# Patient Record
Sex: Female | Born: 1955
Health system: Southern US, Community
[De-identification: ages and names within clinical notes are randomized; demographics above are authoritative.]

## PROBLEM LIST (undated history)

## (undated) DIAGNOSIS — F32A Depression, unspecified: Secondary | ICD-10-CM

## (undated) DIAGNOSIS — R011 Cardiac murmur, unspecified: Secondary | ICD-10-CM

## (undated) DIAGNOSIS — E079 Disorder of thyroid, unspecified: Secondary | ICD-10-CM

## (undated) DIAGNOSIS — K219 Gastro-esophageal reflux disease without esophagitis: Secondary | ICD-10-CM

## (undated) DIAGNOSIS — I10 Essential (primary) hypertension: Secondary | ICD-10-CM

## (undated) DIAGNOSIS — E119 Type 2 diabetes mellitus without complications: Secondary | ICD-10-CM

## (undated) DIAGNOSIS — T7840XA Allergy, unspecified, initial encounter: Secondary | ICD-10-CM

## (undated) DIAGNOSIS — E785 Hyperlipidemia, unspecified: Secondary | ICD-10-CM

## (undated) DIAGNOSIS — F329 Major depressive disorder, single episode, unspecified: Secondary | ICD-10-CM

## (undated) HISTORY — DX: Major depressive disorder, single episode, unspecified: F32.9

## (undated) HISTORY — DX: Cardiac murmur, unspecified: R01.1

## (undated) HISTORY — PX: NO PAST SURGERIES: SHX2092

## (undated) HISTORY — DX: Hyperlipidemia, unspecified: E78.5

## (undated) HISTORY — DX: Allergy, unspecified, initial encounter: T78.40XA

## (undated) HISTORY — DX: Gastro-esophageal reflux disease without esophagitis: K21.9

## (undated) HISTORY — DX: Depression, unspecified: F32.A

---

## 2002-02-21 ENCOUNTER — Encounter (INDEPENDENT_AMBULATORY_CARE_PROVIDER_SITE_OTHER): Payer: Self-pay | Admitting: *Deleted

## 2004-10-01 ENCOUNTER — Emergency Department (HOSPITAL_COMMUNITY): Admission: EM | Admit: 2004-10-01 | Discharge: 2004-10-01 | Payer: Self-pay | Admitting: Family Medicine

## 2004-10-04 ENCOUNTER — Ambulatory Visit: Payer: Self-pay | Admitting: Family Medicine

## 2004-11-17 ENCOUNTER — Ambulatory Visit: Payer: Self-pay | Admitting: Family Medicine

## 2006-04-20 DIAGNOSIS — E119 Type 2 diabetes mellitus without complications: Secondary | ICD-10-CM

## 2006-04-20 DIAGNOSIS — I10 Essential (primary) hypertension: Secondary | ICD-10-CM | POA: Insufficient documentation

## 2006-04-21 ENCOUNTER — Encounter (INDEPENDENT_AMBULATORY_CARE_PROVIDER_SITE_OTHER): Payer: Self-pay | Admitting: *Deleted

## 2007-10-26 ENCOUNTER — Ambulatory Visit: Payer: Self-pay | Admitting: Nurse Practitioner

## 2007-10-26 DIAGNOSIS — E7800 Pure hypercholesterolemia, unspecified: Secondary | ICD-10-CM | POA: Insufficient documentation

## 2007-10-26 DIAGNOSIS — E039 Hypothyroidism, unspecified: Secondary | ICD-10-CM

## 2007-10-26 DIAGNOSIS — R011 Cardiac murmur, unspecified: Secondary | ICD-10-CM | POA: Insufficient documentation

## 2007-10-26 LAB — CONVERTED CEMR LAB
Bilirubin Urine: NEGATIVE
Blood Glucose, Fingerstick: 243
Blood in Urine, dipstick: NEGATIVE
HDL goal, serum: 40 mg/dL
Ketones, urine, test strip: NEGATIVE
Microalb, Ur: 0.21 mg/dL (ref 0.00–1.89)
Protein, U semiquant: NEGATIVE
Specific Gravity, Urine: <1.005
Urobilinogen, UA: 0.2
WBC Urine, dipstick: NEGATIVE
pH: 7

## 2007-10-30 LAB — CONVERTED CEMR LAB
AST: 32 units/L (ref 0–37)
Albumin: 4.7 g/dL (ref 3.5–5.2)
Alkaline Phosphatase: 149 units/L — ABNORMAL HIGH (ref 39–117)
BUN: 15 mg/dL (ref 6–23)
Basophils Absolute: 0 10*3/uL (ref 0.0–0.1)
Basophils Relative: 0 % (ref 0–1)
Calcium: 9.9 mg/dL (ref 8.4–10.5)
Creatinine, Ser: 0.82 mg/dL (ref 0.40–1.20)
Hgb A1c MFr Bld: 8.9 % — ABNORMAL HIGH (ref 4.6–6.1)
Lymphocytes Relative: 27 % (ref 12–46)
MCHC: 34.7 g/dL (ref 30.0–36.0)
Monocytes Absolute: 0.5 10*3/uL (ref 0.1–1.0)
Neutro Abs: 7.2 10*3/uL (ref 1.7–7.7)
Neutrophils Relative %: 66 % (ref 43–77)
RBC: 4.78 M/uL (ref 3.87–5.11)
RDW: 13.4 % (ref 11.5–15.5)
TSH: 18.726 microintl units/mL — ABNORMAL HIGH (ref 0.350–4.50)
Total Bilirubin: 0.5 mg/dL (ref 0.3–1.2)
Total Protein: 8.5 g/dL — ABNORMAL HIGH (ref 6.0–8.3)
WBC: 10.9 10*3/uL — ABNORMAL HIGH (ref 4.0–10.5)

## 2007-11-05 ENCOUNTER — Ambulatory Visit: Payer: Self-pay | Admitting: *Deleted

## 2007-11-21 ENCOUNTER — Ambulatory Visit: Payer: Self-pay | Admitting: Nurse Practitioner

## 2007-11-21 DIAGNOSIS — R21 Rash and other nonspecific skin eruption: Secondary | ICD-10-CM

## 2007-11-21 LAB — CONVERTED CEMR LAB: Blood Glucose, Fingerstick: 246

## 2007-12-24 ENCOUNTER — Ambulatory Visit: Payer: Self-pay | Admitting: Nurse Practitioner

## 2007-12-25 ENCOUNTER — Ambulatory Visit: Payer: Self-pay | Admitting: Nurse Practitioner

## 2007-12-25 LAB — CONVERTED CEMR LAB
LDL Cholesterol: 91 mg/dL (ref 0–99)
Triglycerides: 167 mg/dL — ABNORMAL HIGH (ref <150)
VLDL: 33 mg/dL (ref 0–40)

## 2008-01-01 ENCOUNTER — Encounter (INDEPENDENT_AMBULATORY_CARE_PROVIDER_SITE_OTHER): Payer: Self-pay | Admitting: Ophthalmology

## 2008-01-01 ENCOUNTER — Ambulatory Visit: Admission: RE | Admit: 2008-01-01 | Discharge: 2008-01-01 | Payer: Self-pay | Admitting: Family Medicine

## 2008-01-08 ENCOUNTER — Telehealth (INDEPENDENT_AMBULATORY_CARE_PROVIDER_SITE_OTHER): Payer: Self-pay | Admitting: Nurse Practitioner

## 2008-01-27 ENCOUNTER — Encounter (INDEPENDENT_AMBULATORY_CARE_PROVIDER_SITE_OTHER): Payer: Self-pay | Admitting: Nurse Practitioner

## 2008-01-27 LAB — CONVERTED CEMR LAB

## 2008-02-07 ENCOUNTER — Encounter (INDEPENDENT_AMBULATORY_CARE_PROVIDER_SITE_OTHER): Payer: Self-pay | Admitting: *Deleted

## 2008-02-26 ENCOUNTER — Ambulatory Visit: Payer: Self-pay | Admitting: Nurse Practitioner

## 2008-02-26 DIAGNOSIS — N3000 Acute cystitis without hematuria: Secondary | ICD-10-CM | POA: Insufficient documentation

## 2008-02-26 LAB — CONVERTED CEMR LAB
Bilirubin Urine: NEGATIVE
Blood Glucose, Fingerstick: 168
Glucose, Urine, Semiquant: NEGATIVE
Hgb A1c MFr Bld: 6.3 %
Ketones, urine, test strip: NEGATIVE
Protein, U semiquant: NEGATIVE

## 2008-02-27 ENCOUNTER — Encounter (INDEPENDENT_AMBULATORY_CARE_PROVIDER_SITE_OTHER): Payer: Self-pay | Admitting: Nurse Practitioner

## 2008-02-27 LAB — CONVERTED CEMR LAB
ALT: 16 units/L (ref 0–35)
AST: 19 units/L (ref 0–37)
HDL: 51 mg/dL (ref >39)
LDL Cholesterol: 95 mg/dL (ref 0–99)
TSH: 3.947 microintl units/mL (ref 0.350–4.50)
Total Bilirubin: 0.5 mg/dL (ref 0.3–1.2)
Total CHOL/HDL Ratio: 4.2
Triglycerides: 333 mg/dL — ABNORMAL HIGH (ref <150)
VLDL: 67 mg/dL — ABNORMAL HIGH (ref 0–40)

## 2008-02-29 ENCOUNTER — Telehealth (INDEPENDENT_AMBULATORY_CARE_PROVIDER_SITE_OTHER): Payer: Self-pay | Admitting: Nurse Practitioner

## 2008-03-10 ENCOUNTER — Ambulatory Visit: Payer: Self-pay | Admitting: Nurse Practitioner

## 2008-05-12 ENCOUNTER — Telehealth (INDEPENDENT_AMBULATORY_CARE_PROVIDER_SITE_OTHER): Payer: Self-pay | Admitting: Nurse Practitioner

## 2008-05-26 ENCOUNTER — Ambulatory Visit: Payer: Self-pay | Admitting: Nurse Practitioner

## 2008-05-26 LAB — CONVERTED CEMR LAB
Blood Glucose, Fingerstick: 159
Hgb A1c MFr Bld: 6.5 %

## 2008-06-06 ENCOUNTER — Ambulatory Visit (HOSPITAL_COMMUNITY): Admission: RE | Admit: 2008-06-06 | Discharge: 2008-06-06 | Payer: Self-pay | Admitting: Family Medicine

## 2008-07-07 ENCOUNTER — Ambulatory Visit: Payer: Self-pay | Admitting: Nurse Practitioner

## 2008-07-07 LAB — CONVERTED CEMR LAB
ALT: 9 units/L (ref 0–35)
Cholesterol: 144 mg/dL (ref 0–200)
Indirect Bilirubin: 0.3 mg/dL (ref 0.0–0.9)
LDL Cholesterol: 70 mg/dL (ref 0–99)
Total Bilirubin: 0.4 mg/dL (ref 0.3–1.2)
Total CHOL/HDL Ratio: 3
VLDL: 26 mg/dL (ref 0–40)

## 2008-07-08 ENCOUNTER — Encounter (INDEPENDENT_AMBULATORY_CARE_PROVIDER_SITE_OTHER): Payer: Self-pay | Admitting: Nurse Practitioner

## 2008-07-25 ENCOUNTER — Telehealth (INDEPENDENT_AMBULATORY_CARE_PROVIDER_SITE_OTHER): Payer: Self-pay | Admitting: Nurse Practitioner

## 2008-08-21 ENCOUNTER — Ambulatory Visit: Payer: Self-pay | Admitting: Nurse Practitioner

## 2008-11-03 ENCOUNTER — Telehealth (INDEPENDENT_AMBULATORY_CARE_PROVIDER_SITE_OTHER): Payer: Self-pay | Admitting: Nurse Practitioner

## 2008-12-01 ENCOUNTER — Encounter (INDEPENDENT_AMBULATORY_CARE_PROVIDER_SITE_OTHER): Payer: Self-pay | Admitting: *Deleted

## 2008-12-25 ENCOUNTER — Ambulatory Visit: Payer: Self-pay | Admitting: Nurse Practitioner

## 2008-12-25 DIAGNOSIS — E669 Obesity, unspecified: Secondary | ICD-10-CM | POA: Insufficient documentation

## 2008-12-25 LAB — CONVERTED CEMR LAB
ALT: 15 units/L (ref 0–35)
AST: 20 units/L (ref 0–37)
Albumin: 5 g/dL (ref 3.5–5.2)
BUN: 10 mg/dL (ref 6–23)
Blood Glucose, Fingerstick: 152
CO2: 23 meq/L (ref 19–32)
Cholesterol: 181 mg/dL (ref 0–200)
Creatinine, Ser: 0.57 mg/dL (ref 0.40–1.20)
Glucose, Bld: 133 mg/dL — ABNORMAL HIGH (ref 70–99)
Hgb A1c MFr Bld: 6.7 %
LDL Cholesterol: 80 mg/dL (ref 0–99)
Sodium: 142 meq/L (ref 135–145)
Total Bilirubin: 0.6 mg/dL (ref 0.3–1.2)
Total CHOL/HDL Ratio: 3.9
VLDL: 55 mg/dL — ABNORMAL HIGH (ref 0–40)

## 2008-12-26 ENCOUNTER — Encounter (INDEPENDENT_AMBULATORY_CARE_PROVIDER_SITE_OTHER): Payer: Self-pay | Admitting: Nurse Practitioner

## 2009-03-13 ENCOUNTER — Ambulatory Visit: Payer: Self-pay | Admitting: Nurse Practitioner

## 2009-03-27 ENCOUNTER — Ambulatory Visit: Payer: Self-pay | Admitting: Nurse Practitioner

## 2009-03-27 LAB — CONVERTED CEMR LAB
Basophils Absolute: 0 10*3/uL (ref 0.0–0.1)
Basophils Relative: 0 % (ref 0–1)
Blood Glucose, Fingerstick: 154
Chlamydia, DNA Probe: NEGATIVE
Eosinophils Absolute: 0.1 10*3/uL (ref 0.0–0.7)
Eosinophils Relative: 1 % (ref 0–5)
Glucose, Urine, Semiquant: NEGATIVE
HCT: 42.3 % (ref 36.0–46.0)
HDL: 46 mg/dL (ref >39)
Hgb A1c MFr Bld: 7.2 %
Ketones, urine, test strip: NEGATIVE
LDL Cholesterol: 82 mg/dL (ref 0–99)
Lymphocytes Relative: 25 % (ref 12–46)
Lymphs Abs: 1.9 10*3/uL (ref 0.7–4.0)
MCHC: 33.8 g/dL (ref 30.0–36.0)
MCV: 88.5 fL (ref 78.0–100.0)
Monocytes Relative: 4 % (ref 3–12)
Neutrophils Relative %: 69 % (ref 43–77)
Nitrite: NEGATIVE
Triglycerides: 159 mg/dL — ABNORMAL HIGH (ref <150)
VLDL: 32 mg/dL (ref 0–40)

## 2009-04-01 ENCOUNTER — Encounter (INDEPENDENT_AMBULATORY_CARE_PROVIDER_SITE_OTHER): Payer: Self-pay | Admitting: Nurse Practitioner

## 2009-05-29 ENCOUNTER — Ambulatory Visit: Payer: Self-pay | Admitting: Nurse Practitioner

## 2009-05-29 DIAGNOSIS — J309 Allergic rhinitis, unspecified: Secondary | ICD-10-CM | POA: Insufficient documentation

## 2009-06-08 ENCOUNTER — Ambulatory Visit (HOSPITAL_COMMUNITY): Admission: RE | Admit: 2009-06-08 | Discharge: 2009-06-08 | Payer: Self-pay | Admitting: Internal Medicine

## 2009-07-31 ENCOUNTER — Ambulatory Visit: Payer: Self-pay | Admitting: Nurse Practitioner

## 2009-09-07 ENCOUNTER — Telehealth (INDEPENDENT_AMBULATORY_CARE_PROVIDER_SITE_OTHER): Payer: Self-pay | Admitting: Nurse Practitioner

## 2009-10-08 ENCOUNTER — Telehealth (INDEPENDENT_AMBULATORY_CARE_PROVIDER_SITE_OTHER): Payer: Self-pay | Admitting: Nurse Practitioner

## 2009-11-06 ENCOUNTER — Ambulatory Visit: Payer: Self-pay | Admitting: Nurse Practitioner

## 2009-11-06 LAB — CONVERTED CEMR LAB
Blood Glucose, Fingerstick: 120
Microalb, Ur: 0.5 mg/dL (ref 0.00–1.89)
Nitrite: NEGATIVE
Protein, U semiquant: NEGATIVE
Specific Gravity, Urine: 1.01
Urobilinogen, UA: 0.2
WBC Urine, dipstick: NEGATIVE
pH: 6.5

## 2009-12-08 ENCOUNTER — Ambulatory Visit: Payer: Self-pay | Admitting: Nurse Practitioner

## 2010-02-05 ENCOUNTER — Telehealth (INDEPENDENT_AMBULATORY_CARE_PROVIDER_SITE_OTHER): Payer: Self-pay | Admitting: Nurse Practitioner

## 2010-02-10 ENCOUNTER — Ambulatory Visit: Payer: Self-pay | Admitting: Nurse Practitioner

## 2010-02-10 ENCOUNTER — Encounter (INDEPENDENT_AMBULATORY_CARE_PROVIDER_SITE_OTHER): Payer: Self-pay | Admitting: Nurse Practitioner

## 2010-02-11 ENCOUNTER — Encounter (INDEPENDENT_AMBULATORY_CARE_PROVIDER_SITE_OTHER): Payer: Self-pay | Admitting: Nurse Practitioner

## 2010-02-11 LAB — CONVERTED CEMR LAB
Albumin: 4.7 g/dL (ref 3.5–5.2)
BUN: 15 mg/dL (ref 6–23)
Basophils Absolute: 0 10*3/uL (ref 0.0–0.1)
Basophils Relative: 0 % (ref 0–1)
Cholesterol: 179 mg/dL (ref 0–200)
Eosinophils Absolute: 0.2 10*3/uL (ref 0.0–0.7)
HDL: 50 mg/dL (ref >39)
Lymphocytes Relative: 32 % (ref 12–46)
MCV: 89.7 fL (ref 78.0–100.0)
Monocytes Absolute: 0.3 10*3/uL (ref 0.1–1.0)
Monocytes Relative: 5 % (ref 3–12)
Neutro Abs: 4.6 10*3/uL (ref 1.7–7.7)
Platelets: 222 10*3/uL (ref 150–400)
RDW: 13.3 % (ref 11.5–15.5)
Sodium: 139 meq/L (ref 135–145)
TSH: 1.585 microintl units/mL (ref 0.350–4.500)
Total Protein: 8 g/dL (ref 6.0–8.3)
Triglycerides: 203 mg/dL — ABNORMAL HIGH (ref <150)

## 2010-03-23 NOTE — Letter (Signed)
Summary: Handout Printed  Printed Handout:  - Eczema (Atopic Dermatitis) 

## 2010-03-23 NOTE — Assessment & Plan Note (Signed)
Summary: Diabetes/HTN   Vital Signs:  Patient profile:   55 year old female Menstrual status:  postmenopausal Weight:      169.0 pounds BMI:     34.26 BSA:     1.72 Temp:     97.3 degrees F oral Pulse rate:   91 / minute Pulse rhythm:   regular Resp:     20 per minute BP sitting:   126 / 85  (left arm) Cuff size:   regular  Vitals Entered By: Levon Hedger (March 13, 2009 3:45 PM) CC: needs refill on cholesterol medication....rash on left arm, neck x 3-4 days has been using some ointment but not sure if that is the right medication, Lipid Management, Hypertension Management Is Patient Diabetic? Yes CBG Result 143 CBG Device ID A  Does patient need assistance? Functional Status Self care Ambulation Normal   CC:  needs refill on cholesterol medication....rash on left arm, neck x 3-4 days has been using some ointment but not sure if that is the right medication, Lipid Management, and Hypertension Management.  History of Present Illness:  Pt into the office for follow up.  Rash - present on left inner elbow and neck. Started 3 days ago she has been using some mupironcin ointment (a friend's) +puritis Denies any irritants such as change in soap, detergents   No medications present today with pt.  Advised pt to bring all meds to this office on each visit  Spanish interpreter present in office with pt  Hypertension History:      She denies headache, chest pain, and palpitations.  She notes no problems with any antihypertensive medication side effects.        Positive major cardiovascular risk factors include diabetes, hyperlipidemia, and hypertension.  Negative major cardiovascular risk factors include female age less than 22 years old, negative family history for ischemic heart disease, and non-tobacco-user status.        Further assessment for target organ damage reveals no history of ASHD, cardiac end-organ damage (CHF/LVH), stroke/TIA, peripheral vascular disease,  renal insufficiency, or hypertensive retinopathy.    Lipid Management History:      Positive NCEP/ATP III risk factors include diabetes and hypertension.  Negative NCEP/ATP III risk factors include female age less than 25 years old, no family history for ischemic heart disease, non-tobacco-user status, no ASHD (atherosclerotic heart disease), no prior stroke/TIA, no peripheral vascular disease, and no history of aortic aneurysm.        The patient states that she knows about the "Therapeutic Lifestyle Change" diet.  Her compliance with the TLC diet is fair.  The patient does not know about adjunctive measures for cholesterol lowering.  She expresses no side effects from her lipid-lowering medication.  The patient denies any symptoms to suggest myopathy or liver disease.      Allergies: No Known Drug Allergies  Review of Systems ENT:  Denies earache. CV:  Denies chest pain or discomfort. Resp:  Denies cough. GI:  Denies abdominal pain, nausea, and vomiting. Derm:  Complains of rash; left inner arm  and right neck.  Physical Exam  General:  alert.   Head:  normocephalic.   Msk:  up to the exam table Neurologic:  alert & oriented X3.   Skin:  right inner arm, right neck with raised erythematous papules Psych:  Oriented X3.     Impression & Recommendations:  Problem # 1:  DIABETES MELLITUS II, UNCOMPLICATED (ICD-250.00)  Her updated medication list for this problem includes:  Metformin Hcl 500 Mg Tabs (Metformin hcl) ..... One tablet by mouth two times a day    Lisinopril-hydrochlorothiazide 10-12.5 Mg Tabs (Lisinopril-hydrochlorothiazide) ..... One tablet by mouth daily for blood pressure    Aspirin Low Strength 81 Mg Chew (Aspirin) ..... One tablet by mouth daily  Orders: Capillary Blood Glucose/CBG (29562)  Problem # 2:  HYPERTENSION, BENIGN SYSTEMIC (ICD-401.1)  Her updated medication list for this problem includes:    Lisinopril-hydrochlorothiazide 10-12.5 Mg Tabs  (Lisinopril-hydrochlorothiazide) ..... One tablet by mouth daily for blood pressure  Problem # 3:  HYPOTHYROIDISM (ICD-244.9)  Her updated medication list for this problem includes:    Levothroid 125 Mcg Tabs (Levothyroxine sodium) .Marland Kitchen... 1 tablet by mouth daily for thyroid  Problem # 4:  SKIN RASH (ICD-782.1)  Her updated medication list for this problem includes:    Triamcinolone Acetonide 0.1 % Oint (Triamcinolone acetonide) .Marland Kitchen... 1 application topically to affected area  Complete Medication List: 1)  Metformin Hcl 500 Mg Tabs (Metformin hcl) .... One tablet by mouth two times a day 2)  Levothroid 125 Mcg Tabs (Levothyroxine sodium) .Marland Kitchen.. 1 tablet by mouth daily for thyroid 3)  Lisinopril-hydrochlorothiazide 10-12.5 Mg Tabs (Lisinopril-hydrochlorothiazide) .... One tablet by mouth daily for blood pressure 4)  Glucometer Elite Classic Kit (Blood glucose monitoring suppl) .... Dispense meter, test strips and lancets check blood sugar daily dx:250.00 5)  Triamcinolone Acetonide 0.1 % Oint (Triamcinolone acetonide) .Marland Kitchen.. 1 application topically to affected area 6)  Pravastatin Sodium 40 Mg Tabs (Pravastatin sodium) .Marland Kitchen.. 1 tablet by mouth nightly 7)  Sidekick Blood Glucose System Devi (Blood gluc meter disp-strips) .... Dispense test strips to use with current glucometer check blood sugar daily before breakfast  dx 250.00 8)  Calcium 600+d 600-400 Mg-unit Tabs (Calcium carbonate-vitamin d) .Marland Kitchen.. 1 tablet by mouth two times a day 9)  Blood Glucose Test Strp (Glucose blood) .... Use with glucometer elite to check blood sugar once daily dx:250.00 10)  Aspirin Low Strength 81 Mg Chew (Aspirin) .... One tablet by mouth daily  Hypertension Assessment/Plan:      The patient's hypertensive risk group is category C: Target organ damage and/or diabetes.  Her calculated 10 year risk of coronary heart disease is 11 %.  Today's blood pressure is 126/85.  Her blood pressure goal is < 130/80.  Lipid  Assessment/Plan:      Based on NCEP/ATP III, the patient's risk factor category is "history of diabetes".  The patient's lipid goals are as follows: Total cholesterol goal is 200; LDL cholesterol goal is 100; HDL cholesterol goal is 40; Triglyceride goal is 150.    Patient Instructions: 1)  Keep your appointment for a complete physical exam. 2)  Do not eat after midnight before this visit. Prescriptions: PRAVASTATIN SODIUM 40 MG TABS (PRAVASTATIN SODIUM) 1 tablet by mouth nightly  #30 x 6   Entered and Authorized by:   Lehman Prom FNP   Signed by:   Lehman Prom FNP on 03/13/2009   Method used:   Print then Give to Patient   RxID:   [id number] LISINOPRIL-HYDROCHLOROTHIAZIDE 10-12.5 MG TABS (LISINOPRIL-HYDROCHLOROTHIAZIDE) One tablet by mouth daily for blood pressure  #30 x 6   Entered and Authorized by:   Lehman Prom FNP   Signed by:   Lehman Prom FNP on 03/13/2009   Method used:   Print then Give to Patient   RxID:   [id number] LEVOTHROID 125 MCG TABS (LEVOTHYROXINE SODIUM) 1 tablet by mouth daily for thyroid  #30 Each x 6  Entered and Authorized by:   Lehman Prom FNP   Signed by:   Lehman Prom FNP on 03/13/2009   Method used:   Print then Give to Patient   RxID:   [id number] METFORMIN HCL 500 MG TABS (METFORMIN HCL) One tablet by mouth two times a day  #60 Each x 6   Entered and Authorized by:   Lehman Prom FNP   Signed by:   Lehman Prom FNP on 03/13/2009   Method used:   Print then Give to Patient   RxID:   [id number] TRIAMCINOLONE ACETONIDE 0.1 % OINT (TRIAMCINOLONE ACETONIDE) 1 application topically to affected area  #60gm x 0   Entered and Authorized by:   Lehman Prom FNP   Signed by:   Lehman Prom FNP on 03/13/2009   Method used:   Print then Give to Patient   RxID:   [id number]   Diabetic Foot Exam Foot Inspection Is there a history of a foot ulcer?              No Is there a foot ulcer now?               No Can the patient see the bottom of their feet?          Yes Are the shoes appropriate in style and fit?          Yes Is there swelling or an abnormal foot shape?          No Are the toenails long?                No Are the toenails thick?                No Are the toenails ingrown?              No Is there heavy callous build-up?              No Is there pain in the calf muscle (Intermittent claudication) when walking?    NoIs there a claw toe deformity?              No Is there elevated skin temperature?            No Is there limited ankle dorsiflexion?            No Is there foot or ankle muscle weakness?            No  Diabetic Foot Care Education Pulse Check          Right Foot          Left Foot Dorsalis Pedis:        normal            normal

## 2010-03-23 NOTE — Assessment & Plan Note (Signed)
Summary: Acute - Allergic Rhinitis   Vital Signs:  Patient profile:   55 year old female Menstrual status:  postmenopausal Weight:      168.9 pounds BMI:     34.24 BSA:     1.72 Temp:     98.0 degrees F oral Pulse rate:   81 / minute Pulse rhythm:   regular Resp:     20 per minute BP sitting:   129 / 83  (left arm) Cuff size:   regular  Vitals Entered By: Levon Hedger (May 29, 2009 11:46 AM) CC: chest congestion, runny nose, URI symptoms  Does patient need assistance? Functional Status Self care Ambulation Normal   CC:  chest congestion, runny nose, and URI symptoms.  History of Present Illness:  Pt into the office with complaints of upper respiratory symptoms       This is a 55 year old woman who presents with URI symptoms.  The symptoms began 4 days ago.  The patient complains of nasal congestion and dry cough, but denies sore throat and earache.  The patient denies fever.  The patient denies sneezing and headache.    Son present with pt today and reports that he had similar symptoms on last week  Mammogram due - pt is requesting to get this scheduled today  Allergies: No Known Drug Allergies  Review of Systems General:  Denies fever. ENT:  Complains of nasal congestion. CV:  Denies chest pain or discomfort. Resp:  Complains of cough.  Physical Exam  General:  alert.   Head:  normocephalic.   Eyes:  injected conjunctiva Ears:  left ear with moderate cerumen right ear with able to visualize bony landmarkds Nose:  inflammed turbinates Mouth:  fair dentition.   Lungs:  normal breath sounds.   Heart:  normal rate and regular rhythm.   Msk:  up to the exam table Neurologic:  alert & oriented X3.   Skin:  color normal.   Psych:  Oriented X3.     Impression & Recommendations:  Problem # 1:  ALLERGIC RHINITIS (ICD-477.9) advised pt of the dx veramyst sample given to pt will also start loratadine Her updated medication list for this problem  includes:    Loratadine 10 Mg Tabs (Loratadine) ..... One tablet by mouth daily for allergies    Veramyst 27.5 Mcg/spray Susp (Fluticasone furoate) ..... One spray in each nostril two times a day  Problem # 2:  UNSPECIFIED BREAST SCREENING (ICD-V76.10) will order mammogram - no current complaints, just time to schedule routine Orders: Mammogram (Screening) (Mammo)  Complete Medication List: 1)  Metformin Hcl 500 Mg Tabs (Metformin hcl) .... One tablet by mouth two times a day 2)  Levothroid 125 Mcg Tabs (Levothyroxine sodium) .Marland Kitchen.. 1 tablet by mouth daily for thyroid 3)  Lisinopril-hydrochlorothiazide 10-12.5 Mg Tabs (Lisinopril-hydrochlorothiazide) .... One tablet by mouth daily for blood pressure 4)  Glucometer Elite Classic Kit (Blood glucose monitoring suppl) .... Dispense meter, test strips and lancets check blood sugar daily dx:250.00 5)  Triamcinolone Acetonide 0.1 % Oint (Triamcinolone acetonide) .Marland Kitchen.. 1 application topically to affected area 6)  Pravastatin Sodium 40 Mg Tabs (Pravastatin sodium) .Marland Kitchen.. 1 tablet by mouth nightly 7)  Sidekick Blood Glucose System Devi (Blood gluc meter disp-strips) .... Dispense test strips to use with current glucometer check blood sugar daily before breakfast  dx 250.00 8)  Calcium 600+d 600-400 Mg-unit Tabs (Calcium carbonate-vitamin d) .Marland Kitchen.. 1 tablet by mouth two times a day 9)  Blood Glucose Test  Strp (Glucose blood) .... Use with glucometer elite to check blood sugar once daily dx:250.00 10)  Aspirin Low Strength 81 Mg Chew (Aspirin) .... One tablet by mouth daily 11)  Loratadine 10 Mg Tabs (Loratadine) .... One tablet by mouth daily for allergies 12)  Veramyst 27.5 Mcg/spray Susp (Fluticasone furoate) .... One spray in each nostril two times a day  Patient Instructions: 1)  Use the nasal spray in each nostril two times a day (hold your head down) 2)  Loratadine 10mg  - one tablet by mouth daily (you can get from [facility]) 3)  Follow up at next  scheduled visit Prescriptions: VERAMYST 27.5 MCG/SPRAY SUSP (FLUTICASONE FUROATE) One spray in each nostril two times a day  #1 x 0   Entered and Authorized by:   Lehman Prom FNP   Signed by:   Lehman Prom FNP on 05/29/2009   Method used:   Samples Given   RxID:   [id number] LORATADINE 10 MG TABS (LORATADINE) One tablet by mouth daily for allergies  #30 x 5   Entered and Authorized by:   Lehman Prom FNP   Signed by:   Lehman Prom FNP on 05/29/2009   Method used:   Print then Give to Patient   RxID:   [id number]

## 2010-03-23 NOTE — Progress Notes (Signed)
Summary: strips refills   Phone Note Call from Patient Call back at [facility] Phone [phone]   Summary of Call: The pt needs more strips to check the sugar level. [facility]  Roel Cluck FnP Initial call taken by: Manon Hilding,  September 07, 2009 10:28 AM  Follow-up for Phone Call        Spoke with pt. daughter and advised of available refills at [facility] and phone #.   Follow-up by: Dutch Quint RN,  September 07, 2009 12:30 PM

## 2010-03-23 NOTE — Assessment & Plan Note (Signed)
Summary: Diabetes   Vital Signs:  Patient profile:   55 year old female Menstrual status:  postmenopausal Weight:      165.8 pounds BMI:     33.61 Temp:     98.2 degrees F oral Pulse rate:   78 / minute Pulse rhythm:   regular Resp:     20 per minute BP sitting:   118 / 80  (left arm) Cuff size:   regular  Vitals Entered By: Levon Hedger (July 31, 2009 12:38 PM) CC: follow-up visit...rash on legs and arms x 4 days , Hypertension Management, Lipid Management, Rash Is Patient Diabetic? Yes Pain Assessment Patient in pain? no      CBG Result 158 CBG Device ID B  Does patient need assistance? Functional Status Self care Ambulation Normal   CC:  follow-up visit...rash on legs and arms x 4 days , Hypertension Management, Lipid Management, and Rash.  History of Present Illness:  Pt into the office for 3 month follow up on diabetes  Obesity - down 3 pounds since last visit  Rash      This is a 55 year old woman who presents with Rash.  The symptoms began 4 days ago.  The severity is described as mild.  The patient complains of hives, but denies papules.  The rash is located on the right forearm, left arm, and right leg.  The rash is worse with heat.  The patient denies the following symptoms: headache.  Pt has been using triamcinolone as previously ordered  Diabetes Management History:      The patient is a 55 years old female who comes in for evaluation of Type 2 Diabetes Mellitus.  She has not been enrolled in the "Diabetic Education Program".  She states understanding of dietary principles and is following her diet appropriately.  No sensory loss is reported.  Self foot exams are not being performed.  She is checking home blood sugars.  She says that she is exercising.        Hypoglycemic symptoms are not occurring.  No hyperglycemic symptoms are reported.  Other comments include: Pt is checking blood sugar everyother morning before breakfast.        No changes have been  made to her treatment plan since last visit.  Treatment plan changes were none and initiated by patient.    Hypertension History:      She denies headache, chest pain, and palpitations.        Positive major cardiovascular risk factors include diabetes, hyperlipidemia, and hypertension.  Negative major cardiovascular risk factors include female age less than 77 years old, negative family history for ischemic heart disease, and non-tobacco-user status.        Further assessment for target organ damage reveals no history of ASHD, cardiac end-organ damage (CHF/LVH), stroke/TIA, peripheral vascular disease, renal insufficiency, or hypertensive retinopathy.    Lipid Management History:      Positive NCEP/ATP III risk factors include diabetes and hypertension.  Negative NCEP/ATP III risk factors include female age less than 66 years old, no family history for ischemic heart disease, non-tobacco-user status, no ASHD (atherosclerotic heart disease), no prior stroke/TIA, no peripheral vascular disease, and no history of aortic aneurysm.        The patient states that she knows about the "Therapeutic Lifestyle Change" diet.  Her compliance with the TLC diet is fair.  The patient does not know about adjunctive measures for cholesterol lowering.  Adjunctive measures started by the patient include  ASA.  She expresses no side effects from her lipid-lowering medication.  Comments include: Pt is taking meds as ordered.  The patient denies any symptoms to suggest myopathy or liver disease.     Habits & Providers  Alcohol-Tobacco-Diet     Alcohol drinks/day: 0     Tobacco Status: never  Exercise-Depression-Behavior     Does Patient Exercise: yes     Exercise Counseling: not indicated; exercise is adequate     Have you felt down or hopeless? no     Have you felt little pleasure in things? no     Depression Counseling: not indicated; screening negative for depression     Drug Use: no     Seat Belt Use: 100      Sun Exposure: occasionally  Allergies (verified): No Known Drug Allergies  Review of Systems CV:  Denies chest pain or discomfort. Resp:  Denies cough. GI:  Denies abdominal pain, nausea, and vomiting. Derm:  Complains of rash.  Physical Exam  General:  alert.   Head:  normocephalic.   Lungs:  normal breath sounds.   Heart:  normal rate and regular rhythm.   Abdomen:  normal bowel sounds.   Msk:  up to the exam table Neurologic:  alert & oriented X3.   Skin:  right forearm, left forearm, right leg with raised, well demarcated erythema Psych:  Oriented X3.    Diabetes Management Exam:       Nails:          Left foot: normal          Right foot: normal   Impression & Recommendations:  Problem # 1:  DIABETES MELLITUS II, UNCOMPLICATED (ICD-250.00) Hbga1c=7.1 today  advised pt she is almost at goal. will keep meds the same Her updated medication list for this problem includes:    Metformin Hcl 500 Mg Tabs (Metformin hcl) ..... One tablet by mouth two times a day    Lisinopril-hydrochlorothiazide 10-12.5 Mg Tabs (Lisinopril-hydrochlorothiazide) ..... One tablet by mouth daily for blood pressure    Aspirin Low Strength 81 Mg Chew (Aspirin) ..... One tablet by mouth daily  Orders: Capillary Blood Glucose/CBG (82948) Hemoglobin A1C (83036)  Problem # 2:  HYPERTENSION, BENIGN SYSTEMIC (ICD-401.1) BP is doing great Reviewed DASH diet Her updated medication list for this problem includes:    Lisinopril-hydrochlorothiazide 10-12.5 Mg Tabs (Lisinopril-hydrochlorothiazide) ..... One tablet by mouth daily for blood pressure  Problem # 3:  HYPERCHOLESTEROLEMIA (ICD-272.0) will check labs on next visit Her updated medication list for this problem includes:    Pravastatin Sodium 40 Mg Tabs (Pravastatin sodium) .Marland Kitchen... 1 tablet by mouth nightly  Problem # 4:  HYPOTHYROIDISM (ICD-244.9) labs ok as per last check Her updated medication list for this problem includes:    Levothroid  125 Mcg Tabs (Levothyroxine sodium) .Marland Kitchen... 1 tablet by mouth daily for thyroid  Problem # 5:  SKIN RASH (ICD-782.1) well dermarcated ? allergic rash to arms and right leg Pt reports she has recently started working outside in her flowers advised pt to take loratadine 10mg  by mouth daily may continue to use triamcinolone Her updated medication list for this problem includes:    Triamcinolone Acetonide 0.1 % Oint (Triamcinolone acetonide) .Marland Kitchen... 1 application topically to affected area  Complete Medication List: 1)  Metformin Hcl 500 Mg Tabs (Metformin hcl) .... One tablet by mouth two times a day 2)  Levothroid 125 Mcg Tabs (Levothyroxine sodium) .Marland Kitchen.. 1 tablet by mouth daily for thyroid 3)  Lisinopril-hydrochlorothiazide  10-12.5 Mg Tabs (Lisinopril-hydrochlorothiazide) .... One tablet by mouth daily for blood pressure 4)  Glucometer Elite Classic Kit (Blood glucose monitoring suppl) .... Dispense meter, test strips and lancets check blood sugar daily dx:250.00 5)  Triamcinolone Acetonide 0.1 % Oint (Triamcinolone acetonide) .Marland Kitchen.. 1 application topically to affected area 6)  Pravastatin Sodium 40 Mg Tabs (Pravastatin sodium) .Marland Kitchen.. 1 tablet by mouth nightly 7)  Sidekick Blood Glucose System Devi (Blood gluc meter disp-strips) .... Dispense test strips to use with current glucometer check blood sugar daily before breakfast  dx 250.00 8)  Calcium 600+d 600-400 Mg-unit Tabs (Calcium carbonate-vitamin d) .Marland Kitchen.. 1 tablet by mouth two times a day 9)  Blood Glucose Test Strp (Glucose blood) .... Use with glucometer elite to check blood sugar once daily dx:250.00 10)  Aspirin Low Strength 81 Mg Chew (Aspirin) .... One tablet by mouth daily 11)  Loratadine 10 Mg Tabs (Loratadine) .... One tablet by mouth daily for allergies 12)  Veramyst 27.5 Mcg/spray Susp (Fluticasone furoate) .... One spray in each nostril two times a day  Diabetes Management Assessment/Plan:      The following lipid goals have been  established for the patient: Total cholesterol goal of 200; LDL cholesterol goal of 100; HDL cholesterol goal of 40; Triglyceride goal of 150.  Her blood pressure goal is < 130/80.    Hypertension Assessment/Plan:      The patient's hypertensive risk group is category C: Target organ damage and/or diabetes.  Her calculated 10 year risk of coronary heart disease is 11 %.  Today's blood pressure is 118/80.  Her blood pressure goal is < 130/80.  Lipid Assessment/Plan:      Based on NCEP/ATP III, the patient's risk factor category is "history of diabetes".  The patient's lipid goals are as follows: Total cholesterol goal is 200; LDL cholesterol goal is 100; HDL cholesterol goal is 40; Triglyceride goal is 150.     Patient Instructions: 1)  Follow up in 3 months for diabetes  2)  Rash - you may take allergy medication by mouth daily  3)  may continue to use the cream until healed 4)  Follow up in 3 months for diabetes Prescriptions: BLOOD GLUCOSE TEST  STRP (GLUCOSE BLOOD) Use with glucometer elite to check blood sugar once daily Dx:250.00  #100 x 5   Entered and Authorized by:   Lehman Prom FNP   Signed by:   Lehman Prom FNP on 07/31/2009   Method used:   Print then Give to Patient   RxID:   [id number] LORATADINE 10 MG TABS (LORATADINE) One tablet by mouth daily for allergies  #30 x 5   Entered and Authorized by:   Lehman Prom FNP   Signed by:   Lehman Prom FNP on 07/31/2009   Method used:   Print then Give to Patient   RxID:   [id number]   Laboratory Results   Blood Tests   Date/Time Received: July 31, 2009 1:11 PM   HGBA1C: 7.1%   (Normal Range: Non-Diabetic - 3-6%   Control Diabetic - 6-8%) CBG Random:: 158mg /dL

## 2010-03-23 NOTE — Progress Notes (Signed)
Summary: Medformin Refills   Phone Note Call from Patient Call back at [phone]   Summary of Call: pt needs more refills from Metformin 500 mg. ([address] [phone]) Daphine Deutscher FNP Initial call taken by: Manon Hilding,  October 08, 2009 2:32 PM  Follow-up for Phone Call        Rx sent to [facility] per pt. request -- pt. notified.  Dutch Quint RN  October 08, 2009 3:02 PM     Prescriptions: METFORMIN HCL 500 MG TABS (METFORMIN HCL) One tablet by mouth two times a day  #60 Each x 4   Entered by:   Dutch Quint RN   Authorized by:   Lehman Prom FNP   Signed by:   Dutch Quint RN on 10/08/2009   Method used:   Electronically to        [facility] [id number]* (retail)       [address]       [place], [place]  [address]       Ph: [phone]       Fax: [phone]   RxID:   [id number]

## 2010-03-23 NOTE — Assessment & Plan Note (Signed)
Summary: Diabetes/HTN   Vital Signs:  Patient profile:   55 year old female Menstrual status:  postmenopausal Weight:      159.4 pounds BMI:     32.31 Temp:     97.0 degrees F oral Pulse rate:   78 / minute Pulse rhythm:   regular Resp:     20 per minute BP sitting:   130 / 86  (left arm) Cuff size:   regular  Vitals Entered By: Levon Hedger (November 06, 2009 9:27 AM)  Nutrition Counseling: Patient's BMI is greater than 25 and therefore counseled on weight management options. CC: Lipid Management, Hypertension Management Is Patient Diabetic? Yes Pain Assessment Patient in pain? no      CBG Result 120 CBG Device ID B  Does patient need assistance? Functional Status Self care Ambulation Normal   CC:  Lipid Management and Hypertension Management.  History of Present Illness:  Pt the office for her diabetes f/u.   Obesity - down 6 pounds since her last visit.  She has been walking in the park and is exercising with her family.  Diabetes Management History:      The patient is a 55 years old female who comes in for evaluation of Type 2 Diabetes Mellitus.  She has not been enrolled in the "Diabetic Education Program".  She states understanding of dietary principles and is following her diet appropriately.  No sensory loss is reported.  Self foot exams are not being performed.  She is checking home blood sugars.  She says that she is exercising.        Hypoglycemic symptoms are not occurring.  No hyperglycemic symptoms are reported.        There are no symptoms to suggest diabetic complications.  No changes have been made to her treatment plan since last visit.    Hypertension History:      She denies headache, chest pain, and palpitations.  She notes no problems with any antihypertensive medication side effects.  pt is taking blood pressure medications.        Positive major cardiovascular risk factors include diabetes, hyperlipidemia, and hypertension.  Negative  major cardiovascular risk factors include female age less than 30 years old, negative family history for ischemic heart disease, and non-tobacco-user status.        Further assessment for target organ damage reveals no history of ASHD, cardiac end-organ damage (CHF/LVH), stroke/TIA, peripheral vascular disease, renal insufficiency, or hypertensive retinopathy.    Lipid Management History:      Positive NCEP/ATP III risk factors include diabetes and hypertension.  Negative NCEP/ATP III risk factors include female age less than 47 years old, no family history for ischemic heart disease, non-tobacco-user status, no ASHD (atherosclerotic heart disease), no prior stroke/TIA, no peripheral vascular disease, and no history of aortic aneurysm.        The patient states that she knows about the "Therapeutic Lifestyle Change" diet.  Her compliance with the TLC diet is fair.  Adjunctive measures started by the patient include weight reduction.  She expresses no side effects from her lipid-lowering medication.  The patient denies any symptoms to suggest myopathy or liver disease.       Habits & Providers  Alcohol-Tobacco-Diet     Alcohol drinks/day: 0     Tobacco Status: never  Exercise-Depression-Behavior     Does Patient Exercise: yes     Exercise Counseling: not indicated; exercise is adequate     Have you felt down or hopeless? no  Have you felt little pleasure in things? no     Depression Counseling: not indicated; screening negative for depression     Drug Use: no     Seat Belt Use: 100     Sun Exposure: occasionally  Allergies (verified): No Known Drug Allergies  Review of Systems CV:  Denies chest pain or discomfort. Resp:  Denies cough. GI:  Denies abdominal pain, nausea, and vomiting. Derm:  Complains of itching.  Physical Exam  General:  alert.   Head:  normocephalic.   Lungs:  normal breath sounds.   Heart:  normal rate and regular rhythm.   Abdomen:  normal bowel sounds.    Msk:  normal ROM.   Neurologic:  alert & oriented X3.   Psych:  Oriented X3.    Diabetes Management Exam:       Nails:          Left foot: normal          Right foot: normal   Impression & Recommendations:  Problem # 1:  DIABETES MELLITUS II, UNCOMPLICATED (ICD-250.00) Blood sugar is doing well congrats to pt - continue current meds Her updated medication list for this problem includes:    Metformin Hcl 500 Mg Tabs (Metformin hcl) ..... One tablet by mouth two times a day    Lisinopril-hydrochlorothiazide 10-12.5 Mg Tabs (Lisinopril-hydrochlorothiazide) ..... One tablet by mouth daily for blood pressure    Aspirin Low Strength 81 Mg Chew (Aspirin) ..... One tablet by mouth daily  Orders: Capillary Blood Glucose/CBG (16109) UA Dipstick w/o Micro (manual) (60454) T-Urine Microalbumin w/creat. ratio [phone])  Problem # 2:  HYPERTENSION, BENIGN SYSTEMIC (ICD-401.1) DASH diet reviewed continue current meds Her updated medication list for this problem includes:    Lisinopril-hydrochlorothiazide 10-12.5 Mg Tabs (Lisinopril-hydrochlorothiazide) ..... One tablet by mouth daily for blood pressure  Problem # 3:  HYPERCHOLESTEROLEMIA (ICD-272.0) will check at next visit continue current meds Her updated medication list for this problem includes:    Pravastatin Sodium 40 Mg Tabs (Pravastatin sodium) .Marland Kitchen... 1 tablet by mouth nightly  Problem # 4:  HYPOTHYROIDISM (ICD-244.9) will recheck on next visit Her updated medication list for this problem includes:    Levothroid 125 Mcg Tabs (Levothyroxine sodium) .Marland Kitchen... 1 tablet by mouth daily for thyroid  Problem # 5:  ALLERGIC RHINITIS (ICD-477.9) likely cause of rash in lower extremity  advised pt to start loratadine  Her updated medication list for this problem includes:    Loratadine 10 Mg Tabs (Loratadine) ..... One tablet by mouth daily for allergies    Veramyst 27.5 Mcg/spray Susp (Fluticasone furoate) ..... One spray in each  nostril two times a day  Problem # 6:  OBESITY (ICD-278.00) doing well with weight reduction  congrats to pt efforts  Complete Medication List: 1)  Metformin Hcl 500 Mg Tabs (Metformin hcl) .... One tablet by mouth two times a day 2)  Levothroid 125 Mcg Tabs (Levothyroxine sodium) .Marland Kitchen.. 1 tablet by mouth daily for thyroid 3)  Lisinopril-hydrochlorothiazide 10-12.5 Mg Tabs (Lisinopril-hydrochlorothiazide) .... One tablet by mouth daily for blood pressure 4)  Glucometer Elite Classic Kit (Blood glucose monitoring suppl) .... Dispense meter, test strips and lancets check blood sugar daily dx:250.00 5)  Triamcinolone Acetonide 0.1 % Oint (Triamcinolone acetonide) .Marland Kitchen.. 1 application topically to affected area 6)  Pravastatin Sodium 40 Mg Tabs (Pravastatin sodium) .Marland Kitchen.. 1 tablet by mouth nightly 7)  Sidekick Blood Glucose System Devi (Blood gluc meter disp-strips) .... Dispense test strips to use with current glucometer  check blood sugar daily before breakfast  dx 250.00 8)  Calcium 600+d 600-400 Mg-unit Tabs (Calcium carbonate-vitamin d) .Marland Kitchen.. 1 tablet by mouth two times a day 9)  Blood Glucose Test Strp (Glucose blood) .... Use with glucometer elite to check blood sugar once daily dx:250.00 10)  Aspirin Low Strength 81 Mg Chew (Aspirin) .... One tablet by mouth daily 11)  Loratadine 10 Mg Tabs (Loratadine) .... One tablet by mouth daily for allergies 12)  Veramyst 27.5 Mcg/spray Susp (Fluticasone furoate) .... One spray in each nostril two times a day  Diabetes Management Assessment/Plan:      The following lipid goals have been established for the patient: Total cholesterol goal of 200; LDL cholesterol goal of 100; HDL cholesterol goal of 40; Triglyceride goal of 150.  Her blood pressure goal is < 130/80.    Hypertension Assessment/Plan:      The patient's hypertensive risk group is category C: Target organ damage and/or diabetes.  Her calculated 10 year risk of coronary heart disease is 11 %.   Today's blood pressure is 130/86.  Her blood pressure goal is < 130/80.  Lipid Assessment/Plan:      Based on NCEP/ATP III, the patient's risk factor category is "history of diabetes".  The patient's lipid goals are as follows: Total cholesterol goal is 200; LDL cholesterol goal is 100; HDL cholesterol goal is 40; Triglyceride goal is 150.    Patient Instructions: 1)  Schedule a nurse visit in 4 weeks for flu vaccine 2)  Diabetes - doing well.  Continue current medications. 3)  Rash - rash on legs is likely from something you are allergic to in the park.  Start the loratadine 10mg  by mouth daily 4)  Try to get an eye exam to check your eyes 5)  Schedule follow up appointment with [email or web address] in 3 months for diabetes.  Come fasting for labs - lipids, cbc, cmp, tsh  Diabetic Foot Exam Foot Inspection Is there a history of a foot ulcer?              No Is there a foot ulcer now?              No Can the patient see the bottom of their feet?          No Are the shoes appropriate in style and fit?          Yes Is there swelling or an abnormal foot shape?          No Are the toenails long?                No Are the toenails thick?                No Are the toenails ingrown?              No Is there heavy callous build-up?              No Is there pain in the calf muscle (Intermittent claudication) when walking?    NoIs there a claw toe deformity?              No Is there elevated skin temperature?            No Is there limited ankle dorsiflexion?            No Is there foot or ankle muscle weakness?            No  Diabetic Foot Care  Education Patient educated on appropriate care of diabetic feet.  Pulse Check          Right Foot          Left Foot Dorsalis Pedis:        normal            normal  Prescriptions: LORATADINE 10 MG TABS (LORATADINE) One tablet by mouth daily for allergies  #30 x 5   Entered and Authorized by:   Lehman Prom FNP   Signed by:   Lehman Prom FNP on  11/06/2009   Method used:   Print then Give to Patient   RxID:   [id number] TRIAMCINOLONE ACETONIDE 0.1 % OINT (TRIAMCINOLONE ACETONIDE) 1 application topically to affected area  #30gm x 1   Entered and Authorized by:   Lehman Prom FNP   Signed by:   Lehman Prom FNP on 11/06/2009   Method used:   Print then Give to Patient   RxID:   [id number]   Laboratory Results   Urine Tests  Date/Time Received: November 06, 2009 9:52 AM   Routine Urinalysis   Color: lt. yellow Appearance: Clear Glucose: negative   (Normal Range: Negative) Bilirubin: negative   (Normal Range: Negative) Ketone: negative   (Normal Range: Negative) Spec. Gravity: 1.010   (Normal Range: 1.003-1.035) Blood: negative   (Normal Range: Negative) pH: 6.5   (Normal Range: 5.0-8.0) Protein: negative   (Normal Range: Negative) Urobilinogen: 0.2   (Normal Range: 0-1) Nitrite: negative   (Normal Range: Negative) Leukocyte Esterace: negative   (Normal Range: Negative)     Blood Tests   Date/Time Received: November 06, 2009 9:52 AM   HGBA1C: 6.3%   (Normal Range: Non-Diabetic - 3-6%   Control Diabetic - 6-8%) CBG Random:: 120     Prevention & Chronic Care Immunizations   Influenza vaccine: Fluvax 3+  (12/25/2008)    Tetanus booster: 02/21/2001: Done.    Pneumococcal vaccine: Pneumovax  (10/26/2007)  Colorectal Screening   Hemoccult: Not documented   Hemoccult action/deferral: Ordered  (03/27/2009)   Hemoccult due: 03/27/2010    Colonoscopy: Not documented   Colonoscopy action/deferral: Deferred  (03/27/2009)  Other Screening   Pap smear:  Specimen Adequacy: Satisfactory for evaluation.   Interpretation/Result:Negative for intraepithelial Lesion or Malignancy.     (03/27/2009)   Pap smear action/deferral: Ordered  (03/27/2009)   Pap smear due: 03/2010    Mammogram: ASSESSMENT: Negative - BI-RADS 1^MM DIGITAL SCREENING  (06/08/2009)   Mammogram due: 06/06/2009   Smoking  status: never  (11/06/2009)  Diabetes Mellitus   HgbA1C: 6.3  (11/06/2009)   HgbA1C action/deferral: Ordered  (03/27/2009)   Hemoglobin A1C due: 02/05/2010    Eye exam: normal  (08/21/2008)   Eye exam due: 08/21/2009    Foot exam: yes  (12/25/2008)   Foot exam action/deferral: Do today   High risk foot: Not documented   Foot care education: Done  (11/06/2009)    Urine microalbumin/creatinine ratio: Not documented   Urine microalbumin action/deferral: Ordered   Urine microalbumin/cr due: 11/07/2010  Lipids   Total Cholesterol: 160  (03/27/2009)   Lipid panel action/deferral: Lipid Panel ordered   LDL: 82  (03/27/2009)   LDL Direct: Not documented   HDL: 46  (03/27/2009)   Triglycerides: 159  (03/27/2009)    SGOT (AST): 20  (12/25/2008)   SGPT (ALT): 15  (12/25/2008)   Alkaline phosphatase: 107  (12/25/2008)   Total bilirubin: 0.6  (12/25/2008)  Hypertension   Last Blood Pressure: 130 /  86  (11/06/2009)   Serum creatinine: 0.57  (12/25/2008)   Serum potassium 4.3  (12/25/2008)  Self-Management Support :    Diabetes self-management support: Not documented    Hypertension self-management support: Not documented    Lipid self-management support: Not documented

## 2010-03-23 NOTE — Assessment & Plan Note (Signed)
Summary: Complete Physical Exam   Vital Signs:  Patient profile:   55 year old female Menstrual status:  postmenopausal Weight:      167.1 pounds BMI:     33.87 BSA:     1.71 Temp:     98.4 degrees F oral Pulse rate:   71 / minute Pulse rhythm:   regular Resp:     16 per minute BP sitting:   130 / 84  (left arm) Cuff size:   regular  Vitals Entered By: Levon Hedger (March 27, 2009 9:34 AM) CC: CPP, Lipid Management, Hypertension Management Is Patient Diabetic? Yes Pain Assessment Patient in pain? no      CBG Result 154  Does patient need assistance? Functional Status Self care Ambulation Normal   CC:  CPP, Lipid Management, and Hypertension Management.  History of Present Illness:  Pt into the office for a complete physical exam  PAP - done 1 year ago Menses - post menopausal at age 9  Mammogram - last done 06/06/2008. Self breast exams at home  Optho -no recent eye exam.  Retasure done last year at [facility] - no recent dental exam  Spanish - interpreter present  Diabetes Management History:      The patient is a 55 years old female who comes in for evaluation of Type 2 Diabetes Mellitus.  She has not been enrolled in the "Diabetic Education Program".  She states understanding of dietary principles and is following her diet appropriately.  No sensory loss is reported.  Self foot exams are not being performed.  She is checking home blood sugars.  She says that she is exercising.        Hypoglycemic symptoms are not occurring.  No hyperglycemic symptoms are reported.        No changes have been made to her treatment plan since last visit.    Hypertension History:      She denies headache, chest pain, and palpitations.  She notes no problems with any antihypertensive medication side effects.        Positive major cardiovascular risk factors include diabetes, hyperlipidemia, and hypertension.  Negative major cardiovascular risk factors include  female age less than 5 years old, negative family history for ischemic heart disease, and non-tobacco-user status.        Further assessment for target organ damage reveals no history of ASHD, cardiac end-organ damage (CHF/LVH), stroke/TIA, peripheral vascular disease, renal insufficiency, or hypertensive retinopathy.    Lipid Management History:      Positive NCEP/ATP III risk factors include diabetes and hypertension.  Negative NCEP/ATP III risk factors include female age less than 56 years old, no family history for ischemic heart disease, non-tobacco-user status, no ASHD (atherosclerotic heart disease), no prior stroke/TIA, no peripheral vascular disease, and no history of aortic aneurysm.        The patient states that she knows about the "Therapeutic Lifestyle Change" diet.  Her compliance with the TLC diet is fair.  The patient does not know about adjunctive measures for cholesterol lowering.  She expresses no side effects from her lipid-lowering medication.  Comments include: pt is taking meds as ordered.  The patient denies any symptoms to suggest myopathy or liver disease.       Habits & Providers  Alcohol-Tobacco-Diet     Alcohol drinks/day: 0     Tobacco Status: never  Exercise-Depression-Behavior     Does Patient Exercise: yes     Exercise Counseling: not indicated;  exercise is adequate     Drug Use: no     Seat Belt Use: 100     Sun Exposure: occasionally  Comments: Unable to complete PHQ-9 even with interpreter  Allergies: No Known Drug Allergies  Review of Systems General:  Denies fever. Eyes:  Denies blurring. ENT:  Denies earache. CV:  Denies chest pain or discomfort. Resp:  Denies cough. GI:  Denies bloody stools. GU:  Denies discharge. MS:  Denies joint pain. Derm:  Denies dryness. Neuro:  Denies headaches. Psych:  Denies anxiety and depression. Endo:  Denies excessive urination.  Physical Exam  General:  alert.   Head:  normocephalic.   Eyes:   pupils equal and pupils round.   Ears:  minimal cerumen  Top of TM visualized - no erythema Nose:  no nasal discharge.   Mouth:  pharynx pink and moist and fair dentition.   Neck:  supple.   Chest Wall:  no deformities.   Breasts:  skin/areolae normal, no masses, and no abnormal thickening.   Lungs:  Normal respiratory effort, chest expands symmetrically. Lungs are clear to auscultation, no crackles or wheezes. Heart:  normal rate and regular rhythm.   murmur - holosystolic Abdomen:  soft, non-tender, and normal bowel sounds.   Rectal:  no external abnormalities.   Msk:  normal ROM.   Pulses:  R radial normal, R dorsalis pedis normal, and L dorsalis pedis normal.   Extremities:  no edema Neurologic:  alert & oriented X3.   Skin:  color normal.   Psych:  Oriented X3.    Pelvic Exam  Vulva:      normal appearance.   Urethra and Bladder:      Urethra--no discharge.  Bladder--normal.   Vagina:      physiologic discharge.   Cervix:      midposition, parous.   Uterus:      smooth.   Adnexa:      normal.   Rectum:      normal, heme negative stool.    Diabetes Management Exam:       Nails:          Left foot: normal          Right foot: normal    Impression & Recommendations:  Problem # 1:  ROUTINE GYNECOLOGICAL EXAMINATION (ICD-V72.31) PAP done EKG done  self breast exam sheet given. mammogram due after 06/06/2009. rec. yearly eye exams  routine dental exams guaiac negative colonscopy indications given to pt EKG done PHQ-9  - unable to complete due to language barrier and comprehension skill Orders: UA Dipstick w/o Micro (manual) ([address]) KOH/ WET Mount [phone]) Pap Smear, Thin Prep ( Collection of) (Q0091) T- GC Chlamydia (09811) Hemoccult Guaiac-1 spec.(in office) (82270)  Problem # 2:  DIABETES MELLITUS II, UNCOMPLICATED (ICD-250.00) stable.  would like Hgba1c to be less than 7 but is getting better continue current meds Her updated medication list for this  problem includes:    Metformin Hcl 500 Mg Tabs (Metformin hcl) ..... One tablet by mouth two times a day    Lisinopril-hydrochlorothiazide 10-12.5 Mg Tabs (Lisinopril-hydrochlorothiazide) ..... One tablet by mouth daily for blood pressure    Aspirin Low Strength 81 Mg Chew (Aspirin) ..... One tablet by mouth daily  Orders: Capillary Blood Glucose/CBG (82948) Hemoglobin A1C (83036)  Problem # 3:  HYPERTENSION, BENIGN SYSTEMIC (ICD-401.1)  DASH diet continue current meds Her updated medication list for this problem includes:    Lisinopril-hydrochlorothiazide 10-12.5 Mg Tabs (Lisinopril-hydrochlorothiazide) ..... One  tablet by mouth daily for blood pressure  Orders: EKG w/ Interpretation (93000)  Problem # 4:  HYPERCHOLESTEROLEMIA (ICD-272.0) will check lipids today continue current meds until labs available Her updated medication list for this problem includes:    Pravastatin Sodium 40 Mg Tabs (Pravastatin sodium) .Marland Kitchen... 1 tablet by mouth nightly  Orders: T-Lipid Profile ([address]-60454) T-CBC w/Diff (09811-91478)  Complete Medication List: 1)  Metformin Hcl 500 Mg Tabs (Metformin hcl) .... One tablet by mouth two times a day 2)  Levothroid 125 Mcg Tabs (Levothyroxine sodium) .Marland Kitchen.. 1 tablet by mouth daily for thyroid 3)  Lisinopril-hydrochlorothiazide 10-12.5 Mg Tabs (Lisinopril-hydrochlorothiazide) .... One tablet by mouth daily for blood pressure 4)  Glucometer Elite Classic Kit (Blood glucose monitoring suppl) .... Dispense meter, test strips and lancets check blood sugar daily dx:250.00 5)  Triamcinolone Acetonide 0.1 % Oint (Triamcinolone acetonide) .Marland Kitchen.. 1 application topically to affected area 6)  Pravastatin Sodium 40 Mg Tabs (Pravastatin sodium) .Marland Kitchen.. 1 tablet by mouth nightly 7)  Sidekick Blood Glucose System Devi (Blood gluc meter disp-strips) .... Dispense test strips to use with current glucometer check blood sugar daily before breakfast  dx 250.00 8)  Calcium 600+d 600-400  Mg-unit Tabs (Calcium carbonate-vitamin d) .Marland Kitchen.. 1 tablet by mouth two times a day 9)  Blood Glucose Test Strp (Glucose blood) .... Use with glucometer elite to check blood sugar once daily dx:250.00 10)  Aspirin Low Strength 81 Mg Chew (Aspirin) .... One tablet by mouth daily  Diabetes Management Assessment/Plan:      The following lipid goals have been established for the patient: Total cholesterol goal of 200; LDL cholesterol goal of 100; HDL cholesterol goal of 40; Triglyceride goal of 150.  Her blood pressure goal is < 130/80.    Hypertension Assessment/Plan:      The patient's hypertensive risk group is category C: Target organ damage and/or diabetes.  Her calculated 10 year risk of coronary heart disease is 11 %.  Today's blood pressure is 130/84.  Her blood pressure goal is < 130/80.  Lipid Assessment/Plan:      Based on NCEP/ATP III, the patient's risk factor category is "history of diabetes".  The patient's lipid goals are as follows: Total cholesterol goal is 200; LDL cholesterol goal is 100; HDL cholesterol goal is 40; Triglyceride goal is 150.    Patient Instructions: 1)  You will need to call this office to get your mammogram scheduled 2)  Follow up in 6 months for diabetes or sooner if necessary. 3)  Call the pharmacy when you need refills on your medications and this office will send refills back to the pharmacy until your next visit  Diabetic Foot Exam Foot Inspection Is there a history of a foot ulcer?              No Is there a foot ulcer now?              No Can the patient see the bottom of their feet?          Yes Are the shoes appropriate in style and fit?          Yes Is there swelling or an abnormal foot shape?          No Are the toenails long?                No Are the toenails thick?                No Are the  toenails ingrown?              No Is there heavy callous build-up?              No Is there pain in the calf muscle (Intermittent claudication) when  walking?    NoIs there a claw toe deformity?              No Is there elevated skin temperature?            No Is there limited ankle dorsiflexion?            No Is there foot or ankle muscle weakness?            No  Diabetic Foot Care Education Pulse Check          Right Foot          Left Foot Dorsalis Pedis:        normal            normal   Diabetic Foot Exam Foot Inspection Is there a history of a foot ulcer?              No Is there a foot ulcer now?              No Can the patient see the bottom of their feet?          Yes Are the shoes appropriate in style and fit?          Yes Is there swelling or an abnormal foot shape?          No Are the toenails long?                No Are the toenails thick?                No Are the toenails ingrown?              No Is there heavy callous build-up?              No Is there pain in the calf muscle (Intermittent claudication) when walking?    NoIs there a claw toe deformity?              No Is there elevated skin temperature?            No Is there limited ankle dorsiflexion?            No Is there foot or ankle muscle weakness?            No  Diabetic Foot Care Education Pulse Check          Right Foot          Left Foot Dorsalis Pedis:        normal            normal   Laboratory Results   Urine Tests  Date/Time Received: March 27, 2009 9:57 AM   Routine Urinalysis   Color: lt. yellow Appearance: Clear Glucose: negative   (Normal Range: Negative) Bilirubin: negative   (Normal Range: Negative) Ketone: negative   (Normal Range: Negative) Spec. Gravity: 1.010   (Normal Range: 1.003-1.035) Blood: trace-lysed   (Normal Range: Negative) pH: 6.0   (Normal Range: 5.0-8.0) Protein: negative   (Normal Range: Negative) Urobilinogen: 0.2   (Normal Range: 0-1) Nitrite: negative   (Normal Range: Negative) Leukocyte Esterace: negative   (Normal Range: Negative)     Blood Tests  Date/Time Received: March 27, 2009 9:57 AM     HGBA1C: 7.2%   (Normal Range: Non-Diabetic - 3-6%   Control Diabetic - 6-8%) CBG Random:: 154  Date/Time Received: March 27, 2009 11:13 AM   Wet Mount/KOH Source: vaginal WBC/hpf: 1-5 Bacteria/hpf: 1+ Clue cells/hpf: none Yeast/hpf: none Trichomonas/hpf: none  Stool - Occult Blood Hemmoccult #1: negative Date: 03/27/2009    Prevention & Chronic Care Immunizations   Influenza vaccine: Fluvax 3+  (12/25/2008)    Tetanus booster: 02/21/2001: Done.    Pneumococcal vaccine: Pneumovax  (10/26/2007)  Colorectal Screening   Hemoccult: Not documented   Hemoccult action/deferral: Ordered  (03/27/2009)   Hemoccult due: 03/27/2010    Colonoscopy: Not documented   Colonoscopy action/deferral: Deferred  (03/27/2009)  Other Screening   Pap smear:  Specimen Adequacy: Satisfactory for evaluation.   Interpretation/Result:Negative for intraepithelial Lesion or Malignancy.     (01/27/2008)   Pap smear action/deferral: Ordered  (03/27/2009)   Pap smear due: 03/28/2011    Mammogram: ASSESSMENT: Negative - BI-RADS 1^MM DIGITAL SCREENING  (06/06/2008)   Mammogram due: 06/06/2009   Smoking status: never  (03/27/2009)  Diabetes Mellitus   HgbA1C: 7.2  (03/27/2009)   HgbA1C action/deferral: Ordered  (03/27/2009)   Hemoglobin A1C due: 06/24/2009    Eye exam: normal  (08/21/2008)   Eye exam due: 08/21/2009    Foot exam: yes  (12/25/2008)   Foot exam action/deferral: Do today   High risk foot: Not documented   Foot care education: Done  (12/25/2008)    Urine microalbumin/creatinine ratio: Not documented  Lipids   Total Cholesterol: 181  (12/25/2008)   Lipid panel action/deferral: Lipid Panel ordered   LDL: 80  (12/25/2008)   LDL Direct: Not documented   HDL: 46  (12/25/2008)   Triglycerides: 273  (12/25/2008)    SGOT (AST): 20  (12/25/2008)   SGPT (ALT): 15  (12/25/2008)   Alkaline phosphatase: 107  (12/25/2008)   Total bilirubin: 0.6   (12/25/2008)  Hypertension   Last Blood Pressure: 130 / 84  (03/27/2009)   Serum creatinine: 0.57  (12/25/2008)   Serum potassium 4.3  (12/25/2008)  Self-Management Support :    Diabetes self-management support: Not documented    Hypertension self-management support: Not documented    Lipid self-management support: Not documented   Laboratory Results   Urine Tests    Routine Urinalysis   Color: lt. yellow Appearance: Clear Glucose: negative   (Normal Range: Negative) Bilirubin: negative   (Normal Range: Negative) Ketone: negative   (Normal Range: Negative) Spec. Gravity: 1.010   (Normal Range: 1.003-1.035) Blood: trace-lysed   (Normal Range: Negative) pH: 6.0   (Normal Range: 5.0-8.0) Protein: negative   (Normal Range: Negative) Urobilinogen: 0.2   (Normal Range: 0-1) Nitrite: negative   (Normal Range: Negative) Leukocyte Esterace: negative   (Normal Range: Negative)     Blood Tests     HGBA1C: 7.2%   (Normal Range: Non-Diabetic - 3-6%   Control Diabetic - 6-8%) CBG Random:: 154mg /dL    DIRECTV KOH: Negative  Stool - Occult Blood Hemmoccult #1: negative     EKG  Procedure date:  03/27/2009  Findings:      normal:

## 2010-03-23 NOTE — Letter (Signed)
Summary: *HSN Results Follow up  [facility]  [address] [place], [place] [address]   Phone: [phone]  Fax: [phone]      04/01/2009   Erin Huff [address] [place], [place]  [address]   Dear  Ms. Warren Danes,                            ____S.Drinkard,FNP   ____D. Gore,FNP       ____B. McPherson,MD   ____V. Rankins,MD    ____E. Mulberry,MD    __X__N. Daphine Deutscher, FNP  ____D. Reche Dixon, MD    ____K. Philipp Deputy, MD    ____Other     This letter is to inform you that your recent test(s):  ___X____Pap Smear    ___X____Lab Test     _______X-ray    ____X___ is within acceptable limits  _______ requires a medication change  _______ requires a follow-up lab visit  _______ requires a follow-up visit with your provider   Comments:  Labs done during recent office visit all ok except your triglycerides are a little high at 159.  It should be less than 150.  Try to bake your food instead of frying.  No medication needed at this time.  Pap Smear results are normal.       _________________________________________________________ If you have any questions, please contact our office [phone].                    Sincerely,    Lehman Prom FNP [facility]

## 2010-03-25 NOTE — Letter (Signed)
Summary: Lipid Letter  [facility]  [address] [place], [place] [address]   Phone: [phone]  Fax: [phone]    02/11/2010  Erin Huff [address] [place], [place]  [address]  Dear Erin Huff Resides:  We have carefully reviewed your last lipid profile from 02/10/2010 and the results are noted below with a summary of recommendations for lipid management.    Cholesterol:       179     Goal: less than 200   HDL "good" Cholesterol:   50     Goal: greater than 40   LDL "bad" Cholesterol:   88     Goal: less than 70   Triglycerides:       203     Goal: less than 150  Labs done during recent office visit shows that your triglycerides are high.  You should monitor the fried and fatty foods in your diet. Blood sugar is doing good.    Current Medications: 1)    Metformin Hcl 500 Mg Tabs (Metformin hcl) .... One tablet by mouth two times a day 2)    Levothroid 125 Mcg Tabs (Levothyroxine sodium) .Marland Kitchen.. 1 tablet by mouth daily for thyroid 3)    Lisinopril-hydrochlorothiazide 10-12.5 Mg Tabs (Lisinopril-hydrochlorothiazide) .... One tablet by mouth daily for blood pressure 4)    Glucometer Elite Classic  Kit (Blood glucose monitoring suppl) .... Dispense meter, test strips and lancets check blood sugar daily dx:250.00 5)    Triamcinolone Acetonide 0.1 % Oint (Triamcinolone acetonide) .Marland Kitchen.. 1 application topically to affected area 6)    Pravastatin Sodium 40 Mg Tabs (Pravastatin sodium) .Marland Kitchen.. 1 tablet by mouth nightly 7)    Sidekick Blood Glucose System  Devi (Blood gluc meter disp-strips) .... Dispense test strips to use with current glucometer check blood sugar daily before breakfast  dx 250.00 8)    Calcium 600+d 600-400 Mg-unit Tabs (Calcium carbonate-vitamin d) .Marland Kitchen.. 1 tablet by mouth two times a day 9)    Blood Glucose Test  Strp (Glucose blood) .... Use with glucometer elite to check blood sugar once daily dx:250.00 10)    Aspirin Low Strength  81 Mg Chew (Aspirin) .... One tablet by mouth daily 11)    Loratadine 10 Mg Tabs (Loratadine) .... One tablet by mouth daily for allergies  If you have any questions, please call. We appreciate being able to work with you.   Sincerely,    [facility] Lehman Prom FNP

## 2010-03-25 NOTE — Assessment & Plan Note (Signed)
Summary: Diabetes/HTN   Vital Signs:  Patient profile:   55 year old female Menstrual status:  postmenopausal Weight:      162.31 pounds Temp:     97.6 degrees F oral Pulse rate:   80 / minute Pulse rhythm:   regular Resp:     16 per minute BP sitting:   120 / 88  (left arm) Cuff size:   regular  Vitals Entered By: Hale Drone CMA (February 10, 2010 8:38 AM) CC: 3 month f/u on DM. Getting over a cold x4 days., Hypertension Management, Lipid Management Is Patient Diabetic? Yes Pain Assessment Patient in pain? no      CBG Result 150 CBG Device ID A Fasting  Does patient need assistance? Functional Status Self care Ambulation Normal   CC:  3 month f/u on DM. Getting over a cold x4 days., Hypertension Management, and Lipid Management.  History of Present Illness:  Pt into the office for 3 month f/u on diabetes.  pt is fasting today for labs. Pt presents today with her medication.  Family present in the office today to interpret  Diabetes Management History:      The patient is a 55 years old female who comes in for evaluation of Type 2 Diabetes Mellitus.  She has not been enrolled in the "Diabetic Education Program".  She states lack of understanding of dietary principles and is not following her diet appropriately.  No sensory loss is reported.  Self foot exams are not being performed.  She is checking home blood sugars.  She says that she is exercising.        Hypoglycemic symptoms are not occurring.  No hyperglycemic symptoms are reported.        No changes have been made to her treatment plan since last visit.    Hypertension History:      She denies headache, chest pain, and palpitations.  She notes no problems with any antihypertensive medication side effects.        Positive major cardiovascular risk factors include diabetes, hyperlipidemia, and hypertension.  Negative major cardiovascular risk factors include female age less than 3 years old, negative family  history for ischemic heart disease, and non-tobacco-user status.        Further assessment for target organ damage reveals no history of ASHD, cardiac end-organ damage (CHF/LVH), stroke/TIA, peripheral vascular disease, renal insufficiency, or hypertensive retinopathy.    Lipid Management History:      Positive NCEP/ATP III risk factors include diabetes and hypertension.  Negative NCEP/ATP III risk factors include female age less than 59 years old, no family history for ischemic heart disease, non-tobacco-user status, no ASHD (atherosclerotic heart disease), no prior stroke/TIA, no peripheral vascular disease, and no history of aortic aneurysm.        The patient states that she knows about the "Therapeutic Lifestyle Change" diet.  Her compliance with the TLC diet is fair.  She expresses no side effects from her lipid-lowering medication.  Comments include: pt is taking meds as ordered.  The patient denies any symptoms to suggest myopathy or liver disease.      Current Medications (verified): 1)  Metformin Hcl 500 Mg Tabs (Metformin Hcl) .... One Tablet By Mouth Two Times A Day 2)  Levothroid 125 Mcg Tabs (Levothyroxine Sodium) .Marland Kitchen.. 1 Tablet By Mouth Daily For Thyroid 3)  Lisinopril-Hydrochlorothiazide 10-12.5 Mg Tabs (Lisinopril-Hydrochlorothiazide) .... One Tablet By Mouth Daily For Blood Pressure 4)  Glucometer Elite Classic  Kit (Blood Glucose Monitoring Suppl) .Marland KitchenMarland KitchenMarland Kitchen  Dispense Meter, Test Strips and Lancets Check Blood Sugar Daily Dx:250.00 5)  Triamcinolone Acetonide 0.1 % Oint (Triamcinolone Acetonide) .Marland Kitchen.. 1 Application Topically To Affected Area 6)  Pravastatin Sodium 40 Mg Tabs (Pravastatin Sodium) .Marland Kitchen.. 1 Tablet By Mouth Nightly 7)  Sidekick Blood Glucose System  Devi (Blood Gluc Meter Disp-Strips) .... Dispense Test Strips To Use With Current Glucometer Check Blood Sugar Daily Before Breakfast  Dx 250.00 8)  Calcium 600+d 600-400 Mg-Unit Tabs (Calcium Carbonate-Vitamin D) .Marland Kitchen.. 1 Tablet By  Mouth Two Times A Day 9)  Blood Glucose Test  Strp (Glucose Blood) .... Use With Glucometer Elite To Check Blood Sugar Once Daily Dx:250.00 10)  Aspirin Low Strength 81 Mg Chew (Aspirin) .... One Tablet By Mouth Daily 11)  Loratadine 10 Mg Tabs (Loratadine) .... One Tablet By Mouth Daily For Allergies 12)  Veramyst 27.5 Mcg/spray Susp (Fluticasone Furoate) .... One Spray in Each Nostril Two Times A Day  Allergies (verified): No Known Drug Allergies  Review of Systems General:  Denies fever. ENT:  Denies earache; itchy throat. CV:  Denies chest pain or discomfort. Resp:  Denies cough. GI:  Denies abdominal pain, nausea, and vomiting.  Physical Exam  General:  alert.   Head:  normocephalic.   Lungs:  normal breath sounds.   Heart:  normal rate and regular rhythm.   holosystolic murmur Abdomen:  normal bowel sounds.   Msk:  normal ROM.   Neurologic:  alert & oriented X3.   Psych:  Oriented X3.     Impression & Recommendations:  Problem # 1:  DIABETES MELLITUS II, UNCOMPLICATED (ICD-250.00) Will check Hgba1c pt to continue current meds Her updated medication list for this problem includes:    Metformin Hcl 500 Mg Tabs (Metformin hcl) ..... One tablet by mouth two times a day    Lisinopril-hydrochlorothiazide 10-12.5 Mg Tabs (Lisinopril-hydrochlorothiazide) ..... One tablet by mouth daily for blood pressure    Aspirin Low Strength 81 Mg Chew (Aspirin) ..... One tablet by mouth daily  Orders: Hemoglobin A1C (83036) Capillary Blood Glucose/CBG (82948) T- Hemoglobin A1C (04540-98119)  Problem # 2:  HYPERTENSION, BENIGN SYSTEMIC (ICD-401.1) BP is stable DASH diet Her updated medication list for this problem includes:    Lisinopril-hydrochlorothiazide 10-12.5 Mg Tabs (Lisinopril-hydrochlorothiazide) ..... One tablet by mouth daily for blood pressure  Problem # 3:  HYPERCHOLESTEROLEMIA (ICD-272.0) will check labs today Her updated medication list for this problem includes:     Pravastatin Sodium 40 Mg Tabs (Pravastatin sodium) .Marland Kitchen... 1 tablet by mouth nightly  Orders: T-Lipid Profile (14782-95621) T-Comprehensive Metabolic Panel [phone]) T-CBC w/Diff (62952-84132) T-TSH (44010-27253)  Problem # 4:  HYPOTHYROIDISM (ICD-244.9) will check labs today Her updated medication list for this problem includes:    Levothroid 125 Mcg Tabs (Levothyroxine sodium) .Marland Kitchen... 1 tablet by mouth daily for thyroid  Orders: T-T3, Free [phone]) T-T4, Free [phone])  Problem # 5:  ALLERGIC RHINITIS (ICD-477.9)  The following medications were removed from the medication list:    Veramyst 27.5 Mcg/spray Susp (Fluticasone furoate) ..... One spray in each nostril two times a day Her updated medication list for this problem includes:    Loratadine 10 Mg Tabs (Loratadine) ..... One tablet by mouth daily for allergies  Complete Medication List: 1)  Metformin Hcl 500 Mg Tabs (Metformin hcl) .... One tablet by mouth two times a day 2)  Levothroid 125 Mcg Tabs (Levothyroxine sodium) .Marland Kitchen.. 1 tablet by mouth daily for thyroid 3)  Lisinopril-hydrochlorothiazide 10-12.5 Mg Tabs (Lisinopril-hydrochlorothiazide) .... One tablet by mouth  daily for blood pressure 4)  Glucometer Elite Classic Kit (Blood glucose monitoring suppl) .... Dispense meter, test strips and lancets check blood sugar daily dx:250.00 5)  Triamcinolone Acetonide 0.1 % Oint (Triamcinolone acetonide) .Marland Kitchen.. 1 application topically to affected area 6)  Pravastatin Sodium 40 Mg Tabs (Pravastatin sodium) .Marland Kitchen.. 1 tablet by mouth nightly 7)  Sidekick Blood Glucose System Devi (Blood gluc meter disp-strips) .... Dispense test strips to use with current glucometer check blood sugar daily before breakfast  dx 250.00 8)  Calcium 600+d 600-400 Mg-unit Tabs (Calcium carbonate-vitamin d) .Marland Kitchen.. 1 tablet by mouth two times a day 9)  Blood Glucose Test Strp (Glucose blood) .... Use with glucometer elite to check blood sugar once daily  dx:250.00 10)  Aspirin Low Strength 81 Mg Chew (Aspirin) .... One tablet by mouth daily 11)  Loratadine 10 Mg Tabs (Loratadine) .... One tablet by mouth daily for allergies  Diabetes Management Assessment/Plan:      The following lipid goals have been established for the patient: Total cholesterol goal of 200; LDL cholesterol goal of 100; HDL cholesterol goal of 40; Triglyceride goal of 150.  Her blood pressure goal is < 130/80.    Hypertension Assessment/Plan:      The patient's hypertensive risk group is category C: Target organ damage and/or diabetes.  Her calculated 10 year risk of coronary heart disease is 11 %.  Today's blood pressure is 120/88.  Her blood pressure goal is < 130/80.  Lipid Assessment/Plan:      Based on NCEP/ATP III, the patient's risk factor category is "history of diabetes".  The patient's lipid goals are as follows: Total cholesterol goal is 200; LDL cholesterol goal is 100; HDL cholesterol goal is 40; Triglyceride goal is 150.  Her LDL cholesterol goal has not been met.    Patient Instructions: 1)  Your medications will be sent to [facility] after your labs are reviewed on tomorrow.  You should be able to go to [facility] for your refills when you complete your supply of medications 2)  Your labs will be checked today and you will be notified of the results 3)  Diabetes -  Refills on glucometer [facility].  Your Hgba1 will be checked with labs today and you will be notified of the results. 4)  Blood pressure - doing will.  Keep taking current  5)  Itchy throat - you have a prescription for loratadine that you had before. You can probably get refills for this from [facility] as it was prescribed a few months ago 6)  Follow up in 4 months with n.martin, fnp for a complete physica exam. will need u/a, cbg, hgba1c, foot check, mammogram, PAP,   Orders Added: 1)  Est. Patient Level III [04540] 2)  T-Lipid Profile [80061-22930] 3)  T-Comprehensive  Metabolic Panel [80053-22900] 4)  T-CBC w/Diff [98119-14782] 5)  T-TSH [95621-30865] 6)  Hemoglobin A1C [83036] 7)  Capillary Blood Glucose/CBG [82948] 8)  T-T3, Free [78469-62952] 9)  T-T4, Free [84132-44010] 10)  T- Hemoglobin A1C [83036-23375]     Diabetic Foot Exam Foot Inspection Is there a history of a foot ulcer?              No Is there a foot ulcer now?              No Can the patient see the bottom of their feet?          Yes Are the shoes appropriate in style and  fit?          Yes Is there swelling or an abnormal foot shape?          No Are the toenails long?                No Are the toenails thick?                No Are the toenails ingrown?              No Is there heavy callous build-up?              No Is there pain in the calf muscle (Intermittent claudication) when walking?    NoIs there a claw toe deformity?              No Is there elevated skin temperature?            No Is there limited ankle dorsiflexion?            No Is there foot or ankle muscle weakness?            No  Diabetic Foot Care Education  High Risk Feet? No

## 2010-03-25 NOTE — Progress Notes (Signed)
Summary: Query:  Refill levothroxine?  Phone Note Refill Request   Refills Requested: Medication #1:  LEVOTHROID 125 MCG TABS 1 tablet by mouth daily for thyroid [facility]  [address] PHONE [phone] . [facility] RESCHEDULE HER APP TILL 02-10-10  Initial call taken by: Domenic Polite,  February 05, 2010 9:03 AM  Follow-up for Phone Call        Refill levothyroxine?  Doesn't seem to be taking consistently.  New appt. 02-10-10 -- last seen 10/2009.  Last thyroid labs 12/2008. Follow-up by: Dutch Quint RN,  February 05, 2010 10:40 AM  Additional Follow-up for Phone Call Additional follow up Details #1::        refill for 30 days notify pt that labs will be checked on her next visit this week so she needs to be sure to keep appt Additional Follow-up by: Lehman Prom FNP,  February 08, 2010 8:12 AM    Additional Follow-up for Phone Call Additional follow up Details #2::    Line is busy -- refill completed.  Dutch Quint RN  February 08, 2010 9:13 AM  Confirmed appt. with pt. and daughter and notified of refill.  Dutch Quint RN  February 08, 2010 11:56 AM   Prescriptions: LEVOTHROID 125 MCG TABS (LEVOTHYROXINE SODIUM) 1 tablet by mouth daily for thyroid  #30 Each x 0   Entered by:   Dutch Quint RN   Authorized by:   Lehman Prom FNP   Signed by:   Dutch Quint RN on 02/08/2010   Method used:   Electronically to        [facility] [phone] (retail)       [address]       [place], [place]  [address]       Ph: [phone]       Fax: [phone]   RxID:   [id number]

## 2010-09-10 ENCOUNTER — Other Ambulatory Visit: Payer: Self-pay | Admitting: Internal Medicine

## 2010-09-10 DIAGNOSIS — Z1231 Encounter for screening mammogram for malignant neoplasm of breast: Secondary | ICD-10-CM

## 2010-09-24 ENCOUNTER — Ambulatory Visit (HOSPITAL_COMMUNITY)
Admission: RE | Admit: 2010-09-24 | Discharge: 2010-09-24 | Disposition: A | Discharge status: Home / Self Care | Payer: Self-pay | Source: Ambulatory Visit | Attending: Internal Medicine | Admitting: Internal Medicine

## 2010-09-24 DIAGNOSIS — Z1231 Encounter for screening mammogram for malignant neoplasm of breast: Secondary | ICD-10-CM

## 2010-11-29 ENCOUNTER — Other Ambulatory Visit: Payer: Self-pay | Admitting: Family Medicine

## 2011-03-25 DIAGNOSIS — I350 Nonrheumatic aortic (valve) stenosis: Secondary | ICD-10-CM

## 2011-03-25 HISTORY — DX: Nonrheumatic aortic (valve) stenosis: I35.0

## 2011-11-18 ENCOUNTER — Other Ambulatory Visit (HOSPITAL_COMMUNITY): Payer: Self-pay | Admitting: Geriatric Medicine

## 2011-11-18 DIAGNOSIS — Z1231 Encounter for screening mammogram for malignant neoplasm of breast: Secondary | ICD-10-CM

## 2011-12-06 ENCOUNTER — Ambulatory Visit (HOSPITAL_COMMUNITY)
Admission: RE | Admit: 2011-12-06 | Discharge: 2011-12-06 | Disposition: A | Discharge status: Home / Self Care | Payer: Self-pay | Source: Ambulatory Visit | Attending: Geriatric Medicine | Admitting: Geriatric Medicine

## 2011-12-06 DIAGNOSIS — Z1231 Encounter for screening mammogram for malignant neoplasm of breast: Secondary | ICD-10-CM | POA: Insufficient documentation

## 2012-11-22 ENCOUNTER — Other Ambulatory Visit: Payer: Self-pay | Admitting: Family Medicine

## 2012-12-04 ENCOUNTER — Other Ambulatory Visit (HOSPITAL_COMMUNITY): Payer: Self-pay | Admitting: Geriatric Medicine

## 2012-12-04 DIAGNOSIS — Z1231 Encounter for screening mammogram for malignant neoplasm of breast: Secondary | ICD-10-CM

## 2012-12-21 ENCOUNTER — Ambulatory Visit (HOSPITAL_COMMUNITY)
Admission: RE | Admit: 2012-12-21 | Discharge: 2012-12-21 | Disposition: A | Discharge status: Home / Self Care | Payer: Self-pay | Source: Ambulatory Visit | Attending: Geriatric Medicine | Admitting: Geriatric Medicine

## 2012-12-21 DIAGNOSIS — Z1231 Encounter for screening mammogram for malignant neoplasm of breast: Secondary | ICD-10-CM

## 2013-01-11 ENCOUNTER — Ambulatory Visit: Payer: Self-pay | Attending: Internal Medicine

## 2013-11-06 DIAGNOSIS — Y9289 Other specified places as the place of occurrence of the external cause: Secondary | ICD-10-CM | POA: Insufficient documentation

## 2013-11-06 DIAGNOSIS — I10 Essential (primary) hypertension: Secondary | ICD-10-CM | POA: Insufficient documentation

## 2013-11-06 DIAGNOSIS — Y9389 Activity, other specified: Secondary | ICD-10-CM | POA: Insufficient documentation

## 2013-11-06 DIAGNOSIS — S91309A Unspecified open wound, unspecified foot, initial encounter: Secondary | ICD-10-CM | POA: Insufficient documentation

## 2013-11-06 DIAGNOSIS — S99929A Unspecified injury of unspecified foot, initial encounter: Secondary | ICD-10-CM

## 2013-11-06 DIAGNOSIS — Z23 Encounter for immunization: Secondary | ICD-10-CM | POA: Insufficient documentation

## 2013-11-06 DIAGNOSIS — S8990XA Unspecified injury of unspecified lower leg, initial encounter: Secondary | ICD-10-CM | POA: Insufficient documentation

## 2013-11-06 DIAGNOSIS — S99919A Unspecified injury of unspecified ankle, initial encounter: Secondary | ICD-10-CM

## 2013-11-06 DIAGNOSIS — E119 Type 2 diabetes mellitus without complications: Secondary | ICD-10-CM | POA: Insufficient documentation

## 2013-11-06 DIAGNOSIS — IMO0002 Reserved for concepts with insufficient information to code with codable children: Secondary | ICD-10-CM | POA: Insufficient documentation

## 2013-11-07 ENCOUNTER — Encounter (HOSPITAL_COMMUNITY): Payer: Self-pay | Admitting: Emergency Medicine

## 2013-11-07 ENCOUNTER — Emergency Department (HOSPITAL_COMMUNITY)
Admission: EM | Admit: 2013-11-07 | Discharge: 2013-11-07 | Disposition: A | Discharge status: Home / Self Care | Payer: Self-pay | Attending: Emergency Medicine | Admitting: Emergency Medicine

## 2013-11-07 DIAGNOSIS — S91312A Laceration without foreign body, left foot, initial encounter: Secondary | ICD-10-CM

## 2013-11-07 HISTORY — DX: Type 2 diabetes mellitus without complications: E11.9

## 2013-11-07 HISTORY — DX: Essential (primary) hypertension: I10

## 2013-11-07 HISTORY — DX: Disorder of thyroid, unspecified: E07.9

## 2013-11-07 MED ORDER — LIDOCAINE-EPINEPHRINE (PF) 2 %-1:200000 IJ SOLN
10.0000 mL | Freq: Once | INTRAMUSCULAR | Status: AC
  Administered 2013-11-07: 10 mL via INTRADERMAL

## 2013-11-07 MED ORDER — LIDOCAINE-EPINEPHRINE 1 %-1:100000 IJ SOLN
20.0000 mL | Freq: Once | INTRAMUSCULAR | Status: DC
  Filled 2013-11-07: qty 1

## 2013-11-07 MED ORDER — TETANUS-DIPHTH-ACELL PERTUSSIS 5-2.5-18.5 LF-MCG/0.5 IM SUSP
0.5000 mL | Freq: Once | INTRAMUSCULAR | Status: AC
  Administered 2013-11-07: 0.5 mL via INTRAMUSCULAR
  Filled 2013-11-07: qty 0.5

## 2013-11-07 NOTE — ED Provider Notes (Signed)
Medical screening examination/treatment/procedure(s) were performed by non-physician practitioner and as supervising physician I was immediately available for consultation/collaboration.   EKG Interpretation None       Saabir Blyth M Jr Milliron, MD 11/07/13 0602 

## 2013-11-07 NOTE — ED Notes (Signed)
Pt. accidentally hit her left foot against the door this morning at home , presents with skin tear at left heel , bleeding controlled , dressing applied prior to arrival .

## 2013-11-07 NOTE — Discharge Instructions (Signed)
Apply triple antibiotic ointment twice a day. Keep clean and covered. Wash with soap and water. Followup in 10 days for suture removal. Return or followup sooner if any signs of infection.    Cuidados de Hotel manager - Adultos  (Laceration Care, Adult)  Una herida cortante es un corte o lesin que atraviesa todas las capas de la piel y el tejido que se encuentra debajo de la piel.  TRATAMIENTO  Algunas laceraciones no requieren sutura. Algunas no deben cerrarse debido a que puede aumentar el riesgo de infeccin. Es importante que consulte al mdico lo antes posible despus de recibir una lesin para minimizar el riesgo de infeccin y aumentar la posibilidad de que se cierre con xito.  Cuando se cierra adecuadamente, podrn indicarle analgsicos, si los necesita. La herida debe limpiarse para combatir la infeccin. El mdico usar puntos (suturas), [place], o tiras [place] para Environmental consultant. Estos elementos mantendrn unidos los bordes de la piel para que se cure ms rpidamente y para un mejor resultado cosmtico. Sin embargo, todas las heridas se curarn con una cicatriz. Una vez que la herida se haya curado, las cicatrices pueden minimizarse cubriendo la herida con pantalla solar durante el da por un lapso se 1 ao.  INSTRUCCIONES PARA EL CUIDADO EN EL HOGAR  Si tiene puntos o grapas:   Mantenga la herida limpia y [place].  Si tiene un (vendaje) cmbielo al menos una vez al da. Cmbielo si se moja o se ensucia, o segn las indicaciones del mdico.  Lave el corte dos veces por da con agua y [place]. Enjuguelo con agua para quitar todo el [place]. Seque dando palmaditas con una toalla limpia y seca.  Despus de limpiar, aplique una delgada capa de una crema con antibitico segn las indicaciones del mdico. Esto le ayudar a prevenir las infecciones y a Automotive engineer que el vendaje se Building services engineer.  Puede ducharse despus de las primeras 24 horas. No remoje la herida en agua hasta que le  hayan quitado los puntos.  Solo tome medicamentos que se pueden comprar sin receta o recetados para Chief Technology Officer, Dentist o fiebre, como le indica el mdico.  Concurra para que le retiren los puntos o las grapas cuando el mdico le indique. En caso que tenga tiras ZOXWRUEAV:   Mantenga la herida limpia y seca.  No deje que las tiras se mojen. Puede darse un bao cuidando de [employer] herida seca.  Si se moja, squela dando palmaditas con una toalla limpia.  Las tiras caern por s mismas. Puede recortar las tiras a medida que la herida se [place]. No quite las tiras que estn pegadas a la herida. Ellas se caern cuando sea el momento. En caso que le hayan [place].   Podr mojara momentneamente la herida en la ducha o el bao. No frote ni sumerja la herida. No practique natacin. Evite transpirar con abundancia hasta que el [place] se haya cado. Despus de ducharse o darse un bao, seque el corte dando palmaditas con una toalla limpia.  No aplique medicamentos lquidos, en crema o ungentos mientras el [employer]COMM est en su lugar. Podr aflojarlo antes de que la herida se cure.  Si tiene un vendaje, tenga cuidado de no aplicar cinta adhesiva directamente [employer]. Esto puede hacer que el [place] se caiga antes de que la herida se haya curado.  Evite la exposicin prolongada a la luz del sol o a la International aid/development worker en [employer]COMM se Arts administrator. La exposicin  a los Research scientist (medical) Training and development officer.  El [employer] la piel durante 5 a 10 das y Therapist, occupational. No quite la pelcula de [place]. Deber aplicarse la vacuna contra el ttanos si:  No recuerda cundo se coloc la vacuna la ltima vez.  Nunca recibi esta vacuna. Si le han aplicado la vacuna contra el ttanos, el brazo podr hincharse, enrojecer y sentirse caliente al tacto. Esto es frecuente y no es un problema. Si usted necesita aplicarse la  vacuna y se niega a recibirla, corre riesgo de contraer ttanos. sta es una enfermedad grave.  SOLICITE ATENCIN MDICA SI:   Presenta enrojecimiento, hinchazn o aumento del dolor en la herida.  Hay rayas rojas que salen de la herida.  Observa un lquido blanco amarillento (pus) en la herida.  Tiene fiebre.  Advierte un olor ftido que proviene de la herida o del vendaje.  La herida se abre luego de que le han extrado las suturas.  Nota que en la herida hay algn cuerpo extrao como un trozo de [place] o vidrio.  La herida est en su mano o pie y observa que no puede mover correctamente los dedos. SOLICITE ATENCIN MDICA DE INMEDIATO SI:   El dolor no se alivia con los [employer].  Hay una zona muy hinchada alrededor de la herida que le causa dolor y adormecimiento, o advierte un cambio en el color en el brazo, la mano, la pierna o el pie.  La herida se abre y sangra nuevamente.  Siente que el adormecimiento, la debilidad o la prdida de la funcin de la articulacin que rodea la herida [place].  Palpa ndulos dolorosos cerca de la herida o bajo la piel en cualquier zona del cuerpo. ASEGRESE DE QUE:   Comprende estas instrucciones.  Controlar su enfermedad.  Solicitar ayuda de inmediato si no mejora o si empeora. Document Released: 02/07/2005 Document Revised: 05/02/2011 [facility] Patient Information 2015 [place], [place]. This information is not intended to replace advice given to you by your health care provider. Make sure you discuss any questions you have with your health care provider.

## 2013-11-07 NOTE — ED Provider Notes (Signed)
CSN: [id number]     Arrival date & time 11/06/13  2348 History   First MD Initiated Contact with Patient 11/07/13 0021     Chief Complaint  Patient presents with  . Foot Injury     (Consider location/radiation/quality/duration/timing/severity/associated sxs/prior Treatment) HPI Erin Huff is a 58 y.o. female who presents to ED with complaint of heel laceration. States she cut her left heel on a door earlier today. Dressing applied at home and bleeding controlled. Pt is a diabetic, family concerned about possible infection and wondering if pt needs sutures. Pt has no other complaints. No numbness or weakness distal to the injury. Palpation and walking makes pain worse, stay off of the leg makes pain better. Pain does not radiate. It is sharp.   Past Medical History  Diagnosis Date  . Hypertension   . Diabetes mellitus without complication   . Thyroid disease    History reviewed. No pertinent past surgical history. No family history on file. History  Substance Use Topics  . Smoking status: Never Smoker   . Smokeless tobacco: Not on file  . Alcohol Use: No   OB History   Grav Para Term Preterm Abortions TAB SAB Ect Mult Living                 Review of Systems  Constitutional: Negative for fever and chills.  Respiratory: Negative for cough, chest tightness and shortness of breath.   Cardiovascular: Negative for chest pain, palpitations and leg swelling.  Musculoskeletal: Negative for arthralgias, myalgias, neck pain and neck stiffness.  Skin: Positive for wound. Negative for rash.  Neurological: Negative for dizziness, weakness and headaches.  All other systems reviewed and are negative.     Allergies  Review of patient's allergies indicates no known allergies.  Home Medications   Prior to Admission medications   Not on File   BP 138/81  Pulse 81  Temp(Src) 99.4 F (37.4 C) (Oral)  Resp 14  Ht  (1.499 m)  Wt 167 lb (75.751 kg)  BMI 33.71  kg/m2  SpO2 97% Physical Exam  Nursing note and vitals reviewed. Constitutional: She appears well-developed and well-nourished. No distress.  Eyes: Conjunctivae are normal.  Neck: Neck supple.  Neurological: She is alert.  Skin: Skin is warm and dry.     5 cm laceration to the left posterior distal shin, just lateral to the Achilles tendon and just proximal to the calcaneus. Laceration is hemostatic. Non-gaping.    ED Course  Procedures (including critical care time) Labs Review Labs Reviewed - No data to display  Imaging Review No results found.   EKG Interpretation None      LACERATION REPAIR Performed by: Domenica Fail PA-S and myself Jaynie Crumble A Authorized by: Jaynie Crumble A Consent: Verbal consent obtained. Risks and benefits: risks, benefits and alternatives were discussed Consent given by: patient Patient identity confirmed: provided demographic data Prepped and Draped in normal sterile fashion Wound explored  Laceration Location: left distal posterior shin  Laceration Length: 5cm  No Foreign Bodies seen or palpated  Anesthesia: local infiltration  Local anesthetic: lidocaine 2% w epinephrine  Anesthetic total: 6 ml  Irrigation method: syringe Amount of cleaning: standard  Skin closure: prolene 4.0  Number of sutures: 5  Technique: simple interrupted  Patient tolerance: Patient tolerated the procedure well with no immediate complications.  MDM   Final diagnoses:  Laceration of left heel, initial encounter    Patient with laceration to the posterior shin/heel. Repaired with  sutures. No other injuries. She is neurovascularly intact. Tetanus updated. Home with topical antibiotic ointment and follow up for suture removal in 10 days or as needed.  Filed Vitals:   11/07/13 0016  BP: 138/81  Pulse: 81  Temp: 99.4 F (37.4 C)  TempSrc: Oral  Resp: 14  Height:  (1.499 m)  Weight: 167 lb (75.751 kg)  SpO2: 97%         Rajanee Schuelke A Kamryn Gauthier, PA-C 11/07/13 0115

## 2013-11-07 NOTE — ED Notes (Signed)
PA at bedside suturing pt,

## 2013-11-18 ENCOUNTER — Ambulatory Visit: Payer: Self-pay

## 2013-11-19 ENCOUNTER — Encounter (HOSPITAL_COMMUNITY): Payer: Self-pay | Admitting: Emergency Medicine

## 2013-11-19 ENCOUNTER — Emergency Department (HOSPITAL_COMMUNITY)
Admission: EM | Admit: 2013-11-19 | Discharge: 2013-11-19 | Disposition: A | Discharge status: Home / Self Care | Payer: Self-pay | Attending: Emergency Medicine | Admitting: Emergency Medicine

## 2013-11-19 DIAGNOSIS — I10 Essential (primary) hypertension: Secondary | ICD-10-CM | POA: Insufficient documentation

## 2013-11-19 DIAGNOSIS — E119 Type 2 diabetes mellitus without complications: Secondary | ICD-10-CM | POA: Insufficient documentation

## 2013-11-19 DIAGNOSIS — Z4802 Encounter for removal of sutures: Secondary | ICD-10-CM | POA: Insufficient documentation

## 2013-11-19 NOTE — ED Provider Notes (Signed)
Medical screening examination/treatment/procedure(s) were performed by non-physician practitioner and as supervising physician I was immediately available for consultation/collaboration.   EKG Interpretation None       Karolina Zamor K Linker, MD 11/19/13 1023 

## 2013-11-19 NOTE — ED Notes (Signed)
Sutures intact to left heel of foot.

## 2013-11-19 NOTE — ED Provider Notes (Signed)
CSN: [id number]     Arrival date & time 11/19/13  0935 History   First MD Initiated Contact with Patient 11/19/13 [phone]     No chief complaint on file.    (Consider location/radiation/quality/duration/timing/severity/associated sxs/prior Treatment) HPI Comments: Patient is a 58 year old female presenting emergency room chief complaint of suture removal. Patient received laceration to left posterior heel 11/06/2013. Sutures were placed 917/2015. Denies fever, chills.  The history is provided by the patient. No language interpreter was used.    Past Medical History  Diagnosis Date  . Hypertension   . Diabetes mellitus without complication   . Thyroid disease    History reviewed. No pertinent past surgical history. No family history on file. History  Substance Use Topics  . Smoking status: Never Smoker   . Smokeless tobacco: Not on file  . Alcohol Use: No   OB History   Grav Para Term Preterm Abortions TAB SAB Ect Mult Living                 Review of Systems SEE HPI   Allergies  Review of patient's allergies indicates no known allergies.  Home Medications   Prior to Admission medications   Not on File   BP 115/67  Pulse 68  Temp(Src) 98.6 F (37 C) (Oral)  SpO2 100% Physical Exam  Nursing note and vitals reviewed. Constitutional: She appears well-developed and well-nourished. No distress.  HENT:  Head: Normocephalic and atraumatic.  Neck: Neck supple.  Pulmonary/Chest: Effort normal. She has no wheezes.  Skin: She is not diaphoretic.  Healing 5 cm laceration to left lateral heel, no discharge or surrounding erythema. 5 intact sutures.    ED Course  SUTURE REMOVAL Date/Time: 11/19/2013 9:53 AM Performed by: Mellody DrownPARKER, Felma Pfefferle Authorized by: Mellody DrownPARKER, Sol Odor Consent: Verbal consent obtained. Risks and benefits: risks, benefits and alternatives were discussed Consent given by: patient Patient understanding: patient states understanding of the procedure being  performed Patient consent: the patient's understanding of the procedure matches consent given Required items: required blood products, implants, devices, and special equipment available Patient identity confirmed: verbally with patient Time out: Immediately prior to procedure a "time out" was called to verify the correct patient, procedure, equipment, support staff and site/side marked as required. Body area: lower extremity Location details: left foot Wound Appearance: clean Sutures Removed: 5 Post-removal: dressing applied Facility: sutures placed in this facility Patient tolerance: Patient tolerated the procedure well with no immediate complications.   (including critical care time) Labs Review Labs Reviewed - No data to display  Imaging Review No results found.   EKG Interpretation None      MDM   Final diagnoses:  Encounter for removal of sutures   Patient with 5 intact sutures, placed 11/07/2013. Td updated at last visit. No signs of infection on exam, afebrile.     Mellody DrownLauren Deashia Soule, PA-C 11/19/13 [phone]

## 2013-11-19 NOTE — Discharge Instructions (Signed)
Call for a follow up appointment with a Family or Primary Care Provider.  Return if Symptoms worsen.   Take medication as prescribed.  Avoid shoes to place pressure to the back of the heel, keep dressing applied.    Emergency Department Resource Guide 1) Find a Doctor and Pay Out of Pocket Although you won't have to find out who is covered by your insurance plan, it is a good idea to ask around and get recommendations. You will then need to call the office and see if the doctor you have chosen will accept you as a new patient and what types of options they offer for patients who are self-pay. Some doctors offer discounts or will set up payment plans for their patients who do not have insurance, but you will need to ask so you aren't surprised when you get to your appointment.  2) Contact Your Local Health Department Not all health departments have doctors that can see patients for sick visits, but many do, so it is worth a call to see if yours does. If you don't know where your local health department is, you can check in your phone book. The CDC also has a tool to help you locate your state's health department, and many state websites also have listings of all of their local health departments.  3) Find a Walk-in Clinic If your illness is not likely to be very severe or complicated, you may want to try a walk in clinic. These are popping up all over the country in pharmacies, drugstores, and shopping centers. They're usually staffed by nurse practitioners or physician assistants that have been trained to treat common illnesses and complaints. They're usually fairly quick and inexpensive. However, if you have serious medical issues or chronic medical problems, these are probably not your best option.  No Primary Care Doctor: - Call Health Connect at  [phone] - they can help you locate a primary care doctor that  accepts your insurance, provides certain services, etc. - Physician Referral Service-  [phone]  Chronic Pain Problems: Organization         Address  Phone   Notes  [facility]  [phone] Patients need to be referred by their primary care doctor.   Medication Assistance: Organization         Address  Phone   Notes  [facility] [address] [place], [place] [address] [phone]) [phone] --Must be a resident of [facility] [place] [place] -- Must have NO insurance coverage whatsoever (no Medicaid/ Medicare, etc.) -- The pt. MUST have a primary care doctor that directs their care regularly and follows them in the community   MedAssist  [phone]) [phone]   [employer]  [phone]    Agencies that provide inexpensive medical care: Organization         Address  Phone   Notes  [facility]  [phone]) [phone]   [facility]    [phone]) [phone]   [facility] [address] [place], [place] [address] [phone]) [phone]   [facility] [address], [place] [phone]) [phone]   [facility]    [phone]) [phone]   [facility]    [phone]   [facility]  [address], [place] Phone:  [phone]) [phone], Fax:  [phone]) [phone] Hours of Operation:  9 am - 6 pm, M-F.  Also accepts Medicaid/Medicare and  self-pay.  [facility] for [place], [address], [place] Phone: [phone], Fax: [phone]. Hours of Operation:  8:30 am - 5:30 pm, M-F.  Also accepts Medicaid and self-pay.  [facility] [place] [address] [place] Phone: [phone]   [place], [place], [place] [phone], Ext. [phone] Mondays & Thursdays: 7-9 AM.  First 15 patients are seen on a first come, first serve basis.    [facility] Providers:  Organization         Address  Phone   Notes  [facility] [address],  [place] [phone] Also accepts self-pay patients.  [facility] [id number] [place], [place]  [phone]   [place], [address], [place] [phone]   [facility] [address], [place] [phone]   Lucianne Lei [address], [place]   [phone] Only accepts [place] Access [place] patients after they have their name applied to their card.   Self-Pay (no insurance) in [facility]:  Organization         Address  Phone   Notes  Sickle Cell Patients, [facility] [place] [phone]   [facility] [place] [phone]   [facility] [place]  [place], [place], [place] [phone]   [facility]/Dr. Osei-Bonsu  [address], [place] or [address], [place] [phone] Phone number for both [place] and [place] locations is the same.  [facility] [address] [place] [phone]   [facility] [address], [place] or [address] [phone]   [facility] [address] [place] [phone], phone; [phone], fax Sees patients 1st and 3rd Saturday of every month.  Must not qualify for public or private insurance (i.e. Medicaid, Medicare, [place] Health Choice, Veterans' Benefits)  Household income should be no more than 200% of the poverty level The clinic cannot treat you if you are pregnant or think you are pregnant  Sexually transmitted diseases are not treated at the clinic.    Dental Care: Organization         Address  Phone  Notes  [facility] Department of [facility] [place] [phone] Accepts children up to age 83 who are enrolled in  [place] or [place]; pregnant women with a Medicaid card; and children who have applied for Medicaid or [place] Health Choice, but were declined, whose parents can pay a reduced fee at time of service.  [facility] Department of [facility]  [address], [place] [phone] Accepts children up to age 32 who are enrolled in [place] or [place]; pregnant women with a Medicaid card; and children who have applied for Medicaid or [place] Health Choice, but were declined, whose parents can pay a reduced fee at time of service.  [facility] Access PROGRAM  [place] [phone] Patients are seen by appointment only. Walk-ins are not accepted. [place] will see patients 28 years of age and older. Monday - Tuesday (8am-5pm) Most Wednesdays (8:30-5pm) $  30 per visit, cash only  [facility] [employer] PROGRAM  [address], [facility] [phone] Patients are seen by appointment only. Walk-ins are not accepted. [place] will see patients 66 years of age and older. One Wednesday Evening (Monthly: Volunteer Based).  $30 per visit, cash only  [place]  [phone] for adults; Children under age 85, call Graduate Pediatric Dentistry at [phone]. Children aged 31-14, please call [phone] to request a pediatric application.  Dental services are provided in all areas of dental care including fillings, crowns and bridges, complete and partial dentures, implants, gum treatment, root canals, and extractions. Preventive care is also provided. Treatment is provided to both adults and children. Patients are selected via a lottery and there is often a waiting list.   [facility] [address]  [phone] [email or web address]   [facility] [address] [place], [place] [phone], Ext. [phone] Second and Fourth Thursday of each month, opens at 6:30  AM; Clinic ends at 9 AM.  Patients are seen on a first-come first-served basis, and a limited number are seen during each clinic.   [facility]  [address] [place], [place] [phone]   Eligibility Requirements You must have lived in [place], [place], or [place] counties for at least the last three months.   You cannot be eligible for state or federal sponsored [employer], including [employer], [place], or [employer].   You generally cannot be eligible for healthcare insurance through your employer.    How to apply: Eligibility screenings are held every Tuesday and Wednesday afternoon from 1:00 pm until 4:00 pm. You do not need an appointment for the interview!  [facility] [address] [place], [place]   [place]  [place] Department  [place]  [phone]    Behavioral Health Resources in the Community: Intensive Outpatient Programs Organization         Address  Phone  Notes  [place]. [address] [place], [place] [phone]   [facility] [address] [place], [place]   ADS: Alcohol & Drug Svcs [address], [place], [place]   [place] [address],  [place], [place] or [phone]   Substance Abuse Resources Organization         Address  Phone  Notes  Alcohol and Drug Services  [phone]   [place]  [phone]   [place]   Chinita Pester  [phone]   Residential & Outpatient Substance Abuse Program  [phone]   Psychological Services Organization         Address  Phone  Notes  [facility] [place]  [place]  [phone]   [place] [address] [place] or  [phone]    Mobile Crisis Teams Organization         Address  Phone  Notes  [facility]rapeutic Alternatives, Mobile Crisis Care Unit  [phone]   [facility]ive Psychotherapeutic Services  [address] [place], [place]   [place] Levels [address], [place] [phone]    Self-Help/Support Groups Organization         Address  Phone  Notes  [facility] [place] - variety of support groups  [place] Call for more information  Narcotics Anonymous (NA), Caring [facility] [address], [employer] [place]  2 meetings at this location   Special educational needs teacher         Address  Phone  Notes  ASAP Residential Treatment [place],    [place]  [phone]   [facility]  [address] [place] [id number], [place], [place]   [facility] [place], [place] [phone] Admissions: 8am-3pm M-F  Incentives Substance [place] [address]    [place], [place] [phone]   [facility] [address] [place], [place], [place]   [facility] [address],  [place], [place]   [facility]ight Programs - Intensive Outpatient [address] Kristeen Mans [address], [place], [place]   [facility] ([place] [address]  [place], [place] [phone] or [phone]   [facility] Services (RTS) [address], [place], [place] Accepts Medicaid  [facility] [place] [address]  [place] [phone] Substance Abuse/Addiction Treatment   [facility] Organization         Address  Phone  Notes  [facility]  [phone]   Domenic Schwab, PhD [address] Arlis Porta [place], [place]   [phone] or [phone]   [place], [place] [phone]   [facility] [address],  [place], [place] [phone] Insurance/Medicaid/sponsorship through [facility] and Families [address] [place]                                    [place], [place] [phone] [place] [address] [place], [place] [phone]    Dr. Adele Schilder  [phone]   [facility] Dept. 1) [address] [place] 2) [place] 3)  [place], [place] [phone]  [phone]   [place] [phone] or [phone] (After Hours)

## 2013-12-17 ENCOUNTER — Other Ambulatory Visit (HOSPITAL_COMMUNITY): Payer: Self-pay | Admitting: Geriatric Medicine

## 2013-12-31 ENCOUNTER — Other Ambulatory Visit (HOSPITAL_COMMUNITY): Payer: Self-pay | Admitting: Physician Assistant

## 2013-12-31 DIAGNOSIS — Z1231 Encounter for screening mammogram for malignant neoplasm of breast: Secondary | ICD-10-CM

## 2014-01-06 ENCOUNTER — Ambulatory Visit (HOSPITAL_COMMUNITY)
Admission: RE | Admit: 2014-01-06 | Discharge: 2014-01-06 | Disposition: A | Discharge status: Home / Self Care | Payer: Self-pay | Source: Ambulatory Visit | Attending: Physician Assistant | Admitting: Physician Assistant

## 2014-01-06 DIAGNOSIS — Z1231 Encounter for screening mammogram for malignant neoplasm of breast: Secondary | ICD-10-CM

## 2014-05-03 ENCOUNTER — Ambulatory Visit (INDEPENDENT_AMBULATORY_CARE_PROVIDER_SITE_OTHER): Payer: Self-pay | Admitting: Family Medicine

## 2014-05-03 VITALS — BP 144/80 | HR 66 | Temp 97.7°F | Resp 19 | Ht 59.5 in | Wt 171.0 lb

## 2014-05-03 DIAGNOSIS — E119 Type 2 diabetes mellitus without complications: Secondary | ICD-10-CM

## 2014-05-03 DIAGNOSIS — N390 Urinary tract infection, site not specified: Secondary | ICD-10-CM

## 2014-05-03 DIAGNOSIS — R1032 Left lower quadrant pain: Secondary | ICD-10-CM

## 2014-05-03 DIAGNOSIS — R51 Headache: Secondary | ICD-10-CM

## 2014-05-03 DIAGNOSIS — M545 Low back pain: Secondary | ICD-10-CM

## 2014-05-03 DIAGNOSIS — R8281 Pyuria: Secondary | ICD-10-CM

## 2014-05-03 DIAGNOSIS — R519 Headache, unspecified: Secondary | ICD-10-CM

## 2014-05-03 LAB — POCT CBC
Granulocyte percent: 68 %G (ref 37–80)
HCT, POC: 38.9 % (ref 37.7–47.9)
Hemoglobin: 12.4 g/dL (ref 12.2–16.2)
Lymph, poc: 1.8 (ref 0.6–3.4)
MCH, POC: 28.2 pg (ref 27–31.2)
MCHC: 31.9 g/dL (ref 31.8–35.4)
MCV: 88.5 fL (ref 80–97)
MID (cbc): 0.4 (ref 0–0.9)
MPV: 7.4 fL (ref 0–99.8)
POC Granulocyte: 4.6 (ref 2–6.9)
POC LYMPH PERCENT: 26.6 %L (ref 10–50)
POC MID %: 5.4 %M (ref 0–12)
Platelet Count, POC: 222 10*3/uL (ref 142–424)
RBC: 4.39 M/uL (ref 4.04–5.48)
RDW, POC: 13.5 %
WBC: 6.7 10*3/uL (ref 4.6–10.2)

## 2014-05-03 LAB — POCT URINALYSIS DIPSTICK
Bilirubin, UA: NEGATIVE
Glucose, UA: NEGATIVE
Ketones, UA: NEGATIVE
Nitrite, UA: NEGATIVE
Protein, UA: NEGATIVE
Spec Grav, UA: 1.015
Urobilinogen, UA: 0.2
pH, UA: 6

## 2014-05-03 LAB — GLUCOSE, POCT (MANUAL RESULT ENTRY): POC Glucose: 124 mg/dl — AB (ref 70–99)

## 2014-05-03 MED ORDER — BUTALBITAL-APAP-CAFFEINE 50-325-40 MG PO TABS: 1.0000 | ORAL_TABLET | Freq: Four times a day (QID) | ORAL | Status: DC | PRN

## 2014-05-03 MED ORDER — CIPROFLOXACIN HCL 250 MG PO TABS: 250.0000 mg | ORAL_TABLET | Freq: Two times a day (BID) | ORAL | Status: DC

## 2014-05-03 NOTE — Progress Notes (Signed)
Subjective:    Patient ID: Erin Huff, female    DOB: 04/29/1955, 59 y.o.   MRN: [id number] This chart was scribed for Elvina Sidle, MD by Littie Deeds, Medical Scribe. This patient was seen in Room 1 and the patient's care was started at 10:45 AM.   HPI HPI Comments: Erin Huff is a 59 y.o. female with a hx of DM, hypothyroidism, obesity and HTN who [facility] complaining of gradual onset left-sided headache that started 3 days ago. Patient reports having associated fatigue and LLQ abdominal pain. She was recently diagnosed with DM 3 months ago and has been checking her blood sugars since then. Patient is on metformin 500 mg BID, levothyroxine 112 mcg, glimepiride QD, and lisinopril-HCTZ 20-12.5 mg QD. Patient denies fever, cough, neck pain, neck stiffness, jaw pain, urinary symptoms and diarrhea.  Review of Systems  Constitutional: Positive for fatigue. Negative for fever.  Respiratory: Negative for cough.   Gastrointestinal: Positive for abdominal pain. Negative for diarrhea.  Genitourinary: Negative.   Musculoskeletal: Negative for neck pain and neck stiffness.  Neurological: Positive for headaches.       Objective:   Physical Exam CONSTITUTIONAL: Well developed/well nourished HEAD: Normocephalic/atraumatic EYES: EOM/PERRL ENMT: Mucous membranes moist NECK: supple no meningeal signs SPINE: entire spine nontender CV: S1/S2 noted, no murmurs/rubs/gallops noted LUNGS: LLQ abdominal tenderness with deep palpation. ABDOMEN: soft, nontender, no rebound or guarding GU: no cva tenderness NEURO: Pt is awake/alert, moves all extremitiesx4 EXTREMITIES: pulses normal, full ROM SKIN: warm, color normal PSYCH: no abnormalities of mood noted  Results for orders placed or performed in visit on 05/03/14  POCT CBC  Result Value Ref Range   WBC 6.7 4.6 - 10.2 K/uL   Lymph, poc 1.8 0.6 - 3.4   POC LYMPH PERCENT 26.6 10 -  50 %L   MID (cbc) 0.4 0 - 0.9   POC MID % 5.4 0 - 12 %M   POC Granulocyte 4.6 2 - 6.9   Granulocyte percent 68.0 37 - 80 %G   RBC 4.39 4.04 - 5.48 M/uL   Hemoglobin 12.4 12.2 - 16.2 g/dL   HCT, POC 40.9 81.1 - 47.9 %   MCV 88.5 80 - 97 fL   MCH, POC 28.2 27 - 31.2 pg   MCHC 31.9 31.8 - 35.4 g/dL   RDW, POC 91.4 %   Platelet Count, POC 222 142 - 424 K/uL   MPV 7.4 0 - 99.8 fL  POCT glucose (manual entry)  Result Value Ref Range   POC Glucose 124 (A) 70 - 99 mg/dl  POCT urinalysis dipstick  Result Value Ref Range   Color, UA yellow    Clarity, UA hazy    Glucose, UA neg    Bilirubin, UA neg    Ketones, UA neg    Spec Grav, UA 1.015    Blood, UA trace    pH, UA 6.0    Protein, UA neg    Urobilinogen, UA 0.2    Nitrite, UA neg    Leukocytes, UA small (1+)       Assessment & Plan:   This chart was scribed in my presence and reviewed by me personally.    ICD-9-CM ICD-10-CM   1. Nonintractable headache, unspecified chronicity pattern, unspecified headache type 784.0 R51 POCT CBC     butalbital-acetaminophen-caffeine (FIORICET) 50-325-40 MG per tablet  2. Low back pain without sciatica, unspecified back pain laterality 724.2 M54.5 POCT glucose (manual entry)  POCT urinalysis dipstick  3. Diabetes mellitus with no complication 250.00 E11.9 POCT CBC     POCT urinalysis dipstick  4. Pyuria 791.9 N39.0 ciprofloxacin (CIPRO) 250 MG tablet  5. LLQ abdominal pain 789.04 R10.32 ciprofloxacin (CIPRO) 250 MG tablet     Signed, Elvina SidleKurt Lauenstein, MD

## 2015-01-14 ENCOUNTER — Inpatient Hospital Stay (HOSPITAL_COMMUNITY)
Admission: EM | Admit: 2015-01-14 | Discharge: 2015-01-15 | DRG: 813 | Disposition: A | Discharge status: Home / Self Care | Payer: Self-pay | Attending: Internal Medicine | Admitting: Internal Medicine

## 2015-01-14 ENCOUNTER — Encounter (HOSPITAL_COMMUNITY): Payer: Self-pay | Admitting: Emergency Medicine

## 2015-01-14 DIAGNOSIS — R519 Headache, unspecified: Secondary | ICD-10-CM

## 2015-01-14 DIAGNOSIS — E871 Hypo-osmolality and hyponatremia: Secondary | ICD-10-CM | POA: Diagnosis present

## 2015-01-14 DIAGNOSIS — E039 Hypothyroidism, unspecified: Secondary | ICD-10-CM | POA: Diagnosis present

## 2015-01-14 DIAGNOSIS — R21 Rash and other nonspecific skin eruption: Secondary | ICD-10-CM | POA: Diagnosis present

## 2015-01-14 DIAGNOSIS — E119 Type 2 diabetes mellitus without complications: Secondary | ICD-10-CM

## 2015-01-14 DIAGNOSIS — R51 Headache: Secondary | ICD-10-CM

## 2015-01-14 DIAGNOSIS — D692 Other nonthrombocytopenic purpura: Secondary | ICD-10-CM

## 2015-01-14 DIAGNOSIS — I10 Essential (primary) hypertension: Secondary | ICD-10-CM | POA: Diagnosis present

## 2015-01-14 DIAGNOSIS — D696 Thrombocytopenia, unspecified: Principal | ICD-10-CM | POA: Diagnosis present

## 2015-01-14 LAB — COMPREHENSIVE METABOLIC PANEL
ALT: 38 U/L (ref 14–54)
AST: 42 U/L — AB (ref 15–41)
Albumin: 4.1 g/dL (ref 3.5–5.0)
Alkaline Phosphatase: 98 U/L (ref 38–126)
Anion gap: 10 (ref 5–15)
BUN: 14 mg/dL (ref 6–20)
CHLORIDE: 96 mmol/L — AB (ref 101–111)
CO2: 20 mmol/L — ABNORMAL LOW (ref 22–32)
Calcium: 9.3 mg/dL (ref 8.9–10.3)
Creatinine, Ser: 0.82 mg/dL (ref 0.44–1.00)
GFR calc non Af Amer: >60 mL/min (ref >60)
Glucose, Bld: 129 mg/dL — ABNORMAL HIGH (ref 65–99)
POTASSIUM: 3.9 mmol/L (ref 3.5–5.1)
Sodium: 126 mmol/L — ABNORMAL LOW (ref 135–145)
Total Bilirubin: 0.8 mg/dL (ref 0.3–1.2)
Total Protein: 7.7 g/dL (ref 6.5–8.1)

## 2015-01-14 LAB — CBC WITH DIFFERENTIAL/PLATELET
Basophils Absolute: 0 10*3/uL (ref 0.0–0.1)
Basophils Relative: 0 %
Eosinophils Absolute: 0.1 10*3/uL (ref 0.0–0.7)
Eosinophils Relative: 1 %
HCT: 35.5 % — ABNORMAL LOW (ref 36.0–46.0)
HEMOGLOBIN: 12.5 g/dL (ref 12.0–15.0)
LYMPHS ABS: 2.1 10*3/uL (ref 0.7–4.0)
Lymphocytes Relative: 24 %
MCH: 29.5 pg (ref 26.0–34.0)
MCHC: 35.2 g/dL (ref 30.0–36.0)
MCV: 83.7 fL (ref 78.0–100.0)
Monocytes Absolute: 0.5 10*3/uL (ref 0.1–1.0)
Monocytes Relative: 6 %
NEUTROS ABS: 6 10*3/uL (ref 1.7–7.7)
NEUTROS PCT: 69 %
Platelets: 60 10*3/uL — ABNORMAL LOW (ref 150–400)
RBC: 4.24 MIL/uL (ref 3.87–5.11)
RDW: 12.5 % (ref 11.5–15.5)
WBC: 8.7 10*3/uL (ref 4.0–10.5)

## 2015-01-14 LAB — URINALYSIS, DIPSTICK ONLY
BILIRUBIN URINE: NEGATIVE
Glucose, UA: NEGATIVE mg/dL
Hgb urine dipstick: NEGATIVE
KETONES UR: 15 mg/dL — AB
Leukocytes, UA: NEGATIVE
NITRITE: NEGATIVE
PH: 6.5 (ref 5.0–8.0)
Protein, ur: NEGATIVE mg/dL
Specific Gravity, Urine: 1.007 (ref 1.005–1.030)

## 2015-01-14 LAB — BASIC METABOLIC PANEL
Anion gap: 10 (ref 5–15)
BUN: 13 mg/dL (ref 6–20)
CALCIUM: 8.9 mg/dL (ref 8.9–10.3)
CHLORIDE: 99 mmol/L — AB (ref 101–111)
CO2: 20 mmol/L — AB (ref 22–32)
CREATININE: 0.7 mg/dL (ref 0.44–1.00)
GFR calc Af Amer: >60 mL/min (ref >60)
GFR calc non Af Amer: >60 mL/min (ref >60)
GLUCOSE: 104 mg/dL — AB (ref 65–99)
Potassium: 4.1 mmol/L (ref 3.5–5.1)
Sodium: 129 mmol/L — ABNORMAL LOW (ref 135–145)

## 2015-01-14 LAB — PROTIME-INR
INR: 1.04 (ref 0.00–1.49)
Prothrombin Time: 13.8 seconds (ref 11.6–15.2)

## 2015-01-14 LAB — SAVE SMEAR

## 2015-01-14 LAB — TSH: TSH: 1.424 u[IU]/mL (ref 0.350–4.500)

## 2015-01-14 LAB — APTT: aPTT: 28 seconds (ref 24–37)

## 2015-01-14 LAB — OSMOLALITY: Osmolality: 270 mOsm/kg — ABNORMAL LOW (ref 275–295)

## 2015-01-14 MED ORDER — SODIUM CHLORIDE 0.9 % IV BOLUS (SEPSIS)
1000.0000 mL | Freq: Once | INTRAVENOUS | Status: AC
  Administered 2015-01-14: 1000 mL via INTRAVENOUS

## 2015-01-14 MED ORDER — CLINDAMYCIN HCL 300 MG PO CAPS
300.0000 mg | ORAL_CAPSULE | Freq: Three times a day (TID) | ORAL | Status: DC
  Administered 2015-01-14 – 2015-01-15 (×2): 300 mg via ORAL
  Filled 2015-01-14 (×2): qty 1

## 2015-01-14 MED ORDER — SODIUM CHLORIDE 0.9 % IV SOLN
INTRAVENOUS | Status: DC
  Administered 2015-01-14: 18:00:00 via INTRAVENOUS

## 2015-01-14 MED ORDER — ACETAMINOPHEN 325 MG PO TABS
650.0000 mg | ORAL_TABLET | Freq: Once | ORAL | Status: AC
  Administered 2015-01-14: 650 mg via ORAL
  Filled 2015-01-14: qty 2

## 2015-01-14 NOTE — ED Provider Notes (Signed)
CSN: [id number]     Arrival date & time 01/14/15  1508 History   First MD Initiated Contact with Patient 01/14/15 1514     Chief Complaint  Patient presents with  . Rash    Patient is a 59 y.o. female presenting with rash. The history is provided by the patient. The history is limited by a language barrier. A language interpreter was used.  Rash Location:  Torso, leg and shoulder/arm Shoulder/arm rash location:  L arm and R arm Torso rash location:  Upper back, abd RLQ and abd LLQ Leg rash location:  L leg and R leg Quality comment:  Petechial Severity:  Moderate Onset quality:  Gradual Duration:  3 days Timing:  Constant Progression:  Spreading Chronicity:  New Context comment:  Started PCN recently Relieved by:  Nothing Associated symptoms: headaches   Associated symptoms: no abdominal pain, no fever, no nausea, no shortness of breath, no sore throat, no tongue swelling, not vomiting and not wheezing     Past Medical History  Diagnosis Date  . Hypertension   . Diabetes mellitus without complication (HCC)   . Thyroid disease    History reviewed. No pertinent past surgical history. No family history on file. Social History  Substance Use Topics  . Smoking status: Never Smoker   . Smokeless tobacco: None  . Alcohol Use: No   OB History    No data available     Review of Systems  Constitutional: Negative for fever.  HENT: Negative for rhinorrhea and sore throat.   Eyes: Negative for visual disturbance.  Respiratory: Negative for chest tightness, shortness of breath and wheezing.   Cardiovascular: Negative for chest pain and palpitations.  Gastrointestinal: Negative for nausea, vomiting, abdominal pain and constipation.  Genitourinary: Negative for dysuria and hematuria.  Musculoskeletal: Negative for back pain and neck pain.  Skin: Positive for rash.  Neurological: Positive for headaches. Negative for dizziness.  Psychiatric/Behavioral: Negative for confusion.   All other systems reviewed and are negative.     Allergies  Penicillins  Home Medications   Prior to Admission medications   Medication Sig Start Date End Date Taking? Authorizing Provider  glimepiride (AMARYL) 2 MG tablet Take 2 mg by mouth daily with breakfast.   Yes Historical Provider, MD  levothyroxine (SYNTHROID, LEVOTHROID) 112 MCG tablet Take 112 mcg by mouth daily before breakfast.   Yes Historical Provider, MD  lisinopril-hydrochlorothiazide (PRINZIDE,ZESTORETIC) 20-12.5 MG per tablet Take 1 tablet by mouth daily.   Yes Historical Provider, MD  metFORMIN (GLUCOPHAGE) 500 MG tablet Take by mouth 2 (two) times daily with a meal.   Yes Historical Provider, MD  penicillin v potassium (VEETID) 500 MG tablet Take 500 mg by mouth 2 (two) times daily.   Yes Historical Provider, MD  butalbital-acetaminophen-caffeine (FIORICET) 50-325-40 MG per tablet Take 1-2 tablets by mouth every 6 (six) hours as needed for headache. 05/03/14 05/03/15  Elvina Sidle, MD  ciprofloxacin (CIPRO) 250 MG tablet Take 1 tablet (250 mg total) by mouth 2 (two) times daily. Patient not taking: Reported on 01/14/2015 05/03/14   Elvina Sidle, MD   BP 144/74 mmHg  Pulse 115  Temp(Src) 99.3 F (37.4 C) (Oral)  Resp 18  SpO2 100% Physical Exam  Constitutional: She is oriented to person, place, and time. She appears well-developed and well-nourished. No distress.  HENT:  Head: Normocephalic and atraumatic.  Mouth/Throat: Oropharynx is clear and moist.  Eyes: EOM are normal. Pupils are equal, round, and reactive to light.  Neck:  Neck supple. No JVD present.  Cardiovascular: Normal rate, regular rhythm, normal heart sounds and intact distal pulses.  Exam reveals no gallop.   No murmur heard. Pulmonary/Chest: Effort normal and breath sounds normal. She has no wheezes. She has no rales.  Abdominal: Soft. She exhibits no distension. There is no tenderness.  Musculoskeletal: Normal range of motion. She  exhibits no tenderness.  Neurological: She is alert and oriented to person, place, and time. No cranial nerve deficit. She exhibits normal muscle tone.  Skin: Skin is warm and dry. Petechiae and rash noted.  Petechia bilateral lower extremities, abdomen, back, bilateral upper extremities  Psychiatric: Her behavior is normal.    ED Course  Procedures  None   Labs Review Labs Reviewed  COMPREHENSIVE METABOLIC PANEL - Abnormal; Notable for the following:    Sodium 126 (*)    Chloride 96 (*)    CO2 20 (*)    Glucose, Bld 129 (*)    AST 42 (*)    All other components within normal limits  CBC WITH DIFFERENTIAL/PLATELET - Abnormal; Notable for the following:    HCT 35.5 (*)    Platelets 60 (*)    All other components within normal limits  URINALYSIS, DIPSTICK ONLY - Abnormal; Notable for the following:    Ketones, ur 15 (*)    All other components within normal limits  PROTIME-INR  APTT  TSH  SAVE SMEAR  LYME DISEASE DNA BY PCR(BORRELIA BURG)  BASIC METABOLIC PANEL  PATHOLOGIST SMEAR REVIEW  CBC WITH DIFFERENTIAL/PLATELET  BASIC METABOLIC PANEL  HIV ANTIBODY (ROUTINE TESTING)  OSMOLALITY  OSMOLALITY, URINE  SODIUM, URINE, RANDOM   MDM   Final diagnoses:  Thrombocytopenia (HCC)  Purpura (HCC)  Hyponatremia    Patient is a 59 year old Hispanic female with a history of hypertension, diabetes, thyroid disease presents with a petechial rash for 3 days. Patient states she just started taking penicillin after having a left great toenail removed. Nausea and any other new medications. She denies fevers, chills, night sweats, weight loss. Patient appears well clinically. She is afebrile and tachycardic to 115 but otherwise medically stable. She has a petechial rash on her bilateral lower extremities, bilateral upper extremities, chest, and back. Labs show thrombocytopenia with a platelet count of 60. Patient is also hyponatremic. Will give IVFs. Patient denies any recent tick  exposure, international travel, sick contacts. Will admit for further workup and management.  Discussed with Dr. Carmina MillerBeason.    Maris BergerJonah Maxine Huynh, MD 01/14/15 1738  Nelva Nayobert Beaton, MD 01/14/15 1745

## 2015-01-14 NOTE — ED Notes (Signed)
Pt from home for rash to bilateral legs and trunk and shoulders. Pt had nail removed from right great toe and has been taking meds for this and noticed rash on Monday morning. Pt HR noted to be elevated and low grade fever. Airway intact. nad noted.

## 2015-01-14 NOTE — Progress Notes (Signed)
NURSING PROGRESS NOTE  Erin Huff [id number] Admission Data: 01/14/2015 6:46 PM Attending Provider: [name], MD PCP:No PCP Per Patient Code Status: FULL   Erin Huff is a 59 y.o. female patient admitted from ED:  -No acute distress noted.  -No complaints of shortness of breath.  -No complaints of chest pain.   Cardiac Monitoring: None  Blood pressure 152/78, pulse 110, temperature 99.3 F (37.4 C), temperature source Oral, resp. rate 16, SpO2 100 %.   IV Fluids:  IV in place, occlusive dsg intact without redness, IV cath forearm right, condition patent and no redness normal saline.   Allergies:  Penicillins  Past Medical History:   has a past medical history of Hypertension; Diabetes mellitus without complication (HCC); and Thyroid disease.  Past Surgical History:   has no past surgical history on file.  Social History:   reports that she has never smoked. She does not have any smokeless tobacco history on file. She reports that she does not drink alcohol or use illicit drugs.  Skin: Intact. Tiny red specks on legs bilaterally (Right more than left)  Patient/Family orientated to room. Information packet given to patient/family. Admission inpatient armband information verified with patient/family to include name and date of birth and placed on patient arm. Side rails up x 2, fall assessment and education completed with patient/family. Patient/family able to verbalize understanding of risk associated with falls and verbalized understanding to call for assistance before getting out of bed. Call light within reach. Patient/family able to voice and demonstrate understanding of unit orientation instructions.    Will continue to evaluate and treat per MD orders.  Bennie Pieriniyndi Adeleigh Barletta, RN

## 2015-01-14 NOTE — H&P (Signed)
History and Physical    Jonathon Residesntonieta Huff ZOX:[id number] DOB: [birth year] DOA: 01/14/2015  Referring physician: Dr. Margreta JourneyGunalda PCP: No PCP Per Patient  Specialists: none  Chief Complaint: rash  HPI: Erin Huff is a 59 y.o. female has a past medical history significant for hypertension, diabetes, hypothyroidism, presents to the emergency room with chief complaint of a rash. Patient has noticed this rash progressively getting worse over the last couple of days, she noticed a rash in bilateral lower extremities as well as on her stomach area. She had part of her right great toe nail removed about 4-5 days ago, and has been taking penicillin for that. After starting the penicillin, she has noticed this rash. She denies any fever or chills, denies any abdominal pain, she denies any chest pain, she has no nausea vomiting or diarrhea. She never had a rash like this before. She denies any dysuria. She reports compliance with all of her home medications, and reports no new prescription medications other than the antibiotic. She denies any over-the-counter medications, however there are some reports in the ED notes that she's been taking ibuprofen. In the ED, she was found to be trauma cytopenia with a platelet of 60, and this is new, she was also found to be hyponatremic with a sodium of 126. Her vital signs are stable, and [facility] was asked for admission for trauma cytopenia and hyponatremia.  Review of Systems: As per history of present illness, otherwise 10 point review of systems negative  Past Medical History  Diagnosis Date  . Hypertension   . Diabetes mellitus without complication (HCC)   . Thyroid disease    History reviewed. No pertinent past surgical history. Social History:  reports that she has never smoked. She does not have any smokeless tobacco history on file. She reports that she does not drink alcohol or use illicit drugs.  Allergies  Allergen Reactions  .  Penicillins Rash   Family history reviewed and noncontributory   Prior to Admission medications   Medication Sig Start Date End Date Taking? Authorizing Provider  glimepiride (AMARYL) 2 MG tablet Take 2 mg by mouth daily with breakfast.   Yes Historical Provider, MD  levothyroxine (SYNTHROID, LEVOTHROID) 112 MCG tablet Take 112 mcg by mouth daily before breakfast.   Yes Historical Provider, MD  lisinopril-hydrochlorothiazide (PRINZIDE,ZESTORETIC) 20-12.5 MG per tablet Take 1 tablet by mouth daily.   Yes Historical Provider, MD  metFORMIN (GLUCOPHAGE) 500 MG tablet Take by mouth 2 (two) times daily with a meal.   Yes Historical Provider, MD  penicillin v potassium (VEETID) 500 MG tablet Take 500 mg by mouth 2 (two) times daily.   Yes Historical Provider, MD  butalbital-acetaminophen-caffeine (FIORICET) 50-325-40 MG per tablet Take 1-2 tablets by mouth every 6 (six) hours as needed for headache. 05/03/14 05/03/15  Elvina SidleKurt Lauenstein, MD  ciprofloxacin (CIPRO) 250 MG tablet Take 1 tablet (250 mg total) by mouth 2 (two) times daily. Patient not taking: Reported on 01/14/2015 05/03/14   Elvina SidleKurt Lauenstein, MD   Physical Exam: Filed Vitals:   01/14/15 1528 01/14/15 1705  BP: 144/74 152/78  Pulse: 115 110  Temp: 99.3 F (37.4 C)   TempSrc: Oral   Resp: 18 16  SpO2: 100% 100%     GENERAL: NAD  HEENT: head NCAT, no scleral icterus. Pupils round and reactive.   NECK: Supple.  LUNGS: Clear to auscultation. No wheezing or crackles  HEART: Regular rate and rhythm without murmur. 2+ pulses, no JVD, no peripheral edema  ABDOMEN: Soft, nontender, and nondistended. Positive bowel sounds.  EXTREMITIES: Without any cyanosis, clubbing, rash, lesions or edema.  NEUROLOGIC: Alert and oriented x3. Cranial nerves II through XII are grossly intact. Strength 5/5 in all 4.  PSYCHIATRIC: Appears anxious   SKIN: No ulceration or induration present. Petechial rash, nonraised, nonpruritic on bilateral lower  extremities as well as on her abdominal area. Rash is nonblanching.   Labs on Admission:  Basic Metabolic Panel:  Recent Labs Lab 01/14/15 1559  NA 126*  K 3.9  CL 96*  CO2 20*  GLUCOSE 129*  BUN 14  CREATININE 0.82  CALCIUM 9.3   Liver Function Tests:  Recent Labs Lab 01/14/15 1559  AST 42*  ALT 38  ALKPHOS 98  BILITOT 0.8  PROT 7.7  ALBUMIN 4.1   CBC:  Recent Labs Lab 01/14/15 1559  WBC 8.7  NEUTROABS 6.0  HGB 12.5  HCT 35.5*  MCV 83.7  PLT 60*   Radiological Exams on Admission: No results found.  EKG: Independently reviewed.  Assessment/Plan Active Problems:   Hypothyroidism   Diabetes mellitus (HCC)   HYPERTENSION, BENIGN SYSTEMIC   SKIN RASH   Thrombocytopenia (HCC)   Hyponatremia   Thrombocytopenia - This is all new, it appears to be medication induced given onset after she started taking antibiotics. - We'll closely monitor and repeat CBC in the morning, she has no active bleeding right now - Obtain peripheral smear - Hold heparin agents, SCD for prophylaxis - If he continues to drop, may need hematology consult - Hold offending agent, change antibiotics to clindamycin  Hyponatremia - Likely due to dehydration, patient endorses poor by mouth intake over the last few days, we'll provide normal saline and monitor sodium later tonight and tomorrow morning. - Obtain serum osmolality as well as urine osmolality and urine sodium  Skin rash - This is consistent with petechial rash that can be seen in the setting of thrombocytopenia  Diabetes mellitus - Which her hemoglobin A1c, continue home medications  Hypothyroidism - Heart rate 100-105 in the ED, she did TSH  Hypertension - Resume lisinopril, hold hydrochlorothiazide in the setting of hyponatremia   Diet: diabetic Fluids: NS DVT Prophylaxis: SCD  Code Status: Full  Family Communication: d/w daughter bedside  Disposition Plan: admit to [facility]. Elvera Lennox, MD [facility] Pager [phone]  If 7PM-7AM, please contact night-coverage [email or web address] Password [facility] 01/14/2015, 5:35 PM

## 2015-01-14 NOTE — ED Provider Notes (Signed)
Medical Screening Exam  Pt placed in Fast Track for "rash" - pt has concerning rash that is not pruritic, not painful, located on her lower extremities, abdomen, and her mouth.  Had toenail removed 4 days ago, started taking ibuprofen, woke up two days ago with this rash all over.    Pt stable for move to Main ED.  CBC, CMP, coags ordered.    Trixie Dredgemily Leigh Kaeding, PA-C 01/14/15 1534  Margarita Grizzleanielle Ray, MD 01/14/15 [phone]

## 2015-01-15 LAB — CBC WITH DIFFERENTIAL/PLATELET
BASOS ABS: 0 10*3/uL (ref 0.0–0.1)
Basophils Relative: 0 %
Eosinophils Absolute: 0.3 10*3/uL (ref 0.0–0.7)
Eosinophils Relative: 4 %
HEMATOCRIT: 35.3 % — AB (ref 36.0–46.0)
HEMOGLOBIN: 12.2 g/dL (ref 12.0–15.0)
LYMPHS PCT: 42 %
Lymphs Abs: 3 10*3/uL (ref 0.7–4.0)
MCH: 29 pg (ref 26.0–34.0)
MCHC: 34.6 g/dL (ref 30.0–36.0)
MCV: 84 fL (ref 78.0–100.0)
MONO ABS: 0.4 10*3/uL (ref 0.1–1.0)
Monocytes Relative: 6 %
NEUTROS ABS: 3.4 10*3/uL (ref 1.7–7.7)
NEUTROS PCT: 48 %
Platelets: 76 10*3/uL — ABNORMAL LOW (ref 150–400)
RBC: 4.2 MIL/uL (ref 3.87–5.11)
RDW: 12.6 % (ref 11.5–15.5)
WBC: 7.1 10*3/uL (ref 4.0–10.5)

## 2015-01-15 LAB — BASIC METABOLIC PANEL
ANION GAP: 9 (ref 5–15)
BUN: 11 mg/dL (ref 6–20)
CHLORIDE: 106 mmol/L (ref 101–111)
CO2: 20 mmol/L — AB (ref 22–32)
Calcium: 9.2 mg/dL (ref 8.9–10.3)
Creatinine, Ser: 0.7 mg/dL (ref 0.44–1.00)
GFR calc non Af Amer: >60 mL/min (ref >60)
GLUCOSE: 107 mg/dL — AB (ref 65–99)
POTASSIUM: 4.1 mmol/L (ref 3.5–5.1)
Sodium: 135 mmol/L (ref 135–145)

## 2015-01-15 LAB — HIV ANTIBODY (ROUTINE TESTING W REFLEX): HIV Screen 4th Generation wRfx: NONREACTIVE

## 2015-01-15 MED ORDER — CLINDAMYCIN HCL 300 MG PO CAPS: 300.0000 mg | ORAL_CAPSULE | Freq: Three times a day (TID) | ORAL | Status: DC

## 2015-01-15 MED ORDER — INFLUENZA VAC SPLIT QUAD 0.5 ML IM SUSY
0.5000 mL | PREFILLED_SYRINGE | INTRAMUSCULAR | Status: AC
  Administered 2015-01-15: 0.5 mL via INTRAMUSCULAR

## 2015-01-15 MED ORDER — BUTALBITAL-APAP-CAFFEINE 50-325-40 MG PO TABS: 1.0000 | ORAL_TABLET | Freq: Four times a day (QID) | ORAL | Status: AC | PRN

## 2015-01-15 NOTE — Discharge Instructions (Signed)
Follow with [facility] Wellness center in 1-2 weeks  Stop Penicillin Avoid Ibuprofen, Naproxen and other NSAIDs. If you are not sure which medications are NSAIDs, check with your pharmacist.   Please get a complete blood count and chemistry panel checked by your Primary MD at your next visit, and again as instructed by your Primary MD. Please get your medications reviewed and adjusted by your Primary MD.  Please request your Primary MD to go over all Hospital Tests and Procedure/Radiological results at the follow up, please get all Hospital records sent to your Prim MD by signing hospital release before you go home.  If you had Pneumonia of Lung problems at the Hospital: Please get a 2 view Chest X ray done in 6-8 weeks after hospital discharge or sooner if instructed by your Primary MD.  If you have Congestive Heart Failure: Please call your Cardiologist or Primary MD anytime you have any of the following symptoms:  1) 3 pound weight gain in 24 hours or 5 pounds in 1 week  2) shortness of breath, with or without a dry hacking cough  3) swelling in the hands, feet or stomach  4) if you have to sleep on extra pillows at night in order to breathe  Follow cardiac low salt diet and 1.5 lit/day fluid restriction.  If you have diabetes Accuchecks 4 times/day, Once in AM empty stomach and then before each meal. Log in all results and show them to your primary doctor at your next visit. If any glucose reading is under 80 or above 300 call your primary MD immediately.  If you have Seizure/Convulsions/Epilepsy: Please do not drive, operate heavy machinery, participate in activities at heights or participate in high speed sports until you have seen by Primary MD or a Neurologist and advised to do so again.  If you had Gastrointestinal Bleeding: Please ask your Primary MD to check a complete blood count within one week of discharge or at your next visit. Your endoscopic/colonoscopic  biopsies that are pending at the time of discharge, will also need to followed by your Primary MD.  Get Medicines reviewed and adjusted. Please take all your medications with you for your next visit with your Primary MD  Please request your Primary MD to go over all hospital tests and procedure/radiological results at the follow up, please ask your Primary MD to get all Hospital records sent to his/her office.  If you experience worsening of your admission symptoms, develop shortness of breath, life threatening emergency, suicidal or homicidal thoughts you must seek medical attention immediately by calling 911 or calling your MD immediately  if symptoms less severe.  You must read complete instructions/literature along with all the possible adverse reactions/side effects for all the Medicines you take and that have been prescribed to you. Take any new Medicines after you have completely understood and accpet all the possible adverse reactions/side effects.   Do not drive or operate heavy machinery when taking Pain medications.   Do not take more than prescribed Pain, Sleep and Anxiety Medications  Special Instructions: If you have smoked or chewed Tobacco  in the last 2 yrs please stop smoking, stop any regular Alcohol  and or any Recreational drug use.  Wear Seat belts while driving.  Please note You were cared for by a hospitalist during your hospital stay. If you have any questions about your discharge medications or the care you received while you were in the hospital after you are discharged,  you can call the unit and asked to speak with the hospitalist on call if the hospitalist that took care of you is not available. Once you are discharged, your primary care physician will handle any further medical issues. Please note that NO REFILLS for any discharge medications will be authorized once you are discharged, as it is imperative that you return to your primary care physician (or establish a  relationship with a primary care physician if you do not have one) for your aftercare needs so that they can reassess your need for medications and monitor your lab values.  You can reach the hospitalist office at phone [phone] or fax [phone]   If you do not have a primary care physician, you can call [phone] for a physician referral.  Activity: As tolerated with Full fall precautions use walker/cane & assistance as needed  Diet: diabetic  Disposition Home

## 2015-01-15 NOTE — Progress Notes (Signed)
NURSING PROGRESS NOTE  Erin Huff [id number] Discharge Data: 01/15/2015 8:02 AM Attending Provider: [name], MD PCP:No PCP Per Patient   Erin DanesAntonieta Huff to be D/C'd Home per MD order.    All IV's will be discontinued and monitored for bleeding.  All belongings will be returned to patient for patient to take home.  Last Documented Vital Signs:  Blood pressure 96/60, pulse 78, temperature 97.8 F (36.6 C), temperature source Oral, resp. rate 18, height 5\' 1"  (1.549 m), weight 76.318 kg (168 lb 4 oz), SpO2 100 %.  Madelin RearLonnie [name], MSN, RN, [employer]RN

## 2015-01-15 NOTE — Discharge Summary (Signed)
Physician Discharge Summary  Erin Huff ZOX:[id number] DOB: Aug 07, 1955 DOA: 01/14/2015  PCP: No PCP Per Patient  Admit date: 01/14/2015 Discharge date: 01/15/2015  Time spent: < 30 minutes  Recommendations for Outpatient Follow-up:  1. Follow up with PCP in 1-2 weeks.   Discharge Diagnoses:  Active Problems:   Hypothyroidism   Diabetes mellitus (HCC)   HYPERTENSION, BENIGN SYSTEMIC   SKIN RASH   Thrombocytopenia (HCC)   Hyponatremia  Discharge Condition: stable  Diet recommendation: diabetic  Filed Weights   01/15/15 0100  Weight: 76.318 kg (168 lb 4 oz)    History of present illness:  See H&P, Labs, Consult and Test reports for all details in brief, patient is a 59 y.o. female has a past medical history significant for hypertension, diabetes, hypothyroidism, presents to the emergency room with chief complaint of a rash, found to be thrombocytopenic.  Hospital Course:  Patient was admitted to the hospital with new onset thrombocytopenia as well as a typical rash on her lower extremities as well as her abdomen, this is likely medication induced as this started after she took antibiotics for toenail infection, as well as over-the-counter ibuprofen. Offending agents were discontinued, and her platelets started to gradually improve. She was also found to be hyponatremic likely due to poor by mouth intake at home prior to admission, she received IV fluids and her sodium normalized by the next day. Patient has remained clinically stable, no evidence of bleeding or any other rashes, she was feeling back to baseline, able to ambulate without any significant issues. She was discharged home in stable condition, and advised to follow-up with her primary doctor within one week. Her antibiotics were changed to clindamycin which she is to continue and complete a short course of 3 additional days.  Procedures:  None   Consultations:  None   Discharge Exam: Filed  Vitals:   01/14/15 1848 01/14/15 2128 01/15/15 0100 01/15/15 0601  BP: 121/50 100/57  96/60  Pulse: 93 91  78  Temp: 99 F (37.2 C) 97.8 F (36.6 C)  97.8 F (36.6 C)  TempSrc: Oral   Oral  Resp: Height:    (1.549 m)   Weight:   76.318 kg (168 lb 4 oz)   SpO2: 98% 98%  100%    General: NAD Cardiovascular: RRR Respiratory: CTA biL  Discharge Instructions Activity:  As tolerated   Get Medicines reviewed and adjusted: Please take all your medications with you for your next visit with your Primary MD  Please request your Primary MD to go over all hospital tests and procedure/radiological results at the follow up, please ask your Primary MD to get all Hospital records sent to his/her office.  If you experience worsening of your admission symptoms, develop shortness of breath, life threatening emergency, suicidal or homicidal thoughts you must seek medical attention immediately by calling 911 or calling your MD immediately if symptoms less severe.  You must read complete instructions/literature along with all the possible adverse reactions/side effects for all the Medicines you take and that have been prescribed to you. Take any new Medicines after you have completely understood and accpet all the possible adverse reactions/side effects.   Do not drive when taking Pain medications.   Do not take more than prescribed Pain, Sleep and Anxiety Medications  Special Instructions: If you have smoked or chewed Tobacco in the last 2 yrs please stop smoking, stop any regular Alcohol and or any Recreational drug use.  Wear Seat belts while driving.  Please note  You were cared for by a hospitalist during your hospital stay. Once you are discharged, your primary care physician will handle any further medical issues. Please note that NO REFILLS for any discharge medications will be authorized once you are discharged, as it is imperative that you return to your primary care  physician (or establish a relationship with a primary care physician if you do not have one) for your aftercare needs so that they can reassess your need for medications and monitor your lab values.    Medication List    STOP taking these medications        ciprofloxacin 250 MG tablet  Commonly known as:  CIPRO     penicillin v potassium 500 MG tablet  Commonly known as:  VEETID      TAKE these medications        butalbital-acetaminophen-caffeine 50-325-40 MG tablet  Commonly known as:  FIORICET  Take 1-2 tablets by mouth every 6 (six) hours as needed for headache.     clindamycin 300 MG capsule  Commonly known as:  CLEOCIN  Take 1 capsule (300 mg total) by mouth every 8 (eight) hours.     glimepiride 2 MG tablet  Commonly known as:  AMARYL  Take 2 mg by mouth daily with breakfast.     levothyroxine 112 MCG tablet  Commonly known as:  SYNTHROID, LEVOTHROID  Take 112 mcg by mouth daily before breakfast.     lisinopril-hydrochlorothiazide 20-12.5 MG tablet  Commonly known as:  PRINZIDE,ZESTORETIC  Take 1 tablet by mouth daily.     metFORMIN 500 MG tablet  Commonly known as:  GLUCOPHAGE  Take by mouth 2 (two) times daily with a meal.           Follow-up Information    Follow up with [facility]. Schedule an appointment as soon as possible for a visit in 1 week.   Contact information:   [address] [place] [id number] [phone]      The results of significant diagnostics from this hospitalization (including imaging, microbiology, ancillary and laboratory) are listed below for reference.    Significant Diagnostic Studies: No results found.    Labs: Basic Metabolic Panel:  Recent Labs Lab 01/14/15 1559 01/14/15 1929 01/15/15 0318  NA 126* 129* 135  K 3.9 4.1 4.1  CL 96* 99* 106  CO2 20* 20* 20*  GLUCOSE 129* 104* 107*  BUN 14 13 11   CREATININE 0.82 0.70 0.70  CALCIUM 9.3 8.9 9.2   Liver  Function Tests:  Recent Labs Lab 01/14/15 1559  AST 42*  ALT 38  ALKPHOS 98  BILITOT 0.8  PROT 7.7  ALBUMIN 4.1   CBC:  Recent Labs Lab 01/14/15 1559 01/15/15 0318  WBC 8.7 7.1  NEUTROABS 6.0 3.4  HGB 12.5 12.2  HCT 35.5* 35.3*  MCV 83.7 84.0  PLT 60* 76*     Signed:  GHERGHE, COSTIN  [facility] 01/15/2015, 1:41 PM

## 2015-01-15 NOTE — Progress Notes (Signed)
Patient discharge instruction given to daughter Jennell CornerMaricla at beside. Prescription given to daughter. No question or concerns verbalized.

## 2015-01-16 LAB — HEMOGLOBIN A1C
Hgb A1c MFr Bld: 7.7 % — ABNORMAL HIGH (ref 4.8–5.6)
MEAN PLASMA GLUCOSE: 174 mg/dL

## 2015-01-17 LAB — LYME DISEASE DNA BY PCR(BORRELIA BURG): LYME DISEASE(B. BURGDORFERI) PCR: NEGATIVE

## 2015-02-04 ENCOUNTER — Other Ambulatory Visit: Payer: Self-pay

## 2015-02-17 ENCOUNTER — Other Ambulatory Visit: Payer: Self-pay

## 2015-02-17 DIAGNOSIS — Z1231 Encounter for screening mammogram for malignant neoplasm of breast: Secondary | ICD-10-CM

## 2015-04-23 ENCOUNTER — Ambulatory Visit
Admission: RE | Admit: 2015-04-23 | Discharge: 2015-04-23 | Disposition: A | Discharge status: Home / Self Care | Payer: No Typology Code available for payment source | Source: Ambulatory Visit

## 2015-04-23 DIAGNOSIS — Z1231 Encounter for screening mammogram for malignant neoplasm of breast: Secondary | ICD-10-CM

## 2015-07-11 ENCOUNTER — Emergency Department (HOSPITAL_COMMUNITY)
Admission: EM | Admit: 2015-07-11 | Discharge: 2015-07-11 | Disposition: A | Discharge status: Home / Self Care | Payer: No Typology Code available for payment source | Attending: Emergency Medicine | Admitting: Emergency Medicine

## 2015-07-11 ENCOUNTER — Encounter (HOSPITAL_COMMUNITY): Payer: Self-pay | Admitting: Emergency Medicine

## 2015-07-11 DIAGNOSIS — Z7984 Long term (current) use of oral hypoglycemic drugs: Secondary | ICD-10-CM | POA: Insufficient documentation

## 2015-07-11 DIAGNOSIS — R739 Hyperglycemia, unspecified: Secondary | ICD-10-CM

## 2015-07-11 DIAGNOSIS — E1165 Type 2 diabetes mellitus with hyperglycemia: Secondary | ICD-10-CM | POA: Insufficient documentation

## 2015-07-11 DIAGNOSIS — I159 Secondary hypertension, unspecified: Secondary | ICD-10-CM

## 2015-07-11 DIAGNOSIS — Z79899 Other long term (current) drug therapy: Secondary | ICD-10-CM | POA: Insufficient documentation

## 2015-07-11 LAB — CBC WITH DIFFERENTIAL/PLATELET
BASOS ABS: 0 10*3/uL (ref 0.0–0.1)
BASOS PCT: 0 %
EOS ABS: 0.1 10*3/uL (ref 0.0–0.7)
Eosinophils Relative: 1 %
HCT: 38 % (ref 36.0–46.0)
HEMOGLOBIN: 13.1 g/dL (ref 12.0–15.0)
Lymphocytes Relative: 18 %
Lymphs Abs: 2.2 10*3/uL (ref 0.7–4.0)
MCH: 29.2 pg (ref 26.0–34.0)
MCHC: 34.5 g/dL (ref 30.0–36.0)
MCV: 84.8 fL (ref 78.0–100.0)
MONOS PCT: 4 %
Monocytes Absolute: 0.4 10*3/uL (ref 0.1–1.0)
NEUTROS PCT: 77 %
Neutro Abs: 9.2 10*3/uL — ABNORMAL HIGH (ref 1.7–7.7)
Platelets: 237 10*3/uL (ref 150–400)
RBC: 4.48 MIL/uL (ref 3.87–5.11)
RDW: 12.4 % (ref 11.5–15.5)
WBC: 12 10*3/uL — ABNORMAL HIGH (ref 4.0–10.5)

## 2015-07-11 LAB — BASIC METABOLIC PANEL
ANION GAP: 12 (ref 5–15)
BUN: 11 mg/dL (ref 6–20)
CALCIUM: 9.5 mg/dL (ref 8.9–10.3)
CHLORIDE: 97 mmol/L — AB (ref 101–111)
CO2: 20 mmol/L — ABNORMAL LOW (ref 22–32)
CREATININE: 0.72 mg/dL (ref 0.44–1.00)
Glucose, Bld: 236 mg/dL — ABNORMAL HIGH (ref 65–99)
Potassium: 4 mmol/L (ref 3.5–5.1)
SODIUM: 129 mmol/L — AB (ref 135–145)

## 2015-07-11 NOTE — ED Notes (Signed)
Pt. reports elevated blood pressure at home this morning 190-systolic with anxiety and chills.

## 2015-07-11 NOTE — ED Notes (Signed)
Tiffany, PA-C notified of decreased Sodium 129. States she is aware, and that pt may still be discharged at this time.

## 2015-07-11 NOTE — Discharge Instructions (Signed)
Hipertensin (Hypertension) La hipertensin, conocida comnmente como presin arterial alta, se produce cuando la sangre bombea en las arterias con mucha fuerza. Las arterias son los vasos sanguneos que transportan la sangre desde el corazn hacia todas las partes del cuerpo. Una lectura de la presin arterial consiste en un nmero ms alto sobre un nmero ms bajo, por ejemplo, 110/72. El nmero ms alto (presin sistlica) corresponde a la presin interna de las arterias cuando el corazn bombea sangre. El nmero ms bajo (presin diastlica) corresponde a la presin interna de las arterias cuando el corazn se relaja. En condiciones ideales, la presin arterial debe ser inferior a 120/80. La hipertensin fuerza al corazn a trabajar ms para bombear la sangre. Las arterias pueden estrecharse o ponerse rgidas. La hipertensin no tratada o no controlada puede causar infarto de miocardio, ictus, enfermedad renal y otros problemas. FACTORES DE RIESGO Algunos factores de riesgo de hipertensin son controlables, pero otros no lo son.  Entre los factores de riesgo que usted no puede controlar, se incluyen los siguientes:   La raza. El riesgo es mayor para las personas afroamericanas.  La edad. Los riesgos aumentan con la edad.  El sexo. Antes de los 45aos, los hombres corren ms riesgo que las mujeres. Despus de los 65aos, las mujeres corren ms riesgo que los hombres. Entre los factores de riesgo que usted puede controlar, se incluyen los siguientes:  No hacer la cantidad suficiente de actividad fsica o ejercicio.  Tener sobrepeso.  Consumir mucha grasa, azcar, caloras o sal en la dieta.  Beber alcohol en exceso. SIGNOS Y SNTOMAS Por lo general, la hipertensin no causa signos o sntomas. La hipertensin arterial demasiado alta (crisis hipertensiva) puede causar dolor de cabeza, ansiedad, falta de aire y hemorragia nasal. DIAGNSTICO Para detectar si usted tiene hipertensin, el  mdico le medir la presin arterial mientras est sentado, con el brazo levantado a la altura del corazn. Debe medirla al menos dos veces en el mismo brazo. Determinadas condiciones pueden causar una diferencia de presin arterial entre el brazo izquierdo y el derecho. El hecho de tener una sola lectura de la presin arterial ms alta que lo normal no significa que necesita un tratamiento. Si no est claro si tiene hipertensin arterial, es posible que se le pida que regrese otro da para volver a controlarle la presin arterial. O bien se le puede pedir que se controle la presin arterial en su casa durante 1 o ms meses. TRATAMIENTO El tratamiento de la hipertensin arterial incluye hacer cambios en el estilo de vida y, posiblemente, tomar medicamentos. Un estilo de vida saludable puede ayudar a bajar la presin arterial alta. Quiz deba cambiar algunos hbitos. Los cambios en el estilo de vida pueden incluir lo siguiente:  Seguir la dieta DASH. Esta dieta tiene un alto contenido de frutas, verduras y cereales integrales. Incluye poca cantidad de sal, carnes rojas y azcares agregados.  Mantenga el consumo de sodio por debajo de 2300 mg por da.  Realizar al menos entre 30 y 45 minutos de ejercicio aerbico, 4 veces por semana como mnimo.  Perder peso, si es necesario.  No fumar.  Limitar el consumo de bebidas alcohlicas.  Aprender formas de reducir el estrs. El mdico puede recetarle medicamentos si los cambios en el estilo de vida no son suficientes para lograr controlar la presin arterial y si una de las siguientes afirmaciones es verdadera:  Tiene entre 18 y 59 aos y su presin arterial sistlica est por encima de 140.  Tiene   60 aos o ms y su presin arterial sistlica est por encima de 150.  Su presin arterial diastlica est por encima de 90.  Tiene diabetes y su presin arterial sistlica est por encima de 140 o su presin arterial diastlica est por encima de  90.  Tiene una enfermedad renal y su presin arterial est por encima de 140/90.  Tiene una enfermedad cardaca y su presin arterial est por encima de 140/90. La presin arterial deseada puede variar en funcin de las enfermedades, la edad y otros factores personales. INSTRUCCIONES PARA EL CUIDADO EN EL HOGAR  Haga que le midan de nuevo la presin arterial segn las indicaciones del mdico.  Tome los medicamentos solamente como se lo haya indicado el mdico. Siga cuidadosamente las indicaciones. Los medicamentos para la presin arterial deben tomarse segn las indicaciones. Los medicamentos pierden eficacia al omitir las dosis. El hecho de omitir las dosis tambin aumenta el riesgo de otros problemas.  No fume.  Contrlese la presin arterial en su casa segn las indicaciones del mdico. SOLICITE ATENCIN MDICA SI:   Piensa que tiene una reaccin alrgica a los medicamentos.  Tiene mareos o dolores de cabeza con recurrencia.  Tiene hinchazn en los tobillos.  Tiene problemas de visin. SOLICITE ATENCIN MDICA DE INMEDIATO SI:  Siente un dolor de cabeza intenso o confusin.  Siente debilidad inusual, adormecimiento o que se desmayar.  Siente dolor intenso en el pecho o en el abdomen.  Vomita repetidas veces.  Tiene dificultad para respirar. ASEGRESE DE QUE:   Comprende estas instrucciones.  Controlar su afeccin.  Recibir ayuda de inmediato si no mejora o si empeora.   Esta informacin no tiene como fin reemplazar el consejo del mdico. Asegrese de hacerle al mdico cualquier pregunta que tenga.   Document Released: 02/07/2005 Document Revised: 06/24/2014 Elsevier Interactive Patient Education 2016 [employer]vier Inc.  

## 2015-07-11 NOTE — ED Provider Notes (Signed)
CSN: [id number]     Arrival date & time 07/11/15  0248 History   First MD Initiated Contact with Patient 07/11/15 [id number]     Chief Complaint  Patient presents with  . Hypertension     (Consider location/radiation/quality/duration/timing/severity/associated sxs/prior Treatment) HPI   Patient has a PMH of hypertension, diabetes and thyroid disease comes to the ER with complaints of high blood pressure. She reports a reading of 190 systolic at home which caused her to become very anxious. She took an extra prinzide around 9pm and decided to come to the ER when her blood pressure continued to be elevated. It is now 122/70 and she is no longer anxious but concerned about why her blood pressure became so high as this has never happened to her before. She denies having any increase in salt intake, changes in diet, new medication, stressful events. She reports no CP, SOB, LE swelling. She is asymptomatic.   Past Medical History  Diagnosis Date  . Hypertension   . Diabetes mellitus without complication (HCC)   . Thyroid disease    History reviewed. No pertinent past surgical history. No family history on file. Social History  Substance Use Topics  . Smoking status: Never Smoker   . Smokeless tobacco: None  . Alcohol Use: No   OB History    No data available     Review of Systems  Review of Systems All other systems negative except as documented in the HPI. All pertinent positives and negatives as reviewed in the HPI.   Allergies  Penicillins  Home Medications   Prior to Admission medications   Medication Sig Start Date End Date Taking? Authorizing Provider  butalbital-acetaminophen-caffeine (FIORICET) 50-325-40 MG tablet Take 1-2 tablets by mouth every 6 (six) hours as needed for headache. 01/15/15 01/15/16  Costin Otelia SergeantM Gherghe, MD  clindamycin (CLEOCIN) 300 MG capsule Take 1 capsule (300 mg total) by mouth every 8 (eight) hours. 01/15/15   Costin Otelia SergeantM Gherghe, MD  glimepiride (AMARYL) 2  MG tablet Take 2 mg by mouth daily with breakfast.    Historical Provider, MD  levothyroxine (SYNTHROID, LEVOTHROID) 112 MCG tablet Take 112 mcg by mouth daily before breakfast.    Historical Provider, MD  lisinopril-hydrochlorothiazide (PRINZIDE,ZESTORETIC) 20-12.5 MG per tablet Take 1 tablet by mouth daily.    Historical Provider, MD  metFORMIN (GLUCOPHAGE) 500 MG tablet Take by mouth 2 (two) times daily with a meal.    Historical Provider, MD   BP 122/70 mmHg  Pulse 84  Temp(Src) 98.5 F (36.9 C) (Oral)  Resp 19  Ht 4\' 10"  (1.473 m)  Wt 78.019 kg  BMI 35.96 kg/m2  SpO2 96% Physical Exam  Constitutional: She appears well-developed and well-nourished. No distress.  HENT:  Head: Normocephalic and atraumatic.  Right Ear: Tympanic membrane and ear canal normal.  Left Ear: Tympanic membrane and ear canal normal.  Nose: Nose normal.  Mouth/Throat: Uvula is midline, oropharynx is clear and moist and mucous membranes are normal.  Eyes: Pupils are equal, round, and reactive to light.  Neck: Normal range of motion. Neck supple.  Cardiovascular: Normal rate and regular rhythm.   Pulmonary/Chest: Effort normal.  Abdominal: Soft.  No signs of abdominal distention  Musculoskeletal:  No LE swelling  Neurological: She is alert.  Acting at baseline  Skin: Skin is warm and dry. No rash noted.  Nursing note and vitals reviewed.   ED Course  Procedures (including critical care time) Labs Review Labs Reviewed  CBC WITH DIFFERENTIAL/PLATELET -  Abnormal; Notable for the following:    WBC 12.0 (*)    Neutro Abs 9.2 (*)    All other components within normal limits  BASIC METABOLIC PANEL - Abnormal; Notable for the following:    Sodium 129 (*)    Chloride 97 (*)    CO2 20 (*)    Glucose, Bld 236 (*)    All other components within normal limits    Imaging Review No results found. I have personally reviewed and evaluated these images and lab results as part of my medical  decision-making.   EKG Interpretation None      MDM   Final diagnoses:  Secondary hypertension, unspecified  Hyperglycemia    Pt had transient asymptomatic hypertension which resolved after she took an extra Prinzide dose. Her CBC and BMP do now show any acute changes that require further work-up. She has not had headache, dizziness, syncope, chest pain, back pain, hematuria or any other associated symptoms.  Recommend patient follow-up with her primary care doctor and continue to monitor blood pressure closely. We discussed return precautions.  Filed Vitals:   07/11/15 0330 07/11/15 0345  BP: 129/73 122/70  Pulse: 88 84  Temp:    Resp: 20 [address], PA-C 07/11/15 0440  Zadie Rhine, MD 07/11/15 [phone]

## 2015-07-15 ENCOUNTER — Emergency Department (HOSPITAL_COMMUNITY): Payer: Self-pay

## 2015-07-15 ENCOUNTER — Encounter (HOSPITAL_COMMUNITY): Payer: Self-pay | Admitting: Adult Health

## 2015-07-15 ENCOUNTER — Emergency Department (HOSPITAL_COMMUNITY)
Admission: EM | Admit: 2015-07-15 | Discharge: 2015-07-15 | Disposition: A | Discharge status: Home / Self Care | Payer: Self-pay | Attending: Emergency Medicine | Admitting: Emergency Medicine

## 2015-07-15 DIAGNOSIS — Z7984 Long term (current) use of oral hypoglycemic drugs: Secondary | ICD-10-CM | POA: Insufficient documentation

## 2015-07-15 DIAGNOSIS — Z79899 Other long term (current) drug therapy: Secondary | ICD-10-CM | POA: Insufficient documentation

## 2015-07-15 DIAGNOSIS — M25512 Pain in left shoulder: Secondary | ICD-10-CM | POA: Insufficient documentation

## 2015-07-15 DIAGNOSIS — E119 Type 2 diabetes mellitus without complications: Secondary | ICD-10-CM | POA: Insufficient documentation

## 2015-07-15 DIAGNOSIS — E079 Disorder of thyroid, unspecified: Secondary | ICD-10-CM | POA: Insufficient documentation

## 2015-07-15 DIAGNOSIS — Z880 Allergy status to penicillin: Secondary | ICD-10-CM | POA: Insufficient documentation

## 2015-07-15 DIAGNOSIS — I10 Essential (primary) hypertension: Secondary | ICD-10-CM | POA: Insufficient documentation

## 2015-07-15 LAB — I-STAT TROPONIN, ED: TROPONIN I, POC: 0 ng/mL (ref 0.00–0.08)

## 2015-07-15 LAB — CBC
HCT: 36.8 % (ref 36.0–46.0)
HEMOGLOBIN: 12.9 g/dL (ref 12.0–15.0)
MCH: 29.1 pg (ref 26.0–34.0)
MCHC: 35.1 g/dL (ref 30.0–36.0)
MCV: 83.1 fL (ref 78.0–100.0)
PLATELETS: 248 10*3/uL (ref 150–400)
RBC: 4.43 MIL/uL (ref 3.87–5.11)
RDW: 12.3 % (ref 11.5–15.5)
WBC: 11.7 10*3/uL — AB (ref 4.0–10.5)

## 2015-07-15 LAB — BASIC METABOLIC PANEL
ANION GAP: 11 (ref 5–15)
BUN: 8 mg/dL (ref 6–20)
CALCIUM: 9.5 mg/dL (ref 8.9–10.3)
CO2: 21 mmol/L — AB (ref 22–32)
CREATININE: 0.66 mg/dL (ref 0.44–1.00)
Chloride: 94 mmol/L — ABNORMAL LOW (ref 101–111)
GFR calc non Af Amer: >60 mL/min (ref >60)
Glucose, Bld: 144 mg/dL — ABNORMAL HIGH (ref 65–99)
Potassium: 3.6 mmol/L (ref 3.5–5.1)
SODIUM: 126 mmol/L — AB (ref 135–145)

## 2015-07-15 LAB — MAGNESIUM: MAGNESIUM: 1.8 mg/dL (ref 1.7–2.4)

## 2015-07-15 MED ORDER — SODIUM CHLORIDE 0.9 % IV BOLUS (SEPSIS)
1000.0000 mL | Freq: Once | INTRAVENOUS | Status: AC
  Administered 2015-07-15: 1000 mL via INTRAVENOUS

## 2015-07-15 MED ORDER — KETOROLAC TROMETHAMINE 30 MG/ML IJ SOLN
30.0000 mg | Freq: Once | INTRAMUSCULAR | Status: AC
  Administered 2015-07-15: 30 mg via INTRAVENOUS
  Filled 2015-07-15: qty 1

## 2015-07-15 NOTE — ED Provider Notes (Signed)
History  By signing my name below, I, Erin Huff, attest that this documentation has been prepared under the direction and in the presence of [name], MD. Electronically Signed: Earmon PhoenixJennifer Huff, ED Scribe. 07/15/2015. 3:18 AM.  Chief Complaint  Patient presents with  . Arm Pain   The history is provided by the patient and medical records. No language interpreter was used.    HPI Comments:  Erin Huff is a 60 y.o. female who presents to the Emergency Department complaining of left shoulder pain that radiates down the arm that began yesterday afternoon. She states the pain began intermittently but is now constant. She has not taken anything for pain. She denies modifying factors. She was seen here three days ago for HTN. She denies CP, SOB, nausea, vomiting. She denies h/o MI or CVA. She denies any trauma, injury or fall. She denies any heavy lifting or strenuous activity.   Past Medical History  Diagnosis Date  . Hypertension   . Diabetes mellitus without complication (HCC)   . Thyroid disease    History reviewed. No pertinent past surgical history. History reviewed. No pertinent family history. Social History  Substance Use Topics  . Smoking status: Never Smoker   . Smokeless tobacco: None  . Alcohol Use: No   OB History    No data available     Review of Systems A complete 10 system review of systems was obtained and all systems are negative except as noted in the HPI and PMH.   Allergies  Penicillins  Home Medications   Prior to Admission medications   Medication Sig Start Date End Date Taking? Authorizing Provider  glimepiride (AMARYL) 2 MG tablet Take 2 mg by mouth daily with breakfast.   Yes Historical Provider, MD  levothyroxine (SYNTHROID, LEVOTHROID) 112 MCG tablet Take 112 mcg by mouth daily before breakfast.   Yes Historical Provider, MD  lisinopril-hydrochlorothiazide (PRINZIDE,ZESTORETIC) 20-12.5 MG per tablet Take 1 tablet by mouth  daily.   Yes Historical Provider, MD  metFORMIN (GLUCOPHAGE) 500 MG tablet Take by mouth 2 (two) times daily with a meal.   Yes Historical Provider, MD  butalbital-acetaminophen-caffeine (FIORICET) 50-325-40 MG tablet Take 1-2 tablets by mouth every 6 (six) hours as needed for headache. Patient not taking: Reported on 07/15/2015 01/15/15 01/15/16  [name], MD  clindamycin (CLEOCIN) 300 MG capsule Take 1 capsule (300 mg total) by mouth every 8 (eight) hours. Patient not taking: Reported on 07/15/2015 01/15/15   [name], MD   Triage Vitals: BP 138/90 mmHg  Pulse 88  Temp(Src) 98.1 F (36.7 C) (Oral)  Resp 18  SpO2 99% Physical Exam  Constitutional: She is oriented to person, place, and time. She appears well-developed and well-nourished. No distress.  HENT:  Head: Normocephalic and atraumatic.  Nose: Nose normal.  Mouth/Throat: Oropharynx is clear and moist. No oropharyngeal exudate.  Eyes: Conjunctivae and EOM are normal. Pupils are equal, round, and reactive to light. No scleral icterus.  Neck: Normal range of motion. Neck supple. No JVD present. No tracheal deviation present. No thyromegaly present.  Cardiovascular: Normal rate, regular rhythm and normal heart sounds.  Exam reveals no gallop and no friction rub.   No murmur heard. Normal pulses of LUE.  Pulmonary/Chest: Effort normal and breath sounds normal. No respiratory distress. She has no wheezes. She exhibits no tenderness.  Abdominal: Soft. Bowel sounds are normal. She exhibits no distension and no mass. There is no tenderness. There is no rebound and no guarding.  Musculoskeletal: Normal range of motion. She exhibits no edema or tenderness.  Left shoulder with normal ROM. No tenderness to palpation of the joint.  Lymphadenopathy:    She has no cervical adenopathy.  Neurological: She is alert and oriented to person, place, and time. No cranial nerve deficit. She exhibits normal muscle tone.  Distal sensations  of LUE intact.  Skin: Skin is warm and dry. No rash noted. No erythema. No pallor.  Nursing note and vitals reviewed.   ED Course  Procedures (including critical care time) DIAGNOSTIC STUDIES: Oxygen Saturation is 99% on RA, normal by my interpretation.   COORDINATION OF CARE: 3:15 AM- Will X-Ray left shoulder and order pain medication. Pt verbalizes understanding and agrees to plan.  Medications - No data to display  Labs Review Labs Reviewed  BASIC METABOLIC PANEL - Abnormal; Notable for the following:    Sodium 126 (*)    Chloride 94 (*)    CO2 21 (*)    Glucose, Bld 144 (*)    All other components within normal limits  CBC - Abnormal; Notable for the following:    WBC 11.7 (*)    All other components within normal limits  MAGNESIUM  I-STAT TROPOININ, ED    Imaging Review Dg Chest 2 View  07/15/2015  CLINICAL DATA:  Acute onset of back and left arm pain. Initial encounter. EXAM: CHEST  2 VIEW COMPARISON:  None. FINDINGS: The lungs are well-aerated. Minimal left basilar opacity likely reflects atelectasis. There is no evidence of pleural effusion or pneumothorax. The heart is normal in size; the mediastinal contour is within normal limits. No acute osseous abnormalities are seen. IMPRESSION: Minimal left basilar opacity likely reflects atelectasis. Lungs otherwise clear. Electronically Signed   By: [name] M.D.   On: 07/15/2015 01:34   Dg Shoulder Left  07/15/2015  CLINICAL DATA:  Left shoulder pain for 3 days.  No known injury. EXAM: LEFT SHOULDER - 2+ VIEW COMPARISON:  None. FINDINGS: There is no evidence of fracture or dislocation. There is no evidence of arthropathy or other focal bone abnormality. Soft tissues are unremarkable. Axillary and transscapular Y-view partial limited due to positioning. IMPRESSION: No acute bony abnormality of the left shoulder. Electronically Signed   By: Rubye Oaks M.D.   On: 07/15/2015 04:01   I have personally reviewed and  evaluated these images and lab results as part of my medical decision-making.   EKG Interpretation   Date/Time:  Wednesday Jul 15 2015 01:11:34 EDT Ventricular Rate:  93 PR Interval:  172 QRS Duration: 94 QT Interval:  602 QTC Calculation: 748 R Axis:   118 Text Interpretation:  Normal sinus rhythm Prolonged QT Abnormal ECG  Confirmed by Erroll Luna [phone]) on 07/15/2015 3:03:31 AM      MDM   Final diagnoses:  None    Patient presents to the emergency department for left shoulder pain. Triage initiated cardiac workup which was not indicated. This history is not consistent with ACS. Troponin and EKG are negative. Left shoulder x-ray negative. She was given Toradol for pain control. Advised to continue ibuprofen at home and see if primary care physician. She appears well in no acute distress, vital signs were within her normal limits and she is safe for discharge.   I personally performed the services described in this documentation, which was scribed in my presence. The recorded information has been reviewed and is accurate.       Tomasita Crumble, MD 07/15/15 [phone]

## 2015-07-15 NOTE — ED Notes (Signed)
Patient transported to X-ray 

## 2015-07-15 NOTE — Discharge Instructions (Signed)
Shoulder Pain Erin Huff, Your blood work and xrays are normal today.  Take ibuprofen as needed for pain and see a primary care doctor within 3 days.  If any symptoms worsen, come back to the ED immediately. Thank you.   Sra. Luis-Altamirano, Su trabajo con sangre y radiografas son normales hoy. Tome ibuprofen segn sea necesario para Chief Technology Officerel dolor y consulte a un mdico de atencin primaria dentro de 3 das. Si los sntomas empeoran, vuelva a la ED inmediatamente. Gracias.    Dolor en el hombro (Shoulder Pain)  El hombro es la articulacin que une los brazos al cuerpo. Los msculos y tejidos similares a cuerdas The Timken Companyunen los huesos a los msculos (tendones). El dolor en el hombro aparece como consecuencia de una lesin o una enfermedad que afecta una o ms partes del hombro. CUIDADOS EN EL HOGAR   Aplique hielo sobre la zona dolorida.  Ponga el hielo en una bolsa plstica.  Colquese una toalla entre la piel y la bolsa de hielo.  Deje el hielo en el lugar durante 15 a 20 minutos, 3 a 4 veces por Allstateda durante los [address] N[address]primeros das.  Deje de usar las compresas fras si no Editor, commissioningle calman el dolor.  Si le indican el uso de algn dispositivo para impedir que el hombro se mueva (cabestillo, inmovilizador del hombro), selo segn las indicaciones. Slo debe quitarlo para ducharse o baarse.  Mueva el brazo lo menos posible, pero mantenga la mano en movimiento para evitar la inflamacin (hinchazn).  Apriete una pelota blanda o una almohadilla de espuma de goma siempre que pueda, para evitar la hinchazn.  Tome los medicamentos como le indic el mdico. SOLICITE AYUDA SI:  Siente un dolor nuevo que se hace cada vez ms intenso en el brazo, la mano o los dedos.  La mano o los dedos estn fros.  Los medicamentos no Editor, commissioningle calman el dolor. SOLICITE AYUDA DE INMEDIATO SI:   El brazo, la mano o los dedos estn adormecidos o siente hormigueos.  El brazo, la mano o los dedos estn hinchados  (inflamados), le duelen o se ven blancos o azules. ASEGRESE DE QUE:   Comprende estas instrucciones.  Controlar su enfermedad.  Solicitar ayuda de inmediato si no mejora o si empeora.   Esta informacin no tiene Theme park managercomo fin reemplazar el consejo del mdico. Asegrese de hacerle al mdico cualquier pregunta que tenga.   Document Released: 05/06/2008 Document Revised: 02/28/2014 Elsevier Interactive Patient Education [employer]2016 Elsevier Inc.

## 2015-07-15 NOTE — ED Notes (Signed)
-  Spanish speaking-Presents with left arm, shoulder, back pain began yesterday-pain is constant, endorses anxiety and "feeling very nervous"  Pain is described as unexplainable and goes down left arm-nothing makes pain better, sitting up makes pain more comfortable. Endorses elevated BP at home as well.

## 2015-07-27 ENCOUNTER — Ambulatory Visit (INDEPENDENT_AMBULATORY_CARE_PROVIDER_SITE_OTHER): Payer: Self-pay | Admitting: Family Medicine

## 2015-07-27 ENCOUNTER — Ambulatory Visit (INDEPENDENT_AMBULATORY_CARE_PROVIDER_SITE_OTHER): Payer: Self-pay

## 2015-07-27 VITALS — BP 148/84 | HR 91 | Temp 98.5°F | Resp 16 | Ht <= 58 in | Wt 169.0 lb

## 2015-07-27 DIAGNOSIS — E1110 Type 2 diabetes mellitus with ketoacidosis without coma: Secondary | ICD-10-CM

## 2015-07-27 DIAGNOSIS — R1084 Generalized abdominal pain: Secondary | ICD-10-CM

## 2015-07-27 DIAGNOSIS — E1310 Other specified diabetes mellitus with ketoacidosis without coma: Secondary | ICD-10-CM

## 2015-07-27 DIAGNOSIS — M542 Cervicalgia: Secondary | ICD-10-CM

## 2015-07-27 DIAGNOSIS — I10 Essential (primary) hypertension: Secondary | ICD-10-CM

## 2015-07-27 DIAGNOSIS — F411 Generalized anxiety disorder: Secondary | ICD-10-CM

## 2015-07-27 DIAGNOSIS — E871 Hypo-osmolality and hyponatremia: Secondary | ICD-10-CM

## 2015-07-27 LAB — COMPREHENSIVE METABOLIC PANEL
ALK PHOS: 89 U/L (ref 33–130)
ALT: 23 U/L (ref 6–29)
AST: 21 U/L (ref 10–35)
Albumin: 4.8 g/dL (ref 3.6–5.1)
BILIRUBIN TOTAL: 0.6 mg/dL (ref 0.2–1.2)
BUN: 7 mg/dL (ref 7–25)
CO2: 23 mmol/L (ref 20–31)
CREATININE: 0.63 mg/dL (ref 0.50–1.05)
Calcium: 9.6 mg/dL (ref 8.6–10.4)
Chloride: 90 mmol/L — ABNORMAL LOW (ref 98–110)
GLUCOSE: 182 mg/dL — AB (ref 65–99)
POTASSIUM: 3.9 mmol/L (ref 3.5–5.3)
SODIUM: 126 mmol/L — AB (ref 135–146)
TOTAL PROTEIN: 7.7 g/dL (ref 6.1–8.1)

## 2015-07-27 LAB — POCT CBC
Granulocyte percent: 79.2 %G (ref 37–80)
HCT, POC: 37.2 % — AB (ref 37.7–47.9)
Hemoglobin: 13.6 g/dL (ref 12.2–16.2)
Lymph, poc: 1.4 (ref 0.6–3.4)
MCH: 31.3 pg — AB (ref 27–31.2)
MCHC: 36.6 g/dL — AB (ref 31.8–35.4)
MCV: 85.7 fL (ref 80–97)
MID (CBC): 0.5 (ref 0–0.9)
MPV: 6.7 fL (ref 0–99.8)
PLATELET COUNT, POC: 239 10*3/uL (ref 142–424)
POC Granulocyte: 7.2 — AB (ref 2–6.9)
POC LYMPH %: 14.9 % (ref 10–50)
POC MID %: 5.9 % (ref 0–12)
RBC: 4.34 M/uL (ref 4.04–5.48)
RDW, POC: 12.7 %
WBC: 9.1 10*3/uL (ref 4.6–10.2)

## 2015-07-27 LAB — POCT URINALYSIS DIP (MANUAL ENTRY)
Bilirubin, UA: NEGATIVE
Blood, UA: NEGATIVE
Glucose, UA: NEGATIVE
LEUKOCYTES UA: NEGATIVE
NITRITE UA: NEGATIVE
PH UA: 7
PROTEIN UA: NEGATIVE
Spec Grav, UA: 1.015
UROBILINOGEN UA: 0.2

## 2015-07-27 LAB — POCT GLYCOSYLATED HEMOGLOBIN (HGB A1C): HEMOGLOBIN A1C: 7.8

## 2015-07-27 LAB — GLUCOSE, POCT (MANUAL RESULT ENTRY): POC Glucose: 187 mg/dl — AB (ref 70–99)

## 2015-07-27 MED ORDER — METOPROLOL SUCCINATE ER 25 MG PO TB24
25.0000 mg | ORAL_TABLET | Freq: Every day | ORAL | Status: DC
Start: 2015-07-27 — End: 2016-03-01

## 2015-07-27 MED ORDER — LISINOPRIL 20 MG PO TABS: 20.0000 mg | ORAL_TABLET | Freq: Every day | ORAL | Status: DC

## 2015-07-27 MED ORDER — HYDROXYZINE HCL 25 MG PO TABS: 12.5000 mg | ORAL_TABLET | Freq: Three times a day (TID) | ORAL | Status: DC | PRN

## 2015-07-27 NOTE — Progress Notes (Signed)
Subjective:    Patient ID: Erin Huff, female    DOB: [birth year], 60 y.o.   MRN: [id number]  07/27/2015  Hypertension   HPI This 60 y.o. female presents for evaluation of high blood pressure and pulse.  Blood pressure and heart rate elevated; heart rate has elevated to 100; blood pressure also running high.  With elevated blood pressure, gets LUQ abdominal pain and L arm pian.  Has undergone ED evaluation twice in the past two weeks.  PCP states that elevated blood pressure and pulse is due to the thyroid pathology.  Sugars running 200 this morning.  Usually runs 140.  +HA forehead.  +dizziness.  No SOB.   S/p CXR and L shoulder xray in ED and both negative.  PCP:  [facility] [address] [place] [phone]  Last visit with PCP   Sodium level 126 on 07/15/15.  Ten days ago, started taking blood pressure medication.    Just staarted Levothyroxine daily within ten days ago; on 06/20/15.  Ran out of Synthroid for a few days.  Aslo taking Ambien  qhs.    Lisinopril/HCTZ 20/12.5 one daily chronic rx; no recent change in medication.   Glimepiride  bid. Levothyroxine  daily. Fluoxetine  daily started on 07/15/15. Metformin  one bid.     Review of Systems  Constitutional: Negative for fever, chills, diaphoresis and fatigue.  Eyes: Negative for visual disturbance.  Respiratory: Negative for cough and shortness of breath.   Cardiovascular: Negative for chest pain, palpitations and leg swelling.  Gastrointestinal: Negative for nausea, vomiting, abdominal pain, diarrhea and constipation.  Endocrine: Negative for cold intolerance, heat intolerance, polydipsia, polyphagia and polyuria.  Neurological: Negative for dizziness, tremors, seizures, syncope, facial asymmetry, speech difficulty, weakness, light-headedness, numbness and headaches.    Past Medical History  Diagnosis Date  . Hypertension   . Diabetes mellitus without complication  (HCC)   . Thyroid disease    History reviewed. No pertinent past surgical history. Allergies  Allergen Reactions  . Penicillins Rash    Social History   Social History  . Marital Status: Married    Spouse Name: N/A  . Number of Children: N/A  . Years of Education: N/A   Occupational History  . Not on file.   Social History Main Topics  . Smoking status: Never Smoker   . Smokeless tobacco: Not on file  . Alcohol Use: No  . Drug Use: No  . Sexual Activity: Not on file   Other Topics Concern  . Not on file   Social History Narrative   History reviewed. No pertinent family history.     Objective:    BP 148/84 mmHg  Pulse 91  Temp(Src) 98.5 F (36.9 C) (Oral)  Resp 16  Ht  (1.473 m)  Wt 169 lb (76.658 kg)  BMI 35.33 kg/m2  SpO2 98% Physical Exam  Constitutional: She is oriented to person, place, and time. She appears well-developed and well-nourished. No distress.  HENT:  Head: Normocephalic and atraumatic.  Right Ear: External ear normal.  Left Ear: External ear normal.  Nose: Nose normal.  Mouth/Throat: Oropharynx is clear and moist.  Eyes: Conjunctivae and EOM are normal. Pupils are equal, round, and reactive to light.  Neck: Normal range of motion. Neck supple. Carotid bruit is not present. No thyromegaly present.  Cardiovascular: Normal rate, regular rhythm, normal heart sounds and intact distal pulses.  Exam reveals no gallop and no friction rub.   No murmur heard. Pulmonary/Chest:  Effort normal and breath sounds normal. She has no wheezes. She has no rales.  Abdominal: Soft. Bowel sounds are normal. She exhibits no distension and no mass. There is no tenderness. There is no rebound and no guarding.  Lymphadenopathy:    She has no cervical adenopathy.  Neurological: She is alert and oriented to person, place, and time. No cranial nerve deficit.  Skin: Skin is warm and dry. No rash noted. She is not diaphoretic. No erythema. No pallor.    Psychiatric: She has a normal mood and affect. Her behavior is normal.   Results for orders placed or performed in visit on 07/27/15  POCT CBC  Result Value Ref Range   WBC 9.1 4.6 - 10.2 K/uL   Lymph, poc 1.4 0.6 - 3.4   POC LYMPH PERCENT 14.9 10 - 50 %L   MID (cbc) 0.5 0 - 0.9   POC MID % 5.9 0 - 12 %M   POC Granulocyte 7.2 (A) 2 - 6.9   Granulocyte percent 79.2 37 - 80 %G   RBC 4.34 4.04 - 5.48 M/uL   Hemoglobin 13.6 12.2 - 16.2 g/dL   HCT, POC 16.137.2 (A) 09.637.7 - 47.9 %   MCV 85.7 80 - 97 fL   MCH, POC 31.3 (A) 27 - 31.2 pg   MCHC 36.6 (A) 31.8 - 35.4 g/dL   RDW, POC 04.512.7 %   Platelet Count, POC 239 142 - 424 K/uL   MPV 6.7 0 - 99.8 fL  POCT glucose (manual entry)  Result Value Ref Range   POC Glucose 187 (A) 70 - 99 mg/dl  POCT glycosylated hemoglobin (Hb A1C)  Result Value Ref Range   Hemoglobin A1C 7.8   POCT urinalysis dipstick  Result Value Ref Range   Color, UA yellow yellow   Clarity, UA clear clear   Glucose, UA negative negative   Bilirubin, UA negative negative   Ketones, POC UA trace (5) (A) negative   Spec Grav, UA 1.015    Blood, UA negative negative   pH, UA 7.0    Protein Ur, POC negative negative   Urobilinogen, UA 0.2    Nitrite, UA Negative Negative   Leukocytes, UA Negative Negative   Dg Cervical Spine 2 Or 3 Views  07/27/2015  CLINICAL DATA:  Left-sided neck pain. No known injury. Initial encounter. EXAM: CERVICAL SPINE - 2-3 VIEW COMPARISON:  None. FINDINGS: Vertebral body height and alignment are maintained. Intervertebral disc space height is normal. The facet joints are unremarkable. Prevertebral soft tissues appear normal. Lung apices are clear. IMPRESSION: Negative exam. Electronically Signed   By: Drusilla Kannerhomas  Dalessio M.D.   On: 07/27/2015 12:56   Dg Abd Acute W/chest  07/27/2015  CLINICAL DATA:  Left upper quadrant pain, epigastric pain, generalized abdominal pain. EXAM: DG ABDOMEN ACUTE W/ 1V CHEST COMPARISON:  Chest x-ray 07/15/2015 FINDINGS:  Moderate stool burden throughout the colon. The bowel gas pattern is normal. There is no evidence of free intraperitoneal air. No suspicious radio-opaque calculi or other significant radiographic abnormality is seen. Heart size and mediastinal contours are within normal limits. Both lungs are clear. IMPRESSION: No acute findings.  Moderate stool burden. No acute cardiopulmonary disease. Electronically Signed   By: Charlett NoseKevin  Dover M.D.   On: 07/27/2015 12:54      Assessment & Plan:   1. Essential hypertension   2. Anxiety state   3. Generalized abdominal pain   4. Hyponatremia   5. Uncontrolled type 2 diabetes mellitus with ketoacidosis  without coma, without long-term current use of insulin (HCC)   6. Neck pain     Orders Placed This Encounter  Procedures  . DG Abd Acute W/Chest    Standing Status: Future     Number of Occurrences: 1     Standing Expiration Date: 07/26/2016    Order Specific Question:  Reason for exam:    Answer:  LUQ pain, epigastric pain, generalized abdominal pain    Order Specific Question:  Is the patient pregnant?    Answer:  No    Order Specific Question:  Preferred imaging location?    Answer:  External  . DG Cervical Spine 2 or 3 views    Standing Status: Future     Number of Occurrences: 1     Standing Expiration Date: 07/26/2016    Order Specific Question:  Reason for Exam (SYMPTOM  OR DIAGNOSIS REQUIRED)    Answer:  L sided neck pain    Order Specific Question:  Is the patient pregnant?    Answer:  No    Order Specific Question:  Preferred imaging location?    Answer:  External  . Comprehensive metabolic panel  . POCT CBC  . POCT glucose (manual entry)  . POCT glycosylated hemoglobin (Hb A1C)  . POCT urinalysis dipstick   Meds ordered this encounter  Medications  . zolpidem (AMBIEN) 10 MG tablet    Sig: Take 10 mg by mouth at bedtime as needed for sleep.  Marland Kitchen FLUoxetine (PROZAC) 20 MG tablet    Sig: Take 20 mg by mouth daily.  Marland Kitchen lisinopril  (PRINIVIL,ZESTRIL) 20 MG tablet    Sig: Take 1 tablet (20 mg total) by mouth daily.    Dispense:  30 tablet    Refill:  3  . metoprolol succinate (TOPROL-XL) 25 MG 24 hr tablet    Sig: Take 1 tablet (25 mg total) by mouth daily. Take with or immediately following a meal.    Dispense:  30 tablet    Refill:  3  . hydrOXYzine (ATARAX/VISTARIL) 25 MG tablet    Sig: Take 0.5-1 tablets (12.5-25 mg total) by mouth every 8 (eight) hours as needed for anxiety.    Dispense:  30 tablet    Refill:  0    No Follow-up on file.   Yuvia Plant Paulita Fujita, M.D. [facility] [address] [place], [place]  [address] [phone] phone [phone] fax

## 2015-07-27 NOTE — Patient Instructions (Addendum)
1.  STOP Lisinopril/HCTZ 20/12.5 due to persistently low sodium level. 2.  START Lisinopril 20mg  one tablet daily for high blood pressure. 3. START Metoprolol ER 25mg  one tablet daily for high blood pressure and high pulse/heart rate. 4.  START hydroxyzine 25mg   1/2-1 tablet three times daily as needed for nervousness. 5. Continue Fluoxetine, Metformin, Levothyroxine, Glimepiride, Zolpidem as prescribed.   6. Follow-up with primary care physician next week as scheduled.     IF you received an x-ray today, you will receive an invoice from [facility]. Please contact [facility] at [phone] with questions or concerns regarding your invoice.   IF you received labwork today, you will receive an invoice from [facility]. Please contact [facility] at [phone] with questions or concerns regarding your invoice.   Our billing staff will not be able to assist you with questions regarding bills from these companies.  You will be contacted with the lab results as soon as they are available. The fastest way to get your results is to activate your My Chart account. Instructions are located on the last page of this paperwork. If you have not heard from us regarding the results in 2 weeks, please contact this office.     Estreimiento - Adultos (Constipation, Adult) Estreimiento significa que una persona tiene menos de tres evacuaciones en una semana, dificultad para defecar, o que las heces son secas, duras, o ms grandes que lo normal. A medida que envejecemos el estreimiento es ms comn. Una dieta baja en fibra, no tomar suficientes lquidos y el uso de ciertos medicamentos pueden Scientist, research (life sciences)empeorar el estreimiento.  CAUSAS   Ciertos medicamentos, como los antidepresivos, analgsicos, suplementos de hierro, anticidos y diurticos.  Algunas enfermedades, como la diabetes, el sndrome del colon irritable, enfermedad de la tiroides, o depresin.  No beber  suficiente agua.  No consumir suficientes alimentos ricos en fibra.  Situaciones de estrs o viajes.  Falta de actividad fsica o de ejercicio.  Ignorar la necesidad sbita de Advertising copywriterdefecar.  Uso en exceso de laxantes. SIGNOS Y SNTOMAS   Defecar menos de tres veces por semana.  Dificultad para defecar.  Tener las heces secas y duras, o ms grandes que las normales.  Sensacin de estar lleno o hinchado.  Dolor en la parte baja del abdomen.  No sentir alivio despus de defecar. DIAGNSTICO  El mdico le har una historia clnica y un examen fsico. Pueden hacerle exmenes adicionales para el estreimiento grave. Estos estudios pueden ser:  Un radiografa con enema de bario para examinar el recto, el colon y, en algunos casos, el intestino delgado.  Una sigmoidoscopia para examinar el colon inferior.  Una colonoscopia para examinar todo el colon. TRATAMIENTO  El tratamiento depender de la gravedad del estreimiento y de la causa. Algunos tratamientos nutricionales son beber ms lquidos y comer ms alimentos ricos en fibra. El cambio en el estilo de vida incluye hacer ejercicios de Indian Villagemanera regular. Si estas recomendaciones para Public relations account executiverealizar cambios en la dieta y en el estilo de vida no ayudan, el mdico le puede indicar el uso de laxantes de venta libre para ayudarlo a Advertising copywriterdefecar. Los medicamentos recetados se pueden prescribir si los medicamentos de venta libre no lo [place].  INSTRUCCIONES PARA EL CUIDADO EN EL HOGAR   Consuma alimentos con alto contenido de [place], como frutas, vegetales, cereales integrales y porotos.  Limite los alimentos procesados ricos en grasas y azcar, como las papas fritas, hamburguesas, galletas, dulces y refrescos.  Puede agregar un suplemento de  fibra a su dieta si no obtiene lo suficiente de los alimentos.  Beba suficiente lquido para Photographer orina clara o de color amarillo plido.  Haga ejercicio regularmente o segn las indicaciones del  mdico.  Vaya al bao cuando sienta la necesidad de ir. No se aguante las ganas.  Tome solo medicamentos de venta libre o recetados, segn las indicaciones del mdico. No tome otros medicamentos para el estreimiento sin consultarlo antes con su mdico. SOLICITE ATENCIN MDICA DE INMEDIATO SI:   Observa sangre brillante en las heces.  El estreimiento dura ms de 4 das o [place].  Siente dolor abdominal o rectal.  Las heces son delgadas como un lpiz.  Pierde peso de [place] inexplicable. ASEGRESE DE QUE:   Comprende estas instrucciones.  Controlar su afeccin.  Recibir ayuda de inmediato si no mejora o si empeora.   Esta informacin no tiene Theme park manager el consejo del mdico. Asegrese de hacerle al mdico cualquier pregunta que tenga.   Document Released: 02/27/2007 Document Revised: 02/28/2014 Elsevier Interactive Patient Education 2016 [employer]. Distensin y esguince cervical con rehabilitacin (Cervical Strain and Sprain With Rehab) La distensin y el esguince cervical suelen deberse a lesiones provocadas por movimientos de Social research officer, government cervical. El latigazo cervical es un movimiento de flexin del cuello hacia atrs o adelante que es brusco y Nutritional therapist, por ejemplo, durante un accidente automovilstico o mientras se practican deportes de contacto. Los msculos, los ligamentos, los tendones, los discos y los nervios del cuello son propensos a lesionarse cuando esto ocurre. FACTORES DE RIESGO El riesgo de sufrir un esguince cervical aumenta con lo siguiente:  Artrosis de columna.  Situaciones que aumentan la probabilidad de sufrir accidentes o traumatismos de la cabeza o el cuello.  Deportes de Conservator, museum/gallery (ftbol americano, rugby, hockey, automovilismo, gimnasia, buceo, karate de contacto o boxeo).  Poca fuerza y flexibilidad en el cuello.  Lesin previa en el cuello.  Mala tcnica de placaje.  Equipo de calce inadecuado o que no est bien  acolchado. SNTOMAS   Dolor o rigidez en la parte delantera o posterior del cuello, o en ambas.  Los sntomas pueden aparecer de inmediato o en el trmino de 24horas despus de la lesin.  Mareos, dolor de [place], nuseas y vmitos.  Espasmo muscular con dolor y rigidez en el cuello.  Dolor a la palpacin e Paramedic de la lesin. PREVENCIN  Aprenda y use las tcnicas adecuadas (no plaque ni embista con la cabeza, ni d topetazos; use las tcnicas correctas para caer a fin de evitar caerse de cabeza).  Haga los ejercicios de precalentamiento y elongacin correctos antes de la [place].  Mantngase en buen estado fsico:  Earma [place], flexibilidad y resistencia.  Buen estado cardiovascular.  Use equipo de proteccin que calce correctamente y est bien acolchado, por ejemplo, collarines blandos acolchados, cuando practique deportes de contacto. PRONSTICO  La recuperacin de las lesiones por distensin y esguince cervical depende de la magnitud de la lesin. Generalmente, la lesin se cura en el trmino de 1semana a con el tratamiento adecuado.  COMPLICACIONES RELACIONADAS   Pueden presentarse adormecimiento y debilidad temporarios si las races nerviosas estn daadas, que pueden continuar hasta que el nervio est completamente curado.  Dolor crnico debido a la recurrencia frecuente de los sntomas.  Recuperacin prolongada, especialmente si se reanuda la [employer] pronto (antes de la recuperacin total). TRATAMIENTO  Inicialmente, el tratamiento incluye el uso de hielo y medicamentos para ayudar a Engineer, materials y  la inflamacin. Tambin es Teacher, early years/pre ejercicios de fortalecimiento y Therapist, nutritional, y Biomedical engineer las actividades que intensifican los sntomas para que la lesin no empeore. Estos ejercicios pueden realizarse en la casa o con un terapeuta. A los pacientes que tienen sntomas intensos, tal vez se les recomiende el uso de un collarn blando  acolchado alrededor del cuello.  Mejorar la postura puede ayudar a [employer] sntomas. La mejora de la postura incluye hundir el abdomen y el mentn mientras est de pie o sentado. Si se sienta, hgalo en una silla firme con los glteos apoyados contra el respaldo. Mientras duerme, intente reemplazar la almohada por una toalla pequea enrollada de 2pulgadas (5centmetros) de dimetro, o use una almohada cervical o un collarn cervical blando. Las [employer] posiciones al dormir Airline pilot.  Para los pacientes que tienen dao de las races nerviosas que les causa adormecimiento o debilidad, puede ser recomendable un aparato de traccin cervical. En contadas ocasiones, se debe realizar una ciruga para tratar estas lesiones. Sin embargo, es posible que la distensin y los esguinces cervicales que estn presentes al nacer (congnitos) requieran [place]. MEDICAMENTOS   Si se necesitan analgsicos, a menudo se recomiendan los antiinflamatorios no esteroides, como la aspirina y el ibuprofeno, u otros analgsicos suaves, como el paracetamol.  No tome analgsicos durante 7das antes de la [place].  Se pueden administrar analgsicos recetados si el mdico lo considera necesario. Utilcelos como se le indique y solo cuando lo necesite. CALOR Y FRO:   El tratamiento confro (aplicacin de hielo) Printmaker dolor y reduce la inflamacin. Este se debe aplicar durante 10 a cada 2 o 3horas para la inflamacin y Chief Technology Officer, e inmediatamente despus de Education officer, environmental cualquier actividad que intensifique los sntomas. Use bolsas de hielo o un masaje con hielo.  Se puede usar Engineer, water antes de Education officer, environmental las actividades de elongacin y fortalecimiento indicadas por el mdico, el fisioterapeuta o Orthoptist. Pngase una compresa caliente o dese un bao tibio de inmersin. SOLICITE ATENCIN MDICA SI:   Los sntomas empeoran o no mejoran en 2semanas, a pesar de Paramedic.  Presenta sntomas nuevos sin motivo aparente (los medicamentos utilizados durante el tratamiento pueden producir [place]). EJERCICIOS EJERCICIOS DE AMPLITUD DE MOVIMIENTOS Y DE ELONGACIN: distensin y esguince cervical Estos ejercicios pueden ser de ayuda al comenzar la rehabilitacin de la lesin. Para que los sntomas se resuelvan satisfactoriamente, debe mejorar la postura. Estos ejercicios estn diseados para ayudar a Armed forces technical officer de la cabeza hacia adelante y protraccin de los hombros, la cual contribuye a Copy. Los sntomas pueden resolverse con o sin mayor intervencin del mdico, el fisioterapeuta o Orthoptist. Mientras realiza estos ejercicios, recuerde lo siguiente:   Al restablecer la flexibilidad de los tejidos, las articulaciones recuperan el movimiento normal, lo que permite movimientos y actividades ms dinmicos y con Psychologist, educational.  La elongacin eficaz se debe mantener durante por lo menos 20segundos, aunque tal vez deba comenzar con sesiones de [place] para su comodidad.  La elongacin nunca debe ser dolorosa. Solo debe sentir un estiramiento o aflojamiento suave en tejido en elongacin. ELONGACIN: extensores axiales  Acustese en el piso boca arriba. Puede flexionar las rodillas para estar cmodo. Coloque un toalla de mano o un repasador enrollado, de unas 2pulgadas (5centmetros) de dimetro, debajo de la zona de la cabeza que est apoyada sobre el piso.  Suavemente hunda el [place], como si intentara formar una papada, [place] sentir una leve elongacin  en la base de la cabeza.  Mantenga la posicin durante __________segundos. Repita __________veces. Haga este ejercicio __________veces por da.  ELONGACIN: extensin axial  Prese o sintese sobre una superficie firme. Adopte una postura correcta: el pecho erguido, los hombros [employer], los msculos abdominales apenas tensos, las rodillas sin trabar (si est de pie) y  los pies separados al ancho las caderas.  Con un movimiento lento, lleve el [employer], de modo que la cabeza se deslice hacia atrs y el mentn baje levemente. Siga mirando hacia adelante.  Debe sentir una elongacin [place] parte posterior de la cabeza. Tenga presente que la elongacin no tiene que ser brusca ya que esto puede causar dolores de cabeza ms tarde.  Mantenga la posicin durante __________segundos. Repita __________veces. Haga este ejercicio __________veces por da. ELONGACIN: flexin cervical lateral   Prese o sintese sobre una superficie firme. Adopte una postura correcta: el pecho erguido, los hombros [employer], los msculos abdominales apenas tensos, las rodillas sin trabar (si est de pie) y los pies separados al ancho las caderas.  Sin mover la nariz ni los hombros, lentamente deje caer la oreja derecha / izquierdo hacia el hombro hasta sentir la elongacin suave de los msculos del lado contrario del cuello.  Mantenga la posicin durante __________segundos. Repita __________veces. Haga este ejercicio __________veces por da. ELONGACIN: rotadores cervicales   Prese o sintese sobre una superficie firme. Adopte una postura correcta: el pecho erguido, los hombros [employer], los msculos abdominales apenas tensos, las rodillas sin trabar (si est de pie) y los pies separados al ancho las caderas.  Con los ojos nivelados con el piso, gire lentamente la cabeza hasta sentir una elongacin [place] a lo largo de la espalda y el lado opuesto del cuello.  Mantenga la posicin durante __________segundos. Repita __________veces. Haga este ejercicio __________veces por da. AMPLITUD DE MOVIMIENTOS: crculos con el cuello   Prese o sintese sobre una superficie firme. Adopte una postura correcta: el pecho erguido, los hombros [employer], los msculos abdominales apenas tensos, las rodillas sin trabar (si est de pie) y los pies separados al ancho las  caderas.  Suavemente baje la cabeza y haga movimientos circulares desde la parte posterior de un hombro hasta la parte posterior del [place]. El movimiento nunca debe ser forzado ni doloroso.  Repita el movimiento 10 o 20veces, o hasta que sienta que los msculos del cuello se Banker y se aflojan. Repita __________veces. Haga el ejercicio __________veces por da. EJERCICIOS DE FORTALECIMIENTO: distensin y esguince cervical Estos ejercicios pueden ser de ayuda al comenzar la rehabilitacin de la lesin. Estos pueden resolver los sntomas con o sin mayor intervencin del mdico, el fisioterapeuta o Orthoptist. Mientras realiza estos ejercicios, recuerde lo siguiente:   Los msculos pueden adquirir la resistencia y la fuerza necesarias para las actividades cotidianas a travs de ejercicios controlados.  Realice estos ejercicios como se lo hayan indicado el mdico, el fisioterapeuta o Orthoptist. Aumente la resistencia y las repeticiones solo como se lo hayan indicado.  Puede tener dolor o fatiga muscular; sin embargo, Chief Technology Officer o las molestias que intenta eliminar nunca deben intensificarse durante la realizacin de estos ejercicios. Si el dolor se intensifica, detngase y asegrese de estar siguiendo las indicaciones de Advice worker. Si an siente dolor despus de los ajustes, deje de hacer el ejercicio hasta tanto pueda analizar el problema con el mdico. FUERZA: flexores cervicales, isomtrico   Prese de frente a una pared a una distancia aproximada de  6pulgadas (15centmetros). Coloque una almohada pequea, una pelota de unas 6 a 8pulgadas (15 a 20centmetros) de dimetro o una toalla doblada entre la frente y la pared.  Hunda levemente el mentn y, con [place], empuje el objeto blando con la frente. La intensidad del empuje debe ser leve a moderada, y la tensin debe aumentarse de manera gradual. Mantenga relajadas la mandbula y la frente.  Mantenga la posicin durante 10 a  20segundos. Respire tranquilo.  Disminuya lentamente la tensin. Relaje los msculos del cuello por completo antes de comenzar la siguiente repeticin. Repita __________veces. Haga este ejercicio __________veces por da. FUERZA: flexores cervicales laterales, isomtrico   Prese a una distancia aproximada de 6pulgadas (15centmetros) de una pared. Coloque una almohada pequea, una pelota de unas 6 a 8pulgadas (15 a 20centmetros) de Probation officer o una toalla doblada entre el costado de la cabeza y la pared.  Hunda levemente el mentn y, con [place], empuje el objeto blando con la [place]. La intensidad del empuje debe ser leve a moderada, y la tensin debe aumentarse de manera gradual. Mantenga relajadas la mandbula y la frente.  Mantenga la posicin durante 10 a 20segundos. Respire tranquilo.  Disminuya lentamente la tensin. Relaje los msculos del cuello por completo antes de comenzar la siguiente repeticin. Repita __________veces. Haga este ejercicio __________veces por da. FUERZA: extensores cervicales, isomtrico  Prese a una distancia aproximada de 6pulgadas (15centmetros) de una pared. Coloque una almohada pequea, una pelota de unas 6 a 8pulgadas (15 a 20centmetros) de dimetro o una toalla doblada entre la zona posterior de la cabeza y la pared.  Hunda levemente el mentn y, con [place], empuje el objeto blando con la parte posterior de la cabeza. La intensidad del empuje debe ser leve a moderada, y la tensin debe aumentarse de manera gradual. Mantenga relajadas la mandbula y la frente.  Mantenga la posicin durante 10 a 20segundos. Respire tranquilo.  Disminuya lentamente la tensin. Relaje los msculos del cuello por completo antes de comenzar la siguiente repeticin. Repita __________veces. Haga este ejercicio __________veces por da. CONSIDERACIONES ACERCA DE LA POSTURA Y LA MECNICA DEL CUERPO: distensin y esguince cervical Mantener una postura correcta  mientras est sentado, de pie o realizando sus actividades reducir la tensin en los diferentes tejidos del cuerpo, lo que permitir la recuperacin de los tejidos lesionados y la disminucin de las experiencias que Teaching laboratory technician. A continuacin se incluyen pautas generales para mejorar la postura. El mdico o el fisioterapeuta le darn indicaciones especficas para sus necesidades. Mientras lea estas pautas, recuerde lo siguiente:  Los ejercicios que el mdico le indique lo ayudarn a Public librarian flexibilidad y la fuerza para Pharmacologist las posturas correctas.  La postura correcta ofrece a las articulaciones el entorno ptimo para su funcionamiento. Todas las articulaciones sufren un desgaste menor cuando la columna est en la postura correcta y brinda un sostn adecuado. Esto significa un cuerpo ms sano y con [employer].  En todas las actividades, la postura debe ser la correcta, especialmente cuando est sentado o de pie. La postura correcta es igual de importante cuando realiza actividades repetitivas con bajo nivel de tensin (tipear) y cuando lleva a cabo una nica actividad con cargas pesadas (levantar objetos). DE PIE DURANTE MUCHO TIEMPO E INCLINADO LEVEMENTE HACIA ADELANTE  Cuando realice una tarea que le exija inclinarse hacia adelante mientras est de pie en un lugar durante mucho tiempo, apoye un pie sobre un objeto inmvil que tenga una altura de 2 a 4pulgadas (5 a 10centmetros), para Pharmacologist  la mejor postura. Cuando ambos pies estn apoyados en el piso, la parte baja de la espalda tiende a perder la curvatura leve que tiene [place]. Si esta curva se aplana (o se vuelve muy pronunciada), aumentar mucho la tensin sobre la espalda y las dems articulaciones, se fatigarn con mayor rapidez y tal vez le causen Engineer, mining.  POSICIONES DE DESCANSO Tenga en cuentas las posiciones que ms dolor le causan cuando elija una de descanso. Si las [employer] exigen flexionarse (sentarse,  agacharse, encorvarse, [employer] en cuclillas) le Teaching laboratory technician, opte por una posicin que le permita descansar en una postura menos flexionada. No se curve en posicin fetal de costado. Si el dolor se intensifica con las [employer]PER[employer] INC exigen extenderse (estar de pie durante mucho tiempo, trabajar con las manos por encima de la cabeza), no descanse en una posicin extendida, por ejemplo, dormir boca abajo. La mayora de las personas estarn ms cmodas cuando descansen con la columna vertebral en una posicin ms neutral, ni muy curvada ni muy arqueada. Con frecuencia, se sentir ms aliviado si se acuesta de costado en una cama que no se hunda con una [employer], o boca arriba con una almohada debajo de las rodillas. Recuerde Chief Technology Officer en una sola posicin durante [place], sin importar si la postura es [place], puede causar rigidez. CAMINAR [place] erguido. Las [place], los hombros y las caderas deben estar alineados. TRABAJO DE OFICINA Si trabaja en un escritorio, cree un entorno que le permita mantener una buena postura erguida. Sin soporte adicional, los msculos se fatigan y causan una tensin excesiva en las articulaciones y otros tejidos. SILLA:  La silla debe poder deslizarse por debajo del escritorio cuando apoye la espalda en el respaldo. Esto le permite trabajar ms cerca.  La altura de la silla debe permitirle que los ojos estn nivelados con la parte superior del monitor y las manos estn apenas ms abajo que los codos.  Posicin del cuerpo:  Debe tener los pies apoyados en el piso. Si no es posible, use un posapies.  Mantenga las orejas por encima de los hombros. Esto reducir la tensin en el cuello y la cintura.   Esta informacin no tiene Theme park manager el consejo del mdico. Asegrese de hacerle al mdico cualquier pregunta que tenga.   Document Released: 11/24/2005 Document Revised: 02/28/2014 Elsevier Interactive Patient Education [employer].

## 2015-07-29 ENCOUNTER — Telehealth: Payer: Self-pay

## 2015-07-29 NOTE — Telephone Encounter (Signed)
Patient needs FMLA forms completed by Dr. Katrinka BlazingSmith for her husband to be able to be out of work to take care of her when she gets her high blood pressure and stomach/arm pains. States she needs him to be there to help her. His employer is making them do FMLA just to be safe, she states that he will need it to be covered for a month or two. I had to call the interpreter line because she does not speak [place]. If you need more information or something doesn't add up with the visit that you had with her please let me know and I will see what I can do to get more information. I have completed what I could from her OV notes and highlighted the areas that need to be completed or that I wasn't sure about I will place them in your box on 07/29/15. Please complete and return to the FMLA box at the 102 checkout desk within 5-7 business days. Thank you

## 2015-07-31 ENCOUNTER — Ambulatory Visit (INDEPENDENT_AMBULATORY_CARE_PROVIDER_SITE_OTHER): Payer: Self-pay | Admitting: Family Medicine

## 2015-07-31 VITALS — BP 150/84 | HR 66 | Temp 98.2°F | Resp 15 | Ht <= 58 in | Wt 166.0 lb

## 2015-07-31 DIAGNOSIS — I10 Essential (primary) hypertension: Secondary | ICD-10-CM

## 2015-07-31 DIAGNOSIS — R450 Nervousness: Secondary | ICD-10-CM

## 2015-07-31 NOTE — Progress Notes (Signed)
   Subjective:    Patient ID: Erin Huff, female    DOB: 06-29-1955, 60 y.o.   MRN: [id number]  HPI This is a 60 yo female who presents today for follow up of HTN. She is accompanied by her daughter and granddaughter. Her daughter is helping with language. She was seen 4 days ago by Dr. Katrinka BlazingSmith. She had hyponatremia and her lisinopril- HCTZ was dc'd and she was started on metoprolol XL 25 mg. Today she presents with complaint that it makes her feel nervous about 15 minutes after taking. The nervousness lasts for about 15 minutes then spontaneously resolves. She is very concerned about what is a "good" heart rate and what is a "good" blood pressure. According to her daughter, the patient will check her blood pressure several times and get very anxious that it is high. No headaches or visual changes.   Her blood sugar is running about 120-130 fasting.  Past Medical History  Diagnosis Date  . Hypertension   . Diabetes mellitus without complication (HCC)   . Thyroid disease    No past surgical history on file. No family history on file. Social History  Substance Use Topics  . Smoking status: Never Smoker   . Smokeless tobacco: None  . Alcohol Use: No    Review of Systems Per HPI    Objective:   Physical Exam Physical Exam  Constitutional: Oriented to person, place, and time. She appears well-developed and well-nourished.  HENT:  Head: Normocephalic and atraumatic.  Eyes: Conjunctivae are normal.  Neck: Normal range of motion. Neck supple.  Cardiovascular: Normal rate, regular rhythm and normal heart sounds.   Pulmonary/Chest: Effort normal and breath sounds normal.  Musculoskeletal: Normal range of motion.  Neurological: Alert and oriented to person, place, and time.  Skin: Skin is warm and dry.  Psychiatric: Normal mood and affect. Behavior is normal. Judgment and thought content normal.  Vitals reviewed.  BP 162/86 mmHg  Pulse 66  Temp(Src) 98.2 F (36.8 C)  (Oral)  Resp 15  Ht 4\' 10"  (1.473 m)  Wt 166 lb (75.297 kg)  BMI 34.70 kg/m2  SpO2 99% Repeat BP- 150/84    Assessment & Plan:  1. Essential hypertension - discussed and provided written information regarding taking her blood pressure as well as normal ranges for BP and HR - encouraged her to try to take metoprolol XL at bedtime with a small snack to avoid sensation of nervousness - discussed importance of looking at long range management of blood pressure and not get anxious over individual readings, recommended she only take her blood pressure twice a week and keep a log of readings to bring with her to see Dr. Katrinka BlazingSmith in follow up as arranged  2. Nervousness - she has a history of anxiety and I am unsure it this is a true side effect of the newly added metoprolol, encouraged her to stick with the medication to see if nervousness decreases.   Olean Reeeborah Gessner, FNP-BC  [facility]  07/31/2015 7:25 PM

## 2015-07-31 NOTE — Patient Instructions (Addendum)
Good blood pressure- less than 140/90 Good pulse (heart rate)- between 60-100  Please check blood pressure 2 times a week and keep a list to bring to your next appoinntment  Please take your toprol XK at bedtime with a small snack  Cmo tomarse la presin arterial (How to Take Your Blood Pressure) CMO CONSIGO UN TENSIMETRO?  Puede comprar un tensimetro casero en la farmacia de su localidad. A veces, el seguro cubre el costo si usted tiene Surveyor, mininguna receta.  Pregunte a su mdico qu tensimetro es mejor para usted. Existen distintos tipos, para el brazo y Warden/rangerla mueca.  Si decide comprar un tensimetro para controlarse la presin arterial en el brazo, mdase antes el brazo para poder comprar un brazalete del tamao adecuado. Para medirse el tamao del brazo:   Utilice una cinta mtrica que muestre tanto las pulgadas como los centmetros.   Coloque la cinta alrededor de la parte media superior del brazo. Probablemente necesite ayuda para IT trainerhacer la medicin.   Anote la medida del brazo en pulgadas y centmetros.   Para medir correctamente la presin arterial, es importante tener un brazalete del tamao Abbevilleadecuado.   Si el brazo mide hasta 13 pulgadas (hasta 34 centmetros), compre un tamao de brazalete de Elcoadulto.  Si el brazo mide entre 13 y 17 pulgadas (entre 35 y 44 centmetros), compre un tamao de brazalete de adulto de contextura grande.    Si el brazo mide entre 17 y 20 pulgadas (entre 45 y 52 centmetros), compre un tamao de brazalete para muslo de [place]adulto.  QU SIGNIFICAN LOS NMEROS?   Son Federated Department Storesdos nmeros los que conforman la presin arterial. Por ejemplo: 120/80.  El Gafferprimer nmero (120 en nuestro ejemplo) es la "presin sistlica". Es una medida de la presin en los vasos sanguneos cuando el corazn bombea la Annadasangre.  El segundo nmero (80 en nuestro ejemplo) es la "presin diastlica". Es una medida de la presin en los vasos sanguneos cuando el corazn descansa entre los  latidos.  El Firefightermdico le indicar cul debe ser su presin arterial. QU DEBO HACER ANTES DE CONTROLARME LA PRESIN ARTERIAL?   Trate de descansar o relajarse por al menos 30 minutos antes de controlarse la presin arterial.  No fume.  No consuma ninguna bebida con cafena, como:  Gaseosa.  Caf.  Hal Morales.  Contrlese la presin arterial en una habitacin tranquila.  Sintese y estire el brazo sobre una mesa. Mantenga el brazo aproximadamente al nivel del corazn. Relaje el brazo.  Asegrese de no tener las piernas cruzadas. CMO ME CONTROLO LA PRESIN ARTERIAL?  Siga las indicaciones que vienen con el tensimetro.  Asegrese de quitarse la ropa ajustada del brazo o la Genevamueca. Envuelva el brazalete alrededor TransMontaignede la parte superior del brazo o la Reedsburgmueca. Debe poder meter un dedo entre el brazalete y Cabin crewel brazo. Si no puede meter un dedo entre el brazalete y Cabin crewel brazo, est demasiado Strathconaajustado, y debe quitarlo y Programme researcher, broadcasting/film/videovolver a Clinical cytogeneticistcolocarlo.  Algunas unidades requieren que infle el brazalete de forma manual.  Las unidades automticas inflan el brazalete al presionar un botn.  El brazalete se desinfla de forma automtica en ambos modelos.  Cuando el brazalete est inflado, la unidad mide la presin arterial y el pulso. Las Magazine features editorlecturas se muestran en un monitor. Lanny HurstQudese quieto y respire normalmente mientras se infla el brazalete.  Obtener una lectura toma menos de un minuto.  Algunos modelos almacenan las lecturas en [place] Junctionuna memoria. Algunos proporcionan una impresin de las lecturas. Si su  mquina no almacena las lecturas, regstrelas por escrito.  Lleve las lecturas a su prxima visita al mdico.   Esta informacin no tiene Theme park manager el consejo del mdico. Asegrese de hacerle al mdico cualquier pregunta que tenga.   Document Released: 05/25/2010 Document Revised: 02/28/2014 Elsevier Interactive Patient Education [employer]    IF you received an x-ray today, you will receive  an invoice from [facility]. Please contact [facility] at [phone] with questions or concerns regarding your invoice.   IF you received labwork today, you will receive an invoice from [employer]. Please contact [facility] at [phone] with questions or concerns regarding your invoice.   Our billing staff will not be able to assist you with questions regarding bills from these companies.  You will be contacted with the lab results as soon as they are available. The fastest way to get your results is to activate your My Chart account. Instructions are located on the last page of this paperwork. If you have not heard from [place] regarding the results in 2 weeks, please contact this office.

## 2015-08-05 NOTE — Telephone Encounter (Signed)
Today is the 5th day is there anyone checking your box while you are on vacation do i need to send them to someone else to complete?

## 2015-08-07 DIAGNOSIS — Z0271 Encounter for disability determination: Secondary | ICD-10-CM

## 2015-08-10 ENCOUNTER — Encounter: Payer: Self-pay | Admitting: Family Medicine

## 2015-08-10 NOTE — Telephone Encounter (Signed)
FMLA forms completed; [facility] 08/11/15.

## 2015-08-11 NOTE — Telephone Encounter (Signed)
Paperwork was scanned and a copy was given to the patient on 08/11/15

## 2015-08-13 ENCOUNTER — Telehealth: Payer: Self-pay | Admitting: Emergency Medicine

## 2015-08-13 NOTE — Telephone Encounter (Signed)
-----   Message from Kristi M Smith, MD sent at 08/10/2015  9:07 AM EDT ----- Please see comments noted before and call patient with results. 

## 2015-08-19 ENCOUNTER — Telehealth: Payer: Self-pay | Admitting: Emergency Medicine

## 2015-08-19 NOTE — Telephone Encounter (Signed)
-----   Message from Ethelda ChickKristi M Smith, MD sent at 08/10/2015  9:07 AM EDT ----- Please see comments noted before and call patient with results.

## 2016-03-01 ENCOUNTER — Encounter: Payer: Self-pay | Admitting: Family Medicine

## 2016-03-01 ENCOUNTER — Ambulatory Visit: Payer: Self-pay

## 2016-03-01 ENCOUNTER — Ambulatory Visit: Payer: Self-pay | Attending: Family Medicine | Admitting: Family Medicine

## 2016-03-01 VITALS — BP 135/76 | HR 86 | Temp 98.3°F | Resp 18 | Ht <= 58 in | Wt 170.2 lb

## 2016-03-01 DIAGNOSIS — I10 Essential (primary) hypertension: Secondary | ICD-10-CM | POA: Insufficient documentation

## 2016-03-01 DIAGNOSIS — Z23 Encounter for immunization: Secondary | ICD-10-CM

## 2016-03-01 DIAGNOSIS — Z8639 Personal history of other endocrine, nutritional and metabolic disease: Secondary | ICD-10-CM

## 2016-03-01 DIAGNOSIS — M25512 Pain in left shoulder: Secondary | ICD-10-CM | POA: Insufficient documentation

## 2016-03-01 DIAGNOSIS — E119 Type 2 diabetes mellitus without complications: Secondary | ICD-10-CM | POA: Insufficient documentation

## 2016-03-01 DIAGNOSIS — Z7984 Long term (current) use of oral hypoglycemic drugs: Secondary | ICD-10-CM | POA: Insufficient documentation

## 2016-03-01 DIAGNOSIS — E039 Hypothyroidism, unspecified: Secondary | ICD-10-CM | POA: Insufficient documentation

## 2016-03-01 DIAGNOSIS — M25511 Pain in right shoulder: Secondary | ICD-10-CM | POA: Insufficient documentation

## 2016-03-01 LAB — POCT URINALYSIS DIPSTICK
BILIRUBIN UA: NEGATIVE
Glucose, UA: NEGATIVE
KETONES UA: NEGATIVE
NITRITE UA: NEGATIVE
PH UA: 6
Protein, UA: 30
Spec Grav, UA: 1.02
Urobilinogen, UA: 0.2

## 2016-03-01 LAB — POCT GLYCOSYLATED HEMOGLOBIN (HGB A1C): HEMOGLOBIN A1C: 9.6

## 2016-03-01 LAB — LIPID PANEL
CHOL/HDL RATIO: 3.7 ratio (ref <5.0)
Cholesterol: 194 mg/dL (ref <200)
HDL: 53 mg/dL (ref >50)
LDL CALC: 94 mg/dL (ref <100)
TRIGLYCERIDES: 235 mg/dL — AB (ref <150)
VLDL: 47 mg/dL — AB (ref <30)

## 2016-03-01 LAB — BASIC METABOLIC PANEL
BUN: 14 mg/dL (ref 7–25)
CHLORIDE: 102 mmol/L (ref 98–110)
CO2: 20 mmol/L (ref 20–31)
Calcium: 9.4 mg/dL (ref 8.6–10.4)
Creat: 0.81 mg/dL (ref 0.50–1.05)
Glucose, Bld: 285 mg/dL — ABNORMAL HIGH (ref 65–99)
POTASSIUM: 4.4 mmol/L (ref 3.5–5.3)
Sodium: 135 mmol/L (ref 135–146)

## 2016-03-01 LAB — GLUCOSE, POCT (MANUAL RESULT ENTRY): POC Glucose: 305 mg/dl — AB (ref 70–99)

## 2016-03-01 LAB — TSH: TSH: 5.64 mIU/L — ABNORMAL HIGH

## 2016-03-01 MED ORDER — METFORMIN HCL 1000 MG PO TABS: 1000.0000 mg | ORAL_TABLET | Freq: Two times a day (BID) | ORAL | 2 refills | Status: DC

## 2016-03-01 MED ORDER — GLIMEPIRIDE 2 MG PO TABS: 2.0000 mg | ORAL_TABLET | Freq: Every day | ORAL | 2 refills | Status: DC

## 2016-03-01 MED ORDER — LISINOPRIL 30 MG PO TABS
30.0000 mg | ORAL_TABLET | Freq: Every day | ORAL | 2 refills | Status: DC
Start: 2016-03-01 — End: 2016-05-30

## 2016-03-01 MED ORDER — ACETAMINOPHEN 500 MG PO TABS: 1000.0000 mg | ORAL_TABLET | Freq: Four times a day (QID) | ORAL | 0 refills | Status: DC | PRN

## 2016-03-01 NOTE — Progress Notes (Signed)
Subjective:   Patient ID: Erin Huff, female    DOB: [birth year], 61 y.o.   MRN: [id number]  Chief Complaint  Patient presents with  . Diabetes   HPI Erin Huff 61 y.o. female presents for DM and HTN. She reports having a glucometer at home. CBG's range in 140-160's.Denies SOB, CP, or swelling to extremities. She reports increased thirst and urination Denies any vision changes, or wounds. C/o shoulder pain. Denies any injury. Takes regular strength tylenol for pain with moderate relief of symptoms.  Past Medical History:  Diagnosis Date  . Diabetes mellitus without complication (HCC)   . Hypertension   . Thyroid disease     History reviewed. No pertinent surgical history.  History reviewed. No pertinent family history.  Social History   Social History  . Marital status: Married    Spouse name: N/A  . Number of children: N/A  . Years of education: N/A   Occupational History  . Not on file.   Social History Main Topics  . Smoking status: Never Smoker  . Smokeless tobacco: Not on file  . Alcohol use No  . Drug use: No  . Sexual activity: Not on file   Outpatient Medications Prior to Visit  Medication Sig Dispense Refill  . FLUoxetine (PROZAC) 20 MG tablet Take 20 mg by mouth daily.    Marland Kitchen. glimepiride (AMARYL) 2 MG tablet Take 2 mg by mouth daily with breakfast.    . hydrOXYzine (ATARAX/VISTARIL) 25 MG tablet Take 0.5-1 tablets (12.5-25 mg total) by mouth every 8 (eight) hours as needed for anxiety. 30 tablet 0  . levothyroxine (SYNTHROID, LEVOTHROID) 112 MCG tablet Take 112 mcg by mouth daily before breakfast.    . lisinopril (PRINIVIL,ZESTRIL) 20 MG tablet Take 1 tablet (20 mg total) by mouth daily. 30 tablet 3  . lisinopril-hydrochlorothiazide (PRINZIDE,ZESTORETIC) 20-12.5 MG per tablet Take 1 tablet by mouth daily.    . metFORMIN (GLUCOPHAGE) 500 MG tablet Take by mouth 2 (two) times daily with a meal.    . metoprolol succinate (TOPROL-XL) 25  MG 24 hr tablet Take 1 tablet (25 mg total) by mouth daily. Take with or immediately following a meal. 30 tablet 3  . zolpidem (AMBIEN) 10 MG tablet Take 10 mg by mouth at bedtime as needed for sleep.     No facility-administered medications prior to visit.     Allergies  Allergen Reactions  . Penicillins Rash    Review of Systems  Eyes: Negative.   Respiratory: Negative.   Cardiovascular: Negative.   Gastrointestinal: Negative.   Genitourinary: Positive for frequency.  Musculoskeletal: Positive for myalgias (shoulder pain).  Skin: Negative.   Endo/Heme/Allergies: Positive for polydipsia.      Objective:    Physical Exam  Constitutional: She is oriented to person, place, and time.  Eyes: Pupils are equal, round, and reactive to light. No scleral icterus.  Neck: Normal range of motion. No JVD present.  Cardiovascular: Normal rate, regular rhythm, normal heart sounds and intact distal pulses.   Pulmonary/Chest: Effort normal and breath sounds normal.  Abdominal: Soft. Bowel sounds are normal.  Musculoskeletal: Normal range of motion.       Right shoulder: She exhibits pain. She exhibits normal range of motion.       Left shoulder: She exhibits pain. She exhibits normal range of motion.  Neurological: She is alert and oriented to person, place, and time.  Skin:  Monofilament exam wnl.   BP 135/76 (BP Location: Left Arm, Patient Position: Sitting, Cuff  Size: Normal)   Pulse 86   Temp 98.3 F (36.8 C) (Oral)   Resp 18   Ht 4\' 10"  (1.473 m)   Wt 170 lb 3.2 oz (77.2 kg)   SpO2 98%   BMI 35.57 kg/m  Wt Readings from Last 3 Encounters:  07/31/15 166 lb (75.3 kg)  07/27/15 169 lb (76.7 kg)  07/11/15 172 lb (78 kg)    Immunization History  Administered Date(s) Administered  . Influenza Whole 12/24/2007, 12/25/2008, 12/08/2009  . Influenza,inj,Quad PF,36+ Mos 01/15/2015, 03/01/2016  . Pneumococcal Polysaccharide-23 10/26/2007  . Td 02/21/2001  . Tdap 11/07/2013   Lab  Results  Component Value Date   NA 135 03/01/2016   K 4.4 03/01/2016   CO2 20 03/01/2016   GLUCOSE 285 (H) 03/01/2016   BUN 14 03/01/2016   CREATININE 0.81 03/01/2016   BILITOT 0.6 07/27/2015   ALKPHOS 89 07/27/2015   AST 21 07/27/2015   ALT 23 07/27/2015   PROT 7.7 07/27/2015   ALBUMIN 4.8 07/27/2015   CALCIUM 9.4 03/01/2016   ANIONGAP 11 07/15/2015   Lab Results  Component Value Date   CHOL 179 02/10/2010   CHOL 160 03/27/2009   CHOL 181 12/25/2008   Lab Results  Component Value Date   HDL 53 03/01/2016   HDL 50 02/10/2010   HDL 46 03/27/2009   Lab Results  Component Value Date   LDLCALC 94 03/01/2016   LDLCALC 88 02/10/2010   LDLCALC 82 03/27/2009   Lab Results  Component Value Date   TRIG 235 (H) 03/01/2016   TRIG 203 (H) 02/10/2010   TRIG 159 (H) 03/27/2009   Lab Results  Component Value Date   CHOLHDL 3.6 Ratio 02/10/2010   CHOLHDL 3.5 Ratio 03/27/2009   CHOLHDL 3.9 Ratio 12/25/2008   Lab Results  Component Value Date   HGBA1C 9.6 03/01/2016   HGBA1C 7.8 07/27/2015   HGBA1C 7.7 (H) 01/15/2015      Assessment & Plan:   Problem List Items Addressed This Visit      Cardiovascular and Mediastinum   Hypertension   Relevant Medications   lisinopril (PRINIVIL,ZESTRIL) 30 MG tablet   Other Relevant Orders   Basic Metabolic Panel (Completed)   Microalbumin / creatinine urine ratio (Completed)     Endocrine   Diabetes mellitus (HCC) - Primary   Relevant Medications   metFORMIN (GLUCOPHAGE) 1000 MG tablet   glimepiride (AMARYL) 2 MG tablet   lisinopril (PRINIVIL,ZESTRIL) 30 MG tablet   Other Relevant Orders   HgB A1c (Completed)   Glucose (CBG) (Completed)   Basic Metabolic Panel (Completed)   Lipid panel (Completed)   Microalbumin / creatinine urine ratio (Completed)   POCT urinalysis dipstick (Completed)   Ambulatory referral to Ophthalmology    Other Visit Diagnoses    Acute pain of both shoulders       Relevant Medications    acetaminophen (TYLENOL) 500 MG tablet   History of hypothyroidism       Relevant Orders   TSH (Completed)   Needs flu shot       Relevant Orders   Flu Vaccine QUAD 36+ mos PF IM (Fluarix & Fluzone Quad PF) (Completed)     Meds ordered this encounter  Medications  . metFORMIN (GLUCOPHAGE) 1000 MG tablet    Sig: Take 1 tablet (1,000 mg total) by mouth 2 (two) times daily with a meal.    Dispense:  60 tablet    Refill:  2    Order Specific Question:  Supervising Provider    Answer:   Quentin Angst [id number]  . acetaminophen (TYLENOL) 500 MG tablet    Sig: Take 2 tablets (1,000 mg total) by mouth every 6 (six) hours as needed for mild pain or moderate pain.    Dispense:  60 tablet    Refill:  0    Order Specific Question:   Supervising Provider    Answer:   Quentin Angst [id number]  . glimepiride (AMARYL) 2 MG tablet    Sig: Take 1 tablet (2 mg total) by mouth daily before breakfast.    Dispense:  30 tablet    Refill:  2    Order Specific Question:   Supervising Provider    Answer:   Quentin Angst [id number]  . lisinopril (PRINIVIL,ZESTRIL) 30 MG tablet    Sig: Take 1 tablet (30 mg total) by mouth daily.    Dispense:  30 tablet    Refill:  2    Order Specific Question:   Supervising Provider    Answer:   Quentin Angst [id number]    Follow up: Return in about 1 week (around 03/08/2016) for BP and Glucose check.   Arrie Senate, FNP

## 2016-03-01 NOTE — Patient Instructions (Signed)
Come back in 1 week for blood pressure check and glucose check.  Hipertensin (Hypertension) El trmino hipertensin es otra forma de denominar a la presin arterial elevada. La presin arterial elevada fuerza al corazn a trabajar ms para bombear la sangre. Una lectura de la presin arterial consta de dos nmeros: uno ms alto sobre uno ms bajo (por ejemplo, 110/72). CUIDADOS EN EL HOGAR  Haga que el mdico le tome nuevamente la presin arterial.  Tome los medicamentos solamente como se lo haya indicado el mdico. Siga cuidadosamente las indicaciones. Los medicamentos pierden eficacia si omite dosis. El hecho de omitir las dosis tambin [place] el riesgo de otros problemas.  No fume.  Contrlese la presin arterial en su casa como se lo haya indicado el mdico. SOLICITE AYUDA SI:  Piensa que tiene una reaccin a los medicamentos que est tomando.  Tiene mareos o dolores de cabeza reiterados.  Se le inflaman (hinchan) los tobillos.  Tiene problemas de visin. SOLICITE AYUDA DE INMEDIATO SI:  Tiene un dolor de cabeza muy intenso y est confundido.  Se siente dbil, aturdido o se desmaya.  Tiene dolor en el pecho o el estmago (abdominal).  Tiene vmitos.  No puede respirar [employer]. ASEGRESE DE QUE:  Comprende estas instrucciones.  Controlar su afeccin.  Recibir ayuda de inmediato si no mejora o si empeora. Esta informacin no tiene Marine scientist el consejo del mdico. Asegrese de hacerle al mdico cualquier pregunta que tenga. Document Released: 07/28/2009 Document Revised: 02/12/2013 Document Reviewed: 11/30/2012 Elsevier Interactive Patient Education  2017 [employer]. Diabetes mellitus tipo2 en los adultos, cuidados personales (Type 2 Diabetes Mellitus, Self Care, Adult) Las personas que tienen diabetes tipo2 (diabetes mellitus tipo2) deben Contractor nivel de glucosa en la sangre bajo control. Es posible hacerlo a travs de lo  siguiente:  Nutricin.  Actividad fsica.  Cambios en el estilo de vida.  Medicamentos o insulina, si es necesario.  El 52 de los mdicos y de Producer, television/film/video. CMO ME CONTROLO EL NIVEL DE GLUCOSA EN LA SANGRE?  Contrlese la glucemia todos los [place], con la frecuencia que le hayan indicado.  Llame al mdico si el nivel de glucosa en la sangre est por encima de las cifras ideales en 2anlisis seguidos.  Contrlese el nivel de hemoglobina A1c al [employer] al ao. Realcese controles ms frecuentes si el mdico se lo indica. El mdico fijar los objetivos del tratamiento para usted. Generalmente, los resultados de los niveles de glucosa en la sangre deben ser los siguientes:  Antes de las comidas (preprandial): de 80 a '130mg'$ /dl (4,4 a 7,12mol/l).  Despus de las comidas (posprandial): menos de '180mg'$ /dl (1352ml/l).  Nivel de A1c: menos del 7%. QU DEBO SABER ACERCA DE LA GLUCEMIA ALTA? El nivel alto de glucosa en la sangre se denomina hiperglucemia. Conozca los signos de la hiperglucemia. Los signos pueden incluir lo siguiente:  Sentir que tiene lo siguiente:  Sed.  Apetito.  Mucho cansancio.  Necesidad de orGarment/textile technologiston mayor frecuencia que lo habitual.  Visin borrosa. QU DEBO SABER ACERCA DE LA GLUCEMIA BAJA? El nivel bajo de glucosa en la sangre se denomina hipoglucemia. Este cuadro ocurre cuando el nivel de glucosa en la sangre es igual o menor que '70mg'$ /dl (3,52m48m/l). Entre los sntomas se pueden incluir los siguientes:  Sentir que tiene lo siguiente:  Apetito.  Preocupacin o nervios (ansioso).  Sudoracin y piel hmeda.  Confusin.  Mareos.  Somnolencia.  Nuseas.  Tener lo siguiente:  Latidos cardacos rpidos (  palpitaciones).  Dolor de [place].  Cambios en la visin.  Una crisis de movimientos que no puede controlar (convulsiones).  Pesadillas.  Hormigueo y falta de sensibilidad (adormecimiento) alrededor de la boca, los  labios o la [place].  Dificultades para hacer lo siguiente:  Hablar.  Prestar atencin (concentrarse).  Moverse (coordinacin).  Dormir.  Temblores.  Desmayos.  Molestarse con facilidad (irritabilidad). Tratamiento de la glucemia baja  Para tratar la glucemia baja, ingiera un alimento o una bebida azucarada de inmediato. Si puede pensar con claridad y tragar de manera segura, siga la regla 15/15, que consiste en lo siguiente:  Consumir 15gramos de un hidrato de carbono de accin rpida. Algunos hidratos de carbono de accin rpida son los siguientes:  1pomo de glucosa en gel.  3comprimidos de azcar (comprimidos de glucosa).  6 a 8unidades de caramelos duros.  4onzas (183m) de jugo de frutas.  4onzas (1267m de gaseosa comn (no diettica).  Contrlese la glucemia 1547mtos despus de ingerir el hidrato de carbono.  Si el nivel de glucosa en la sangre todava es igual o menor que '70mg'$ /dl (3,9mm22ml), ingiera nuevamente 15gramos de un hidrato de carbono.  Si el nivel de glucosa en la sangre no supera los '70mg'$ /dl (3,9mmo47m) despus de 3intentos, solicite ayuda de inmediato.  Ingiera una comida o una colacin en el transcurso de 1hora despus de que la glucemia se haya normalizado. Tratamiento de la glucemia muy baja  Si el nivel de glucosa en la sangre es igual o menor que '54mg'$ /dl (3mmol46m, significa que est muy bajo (hipoglucemia grave). Esto es una emEngineer, maintenance (IT)spere hasta que los sntomas desaparezcan. Solicite atencin mdica de inmediato. Comunquese con el servicio de emergencias de su localidad (911 en los [place]). No conduzca por sus propios medios hasta Principal Financialu nivel de glucosa en la sangre muy bajo y no puede ingerir ningn alimento ni bebida, tal vez deba aplicarse una inyeccin de glucagn. Un familiar o un amigo deben aprender a controlarle la glucemia y a aplicarle una inyeccin de glucagn. Pregntele al mdico si debe  tener un kit de inyecciones de glucagn en su casa. QU OTRAS COSAS SON IMPORTANTES PARA CONTROLAR LA DIABETES? Medicamentos  Siga estas indicaciones respecto de la insulina y los medicamentos para la diabetes:  Tmelos como se lo haya indicado el mdico.  Ajstelos como se lo haya indicado el mdico.  No se quede sin medicamentos. La diabetes puede aumentar el riesgo de tener otras enfermedades a largo Barrister's clerks incluyen las cardiopatas coronarias o enfermedades renales. El mdico puede recetar medicamentos para evitar los problemas causados por la diabetes. Alimento   Opte por opciones de alimentos saludables. Estos incluyen los siguientes:  Pollo, pescado, claras de huevos y frijoles.  Avena, salvado, trigo burgol, arroz integral, quinua y mijo.  FrutasLambert Modyduras frescas.  Productos lcteos descremados.  Frutos secos, aguacate, aceite de oliva [place] Forkite de canola.  Arme un plan de alimentacin con un especialista (nutricionista).  Siga las indicaciones del mdico respecto de lo que no puede comer o beber.  Beba suficiente lquido para mantener el pis (orina) claro o de color amarillo plido.  Ingiera colaciones saludables entre comidas saludables.  Lleve un registro de los hidratos de carbono que consume. Lea las etiquetas de los alimentos. Conozca el tamao de las porciones de los alimentos.  Siga el plan para los das de enfermedad cuando no pueda comer o beber normalmente. Arme este plan con el mdico, de modo que est listo para  usarlo. Actividad   Practique ejercicios al menos 3veces por semana.  No deje pasar ms de 2das sin hacer actividad fsica.  Hable con el mdico antes de comenzar un ejercicio nuevo. Es posible que el mdico deba hacer ajustes en la [place], los medicamentos o los alimentos. Estilo de vida   No use productos que contengan tabaco. Estos incluyen cigarrillos, tabaco de Higher education careers adviser y cigarrillos electrnicos. Si necesita ayuda para dejar  de fumar, consulte al mdico.  Pregntele a su mdico qu cantidad de alcohol puede tomar.  Aprenda a sobrellevar el estrs. Si necesita ayuda para lograrlo, consulte al [employer]. Cuidado del cuerpo   Mantngase al da con las vacunas.  Hgase controlar los ojos y los pies por un mdico con la frecuencia que le hayan indicado.  Revsese la piel y [employer]. Examine si hay cortes, moretones, enrojecimiento, ampollas o llagas.  Cepllese los dientes y [place].  Use el hilo dental al menos una vez al da.  Vaya al dentista al menos una vez cada 29mses.  Mantenga un peso saludable. Instrucciones generales   TDelphide venta libre y los recetados solamente como se lo haya indicado el mdico.  Comparta su plan de atencin de la diabetes con estas personas:  Compaeros de trabajo o de la escuela.  Aquellas con las que cMont [place]  Hgase anlisis de orina para dProduct managerpresencia de cetonas:  Cuando est enfermo.  Como se lo haya indicado el mdico.  Lleve consigo una tarjeta, o use un brazalete o una medalla que indiquen que es diabtico.  Pregntele al mdico lo siguiente:  Debo reunirme con uRadio broadcast assistantpara el cuidado de la diabetes?  Dnde puedo encontrar un grupo de apoyo para personas diabticas?  Concurra a todas las visitas de control como se lo haya indicado el mdico. Esto es importante. DNDE ENCONTRAR MS INFORMACIN: Para obtener ms informacin sobre la diabetes, visite los siguientes sitios:  Asociacin Americana de la Diabetes (American Diabetes Association): www.diabetes.org  Asociacin Norteamericana de Instructores para el Cuidado de la Diabetes (American Association of Diabetes Educators): www.diabeteseducator.org/patient-resources Esta informacin no tiene cMarine scientistel consejo del mdico. Asegrese de hacerle al mdico cualquier pregunta que tenga. Document Released: 06/01/2015 Document  Revised: 06/01/2015 Document Reviewed: 03/13/2015 Elsevier Interactive Patient Education  2017 [employer]

## 2016-03-01 NOTE — Progress Notes (Signed)
Patient is here for HTN & Diabetes   Patient have not taken their meds or they have not eaten at all today.  Patient stated that she doesn't have any pain right now.

## 2016-03-02 LAB — MICROALBUMIN / CREATININE URINE RATIO
Creatinine, Urine: 251 mg/dL (ref 20–320)
MICROALB UR: 1 mg/dL
MICROALB/CREAT RATIO: 4 ug/mg{creat} (ref <30)

## 2016-03-03 ENCOUNTER — Other Ambulatory Visit: Payer: Self-pay | Admitting: Family Medicine

## 2016-03-03 DIAGNOSIS — E039 Hypothyroidism, unspecified: Secondary | ICD-10-CM

## 2016-03-03 DIAGNOSIS — N3001 Acute cystitis with hematuria: Secondary | ICD-10-CM

## 2016-03-03 MED ORDER — NITROFURANTOIN MONOHYD MACRO 100 MG PO CAPS: 100.0000 mg | ORAL_CAPSULE | Freq: Two times a day (BID) | ORAL | 0 refills | Status: AC

## 2016-03-07 ENCOUNTER — Other Ambulatory Visit: Payer: Self-pay | Admitting: Family Medicine

## 2016-03-07 ENCOUNTER — Telehealth: Payer: Self-pay

## 2016-03-07 DIAGNOSIS — E782 Mixed hyperlipidemia: Secondary | ICD-10-CM

## 2016-03-07 DIAGNOSIS — E039 Hypothyroidism, unspecified: Secondary | ICD-10-CM

## 2016-03-07 MED ORDER — ASPIRIN EC 81 MG PO TBEC: 81.0000 mg | DELAYED_RELEASE_TABLET | Freq: Every day | ORAL | 2 refills | Status: DC

## 2016-03-07 MED ORDER — ATORVASTATIN CALCIUM 10 MG PO TABS: 10.0000 mg | ORAL_TABLET | Freq: Every day | ORAL | 2 refills | Status: DC

## 2016-03-07 MED ORDER — LEVOTHYROXINE SODIUM 125 MCG PO TABS: 125.0000 ug | ORAL_TABLET | Freq: Every day | ORAL | 0 refills | Status: DC

## 2016-03-07 NOTE — Telephone Encounter (Signed)
Patient Verify DOB  Patient was aware and understood  her results   Patient did not had any further questions

## 2016-03-07 NOTE — Telephone Encounter (Signed)
-----   Message from Lizbeth BarkMandesia R Hairston, FNP sent at 03/07/2016  4:43 AM EST ----- -Lipid levels were elevated. Elevated lipids can increase your risk of heart disease. You were prescribed atorvastatin and a low dose aspirin to decrease this risk. Will recheck levels in 3 months. -TSH level which evaluates thyroid function is abnormal. I have prescribed levothyroxine 125 mcg. Come back in 6 weeks for thyroid lab. -Urine test showed UTI. You were prescribed an antibiotic called nitrofurantoin, finish taking this this medication. Return for follow up if you develop fevers, chills, side abdominal pain, or cloudy foul-smelling urine.

## 2016-03-09 ENCOUNTER — Telehealth: Payer: Self-pay | Admitting: Family Medicine

## 2016-03-09 ENCOUNTER — Ambulatory Visit: Payer: Self-pay | Admitting: Family Medicine

## 2016-03-09 NOTE — Telephone Encounter (Signed)
Patient was prescribed aspirin and has been taking it. Pt. Would like to talk to pcp because she states it makes her feel sick and would like to know if she should stop taking it or could the dosage be reduced.  Patient asked if she could also take tylenol for pain?  Please follow up with patient

## 2016-03-11 ENCOUNTER — Other Ambulatory Visit: Payer: Self-pay | Admitting: Family Medicine

## 2016-03-11 DIAGNOSIS — E782 Mixed hyperlipidemia: Secondary | ICD-10-CM

## 2016-03-11 MED ORDER — ASPIRIN 75 MG PO CHEW: 75.0000 mg | CHEWABLE_TABLET | Freq: Every day | ORAL | 2 refills | Status: DC

## 2016-03-11 NOTE — Telephone Encounter (Signed)
Patient stated that  it does not upset her stomach, but it makes her very sleepy and it drowns her, she thinks that probably is to much of the dose, & the patient ask if you can lower the dose.

## 2016-03-11 NOTE — Telephone Encounter (Signed)
Patient should try taking aspirin with food and not on an empty stomach for a few days. If she continues to feel nausea then she can call the office again to notify. She was prescribed aspirin to lower her risk of heart disease not for pain. Tylenol can be taken for pain.

## 2016-03-11 NOTE — Telephone Encounter (Signed)
CMA call to inform that her PCP already lower the dose of the medication no answer left a vm stating that she already lower the dose

## 2016-03-11 NOTE — Telephone Encounter (Signed)
CMA call patient for advise to tell her to take the ASPIRIN WITH FOOD IF IT UPSET THE STOMACH, but patient said that it doesn't make her nauseous, but it drowns her and it makes her sleepy and she feels bad whenever she takes it.

## 2016-03-11 NOTE — Telephone Encounter (Signed)
Drowsiness usually is not a side affect of aspirin therapy. However I will lower the dose.

## 2016-03-15 ENCOUNTER — Ambulatory Visit: Payer: Self-pay | Attending: Physician Assistant | Admitting: Physician Assistant

## 2016-03-15 VITALS — BP 151/80 | HR 97 | Temp 98.3°F | Resp 18 | Wt 167.0 lb

## 2016-03-15 DIAGNOSIS — T39015A Adverse effect of aspirin, initial encounter: Secondary | ICD-10-CM | POA: Insufficient documentation

## 2016-03-15 DIAGNOSIS — Z789 Other specified health status: Secondary | ICD-10-CM

## 2016-03-15 DIAGNOSIS — J0110 Acute frontal sinusitis, unspecified: Secondary | ICD-10-CM | POA: Insufficient documentation

## 2016-03-15 DIAGNOSIS — X58XXXA Exposure to other specified factors, initial encounter: Secondary | ICD-10-CM | POA: Insufficient documentation

## 2016-03-15 DIAGNOSIS — I10 Essential (primary) hypertension: Secondary | ICD-10-CM | POA: Insufficient documentation

## 2016-03-15 MED ORDER — GUAIFENESIN ER 600 MG PO TB12: 1200.0000 mg | ORAL_TABLET | Freq: Two times a day (BID) | ORAL | 0 refills | Status: AC

## 2016-03-15 MED ORDER — AZITHROMYCIN 250 MG PO TABS: ORAL_TABLET | ORAL | 0 refills | Status: AC

## 2016-03-15 MED ORDER — ASPIRIN EC 81 MG PO TBEC: 81.0000 mg | DELAYED_RELEASE_TABLET | Freq: Every day | ORAL | 11 refills | Status: AC

## 2016-03-15 MED ORDER — ACETAMINOPHEN 500 MG PO TABS: 500.0000 mg | ORAL_TABLET | Freq: Four times a day (QID) | ORAL | 0 refills | Status: DC | PRN

## 2016-03-15 NOTE — Progress Notes (Signed)
Pt feels that ASA she is taking is causing her to feel fatigue. Last dose was Sunday pm Has intermittent HA's

## 2016-03-15 NOTE — Patient Instructions (Addendum)
Please keep watch of your blood pressure. Return here if still Elevated after taking your sinusitis medications. Good blood pressure is 120/80. Watch your salt intake. Limit salt to 1,500 mg per day.   Dolor de cabeza sinusal (Sinus Headache) El dolor de cabeza sinusal ocurre cuando los senos paranasales se taponan o se inflaman. Los senos paranasales son cavidades de aire que estn dentro de los huesos de la cara. Los dolores de cabeza sinusales pueden ser leves o intensos. CAUSAS El dolor de cabeza sinusal puede deberse a diferentes trastornos que Ameren Corporationafectan los senos paranasales, por ejemplo:  Resfros.  Infecciones de los senos paranasales.  Alergias. SNTOMAS El principal sntoma de esta afeccin es un dolor de cabeza que se puede percibir Education officer, environmentalcomo dolor u opresin en el rostro, la frente, los odos o las piezas dentales superiores. Las personas que tienen dolor de cabeza sinusal suelen tener otros sntomas, por ejemplo:  Secrecin o congestin nasal.  Grant RutsFiebre.  Incapacidad de Lubrizol Corporationpercibir olores. Los cambios climticos pueden intensificar los sntomas. DIAGNSTICO Esta afeccin se puede diagnosticar en funcin de lo siguiente:  Un examen fsico y [place]a historia clnica.  Estudios de diagnstico por imgenes, como una tomografa computarizada (TC) y una resonancia magntica (RM), para Engineer, manufacturingdetectar la presencia de problemas en los senos paranasales.  Un especialista puede observar el interior de los senos paranasales con una herramienta que tiene una cmara (endoscopio). TRATAMIENTO El tratamiento de esta afeccin depende de la causa.  El dolor sinusal causado por una infeccin de los senos paranasales se puede tratar con antibiticos.  El dolor sinusal causado por alergias se puede aliviar con medicamentos antialrgicos (antihistamnicos) y Production manageraerosoles nasales medicinales.  El dolor sinusal causado por congestin se puede Paramedicaliviar al irrigar la Clinical cytogeneticistnariz y los senos paranasales con una solucin  salina. INSTRUCCIONES PARA EL CUIDADO EN EL HOGAR  Tome los medicamentos solamente como se lo haya indicado el mdico.  Si le recetaron antibiticos, asegrese de terminarlos, incluso si comienza a sentirse mejor.  Si est congestionado, utilice un aerosol nasal para ayudar a International aid/development workerreducir la opresin.  Si se lo indican, pngase un pao tibio y Bristol-Myers Squibbhmedo sobre el rostro para ayudar a Engineer, materialsaliviar el dolor. SOLICITE ATENCIN MDICA SI:  Tiene dolores de cabeza ms de una vez por semana.  Tiene sensibilidad a la luz o al ruido.  Tiene fiebre.  Tiene malestar estomacal (nuseas) o vomita.  Los dolores de cabeza no mejoran con Scientist, research (medical)el tratamiento. Muchas personas creen que tienen dolor de cabeza sinusal cuando en realidad sufren jaquecas o cefaleas tensionales. SOLICITE ATENCIN MDICA DE INMEDIATO SI:  Tiene problemas de visin.  Siente un dolor intenso repentino en el rostro o la cabeza.  Tiene convulsiones.  Se siente confundido.  Presenta rigidez en el cuello. Esta informacin no tiene Theme park managercomo fin reemplazar el consejo del mdico. Asegrese de hacerle al mdico cualquier pregunta que tenga. Document Released: 02/07/2005 Document Revised: 06/24/2014 Document Reviewed: 02/03/2014 Elsevier Interactive Patient Education  2017 [employer]

## 2016-03-15 NOTE — Progress Notes (Signed)
Patient ID: Erin Huff, female   DOB: 04-Dec-1955, 61 y.o.   MRN: [id number]    Subjective:  Patient ID: Erin Huff, female    DOB: 03/18/1955  Age: 61 y.o. MRN: [id number]  CC: Frontal headache  HPI Erin Huff is a 61 yo Hispanic female with a PMH of DM2 and HTN that presents for frontal headache and nasal congestion since two days ago. Pain is localized at the frontal sinus bilaterally with associated eye pressure bilaterally. Took OTC Tylenol with moderate relief of the headache. She was previously ill (approx. 7-10 days ago) with viral symptoms but feels much better and no longer reports chills, cough, fatigue, body aches. Has received the flu vaccine this year. Denies chest pain, SOB, abdominal pain, f/c/n/v, rash, or GI/GU sxs.  Outpatient Medications Prior to Visit  Medication Sig Dispense Refill  . atorvastatin (LIPITOR) 10 MG tablet Take 1 tablet (10 mg total) by mouth daily. 30 tablet 2  . FLUoxetine (PROZAC) 20 MG tablet Take 20 mg by mouth daily.    Marland Kitchen glimepiride (AMARYL) 2 MG tablet Take 1 tablet (2 mg total) by mouth daily before breakfast. 30 tablet 2  . hydrOXYzine (ATARAX/VISTARIL) 25 MG tablet Take 0.5-1 tablets (12.5-25 mg total) by mouth every 8 (eight) hours as needed for anxiety. 30 tablet 0  . levothyroxine (SYNTHROID, LEVOTHROID) 125 MCG tablet Take 1 tablet (125 mcg total) by mouth daily. 60 tablet 0  . lisinopril (PRINIVIL,ZESTRIL) 30 MG tablet Take 1 tablet (30 mg total) by mouth daily. 30 tablet 2  . metFORMIN (GLUCOPHAGE) 1000 MG tablet Take 1 tablet (1,000 mg total) by mouth 2 (two) times daily with a meal. 60 tablet 2  . zolpidem (AMBIEN) 10 MG tablet Take 10 mg by mouth at bedtime as needed for sleep.    Marland Kitchen acetaminophen (TYLENOL) 500 MG tablet Take 2 tablets (1,000 mg total) by mouth every 6 (six) hours as needed for mild pain or moderate pain. 60 tablet 0  . aspirin 75 MG chewable tablet Chew 1 tablet (75 mg total) by  mouth daily. 30 tablet 2   No facility-administered medications prior to visit.     ROS Review of Systems  Constitutional: Negative for chills, fatigue and fever.  HENT: Positive for congestion, rhinorrhea, sinus pain and sinus pressure. Negative for ear pain and sore throat.   Eyes: Positive for pain.  Respiratory: Negative for cough and shortness of breath.   Cardiovascular: Negative for chest pain.  Gastrointestinal: Negative for abdominal pain, nausea and vomiting.  Genitourinary: Negative for dysuria.  Skin: Negative for rash.  Neurological: Positive for headaches. Negative for dizziness and weakness.  Psychiatric/Behavioral: Negative for confusion.    Objective:  BP (!) 151/80 (Patient Position: Sitting, Cuff Size: Normal)   Pulse 97   Temp 98.3 F (36.8 C) (Oral)   Resp 18   Wt 167 lb (75.8 kg)   SpO2 99%   BMI 34.90 kg/m   BP/Weight 03/15/2016 03/01/2016 07/31/2015  Systolic BP 151 135 150  Diastolic BP 80 76 84  Wt. (Lbs) 167 170.2 166  BMI 34.9 35.57 34.7     Physical Exam  Constitutional: She is oriented to person, place, and time.  NAD, obese, polite  HENT:  Head: Normocephalic and atraumatic.  TM normal bilaterally. Turbinates erythematous and hypertrophic bilaterally with rhinorrhea  Eyes: Conjunctivae and EOM are normal. Pupils are equal, round, and reactive to light. Right eye exhibits no discharge. Left eye exhibits no discharge. No scleral icterus.  Neck:  Normal range of motion. Neck supple.  Cardiovascular: Normal rate, regular rhythm, normal heart sounds and intact distal pulses.  Exam reveals no gallop and no friction rub.   No murmur heard. Pulmonary/Chest: Effort normal and breath sounds normal. No respiratory distress. She has no wheezes. She has no rales. She exhibits no tenderness.  Abdominal: Soft. Bowel sounds are normal. There is no tenderness.  Lymphadenopathy:    She has no cervical adenopathy.  Neurological: She is alert and oriented to  person, place, and time.  Skin: Skin is warm and dry. No rash noted. She is not diaphoretic.  Psychiatric: She has a normal mood and affect. Her behavior is normal. Thought content normal.     Assessment & Plan:   1. Acute non-recurrent frontal sinusitis - azithromycin (ZITHROMAX Z-PAK) 250 MG tablet; Take as directed on package instructions.  Dispense: 6 each; Refill: 0 - guaiFENesin (MUCINEX) 600 MG 12 hr tablet; Take 2 tablets (1,200 mg total) by mouth 2 (two) times daily.  Dispense: 20 tablet; Refill: 0 - acetaminophen (TYLENOL) 500 MG tablet; Take 1 tablet (500 mg total) by mouth every 6 (six) hours as needed.  Dispense: 30 tablet; Refill: 0  2. Essential hypertension Patient likely uncontrolled today due to sinus headache. Reports normal pressures at home. Counseled to return for consult if BP remains elevated. - aspirin EC 81 MG tablet; Take 1 tablet (81 mg total) by mouth daily.  Dispense: 30 tablet; Refill: 11  3. Intolerance of drug Change to enteric coated aspirin. - aspirin EC 81 MG tablet; Take 1 tablet (81 mg total) by mouth daily.  Dispense: 30 tablet; Refill: 11   Meds ordered this encounter  Medications  . aspirin EC 81 MG tablet    Sig: Take 1 tablet (81 mg total) by mouth daily.    Dispense:  30 tablet    Refill:  11    Order Specific Question:   Supervising Provider    Answer:   Quentin AngstJEGEDE, OLUGBEMIGA E [id number]  . azithromycin (ZITHROMAX Z-PAK) 250 MG tablet    Sig: Take as directed on package instructions.    Dispense:  6 each    Refill:  0    Order Specific Question:   Supervising Provider    Answer:   Quentin AngstJEGEDE, OLUGBEMIGA E [id number]  . guaiFENesin (MUCINEX) 600 MG 12 hr tablet    Sig: Take 2 tablets (1,200 mg total) by mouth 2 (two) times daily.    Dispense:  20 tablet    Refill:  0    Order Specific Question:   Supervising Provider    Answer:   Quentin AngstJEGEDE, OLUGBEMIGA E [id number]  . acetaminophen (TYLENOL) 500 MG tablet    Sig: Take 1 tablet (500 mg total)  by mouth every 6 (six) hours as needed.    Dispense:  30 tablet    Refill:  0    Order Specific Question:   Supervising Provider    Answer:   Quentin AngstJEGEDE, OLUGBEMIGA E [id number]    Follow-up: Return in about 3 months (around 06/13/2016) for follow up of DM2. Loletta Specter.   [place] David Edison Nicholson PA

## 2016-03-17 ENCOUNTER — Ambulatory Visit: Payer: Self-pay | Admitting: Family Medicine

## 2016-03-17 ENCOUNTER — Ambulatory Visit: Payer: Self-pay | Admitting: Physician Assistant

## 2016-04-08 ENCOUNTER — Telehealth: Payer: Self-pay | Admitting: Family Medicine

## 2016-04-08 DIAGNOSIS — E039 Hypothyroidism, unspecified: Secondary | ICD-10-CM

## 2016-04-08 MED ORDER — LEVOTHYROXINE SODIUM 125 MCG PO TABS: 125.0000 ug | ORAL_TABLET | Freq: Every day | ORAL | 0 refills | Status: DC

## 2016-04-08 NOTE — Telephone Encounter (Signed)
Levothyroxine refilled x 30 days - patient will be due for follow up prior to next refill

## 2016-04-08 NOTE — Telephone Encounter (Signed)
Patient came by the office to request medication refill for levothyroxine (SYNTHROID, LEVOTHROID) 125 MCG tablet. [facility] [address]

## 2016-04-08 NOTE — Telephone Encounter (Signed)
Patient called the office to speak with nurse regarding an appt that was made for her to get a mammogram. Pt is confused as to who made it and where she needs to go. Pt has her appt on 04/12/2016.   Please follow up.  Thank you.

## 2016-04-11 NOTE — Telephone Encounter (Signed)
CMA call patient to verify what she needed  Patient was not sure where they they call her from for her mammogram  CMA gave their phone number so she should verify her appt

## 2016-04-13 ENCOUNTER — Telehealth: Payer: Self-pay | Admitting: Family Medicine

## 2016-04-13 DIAGNOSIS — E039 Hypothyroidism, unspecified: Secondary | ICD-10-CM

## 2016-04-13 MED ORDER — LEVOTHYROXINE SODIUM 125 MCG PO TABS: 125.0000 ug | ORAL_TABLET | Freq: Every day | ORAL | 0 refills | Status: DC

## 2016-04-13 NOTE — Telephone Encounter (Signed)
Patient contacted [facility] to pick up her prescription but prescription was not there . Can you please resend prescription refill for levothyroxine (SYNTHROID, LEVOTHROID) 125 MCG tablet.  Thank you.

## 2016-04-13 NOTE — Telephone Encounter (Signed)
Re-sent to pharmacy.

## 2016-05-30 ENCOUNTER — Other Ambulatory Visit: Payer: Self-pay

## 2016-05-30 ENCOUNTER — Ambulatory Visit: Payer: Self-pay | Attending: Family Medicine | Admitting: Family Medicine

## 2016-05-30 VITALS — BP 141/82 | HR 92 | Temp 98.3°F | Resp 18 | Ht 60.0 in | Wt 170.2 lb

## 2016-05-30 DIAGNOSIS — Z0000 Encounter for general adult medical examination without abnormal findings: Secondary | ICD-10-CM | POA: Insufficient documentation

## 2016-05-30 DIAGNOSIS — E039 Hypothyroidism, unspecified: Secondary | ICD-10-CM | POA: Insufficient documentation

## 2016-05-30 DIAGNOSIS — R0609 Other forms of dyspnea: Secondary | ICD-10-CM

## 2016-05-30 DIAGNOSIS — E782 Mixed hyperlipidemia: Secondary | ICD-10-CM | POA: Insufficient documentation

## 2016-05-30 DIAGNOSIS — E119 Type 2 diabetes mellitus without complications: Secondary | ICD-10-CM | POA: Insufficient documentation

## 2016-05-30 DIAGNOSIS — Z7982 Long term (current) use of aspirin: Secondary | ICD-10-CM | POA: Insufficient documentation

## 2016-05-30 DIAGNOSIS — I10 Essential (primary) hypertension: Secondary | ICD-10-CM | POA: Insufficient documentation

## 2016-05-30 LAB — POCT URINALYSIS DIPSTICK
Bilirubin, UA: NEGATIVE
Blood, UA: NEGATIVE
Glucose, UA: 500
KETONES UA: NEGATIVE
LEUKOCYTES UA: NEGATIVE
NITRITE UA: NEGATIVE
PH UA: 6.5 (ref 5.0–8.0)
PROTEIN UA: NEGATIVE
Spec Grav, UA: 1.015 (ref 1.030–1.035)
UROBILINOGEN UA: 0.2 (ref ?–2.0)

## 2016-05-30 LAB — GLUCOSE, POCT (MANUAL RESULT ENTRY)
POC Glucose: 262 mg/dl — AB (ref 70–99)
POC Glucose: 309 mg/dl — AB (ref 70–99)

## 2016-05-30 MED ORDER — INSULIN ASPART 100 UNIT/ML ~~LOC~~ SOLN
10.0000 [IU] | Freq: Once | SUBCUTANEOUS | Status: AC
  Administered 2016-05-30: 10 [IU] via SUBCUTANEOUS

## 2016-05-30 MED ORDER — ATORVASTATIN CALCIUM 10 MG PO TABS: 10.0000 mg | ORAL_TABLET | Freq: Every day | ORAL | 2 refills | Status: DC

## 2016-05-30 MED ORDER — LISINOPRIL 40 MG PO TABS: 40.0000 mg | ORAL_TABLET | Freq: Every day | ORAL | 0 refills | Status: DC

## 2016-05-30 MED ORDER — METFORMIN HCL 1000 MG PO TABS: 1000.0000 mg | ORAL_TABLET | Freq: Two times a day (BID) | ORAL | 2 refills | Status: DC

## 2016-05-30 MED ORDER — GLIMEPIRIDE 4 MG PO TABS: 4.0000 mg | ORAL_TABLET | Freq: Every day | ORAL | 2 refills | Status: DC

## 2016-05-30 MED ORDER — AMLODIPINE BESYLATE 5 MG PO TABS: 5.0000 mg | ORAL_TABLET | Freq: Every day | ORAL | 0 refills | Status: DC

## 2016-05-30 NOTE — Patient Instructions (Addendum)
Schedule appointment with PCP in 6 weeks. Start taking blood sugars twice a day. Bring glucose meter to next follow up visit. You will be called with your labs results.  La diabetes mellitus y los alimentos (Diabetes Mellitus and Food) Es importante que controle su nivel de azcar en la sangre (glucosa). El nivel de glucosa en sangre depende en gran medida de lo que usted come. Comer alimentos saludables en las cantidades [place] a lo largo del Futures trader, aproximadamente a la misma hora [employer], lo ayudar a Chief Operating Officer su nivel de Event organiser. [place]in puede ayudarlo a retrasar o Fish farm manager de la diabetes mellitus. Comer de [employer] saludable incluso puede ayudarlo a Event organiser de presin arterial y a Barista o Pharmacologist un peso saludable. Entre las recomendaciones generales para alimentarse y Water quality scientist los alimentos de forma saludable, se incluyen las siguientes:  Respetar las comidas principales y comer colaciones con regularidad. Evitar pasar largos perodos sin comer con el fin de perder peso.  Seguir una dieta que consista principalmente en alimentos de origen vegetal, como frutas, vegetales, frutos secos, legumbres y cereales integrales.  Utilizar mtodos de coccin a baja temperatura, como hornear, en lugar de mtodos de coccin a alta temperatura, como frer en abundante aceite. Trabaje con el nutricionista para aprender a Acupuncturist nutricional de las etiquetas de los alimentos. CMO PUEDEN AFECTARME LOS ALIMENTOS? Carbohidratos Los carbohidratos afectan el nivel de glucosa en sangre ms que cualquier otro tipo de alimento. El nutricionista lo ayudar a Chief Strategy Officer cuntos carbohidratos puede consumir en cada comida y ensearle a contarlos. El recuento de carbohidratos es importante para mantener la glucosa en sangre en un nivel saludable, en especial si utiliza insulina o toma determinados medicamentos para la diabetes mellitus. Alcohol El alcohol puede  provocar disminuciones sbitas de la glucosa en sangre (hipoglucemia), en especial si utiliza insulina o toma determinados medicamentos para la diabetes mellitus. La hipoglucemia es una afeccin que puede poner en peligro la vida. Los sntomas de la hipoglucemia (somnolencia, mareos y Administrator) son similares a los sntomas de haber consumido mucho alcohol. Si el mdico lo autoriza a beber alcohol, hgalo con moderacin y siga estas pautas:  Las mujeres no deben beber ms de un trago por da, y los hombres no deben beber ms de dos tragos por Futures trader. Un trago es igual a:  12 onzas (355 ml) de cerveza  5 onzas de vino (150 ml) de vino  1,5onzas (45ml) de bebidas espirituosas  No beba con el estmago vaco.  Mantngase hidratado. Beba agua, gaseosas dietticas o t helado sin azcar.  Las gaseosas comunes, los jugos y otros refrescos podran contener muchos carbohidratos y se Heritage manager. QU ALIMENTOS NO SE RECOMIENDAN? Cuando haga las elecciones de alimentos, es importante que recuerde que todos los alimentos son distintos. Algunos tienen menos nutrientes que otros por porcin, aunque podran tener la misma cantidad de caloras o carbohidratos. Es difcil darle al cuerpo lo que necesita cuando consume alimentos con menos nutrientes. Estos son algunos ejemplos de alimentos que debera evitar ya que contienen muchas caloras y carbohidratos, pero pocos nutrientes:  Neurosurgeon trans (la mayora de los alimentos procesados incluyen grasas trans en la etiqueta de Informacin nutricional).  Gaseosas comunes.  Jugos.  Caramelos.  Dulces, como tortas, pasteles, rosquillas y [place].  Comidas fritas. QU ALIMENTOS PUEDO COMER? Consuma alimentos ricos en nutrientes, que nutrirn el cuerpo y lo mantendrn saludable. Los alimentos que debe comer tambin dependern de varios factores, como:  Las  caloras que necesita.  Los medicamentos que toma.  Su peso.  El nivel de glucosa en  [place].  El [place] de presin arterial.  El nivel de colesterol. Debe consumir una amplia variedad de alimentos, por ejemplo:  Protenas.  Cortes de [employer].  Protenas con bajo contenido de grasas saturadas, como pescado, clara de huevo y frijoles. Evite las carnes procesadas.  Frutas y vegetales.  Frutas y Sports administrator que pueden ayudar a Chief Operating Officer los niveles sanguneos de [place], como [place], mangos y batatas.  Productos lcteos.  Elija productos lcteos sin grasa o con bajo contenido de [place], como [place], yogur y [place].  Cereales, panes, pastas y arroz.  Elija cereales integrales, como panes multicereales, avena en grano y arroz integral. Estos alimentos pueden ayudar a controlar la presin arterial.  Rosalin Hawking.  Alimentos que contengan grasas saludables, como frutos secos, Chartered certified accountant, aceite de [place], aceite de canola y pescado. TODOS LOS QUE PADECEN DIABETES MELLITUS TIENEN EL MISMO PLAN DE COMIDAS? Dado que todas las personas que padecen diabetes mellitus son distintas, no hay un solo plan de comidas que funcione para todos. Es muy importante que se rena con un nutricionista que lo ayudar a crear un plan de comidas adecuado para usted. Esta informacin no tiene Theme park manager el consejo del mdico. Asegrese de hacerle al mdico cualquier pregunta que tenga. Document Released: 05/17/2007 Document Revised: 02/28/2014 Document Reviewed: 01/04/2013 Elsevier Interactive Patient Education  2017 [employer].

## 2016-05-30 NOTE — Progress Notes (Signed)
Patient is here for Thyroid & med refill  Patient stated that since february she been feeling like out breathe for 5 minutes & than it goes away it happen 1 or 2 times a week  Patient denies pain for today

## 2016-05-30 NOTE — Progress Notes (Signed)
Subjective:  Patient ID: Erin Huff, female    DOB: 04/03/1955  Age: 61 y.o. MRN: [id number]  CC: Establish Care   HPI Erin Huff presents for   DM and HTN:  She reports having a glucometer at home. CBG's range in 140-150's in the am. Reports only taking blood glucose once a day. She denies any vision changes, polyuria, polydipsia, or wounds.   HTN: Adherence with antihypertensive medications. Denies CP, swelling to extremities, or dizziness. Reports 1 week history of dyspnea when house cleaning. Denies any wheezing or cough. History of anxiety and thyroid disease. Denies any racing thoughts or worry. Reports taking a deep breath help to relieve symptoms.    Outpatient Medications Prior to Visit  Medication Sig Dispense Refill  . acetaminophen (TYLENOL) 500 MG tablet Take 1 tablet (500 mg total) by mouth every 6 (six) hours as needed. 30 tablet 0  . aspirin EC 81 MG tablet Take 1 tablet (81 mg total) by mouth daily. 30 tablet 11  . FLUoxetine (PROZAC) 20 MG tablet Take 20 mg by mouth daily.    . hydrOXYzine (ATARAX/VISTARIL) 25 MG tablet Take 0.5-1 tablets (12.5-25 mg total) by mouth every 8 (eight) hours as needed for anxiety. 30 tablet 0  . zolpidem (AMBIEN) 10 MG tablet Take 10 mg by mouth at bedtime as needed for sleep.    Marland Kitchen atorvastatin (LIPITOR) 10 MG tablet Take 1 tablet (10 mg total) by mouth daily. 30 tablet 2  . glimepiride (AMARYL) 2 MG tablet Take 1 tablet (2 mg total) by mouth daily before breakfast. 30 tablet 2  . levothyroxine (SYNTHROID, LEVOTHROID) 125 MCG tablet Take 1 tablet (125 mcg total) by mouth daily. 30 tablet 0  . lisinopril (PRINIVIL,ZESTRIL) 30 MG tablet Take 1 tablet (30 mg total) by mouth daily. 30 tablet 2  . metFORMIN (GLUCOPHAGE) 1000 MG tablet Take 1 tablet (1,000 mg total) by mouth 2 (two) times daily with a meal. 60 tablet 2   No facility-administered medications prior to visit.     ROS Review of Systems    Constitutional: Negative.   Eyes: Negative.   Respiratory: Positive for shortness of breath (with activity).   Cardiovascular: Negative.   Gastrointestinal: Negative.   Endocrine: Negative.   Skin: Negative.   Neurological: Negative.     Objective:  BP (!) 141/82 (BP Location: Left Arm, Patient Position: Sitting, Cuff Size: Normal)   Pulse 92   Temp 98.3 F (36.8 C) (Oral)   Resp 18   Ht 5' (1.524 m)   Wt 170 lb 3.2 oz (77.2 kg)   SpO2 99%   BMI 33.24 kg/m   BP/Weight 05/30/2016 03/15/2016 03/01/2016  Systolic BP 141 151 135  Diastolic BP 82 80 76  Wt. (Lbs) 170.2 167 170.2  BMI 33.24 34.9 35.57    Physical Exam  Constitutional: She is oriented to person, place, and time. She appears well-developed and well-nourished.  Eyes: Conjunctivae are normal. Pupils are equal, round, and reactive to light.  Cardiovascular: Normal rate, regular rhythm, normal heart sounds and intact distal pulses.   Pulmonary/Chest: Effort normal and breath sounds normal. No respiratory distress. She has no wheezes.  Abdominal: Soft. Bowel sounds are normal.  Neurological: She is alert and oriented to person, place, and time.  Skin: Skin is warm and dry.  Nursing note and vitals reviewed.    Depression screen [facility] 2/9 05/30/2016 03/01/2016 07/31/2015  Decreased Interest 0 1 0  Down, Depressed, Hopeless 0 1 0  PHQ -  2 Score 0 2 0  Altered sleeping 0 0 -  Tired, decreased energy 1 1 -  Change in appetite 0 0 -  Feeling bad or failure about yourself  0 0 -  Trouble concentrating 0 0 -  Moving slowly or fidgety/restless 0 0 -  Suicidal thoughts 0 0 -  PHQ-9 Score 1 3 -   GAD 7 : Generalized Anxiety Score 05/30/2016 03/01/2016  Nervous, Anxious, on Edge 0 1  Control/stop worrying 0 3  Worry too much - different things 0 3  Trouble relaxing 0 1  Restless 0 1  Easily annoyed or irritable 1 1  Afraid - awful might happen 0 1  Total GAD 7 Score 1 11    Diabetic Foot Exam - Simple   Simple Foot  Form Diabetic Foot exam was performed with the following findings:  Yes 05/30/2016  2:55 PM  Visual Inspection No deformities, no ulcerations, no other skin breakdown bilaterally:  Yes Sensation Testing Intact to touch and monofilament testing bilaterally:  Yes Pulse Check Posterior Tibialis and Dorsalis pulse intact bilaterally:  Yes Comments     Assessment & Plan:   Problem List Items Addressed This Visit      Cardiovascular and Mediastinum   Hypertension   -Schedule appointment with PCP in 6 weeks.   Relevant Medications   atorvastatin (LIPITOR) 10 MG tablet   lisinopril (PRINIVIL,ZESTRIL) 40 MG tablet   amLODipine (NORVASC) 5 MG tablet     Endocrine   Hypothyroidism - Primary   -Schedule appointment with PCP in 6 weeks.   Relevant Orders   TSH (Completed)   Diabetes mellitus (HCC)   -Inc. CBG's  to BID. Bring log to next follow up visit.   -Schedule appointment with PCP in 6 weeks.   Relevant Medications   insulin aspart (novoLOG) injection 10 Units (Completed)   glimepiride (AMARYL) 4 MG tablet   atorvastatin (LIPITOR) 10 MG tablet   metFORMIN (GLUCOPHAGE) 1000 MG tablet   lisinopril (PRINIVIL,ZESTRIL) 40 MG tablet   Other Relevant Orders   Glucose (CBG) (Completed)   Urinalysis Dipstick (Completed)   Glucose (CBG) (Completed)   Ambulatory referral to Ophthalmology    Other Visit Diagnoses    Mixed hyperlipidemia       Relevant Medications   atorvastatin (LIPITOR) 10 MG tablet   lisinopril (PRINIVIL,ZESTRIL) 40 MG tablet   amLODipine (NORVASC) 5 MG tablet   Other Relevant Orders   Lipid Panel (Completed)   Hepatic Function Panel (Completed)   Healthcare maintenance       Relevant Orders   MM Digital Screening   Ambulatory referral to Gastroenterology   Hepatitis C Antibody (Completed)   DOE (dyspnea on exertion)       -EKG in office showed normal findings.    -Due to lack of other cardiac associated symptoms and normal EKG, suspect that symptoms are not    cardiac in origin.   Relevant Orders   EKG 12-Lead      Meds ordered this encounter  Medications  . insulin aspart (novoLOG) injection 10 Units  . glimepiride (AMARYL) 4 MG tablet    Sig: Take 1 tablet (4 mg total) by mouth daily before breakfast.    Dispense:  90 tablet    Refill:  2    Order Specific Question:   Supervising Provider    Answer:   Quentin Angst [id number]  . atorvastatin (LIPITOR) 10 MG tablet    Sig: Take 1 tablet (10 mg total) by  mouth daily.    Dispense:  30 tablet    Refill:  2    Order Specific Question:   Supervising Provider    Answer:   Quentin Angst [id number]  . metFORMIN (GLUCOPHAGE) 1000 MG tablet    Sig: Take 1 tablet (1,000 mg total) by mouth 2 (two) times daily with a meal.    Dispense:  60 tablet    Refill:  2    Order Specific Question:   Supervising Provider    Answer:   Quentin Angst [id number]  . lisinopril (PRINIVIL,ZESTRIL) 40 MG tablet    Sig: Take 1 tablet (40 mg total) by mouth daily.    Dispense:  90 tablet    Refill:  0    Order Specific Question:   Supervising Provider    Answer:   Quentin Angst [id number]  . amLODipine (NORVASC) 5 MG tablet    Sig: Take 1 tablet (5 mg total) by mouth daily.    Dispense:  90 tablet    Refill:  0    Order Specific Question:   Supervising Provider    Answer:   Quentin Angst [[id number]]    Follow-up: Return in about 2 weeks (around 06/13/2016) for BP & DM check with clinical pharmacist.   Lizbeth Bark FNP

## 2016-05-31 LAB — HEPATIC FUNCTION PANEL
ALT: 50 IU/L — ABNORMAL HIGH (ref 0–32)
AST: 42 IU/L — ABNORMAL HIGH (ref 0–40)
Albumin: 4.5 g/dL (ref 3.5–5.5)
Alkaline Phosphatase: 139 IU/L — ABNORMAL HIGH (ref 39–117)
Bilirubin Total: 0.3 mg/dL (ref 0.0–1.2)
Bilirubin, Direct: 0.11 mg/dL (ref 0.00–0.40)
TOTAL PROTEIN: 7.6 g/dL (ref 6.0–8.5)

## 2016-05-31 LAB — LIPID PANEL
CHOL/HDL RATIO: 4 ratio (ref 0.0–4.4)
Cholesterol, Total: 196 mg/dL (ref 100–199)
HDL: 49 mg/dL (ref >39)
LDL CALC: 96 mg/dL (ref 0–99)
Triglycerides: 253 mg/dL — ABNORMAL HIGH (ref 0–149)
VLDL CHOLESTEROL CAL: 51 mg/dL — AB (ref 5–40)

## 2016-05-31 LAB — TSH: TSH: 0.022 u[IU]/mL — AB (ref 0.450–4.500)

## 2016-05-31 LAB — HEPATITIS C ANTIBODY: HEP C VIRUS AB: 0.2 {s_co_ratio} (ref 0.0–0.9)

## 2016-06-01 ENCOUNTER — Telehealth: Payer: Self-pay | Admitting: Family Medicine

## 2016-06-01 ENCOUNTER — Other Ambulatory Visit: Payer: Self-pay | Admitting: Family Medicine

## 2016-06-01 DIAGNOSIS — R7989 Other specified abnormal findings of blood chemistry: Secondary | ICD-10-CM

## 2016-06-01 DIAGNOSIS — E039 Hypothyroidism, unspecified: Secondary | ICD-10-CM

## 2016-06-01 DIAGNOSIS — R945 Abnormal results of liver function studies: Secondary | ICD-10-CM

## 2016-06-01 MED ORDER — LEVOTHYROXINE SODIUM 112 MCG PO TABS: 112.0000 ug | ORAL_TABLET | Freq: Every day | ORAL | 0 refills | Status: DC

## 2016-06-01 NOTE — Telephone Encounter (Signed)
Pt called requesting lab results, pt also mentioned that the new dosage of medication is causing her to feel tired and sleepy all day, please f/up

## 2016-06-01 NOTE — Telephone Encounter (Signed)
Pt called requesting lab results, pt also mentioned that the new dosage of medication is causing her to feel tired and sleepy all day, please f/up

## 2016-06-02 ENCOUNTER — Encounter: Payer: Self-pay | Admitting: Internal Medicine

## 2016-06-02 NOTE — Telephone Encounter (Signed)
CMA call patient to inform about  Lab results  Patient Verify DOB  Patient was aware and understood

## 2016-06-02 NOTE — Telephone Encounter (Signed)
-----   Message from Lizbeth Bark, FNP sent at 06/01/2016  4:39 PM EDT ----- -Hepatitis C is negative.  -Thyroid levels shows thyroid is over-functioning. This can cause symptoms of hyperthyroidism like palpitations, nervousness, or restlessness. Will decrease dose of levothyroxine. Repeat lab in 8 weeks.  -Lipid levels were elevated. This can increase your risk of heart disease.  -Start eating a diet low in saturated fat. Limit your intake of fried foods, red meats, and whole milk. Increase physical activity.  -Liver function test were elevated. This could indicate a liver problem. Recommend repeat in 2 weeks.

## 2016-06-02 NOTE — Telephone Encounter (Signed)
Ask patient about the range of her am blood sugars. She could be placed back on the glipizide 2 mg but it would need to be taken twice a day to help lower her blood sugars.

## 2016-06-03 ENCOUNTER — Ambulatory Visit: Payer: Self-pay | Attending: Family Medicine | Admitting: Physician Assistant

## 2016-06-03 VITALS — BP 123/88 | HR 126 | Temp 100.0°F | Resp 20

## 2016-06-03 DIAGNOSIS — Z7982 Long term (current) use of aspirin: Secondary | ICD-10-CM | POA: Insufficient documentation

## 2016-06-03 DIAGNOSIS — J069 Acute upper respiratory infection, unspecified: Secondary | ICD-10-CM

## 2016-06-03 DIAGNOSIS — J029 Acute pharyngitis, unspecified: Secondary | ICD-10-CM

## 2016-06-03 DIAGNOSIS — E119 Type 2 diabetes mellitus without complications: Secondary | ICD-10-CM

## 2016-06-03 DIAGNOSIS — E039 Hypothyroidism, unspecified: Secondary | ICD-10-CM

## 2016-06-03 DIAGNOSIS — I10 Essential (primary) hypertension: Secondary | ICD-10-CM

## 2016-06-03 DIAGNOSIS — Z7984 Long term (current) use of oral hypoglycemic drugs: Secondary | ICD-10-CM | POA: Insufficient documentation

## 2016-06-03 LAB — POCT GLYCOSYLATED HEMOGLOBIN (HGB A1C): Hemoglobin A1C: 8.6

## 2016-06-03 LAB — GLUCOSE, POCT (MANUAL RESULT ENTRY): POC Glucose: 312 mg/dl — AB (ref 70–99)

## 2016-06-03 LAB — POCT RAPID STREP A (OFFICE): Rapid Strep A Screen: NEGATIVE

## 2016-06-03 MED ORDER — LEVOTHYROXINE SODIUM 112 MCG PO TABS: 112.0000 ug | ORAL_TABLET | Freq: Every day | ORAL | 0 refills | Status: DC

## 2016-06-03 MED ORDER — CETIRIZINE HCL 10 MG PO TABS: 10.0000 mg | ORAL_TABLET | Freq: Every day | ORAL | 11 refills | Status: DC

## 2016-06-03 MED ORDER — FLUTICASONE PROPIONATE 50 MCG/ACT NA SUSP: 2.0000 | Freq: Every day | NASAL | 6 refills | Status: DC

## 2016-06-03 MED FILL — ?CETIRIZINE HCL 10 MG TABLE: 10 | 30 days supply | Qty: 30 | Fill #0

## 2016-06-03 MED FILL — FLUTICASONE PROP 50 MCG SPR: 50 | 30 days supply | Qty: 16 | Fill #0

## 2016-06-03 NOTE — Progress Notes (Signed)
Erin Huff, is a 61 y.o. female  UJW:[id number]  FAO:[id number]  DOB - [birth year]  Subjective:  Chief Complaint and HPI: Erin Huff is a 61 y.o. female here today for on and off nasal congestion, body aches, and scratchy throat.  Subjective fever last night.  No cough.  Symptoms present X 1 week, worse since yesterday.   Not taking OTC meds.  No sick contacts.  She did get a flu shot this year  Taking diabetes meds as directed.  Blood sugars ranging from 130-180 at home but high here today.  No s/sx hyper/hypoglycemia  "Rebeca" with stratus translating.    ROS:   Constitutional:  No f/c, No night sweats, No unexplained weight loss. EENT:  No vision changes, No blurry vision, No hearing changes. No mouth, throat, or ear problems except as stated above.  Respiratory: No cough, No SOB Cardiac: No CP, no palpitations GI:  No abd pain, No N/V/D. GU: No Urinary s/sx Musculoskeletal: No joint pain Neuro: No headache, no dizziness, no motor weakness.  Skin: No rash Endocrine:  No polydipsia. No polyuria.  Psych: Denies SI/HI  No problems updated.  ALLERGIES: Allergies  Allergen Reactions  . Penicillins Rash    PAST MEDICAL HISTORY: Past Medical History:  Diagnosis Date  . Diabetes mellitus without complication (HCC)   . Hypertension   . Thyroid disease     MEDICATIONS AT HOME: Prior to Admission medications   Medication Sig Start Date End Date Taking? Authorizing Provider  amLODipine (NORVASC) 5 MG tablet Take 1 tablet (5 mg total) by mouth daily. 05/30/16  Yes Lizbeth Bark, FNP  aspirin EC 81 MG tablet Take 1 tablet (81 mg total) by mouth daily. 03/15/16 03/15/17 Yes Roger Mariana Arn, PA-C  atorvastatin (LIPITOR) 10 MG tablet Take 1 tablet (10 mg total) by mouth daily. 05/30/16  Yes Lizbeth Bark, FNP  glimepiride (AMARYL) 4 MG tablet Take 1 tablet (4 mg total) by mouth daily before breakfast. 05/30/16  Yes Mandesia R Hairston, FNP    lisinopril (PRINIVIL,ZESTRIL) 40 MG tablet Take 1 tablet (40 mg total) by mouth daily. 05/30/16  Yes Lizbeth Bark, FNP  metFORMIN (GLUCOPHAGE) 1000 MG tablet Take 1 tablet (1,000 mg total) by mouth 2 (two) times daily with a meal. 05/30/16  Yes Lizbeth Bark, FNP  cetirizine (ZYRTEC) 10 MG tablet Take 1 tablet (10 mg total) by mouth daily. 06/03/16   Anders Simmonds, PA-C  FLUoxetine (PROZAC) 20 MG tablet Take 20 mg by mouth daily.    Historical Provider, MD  fluticasone (FLONASE) 50 MCG/ACT nasal spray Place 2 sprays into both nostrils daily. 06/03/16   Anders Simmonds, PA-C  hydrOXYzine (ATARAX/VISTARIL) 25 MG tablet Take 0.5-1 tablets (12.5-25 mg total) by mouth every 8 (eight) hours as needed for anxiety. Patient not taking: Reported on 06/03/2016 07/27/15   Ethelda Chick, MD  levothyroxine (SYNTHROID, LEVOTHROID) 112 MCG tablet Take 1 tablet (112 mcg total) by mouth daily before breakfast. 06/03/16   Anders Simmonds, PA-C  zolpidem (AMBIEN) 10 MG tablet Take 10 mg by mouth at bedtime as needed for sleep.    Historical Provider, MD     Objective:  EXAM:   Vitals:   06/03/16 1041  BP: 123/88  Pulse: (!) 126  Resp: 20  Temp: 100 F (37.8 C)  TempSrc: Oral  SpO2: 99%    General appearance : A&OX3. NAD. Non-toxic-appearing HEENT: Atraumatic and Normocephalic.  PERRLA. EOM intact.  TM clear B. Mouth-MMM,  post pharynx WNL w/ mild erythema, No PND. Neck: supple, no JVD. No cervical lymphadenopathy. No thyromegaly Chest/Lungs:  Breathing-non-labored, Good air entry bilaterally, breath sounds normal without rales, rhonchi, or wheezing  CVS: S1 S2 regular, no murmurs, gallops, rubs, pulse 88 at time of exam Extremities: Bilateral Lower Ext shows no edema, both legs are warm to touch with = pulse throughout Neurology:  CN II-XII grossly intact, Non focal.   Psych:  TP linear. J/I WNL. Normal speech. Appropriate eye contact and affect.  Skin:  No Rash  Data Review Lab Results   Component Value Date   HGBA1C 8.6 06/03/2016   HGBA1C 9.6 03/01/2016   HGBA1C 7.8 07/27/2015     Assessment & Plan   1. Type 2 diabetes mellitus without complication, without long-term current use of insulin (HCC) Uncontrolled, but A1C better than in January - POCT glucose (manual entry) - POCT glycosylated hemoglobin (Hb A1C) Check blood sugar in the mornings and at meal times and record and bring to next visit. Continue to work on cutting sugars out of your diet.  Increase water intake.    2. Upper respiratory tract infection, unspecified type vs allergies - cetirizine (ZYRTEC) 10 MG tablet; Take 1 tablet (10 mg total) by mouth daily.  Dispense: 30 tablet; Refill: 11 - fluticasone (FLONASE) 50 MCG/ACT nasal spray; Place 2 sprays into both nostrils daily.  Dispense: 16 g; Refill: 6 Fluids, rest, respiratory care  3. HYPERTENSION, BENIGN SYSTEMIC continue  4. Acquired hypothyroidism - levothyroxine (SYNTHROID, LEVOTHROID) 112 MCG tablet; Take 1 tablet (112 mcg total) by mouth daily before breakfast.  Dispense: 30 tablet; Refill: 0  5. Sore throat negative - Rapid Strep A  Patient have been counseled extensively about nutrition and exercise.  Spent >90mins face to face discussing proper diabetic diet.  Return in about 2 weeks (around 06/17/2016) for [facility] for diabetes management .  The patient was given clear instructions to go to ER or return to medical center if symptoms don't improve, worsen or new problems develop. The patient verbalized understanding. The patient was told to call to get lab results if they haven't heard anything in the next week.    Georgian Co, PA-C [facility] and [facility] [place], [place] [phone]   06/03/2016, 11:51 AM

## 2016-06-03 NOTE — Patient Instructions (Signed)
Check blood sugar in the mornings and at meal times and record and bring to next visit.     Recuento de carbohidratos para la diabetes mellitus en los adultos Carbohydrate Counting for Diabetes Mellitus, Adult El recuento de carbohidratos es un mtodo destinado a Erin Huff un registro de la cantidad de carbohidratos que se ingieren. La ingesta natural de carbohidratos aumenta la cantidad de azcar (glucosa) en la sangre. El recuento de la cantidad de carbohidratos que se ingieren sirve para que el nivel de glucosa sangunea (glucemia) permanezca dentro de los lmites normales, lo que ayuda a Erin Huff la diabetes (diabetes mellitus) bajo control. Es importante saber la cantidad de carbohidratos que se pueden ingerir en cada comida sin correr Erin Huff, Erin Huff. Esto es Erin Huff. Un especialista en alimentacin y nutricin (nutricionista certificado) puede ayudarlo a crear un plan de alimentacin y a calcular la cantidad de carbohidratos que debe ingerir en cada comida y colacin. Los siguientes alimentos incluyen carbohidratos:  Granos, como panes y cereales.  Frijoles secos y productos con soja.  Verduras con almidn, como papas, guisantes y maz.  Erin Huff y jugos de frutas.  Leche y Dentist.  Dulces y colaciones, como pasteles, galletas, caramelos, papas fritas y refrescos. Cmo se calculan los carbohidratos? Hay dos maneras de calcular los carbohidratos de los alimentos. Puede usar cualquiera de 1 Erin Huff o [place] combinacin de Erin Huff. Leer la etiqueta de informacin nutricional de los alimentos envasados  La lista de informacin nutricional est incluida en las etiquetas de casi todas las bebidas y los alimentos envasados de los Erin Huff. Incluye lo siguiente:  El tamao de la porcin.  Informacin sobre los nutrientes de cada porcin, incluidos los gramos (g) de carbohidratos por porcin. Para usar la informacin nutricional:  Decida cuntas porciones va a  comer.  Multiplique la cantidad de porciones por el nmero de carbohidratos por porcin.  El resultado es la cantidad total de carbohidratos que comer. Conocer los tamaos de las porciones estndar de otros alimentos  Cuando coma alimentos que contienen carbohidratos que no estn envasados o no incluyen la informacin nutricional en la etiqueta, debe medir las porciones para poder calcular la cantidad de carbohidratos:  Mida los alimentos que comer con una balanza de alimentos o una taza medidora, si es necesario.  Decida cuntas porciones de Programmer, systems.  Multiplique el nmero de porciones por15. La mayora de los alimentos con alto contenido de carbohidratos contienen unos 15g de carbohidratos por porcin.  Por ejemplo, si come 8onzas (170g) de fresas, habr comido 2porciones y 30g de carbohidratos (2porciones x 15g=30g).  En el caso de las comidas que contienen mezclas de ms de un alimento, como las sopas y los guisos, debe calcular los carbohidratos de cada alimento que se incluye. La siguiente [employer] tamaos de las porciones estndar de los alimentos corrientes con alto contenido de carbohidratos. Cada una de estas porciones tiene aproximadamente 15g de carbohidratos:  pan de hamburguesa o muffin ingls.  onza (15ml) de almbar.  onza (14g) de gelatina.  1rebanada de pan.  1tortilla de seispulgadas.  3onzas (85g) de arroz o pasta cocidos.  4onzas (113g) de frijoles secos cocidos.  4onzas (113g) de verduras con almidn, como guisantes, maz o papas.  4onzas (113g) de cereal caliente.  4onzas (113g) de pur de papas o de una papa grande al horno.  4onzas (113g) de frutas en lata o congeladas.  4onzas ( ) de jugo de frutas.  4a 6galletas.  6croquetas de pollo.  6onzas (170g) de cereales secos sin azcar.  6onzas (170g) de yogur descremado sin ningn agregado o de yogur endulzado con  edulcorante artificial.  8onzas (272ml) de [place].  8onzas (170g) de frutas frescas o una fruta pequea.  24onzas (680g) de palomitas de maz. Ejemplo de recuento de carbohidratos Ejemplo de comida   3onzas (85g) de pechuga de pollo.  6onzas (170g) de arroz integral.  4onzas (113g) de maz.  8onzas (219ml) de leche.  8onzas (170g) de fresas con crema batida sin azcar. Clculo de carbohidratos  1. Identifique los alimentos que contienen carbohidratos:  Arroz.  Maz.  Leche.  Erin Huff. 2. Calcule cuntas porciones come de cada alimento:  2 porciones de arroz.  1 porcin de maz.  Erin Huff.  1 porcin de fresas. 3. Multiplique cada nmero de porciones por 15g:  2 porciones de arroz x 15 g = 30 g.  1 porcin de maz x 15 g = 15 g.  1 porcin de leche x 15 g = 15 g.  1 porcin de fresas x 15 g = 15 g. 4. Sume todas las cantidades para conocer el total de gramos de carbohidratos consumidos:  30g + 15g + 15g + 15g = 75g de carbohidratos en total. Esta informacin no tiene como fin reemplazar el consejo del mdico. Asegrese de hacerle al mdico cualquier pregunta que tenga. Document Released: 05/02/2011 Document Revised: 01/26/2016 Document Reviewed: 07/22/2015 Elsevier Interactive Patient Education  2017 [employer].

## 2016-06-14 ENCOUNTER — Ambulatory Visit: Payer: Self-pay | Attending: Family Medicine | Admitting: Pharmacist

## 2016-06-14 VITALS — BP 135/81 | HR 76

## 2016-06-14 DIAGNOSIS — E1165 Type 2 diabetes mellitus with hyperglycemia: Secondary | ICD-10-CM | POA: Insufficient documentation

## 2016-06-14 DIAGNOSIS — Z7984 Long term (current) use of oral hypoglycemic drugs: Secondary | ICD-10-CM | POA: Insufficient documentation

## 2016-06-14 DIAGNOSIS — E119 Type 2 diabetes mellitus without complications: Secondary | ICD-10-CM

## 2016-06-14 DIAGNOSIS — I10 Essential (primary) hypertension: Secondary | ICD-10-CM | POA: Insufficient documentation

## 2016-06-14 MED ORDER — GLUCOSE BLOOD VI STRP: ORAL_STRIP | 12 refills | Status: DC

## 2016-06-14 MED ORDER — TRUEPLUS LANCETS 28G MISC: 5 refills | Status: DC

## 2016-06-14 MED ORDER — LISINOPRIL 30 MG PO TABS: 30.0000 mg | ORAL_TABLET | Freq: Every day | ORAL | 2 refills | Status: DC

## 2016-06-14 MED ORDER — TRUE METRIX METER W/DEVICE KIT: PACK | 0 refills | Status: AC

## 2016-06-14 NOTE — Progress Notes (Signed)
    S:     Chief Complaint  Patient presents with  . Medication Management    Patient arrives in good spirits.  Presents for diabetes evaluation, education, and management at the request of [facility]/Dr. Jegede. Patient was referred on 05/30/16.  Patient was last seen by Primary Care Provider on 05/30/16.   Patient reports adherence with medications.   Current diabetes medications include: glimepiride 4 mg daily, metformin 1000 mg BID  Current hypertension medications include: amlodipine 5 mg daily, lisinopril 30 mg daily (patient is prescribed 40 mg daily but it gives her nausea)  Patient denies hypoglycemic events.  Patient reported dietary habits: cut out fats and breads. Eats salads with tortillas (limits to 2)  Patient reported exercise habits: walks   Patient denies nocturia.  Patient denies neuropathy. Patient denies visual changes. Patient reports self foot exams.   Patient wants to know if there is a cheaper meter that she can get because the strips are expensive for her.   O:  Physical Exam   ROS   Lab Results  Component Value Date   HGBA1C 8.6 06/03/2016   Vitals:   06/14/16 1033  BP: 135/81  Pulse: 76    Home fasting CBG: 94, 114-130 2 hour post-prandial/random CBG: 170-200  A/P: Diabetes longstanding currently UNcontrolled based on A1c of 8.6 but very close to goal based on home readings. Patient denies hypoglycemic events and is able to verbalize appropriate hypoglycemia management plan. Patient reports adherence with medication. Control is suboptimal due to lifestyle.  Continue current diabetes medications as prescribed. Provided information on carb counting to get her post-prandial readings to goal. Educated patient on how to use meter and ordered a new meter that she will be able to afford.  History of hypertension, currently controlled. Patient is taking lisinopril 30 so will change the dose in her chart to reflect that. Continue other  medications as prescribed.  Next A1C anticipated July 2018.    Written patient instructions provided.  Total time in face to face counseling 30 minutes.   Follow up in Pharmacist Clinic Visit PRN, next visit with [facility].   Patient seen with Cheral Bay, PharmD Candidate

## 2016-06-14 NOTE — Patient Instructions (Addendum)
Thanks for coming to see [place]!  Get new meter at our pharmacy  I ordered lisinopril 30 mg daily  Follow up with Mand[place] next month   Recuento de carbohidratos para la diabetes mellitus en los adultos Carbohydrate Counting for Diabetes Mellitus, Adult El recuento de carbohidratos es un mtodo destinado a Midwife un registro de la cantidad de carbohidratos que se ingieren. La ingesta natural de carbohidratos aumenta la cantidad de azcar (glucosa) en la sangre. El recuento de la cantidad de carbohidratos que se ingieren sirve para que el nivel de glucosa sangunea (glucemia) permanezca dentro de los lmites normales, lo que ayuda a Pharmacologist la diabetes (diabetes mellitus) bajo control. Es importante saber la cantidad de carbohidratos que se pueden ingerir en cada comida sin correr Surveyor, minerals. Esto es Government social research officer. Un especialista en alimentacin y nutricin (nutricionista certificado) puede ayudarlo a crear un plan de alimentacin y a calcular la cantidad de carbohidratos que debe ingerir en cada comida y colacin. Los siguientes alimentos incluyen carbohidratos:  Granos, como panes y cereales.  Frijoles secos y productos con soja.  Verduras con almidn, como papas, guisantes y maz.  Nils Pyle y jugos de frutas.  Leche y Dentist.  Dulces y colaciones, como pasteles, galletas, caramelos, papas fritas y refrescos. Cmo se calculan los carbohidratos? Hay dos maneras de calcular los carbohidratos de los alimentos. Puede usar cualquiera de [address] o [place] combinacin de [place]. Leer la etiqueta de informacin nutricional de los alimentos envasados  La lista de informacin nutricional est incluida en las etiquetas de casi todas las bebidas y los alimentos envasados de los [place]. Incluye lo siguiente:  El tamao de la porcin.  Informacin sobre los nutrientes de cada porcin, incluidos los gramos (g) de carbohidratos por porcin. Para usar la informacin  nutricional:  Decida cuntas porciones va a comer.  Multiplique la cantidad de porciones por el nmero de carbohidratos por porcin.  El resultado es la cantidad total de carbohidratos que comer. Conocer los tamaos de las porciones estndar de otros alimentos  Cuando coma alimentos que contienen carbohidratos que no estn envasados o no incluyen la informacin nutricional en la etiqueta, debe medir las porciones para poder calcular la cantidad de carbohidratos:  Mida los alimentos que comer con una balanza de alimentos o una taza medidora, si es necesario.  Decida cuntas porciones de Programmer, systems.  Multiplique el nmero de porciones por15. La mayora de los alimentos con alto contenido de carbohidratos contienen unos 15g de carbohidratos por porcin.  Por ejemplo, si come 8onzas (170g) de fresas, habr comido 2porciones y 30g de carbohidratos (2porciones x 15g=30g).  En el caso de las comidas que contienen mezclas de ms de un alimento, como las sopas y los guisos, debe calcular los carbohidratos de cada alimento que se incluye. La siguiente [employer] tamaos de las porciones estndar de los alimentos corrientes con alto contenido de carbohidratos. Cada una de estas porciones tiene aproximadamente 15g de carbohidratos:  pan de hamburguesa o muffin ingls.  onza (15ml) de almbar.  onza (14g) de gelatina.  1rebanada de pan.  1tortilla de seispulgadas.  3onzas (85g) de arroz o pasta cocidos.  4onzas (113g) de frijoles secos cocidos.  4onzas (113g) de verduras con almidn, como guisantes, maz o papas.  4onzas (113g) de cereal caliente.  4onzas (113g) de pur de papas o de una papa grande al horno.  4onzas (113g) de frutas en lata o congeladas.  4onzas ( ) de jugo de frutas.  4a 6galletas.  6croquetas de pollo.  6onzas (170g) de cereales secos sin azcar.  6onzas (170g) de yogur descremado sin  ningn agregado o de yogur endulzado con edulcorante artificial.  8onzas (228m) de [place]  8onzas (170g) de frutas frescas o una fruta pequea.  24onzas (680g) de palomitas de maz. Ejemplo de recuento de carbohidratos Ejemplo de comida   3onzas (85g) de pechuga de pollo.  6onzas (170g) de arroz integral.  4onzas (113g) de maz.  8onzas (2422m de leche.  8onzas (170g) de fresas con crema batida sin azcar. Clculo de carbohidratos  1. Identifique los alimentos que contienen carbohidratos:  Arroz.  Maz.  Leche.  FrHughie Closs2. Calcule cuntas porciones come de cada alimento:  2 porciones de arroz.  1 porcin de maz.  1 [place] 1 porcin de fresas. 3. Multiplique cada nmero de porciones por 15g:  2 porciones de arroz x 15 g = 30 g.  1 porcin de maz x 15 g = 15 g.  1 porcin de leche x 15 g = 15 g.  1 porcin de fresas x 15 g = 15 g. 4. Sume todas las cantidades para conocer el total de gramos de carbohidratos consumidos:  30g + 15g + 15g + 15g = 75g de carbohidratos en total. Esta informacin no tiene como fin reemplazar el consejo del mdico. Asegrese de hacerle al mdico cualquier pregunta que tenga. Document Released: 05/02/2011 Document Revised: 01/26/2016 Document Reviewed: 07/22/2015 Elsevier Interactive Patient Education  2017 [employer]

## 2016-07-08 ENCOUNTER — Encounter (HOSPITAL_COMMUNITY): Payer: Self-pay | Admitting: Emergency Medicine

## 2016-07-08 ENCOUNTER — Ambulatory Visit (HOSPITAL_COMMUNITY)
Admission: EM | Admit: 2016-07-08 | Discharge: 2016-07-08 | Disposition: A | Discharge status: Home / Self Care | Payer: Self-pay | Attending: Internal Medicine | Admitting: Internal Medicine

## 2016-07-08 DIAGNOSIS — R739 Hyperglycemia, unspecified: Secondary | ICD-10-CM

## 2016-07-08 DIAGNOSIS — E1165 Type 2 diabetes mellitus with hyperglycemia: Secondary | ICD-10-CM

## 2016-07-08 LAB — GLUCOSE, CAPILLARY: GLUCOSE-CAPILLARY: 210 mg/dL — AB (ref 65–99)

## 2016-07-08 NOTE — ED Triage Notes (Signed)
Here for hyperglycemia onset 2 days... sts her blood sugar was 180 fasting after breakfast (cereal, tortillas w/eggs) was 320  Had her DM meds this am  Was told by PCP to come here for eval.   A&O x4... NAD... Ambulatory

## 2016-07-08 NOTE — ED Provider Notes (Signed)
CSN: [id number]     Arrival date & time 07/08/16  1255 History   First MD Initiated Contact with Patient 07/08/16 1407     Chief Complaint  Patient presents with  . Hyperglycemia   (Consider location/radiation/quality/duration/timing/severity/associated sxs/prior Treatment)  Patient presents with complaints of hyperglycemia. She has a history of type 2 diabetes mellitus and prescribed metformin and glimepiride. Last A1c was 8.6 on 06/03/2016. Patient reports compliance with her metformin therapy but states that she stopped the glimepiride about 2 weeks ago because she felt like she was "feeling okay" and is concerned about the number of prescribed medications that she is taking. Patient reports checking her glucose twice daily (once in the morning before eating and again in the evening). Her glucose typically runs around 200 but she has noticed levels as high as 300 over the past few days. She endorses increased thirst. Denies any polyuria or noticeable weight changes. No nausea, vomiting, vision changes or paresthesias. She is active and participates in [place] regularly.   Please note that the patient's primary language is Spanish. Interpretation provided by in house staff member who is fluent in understanding and speaking, patient's primary language.             Past Medical History:  Diagnosis Date  . Diabetes mellitus without complication ([place])   . Hypertension   . Thyroid disease    History reviewed. No pertinent surgical history. History reviewed. No pertinent family history. Social History  Substance Use Topics  . Smoking status: Never Smoker  . Smokeless tobacco: Never Used  . Alcohol use No   OB History    No data available     Review of Systems  Constitutional: Positive for activity change and fatigue. Negative for appetite change.  Gastrointestinal: Negative for abdominal pain, nausea and vomiting.  Endocrine: Positive for polydipsia. Negative for polyphagia and  polyuria.  Genitourinary: Negative for dysuria.  All other systems reviewed and are negative.   Allergies  Penicillins  Home Medications   Prior to Admission medications   Medication Sig Start Date End Date Taking? Authorizing Provider  amLODipine (NORVASC) 5 MG tablet Take 1 tablet (5 mg total) by mouth daily. 05/30/16  Yes Alfonse Spruce, FNP  aspirin EC 81 MG tablet Take 1 tablet (81 mg total) by mouth daily. 03/15/16 03/15/17 Yes Clent Demark, PA-C  levothyroxine (SYNTHROID, LEVOTHROID) 112 MCG tablet Take 1 tablet (112 mcg total) by mouth daily before breakfast. 06/03/16  Yes McClung, Angela M, PA-C  lisinopril (PRINIVIL,ZESTRIL) 30 MG tablet Take 1 tablet (30 mg total) by mouth daily. 06/14/16  Yes Tresa Garter, MD  metFORMIN (GLUCOPHAGE) 1000 MG tablet Take 1 tablet (1,000 mg total) by mouth 2 (two) times daily with a meal. 05/30/16  Yes [facility], Mandesia R, FNP  atorvastatin (LIPITOR) 10 MG tablet Take 1 tablet (10 mg total) by mouth daily. 05/30/16   Alfonse Spruce, FNP  Blood Glucose Monitoring Suppl (TRUE METRIX METER) w/Device KIT Use as directed 06/14/16   Tresa Garter, MD  cetirizine (ZYRTEC) 10 MG tablet Take 1 tablet (10 mg total) by mouth daily. 06/03/16   Argentina Donovan, PA-C  FLUoxetine (PROZAC) 20 MG tablet Take 20 mg by mouth daily.    [provider]  fluticasone (FLONASE) 50 MCG/ACT nasal spray Place 2 sprays into both nostrils daily. 06/03/16   Argentina Donovan, PA-C  glimepiride (AMARYL) 4 MG tablet Take 1 tablet (4 mg total) by mouth daily before breakfast. 05/30/16  Fredia Beets R, FNP  glucose blood (TRUE METRIX BLOOD GLUCOSE TEST) test strip Use as instructed 06/14/16   Tresa Garter, MD  hydrOXYzine (ATARAX/VISTARIL) 25 MG tablet Take 0.5-1 tablets (12.5-25 mg total) by mouth every 8 (eight) hours as needed for anxiety. Patient not taking: Reported on 06/03/2016 07/27/15   Wardell Honour, MD  TRUEPLUS LANCETS 28G MISC  Use as directed 06/14/16   Tresa Garter, MD  zolpidem (AMBIEN) 10 MG tablet Take 10 mg by mouth at bedtime as needed for sleep.    [provider]   Meds Ordered and Administered this Visit  Medications - No data to display  BP (!) 150/73 (BP Location: Left Arm)   Pulse 85   Temp 99 F (37.2 C) (Oral)   Resp 16   SpO2 100%  No data found.   Physical Exam  Constitutional: She is oriented to person, place, and time. She appears well-developed and well-nourished.  Neck: Normal range of motion.  Cardiovascular: Normal rate and regular rhythm.   Pulmonary/Chest: Effort normal and breath sounds normal.  Abdominal: Soft. Bowel sounds are normal.  Musculoskeletal: Normal range of motion.  Neurological: She is alert and oriented to person, place, and time.  Skin: Skin is warm and dry.  Psychiatric: She has a normal mood and affect.    Urgent Care Course     Procedures (including critical care time)  Labs Review Labs Reviewed  GLUCOSE, CAPILLARY - Abnormal; Notable for the following:       Result Value   Glucose-Capillary 210 (*)    All other components within normal limits    Imaging Review No results found.   Visual Acuity Review  Right Eye Distance:   Left Eye Distance:   Bilateral Distance:    Right Eye Near:   Left Eye Near:    Bilateral Near:         MDM   1. Hyperglycemia   2. Uncontrolled type 2 diabetes mellitus with hyperglycemia, without long-term current use of insulin [facility])    Patient has appointment scheduled with her PCP on Monday 07/11/16. Advised to continue checking glucose in AM before eating and in the evening. She should log her glucose reading. Also encouraged patient to take all medications that has been prescribed to her and to never stop any prescribed therapy unless advised by a healthcare provider.   Discussed diagnosis and treatment with patient. All questions have been answered and all concerns have been addressed. The  patient verbalized understanding and had no further questions     Enrique Sack, [place] 07/08/16 [place], [place], [place] 07/08/16 1434

## 2016-07-11 ENCOUNTER — Ambulatory Visit: Payer: Self-pay | Attending: Family Medicine | Admitting: Family Medicine

## 2016-07-11 ENCOUNTER — Ambulatory Visit: Payer: Self-pay

## 2016-07-11 VITALS — BP 133/77 | HR 79 | Temp 98.0°F | Resp 18 | Ht 60.0 in | Wt 161.6 lb

## 2016-07-11 DIAGNOSIS — Z0000 Encounter for general adult medical examination without abnormal findings: Secondary | ICD-10-CM

## 2016-07-11 DIAGNOSIS — Z79899 Other long term (current) drug therapy: Secondary | ICD-10-CM | POA: Insufficient documentation

## 2016-07-11 DIAGNOSIS — E039 Hypothyroidism, unspecified: Secondary | ICD-10-CM | POA: Insufficient documentation

## 2016-07-11 DIAGNOSIS — R5383 Other fatigue: Secondary | ICD-10-CM | POA: Insufficient documentation

## 2016-07-11 DIAGNOSIS — E119 Type 2 diabetes mellitus without complications: Secondary | ICD-10-CM | POA: Insufficient documentation

## 2016-07-11 DIAGNOSIS — A084 Viral intestinal infection, unspecified: Secondary | ICD-10-CM | POA: Insufficient documentation

## 2016-07-11 DIAGNOSIS — Z7982 Long term (current) use of aspirin: Secondary | ICD-10-CM | POA: Insufficient documentation

## 2016-07-11 DIAGNOSIS — E1165 Type 2 diabetes mellitus with hyperglycemia: Secondary | ICD-10-CM

## 2016-07-11 DIAGNOSIS — E0800 Diabetes mellitus due to underlying condition with hyperosmolarity without nonketotic hyperglycemic-hyperosmolar coma (NKHHC): Secondary | ICD-10-CM

## 2016-07-11 DIAGNOSIS — Z7984 Long term (current) use of oral hypoglycemic drugs: Secondary | ICD-10-CM | POA: Insufficient documentation

## 2016-07-11 DIAGNOSIS — Z8619 Personal history of other infectious and parasitic diseases: Secondary | ICD-10-CM

## 2016-07-11 DIAGNOSIS — R110 Nausea: Secondary | ICD-10-CM

## 2016-07-11 LAB — GLUCOSE, POCT (MANUAL RESULT ENTRY): POC GLUCOSE: 158 mg/dL — AB (ref 70–99)

## 2016-07-11 MED ORDER — METFORMIN HCL 1000 MG PO TABS: 1000.0000 mg | ORAL_TABLET | Freq: Two times a day (BID) | ORAL | 2 refills | Status: DC

## 2016-07-11 MED ORDER — ONDANSETRON HCL 4 MG PO TABS: 4.0000 mg | ORAL_TABLET | Freq: Three times a day (TID) | ORAL | 0 refills | Status: DC | PRN

## 2016-07-11 MED ORDER — GLIPIZIDE 5 MG PO TABS: 5.0000 mg | ORAL_TABLET | Freq: Every day | ORAL | 2 refills | Status: DC

## 2016-07-11 MED ORDER — ONE DAILY MULTIVITAMIN WOMEN PO TABS: 1.0000 | ORAL_TABLET | Freq: Every day | ORAL | Status: DC

## 2016-07-11 MED FILL — ?METFORMIN HCL 1,000 MG TAB: 1000 | 30 days supply | Qty: 60 | Fill #0

## 2016-07-11 MED FILL — glipiZIDE 5 MG TABS: 5 | 30 days supply | Qty: 30 | Fill #0

## 2016-07-11 MED FILL — ONDANSETRON HCL 4 MG TABLET: 4 | 6 days supply | Qty: 20 | Fill #0

## 2016-07-11 NOTE — Progress Notes (Signed)
Subjective:  Patient ID: Erin Huff, female    DOB: [birth year]  Age: 61 y.o. MRN: [id number]  CC: Diabetes   HPI Erin Huff presents for   Diabetes Mellitus: Patient presents for follow up of diabetes. Symptoms: none. Patient denies foot ulcerations, nausea, paresthesia of the feet, visual disturbances and vomitting.  Evaluation to date has been included: fasting blood sugar, fasting lipid panel, hemoglobin A1C and microalbuminuria.  Home sugars: patient does not check sugarsTreatment to date: Continued sulfonylurea which has been unable to assess effectiveness and Continued metformin which has been unable to assess effectiveness due to patient not checking blood sugars at home. She brings her glucometer still in its packaging to the office. Patient also reports history recent history of fatigue and diarrhea with nausea three days ago. She denies any fevers or bloody stools. She denies further episodes of diarrhea or vomiting. She reports ability to drink fluids and eats solids.   Hypothyroidism: Patient presents for evaluation of thyroid function. Symptoms consist of fatigue. Symptoms have present for 3 weeks. The symptoms are moderate.  The problem has been unchanged.  Previous thyroid studies include TSH. 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     Outpatient Medications Prior to Visit  Medication Sig Dispense Refill  . amLODipine (NORVASC) 5 MG tablet Take 1 tablet (5 mg total) by mouth daily. 90 tablet 0  . aspirin EC 81 MG tablet Take 1 tablet (81 mg total) by mouth daily. 30 tablet 11  . glucose blood (TRUE METRIX BLOOD GLUCOSE TEST) test strip Use as instructed 100 each 12  . levothyroxine (SYNTHROID, LEVOTHROID) 112 MCG tablet Take 1 tablet (112 mcg total) by mouth daily before breakfast. 30 tablet 0  . lisinopril (PRINIVIL,ZESTRIL) 30 MG tablet Take 1 tablet (30 mg total) by mouth daily. 30 tablet 2  . TRUEPLUS LANCETS 28G MISC Use as  directed 100 each 5  . glimepiride (AMARYL) 4 MG tablet Take 1 tablet (4 mg total) by mouth daily before breakfast. 90 tablet 2  . metFORMIN (GLUCOPHAGE) 1000 MG tablet Take 1 tablet (1,000 mg total) by mouth 2 (two) times daily with a meal. 60 tablet 2  . atorvastatin (LIPITOR) 10 MG tablet Take 1 tablet (10 mg total) by mouth daily. 30 tablet 2  . Blood Glucose Monitoring Suppl (TRUE METRIX METER) w/Device KIT Use as directed 1 kit 0  . cetirizine (ZYRTEC) 10 MG tablet Take 1 tablet (10 mg total) by mouth daily. 30 tablet 11  . FLUoxetine (PROZAC) 20 MG tablet Take 20 mg by mouth daily.    . fluticasone (FLONASE) 50 MCG/ACT nasal spray Place 2 sprays into both nostrils daily. 16 g 6  . hydrOXYzine (ATARAX/VISTARIL) 25 MG tablet Take 0.5-1 tablets (12.5-25 mg total) by mouth every 8 (eight) hours as needed for anxiety. (Patient not taking: Reported on 06/03/2016) 30 tablet 0  . zolpidem (AMBIEN) 10 MG tablet Take 10 mg by mouth at bedtime as needed for sleep.     No facility-administered medications prior to visit.     ROS Review of Systems  Constitutional: Negative.   Eyes: Negative.   Respiratory: Negative.   Cardiovascular: Negative.   Gastrointestinal: Negative.   Skin: Negative.   Neurological: Negative.   Psychiatric/Behavioral: Negative.     Objective:  BP 133/77 (BP Location: Left Arm, Patient Position: Sitting, Cuff Size: Normal)   Pulse 79   Temp 98 F (36.7 C) (Oral)   Resp 18   Ht 5' (1.524 m)  Wt 161 lb 9.6 oz (73.3 kg)   SpO2 99%   BMI 31.56 kg/m   BP/Weight 07/11/2016 07/08/2016 8/67/6195  Systolic BP 093 267 124  Diastolic BP 77 73 81  Wt. (Lbs) 161.6 - -  BMI 31.56 - -    Physical Exam  Constitutional: She appears well-developed and well-nourished.  HENT:  Head: Normocephalic.  Eyes: Conjunctivae are normal. Pupils are equal, round, and reactive to light.  Cardiovascular: Normal rate, regular rhythm, normal heart sounds and intact distal pulses.     Pulmonary/Chest: Effort normal and breath sounds normal.  Abdominal: Soft. Bowel sounds are normal.  Skin: Skin is warm and dry.  Nursing note and vitals reviewed.  Assessment & Plan:   Problem List Items Addressed This Visit      Endocrine   Hypothyroidism   Relevant Orders   TSH (Completed)   Diabetes mellitus ([place]) - Primary   Clinical pharmacist discussed with patient glucometer use.   Recommend checking blood sugars BID. Keep log of blood glucose and glucometer to next office visit.   Follow up with PCP in 3 months.   Relevant Medications   metFORMIN (GLUCOPHAGE) 1000 MG tablet   glipiZIDE (GLUCOTROL) 5 MG tablet   Other Relevant Orders   Glucose (CBG) (Completed)   Ambulatory referral to Ophthalmology    Other Visit Diagnoses    Fatigue, unspecified type       Relevant Medications   Multiple Vitamins-Minerals (ONE DAILY MULTIVITAMIN WOMEN) TABS   Other Relevant Orders   TSH (Completed)   CBC with Differential (Completed)   Basic Metabolic Panel (Completed)   History of viral gastroenteritis       Continue increased fluid intake   Relevant Orders   CBC with Differential (Completed)   Basic Metabolic Panel (Completed)   Nausea       Relevant Medications   ondansetron (ZOFRAN) 4 MG tablet   Healthcare maintenance       Relevant Orders   Ambulatory referral to Gastroenterology      Meds ordered this encounter  Medications  . metFORMIN (GLUCOPHAGE) 1000 MG tablet    Sig: Take 1 tablet (1,000 mg total) by mouth 2 (two) times daily with a meal.    Dispense:  60 tablet    Refill:  2    Order Specific Question:   Supervising Provider    Answer:   Tresa Garter [id number]  . glipiZIDE (GLUCOTROL) 5 MG tablet    Sig: Take 1 tablet (5 mg total) by mouth daily before breakfast.    Dispense:  30 tablet    Refill:  2    Order Specific Question:   Supervising Provider    Answer:   Tresa Garter [id number]  . ondansetron (ZOFRAN) 4 MG tablet    Sig:  Take 1 tablet (4 mg total) by mouth every 8 (eight) hours as needed for nausea or vomiting.    Dispense:  20 tablet    Refill:  0    Order Specific Question:   Supervising Provider    Answer:   Tresa Garter [id number]  . Multiple Vitamins-Minerals (ONE DAILY MULTIVITAMIN WOMEN) TABS    Sig: Take 1 tablet by mouth daily.    Order Specific Question:   Supervising Provider    Answer:   Tresa Garter [[id number]]    Follow-up: Return in about 2 weeks (around 07/25/2016) for DM with clinical pharmacist.  Alfonse Spruce FNP

## 2016-07-11 NOTE — Patient Instructions (Addendum)
STOP taking glimepiride. You will be called with your labs results. Check Blood sugars twice a day. Bring glucometer and blood sugar log to next office.  La diabetes mellitus y los alimentos (Diabetes Mellitus and Food) Es importante que controle su nivel de azcar en la sangre (glucosa). El nivel de glucosa en sangre depende en gran medida de lo que usted come. Comer alimentos saludables en las cantidades [place] a lo largo del Training and development officer, aproximadamente a la misma hora [employer], lo ayudar a Chief Technology Officer su nivel de Multimedia programmer. Tambin puede ayudarlo a retrasar o Patent attorney de la diabetes mellitus. Comer de [employer] saludable incluso puede ayudarlo a Chartered loss adjuster de presin arterial y a Science writer o Theatre manager un peso saludable. Entre las recomendaciones generales para alimentarse y Audiological scientist los alimentos de forma saludable, se incluyen las siguientes:  Respetar las comidas principales y comer colaciones con regularidad. Evitar pasar largos perodos sin comer con el fin de perder peso.  Seguir una dieta que consista principalmente en alimentos de origen vegetal, como frutas, vegetales, frutos secos, legumbres y cereales integrales.  Utilizar mtodos de coccin a baja temperatura, como hornear, en lugar de mtodos de coccin a alta temperatura, como frer en abundante aceite. Trabaje con el nutricionista para aprender a Financial planner nutricional de las etiquetas de los alimentos. CMO PUEDEN AFECTARME LOS ALIMENTOS? Carbohidratos Los carbohidratos afectan el nivel de glucosa en sangre ms que cualquier otro tipo de alimento. El nutricionista lo ayudar a Teacher, adult education cuntos carbohidratos puede consumir en cada comida y ensearle a contarlos. El recuento de carbohidratos es importante para mantener la glucosa en sangre en un nivel saludable, en especial si utiliza insulina o toma determinados medicamentos para la diabetes mellitus. Alcohol El alcohol puede provocar  disminuciones sbitas de la glucosa en sangre (hipoglucemia), en especial si utiliza insulina o toma determinados medicamentos para la diabetes mellitus. La hipoglucemia es una afeccin que puede poner en peligro la vida. Los sntomas de la hipoglucemia (somnolencia, mareos y Data processing manager) son similares a los sntomas de haber consumido mucho alcohol. Si el mdico lo autoriza a beber alcohol, hgalo con moderacin y siga estas pautas:  Las mujeres no deben beber ms de un trago por da, y los hombres no deben beber ms de dos tragos por Training and development officer. Un trago es igual a:  12 onzas (355 ml) de cerveza  5 onzas de vino (150 ml) de vino  1,5onzas (25m) de bebidas espirituosas  No beba con el estmago vaco.  Mantngase hidratado. Beba agua, gaseosas dietticas o t helado sin azcar.  Las gaseosas comunes, los jugos y otros refrescos podran contener muchos carbohidratos y se dCivil Service fast streamer QU ALIMENTOS NO SE RECOMIENDAN? Cuando haga las elecciones de alimentos, es importante que recuerde que todos los alimentos son distintos. Algunos tienen menos nutrientes que otros por porcin, aunque podran tener la misma cantidad de caloras o carbohidratos. Es difcil darle al cuerpo lo que necesita cuando consume alimentos con menos nutrientes. Estos son algunos ejemplos de alimentos que debera evitar ya que contienen muchas caloras y carbohidratos, pero pocos nutrientes:  GPhysicist, medicaltrans (la mayora de los alimentos procesados incluyen grasas trans en la etiqueta de Informacin nutricional).  Gaseosas comunes.  Jugos.  Caramelos.  Dulces, como tortas, pasteles, rosquillas y g[place]  Comidas fritas. QU ALIMENTOS PUEDO COMER? Consuma alimentos ricos en nutrientes, que nutrirn el cuerpo y lo mantendrn saludable. Los alimentos que debe comer tambin dependern de varios factores, como:  Las caloras que necesita.  Los medicamentos que toma.  Su peso.  El nivel de glucosa en [place].  El  [place] de presin arterial.  El nivel de colesterol. Debe consumir una amplia variedad de alimentos, por ejemplo:  Protenas.  Cortes de [employer].  Protenas con bajo contenido de grasas saturadas, como pescado, clara de huevo y frijoles. Evite las carnes procesadas.  Frutas y vegetales.  Frutas y Sports administrator que pueden ayudar a Chief Operating Officer los niveles sanguneos de [place], como [employer] [employer], mangos y batatas.  Productos lcteos.  Elija productos lcteos sin grasa o con bajo contenido de [place], como [place], yogur y [place].  Cereales, panes, pastas y arroz.  Elija cereales integrales, como panes multicereales, avena en grano y arroz integral. Estos alimentos pueden ayudar a controlar la presin arterial.  Rosalin Hawking.  Alimentos que contengan grasas saludables, como frutos secos, Chartered certified accountant, aceite de [place], aceite de canola y pescado. TODOS LOS QUE PADECEN DIABETES MELLITUS TIENEN EL MISMO PLAN DE COMIDAS? Dado que todas las personas que padecen diabetes mellitus son distintas, no hay un solo plan de comidas que funcione para todos. Es muy importante que se rena con un nutricionista que lo ayudar a crear un plan de comidas adecuado para usted. Esta informacin no tiene Theme park manager el consejo del mdico. Asegrese de hacerle al mdico cualquier pregunta que tenga. Document Released: 05/17/2007 Document Revised: 02/28/2014 Document Reviewed: 01/04/2013 Elsevier Interactive Patient Education  2017 [employer].   [place] (Fatigue) La fatiga es una sensacin de cansancio en todo momento, falta de energa o falta de motivacin. La fatiga ocasional o leve con frecuencia es una reaccin normal a la actividad o la vida en general. Sin embargo, la fatiga de Set designer duracin (crnica) o extrema puede indicar una enfermedad preexistente. INSTRUCCIONES PARA EL CUIDADO EN EL HOGAR Controle su fatiga para ver si hay cambios. Las siguientes indicaciones ayudarn a Psychologist, educational [employer]  pueda sentir:  Hable con el mdico acerca de cunto debe dormir cada noche. Trate de dormir la cantidad de tiempo requerida todas las noches.  Tome los medicamentos solamente como se lo haya indicado el mdico.  Siga una dieta saludable y nutritiva. Pida ayuda al mdico si necesita hacer cambios en su dieta.  Beba suficiente lquido para Photographer orina clara o de color amarillo plido.  Practique actividades que lo relajen, como yoga, meditacin, terapia de [place] o acupuntura.  Haga ejercicios regularmente.  Cambie las situaciones que le provocan estrs. Trate de que su [place] y personal sea moderada.  No consuma drogas.  Limite el consumo de alcohol a no ms de 1 medida por da si es mujer y no est Orthoptist, y 2 medidas si es hombre. Una medida equivale a 12onzas de cerveza, 5onzas de vino o 1onzas de bebidas alcohlicas de alta graduacin.  Tome una multivitamina, si se lo indic el mdico. SOLICITE ATENCIN MDICA SI:  La fatiga no mejora.  Tiene fiebre.  Tiene prdida o aumento involuntario de [place].  Tiene dolores de [place].  Tiene dificultad para:  Dormirse.  Dormir durante toda la noche.  Se siente enojado, culpable, ansioso o triste.  No puede defecar (estreimiento).  Tiene la piel seca.  Tiene hinchadas las piernas u otra parte del cuerpo. SOLICITE ATENCIN MDICA DE INMEDIATO SI:  Se siente confundido.  Tiene visin borrosa.  Sufre mareos o se desmaya.  Sufre un dolor intenso de [place].  Siente dolor intenso en el abdomen, la pelvis o la espalda.  Tiene dolor de pecho, dificultad para  respirar, o latidos cardacos irregulares o acelerados.  No puede orinar u [employer] de lo normal.  Presenta sangrado anormal, como sangrado del recto, la vagina, la nariz, los pulmones o los pezones.  Vomita sangre.  Tiene pensamientos acerca de hacerse dao a s mismo o cometer suicidio.  Le preocupa la posibilidad de hacerle dao a  otra persona. Esta informacin no tiene Marine scientist el consejo del mdico. Asegrese de hacerle al mdico cualquier pregunta que tenga. Document Released: 05/26/2008 Document Revised: 06/01/2015 Document Reviewed: 06/11/2013 Elsevier Interactive Patient Education  2017 [place] (Hypoglycemia) La hipoglucemia se produce cuando el nivel de azcar (glucosa) en la sangre es demasiado bajo. Los sntomas de la glucemia baja pueden incluir los siguientes:  Sentir que tiene lo siguiente:  Apetito.  Preocupacin o nervios (ansioso).  Sudoracin y piel hmeda.  Confusin.  Mareos.  Somnolencia.  Nuseas.  Tener lo siguiente:  Latidos cardacos acelerados.  Dolor de [place].  Cambios en la visin.  Una crisis de movimientos que no puede controlar (convulsiones).  Pesadillas.  Hormigueo y falta de sensibilidad (adormecimiento) alrededor de la boca, los labios o la [place].  Dificultades para hacer lo siguiente:  Hablar.  Prestar atencin (concentrarse).  Moverse (coordinacin).  Dormir.  Temblores.  Desmayos.  Molestarse con facilidad (irritabilidad). Las personas que tienen diabetes y las que no tienen la enfermedad pueden tener la glucemia baja. El nivel bajo de glucemia en la sangre puede ocurrir rpidamente y ser Engineer, maintenance (IT). Tratamiento de la glucemia baja  Generalmente, el tratamiento de la glucemia baja consiste en ingerir de inmediato un alimento o una bebida que contengan azcar. Si puede pensar con claridad y tragar de manera segura, siga la regla 15/15, que consiste en lo siguiente:  Consumir 15gramos de un hidrato de carbono de accin rpida. Algunos hidratos de carbono de accin rpida son los siguientes:  1pomo de glucosa en gel.  3comprimidos de azcar (comprimidos de glucosa).  6 a 8unidades de caramelos duros.  4onzas (155m) de jugo de frutas.  4onzas (1266m de gaseosa comn (no diettica).  Contrlese  la glucemia 15100mtos despus de ingerir el hidrato de carbono.  Si el nivel de glucosa en la sangre todava es igual o menor que '70mg'$ /dl (3,9mm86ml), ingiera nuevamente 15gramos de un hidrato de carbono.  Si el nivel de glucosa en la sangre no supera los '70mg'$ /dl (3,9mmo27m) despus de 3intentos, solicite ayuda de inmediato.  Ingiera una comida o una colacin en el transcurso de 1hora despus de que la glucemia se haya normalizado. Tratamiento de la glucemia muy baja  Si el nivel de glucosa en la sangre es igual o menor que '54mg'$ /dl (3mmol71m, significa que est muy bajo (hipoglucemia grave). Esto es una emEngineer, maintenance (IT)spere hasta que los sntomas desaparezcan. Solicite atencin mdica de inmediato. Comunquese con el servicio de emergencias de su localidad (911 en los [place]). No conduzca por sus propios medios hasta Principal Financialu nivel de glucosa en la sangre muy bajo y no puede ingerir ningn alimento ni bebida, tal vez deba aplicarse una inyeccin de glucagn. Un familiar o un amigo deben aprender a controlarle la glucemia y a aplicarle una inyeccin de glucagn. Pregntele al mdico si debe tener un kit de inyecciones de glucagn en su casa. CUIDADOS EN EL HOGAR Instrucciones generales  Evite cualquier dieta que le impida ingerir la cantidad suficiente de comida. Hable con el mdico antes de comenzar una dieta nueva.  Tome los medicamentos de venta  libre y los recetados solamente como se lo haya indicado el mdico.  Limite el consumo de alcohol a no ms de 10mdida por da si es mujer y no est embarazada y a 244midas por da si es hombre. Una medida equivale a 12onzas de cerveza, 5onzas de vino o 1onzas de bebidas alcohlicas de alta graduacin.  Concurra a todas las visitas de control como se lo haya indicado el mdico. Esto es importante. Si usted tiene diabetes:  Asegrese de coAssurante la hipoglucemia.  Siempre tenga a mano una fuente de  azcar, como por ejemplo:  Azcar.  Comprimidos de azcar.  Gel de glucosa.  Jugo de frutas.  Gaseosa comn (gaseosa que no sea diettica).  Leche.  Caramelos duros.  Miel.  Tome los medicamentos segn las indicaciones.  Siga el plan de ejercicios y de alimentacin.  Coma a horario. No omita comidas.  Siga el plan para los das de enfermedad cuando no pueda comer o beber normalmente. Arme este plan de antemano con el mdico.  Contrlese la glucemia con la frecuencia que le haya indicado el mdico. Contrlesela siempre antes y despus de hacer actividad fsica.  Comparta su plan de atencin de la diabetes con estas personas:  Compaeros de trabajo o de la escuela.  Aquellas con las que coOcta Hgase anlisis de orina para deProduct managerresencia de cetonas:  Cuando est enfermo.  Como se lo haya indicado el mdico.  Lleve consigo una tarjeta, o use un brazalete o una medalla que indiquen que es diabtico. Si tiene un nivel bajo de glucosa en la sangre debido a otras causas:  Contrlese la glucemia con la frecuencia que le haya indicado el mdico.  Siga las indicaciones del mdico respecto de lo que no puede comer o beber. SOLICITE AYUDA SI:  Tiene problemas para mantener el nivel de glucosa en la sangre dentro del rango indicado.  Tiene la glucemia baja con frecuencia. SOLICITE AYUDA DE INMEDIATO SI:  Contina teniendo sntomas despus de haber comido o ingerido algo con azcar.  La glucemia es igual o inferior a '54mg'$ /dl (82m6m/dl).  Tiene una crisis de movimientos que no pueIT consultantSe desmaya. Estos sntomas pueden indSales executiveo espere hasta que los sntomas desaparezcan. Solicite atencin mdica de inmediato. Comunquese con el servicio de emergencias de su localidad (911 en los [place]). No conduzca por sus propios medios hasPrincipal Financialsta informacin no tiene comMarine scientist consejo del mdico. Asegrese de  hacerle al mdico cualquier pregunta que tenga. Document Released: 03/12/2010 Document Revised: 06/01/2015 Document Reviewed: 03/13/2015 Elsevier Interactive Patient Education  2017 [employer]

## 2016-07-11 NOTE — Progress Notes (Signed)
Patient is here  For f/up  Patient has not eaten for today   Patient has not taking her medication for today

## 2016-07-12 LAB — CBC WITH DIFFERENTIAL/PLATELET
Basophils Absolute: 0 10*3/uL (ref 0.0–0.2)
Basos: 0 %
EOS (ABSOLUTE): 0.4 10*3/uL (ref 0.0–0.4)
EOS: 6 %
HEMATOCRIT: 40.1 % (ref 34.0–46.6)
Hemoglobin: 13.6 g/dL (ref 11.1–15.9)
IMMATURE GRANULOCYTES: 0 %
Immature Grans (Abs): 0 10*3/uL (ref 0.0–0.1)
LYMPHS ABS: 1.9 10*3/uL (ref 0.7–3.1)
Lymphs: 27 %
MCH: 30.2 pg (ref 26.6–33.0)
MCHC: 33.9 g/dL (ref 31.5–35.7)
MCV: 89 fL (ref 79–97)
MONOS ABS: 0.3 10*3/uL (ref 0.1–0.9)
Monocytes: 5 %
NEUTROS PCT: 62 %
Neutrophils Absolute: 4.5 10*3/uL (ref 1.4–7.0)
Platelets: 216 10*3/uL (ref 150–379)
RBC: 4.51 x10E6/uL (ref 3.77–5.28)
RDW: 13.5 % (ref 12.3–15.4)
WBC: 7.2 10*3/uL (ref 3.4–10.8)

## 2016-07-12 LAB — TSH: TSH: 0.615 u[IU]/mL (ref 0.450–4.500)

## 2016-07-12 LAB — BASIC METABOLIC PANEL
BUN/Creatinine Ratio: 19 (ref 9–23)
BUN: 14 mg/dL (ref 6–24)
CALCIUM: 9.9 mg/dL (ref 8.7–10.2)
CHLORIDE: 103 mmol/L (ref 96–106)
CO2: 19 mmol/L (ref 18–29)
Creatinine, Ser: 0.73 mg/dL (ref 0.57–1.00)
GFR, EST AFRICAN AMERICAN: 104 mL/min/{1.73_m2} (ref >59)
GFR, EST NON AFRICAN AMERICAN: 90 mL/min/{1.73_m2} (ref >59)
Glucose: 161 mg/dL — ABNORMAL HIGH (ref 65–99)
POTASSIUM: 4.6 mmol/L (ref 3.5–5.2)
SODIUM: 138 mmol/L (ref 134–144)

## 2016-07-15 ENCOUNTER — Other Ambulatory Visit: Payer: Self-pay | Admitting: Family Medicine

## 2016-07-15 DIAGNOSIS — E039 Hypothyroidism, unspecified: Secondary | ICD-10-CM

## 2016-07-15 MED ORDER — LEVOTHYROXINE SODIUM 112 MCG PO TABS: 112.0000 ug | ORAL_TABLET | Freq: Every day | ORAL | 0 refills | Status: DC

## 2016-07-19 ENCOUNTER — Telehealth: Payer: Self-pay | Admitting: Family Medicine

## 2016-07-19 NOTE — Telephone Encounter (Signed)
CMA cal regarding lab results  Patient Verify DOB  Patient was aware and understood

## 2016-07-19 NOTE — Telephone Encounter (Signed)
Pt calling to get lab results. Please f/u. Thank you.

## 2016-07-19 NOTE — Telephone Encounter (Signed)
-----   Message from Lizbeth BarkMandesia R Hairston, FNP sent at 07/15/2016 11:14 AM EDT ----- Thyroid levels are normal on current levothyroxine dosage. Will refill for 3 months supply. Recommend follow up in 3 months. Labs that evaluated your blood cells, fluid and electrolyte balance are normal. No signs of anemia, infection, or inflammation present. Kidney function normal Liver function normal

## 2016-07-25 ENCOUNTER — Ambulatory Visit (AMBULATORY_SURGERY_CENTER): Payer: Self-pay | Admitting: *Deleted

## 2016-07-25 VITALS — Ht 59.5 in | Wt 164.0 lb

## 2016-07-25 DIAGNOSIS — Z1211 Encounter for screening for malignant neoplasm of colon: Secondary | ICD-10-CM

## 2016-07-25 MED ORDER — NA SULFATE-K SULFATE-MG SULF 17.5-3.13-1.6 GM/177ML PO SOLN: 1.0000 | Freq: Once | ORAL | 0 refills | Status: AC

## 2016-07-25 NOTE — Progress Notes (Signed)
No egg or soy allergy known to patient  No past sedation with any surgeries  or procedures, no past intubation  No diet pills per patient No home 02 use per patient  No blood thinners per patient  Pt denies issues with constipation  No A fib or A flutter  EMMI video not sent as pt has no e Interior and spatial designermail Interpreter and daughter in [place] today - we discussed prep instructions several times with patient

## 2016-07-26 ENCOUNTER — Ambulatory Visit: Payer: Self-pay | Attending: Family Medicine | Admitting: Pharmacist

## 2016-07-26 ENCOUNTER — Ambulatory Visit: Payer: Self-pay | Admitting: Pharmacist

## 2016-07-26 DIAGNOSIS — E1165 Type 2 diabetes mellitus with hyperglycemia: Secondary | ICD-10-CM

## 2016-07-26 DIAGNOSIS — Z7984 Long term (current) use of oral hypoglycemic drugs: Secondary | ICD-10-CM | POA: Insufficient documentation

## 2016-07-26 DIAGNOSIS — E119 Type 2 diabetes mellitus without complications: Secondary | ICD-10-CM | POA: Insufficient documentation

## 2016-07-26 MED ORDER — GLIPIZIDE ER 5 MG PO TB24: 5.0000 mg | ORAL_TABLET | Freq: Every day | ORAL | 2 refills | Status: DC

## 2016-07-26 MED FILL — glipiZIDE XL 5 MG TB24: 5 | 30 days supply | Qty: 30 | Fill #0

## 2016-07-26 NOTE — Progress Notes (Addendum)
    S:     Chief Complaint  Patient presents with  . Medication Management    Patient arrives in good spirits.  Presents for diabetes evaluation, education, and management [facility]. Jegede. Patient was referred on 05/30/16.  Patient was last seen by Primary Care Provider on 07/11/16. Spanish interpreter Jearld LeschMelony [phone] was used for the entirety of the visit.  Patient reports adherence with medications.   Current diabetes medications include: glipizide 5 mg daily, metformin 1000 mg BID  Patient denies hypoglycemic events.   Patient reported dietary habits: no changes since last visit.  Patient reported exercise habits: walks   Patient denies nocturia.  Patient denies neuropathy. Patient denies visual changes. Patient reports self foot exams.   Patient reports that she will get a colonoscopy next week. She is scared and wants to know if she really needs it.   O:  Physical Exam   ROS   Lab Results  Component Value Date   HGBA1C 8.6 06/03/2016   There were no vitals filed for this visit.  Home fasting CBG: 110-180s 2 hour post-prandial/random CBG: none  A/P: Diabetes longstanding currently UNcontrolled based on A1c of 8.6 and home readings. Control has deteriorated from previous with switch to glipizide immediate release. Patient denies hypoglycemic events and is able to verbalize appropriate hypoglycemia management plan. Patient reports adherence with medication. Control is suboptimal due to lifestyle.  Change glipizide IR 5 mg to glipizide ER 5 mg for provide 24 hour coverage. Asked patient to get post-prandial readings again.   Educated patient on colonoscopy and its importance. Patient reports that she will get the colonoscopy next week.    Next A1C anticipated July 2018.    Written patient instructions provided.  Total time in face to face counseling 20 minutes.   Follow up in Pharmacist Clinic Visit PRN, [facility].    Patient seen with Dutch QuintKaley Whitesell, PharmD Candidate and Louis MatteJay Worthington, PharmD Candidate

## 2016-07-26 NOTE — Patient Instructions (Addendum)
Thanks for coming to see me  Start checking blood sugar 2 hours after you eat.  I have switched your glipizide to the one that lasts all day  Come back in 2 weeks    Hipoglucemia (Hypoglycemia) La hipoglucemia se produce cuando el nivel de azcar (glucosa) en la sangre es demasiado bajo. Los sntomas de la glucemia baja pueden incluir los siguientes:  Sentir que tiene lo siguiente: ? Apetito. ? Preocupacin o nervios (ansioso). ? Sudoracin y [employer]. ? Confusin. ? Mareos. ? Somnolencia. ? Nuseas.  Tener lo siguiente: ? Latidos cardacos acelerados. ? Dolor de [place]. ? Cambios en la visin. ? Una crisis de movimientos que no puede controlar (convulsiones). ? Pesadillas. ? Hormigueo y falta de sensibilidad (adormecimiento) alrededor de la boca, los labios o la [place].  Dificultades para hacer lo siguiente: ? Hablar. ? Prestar atencin (concentrarse). ? Moverse (coordinacin). ? Dormir.  Temblores.  Desmayos.  Molestarse con facilidad (irritabilidad). Las personas que tienen diabetes y las que no tienen la enfermedad pueden tener la glucemia baja. El nivel bajo de glucemia en la sangre puede ocurrir rpidamente y ser Engineer, maintenance (IT). Tratamiento de la glucemia baja Generalmente, el tratamiento de la glucemia baja consiste en ingerir de inmediato un alimento o una bebida que contengan azcar. Si puede pensar con claridad y tragar de manera segura, siga la regla 15/15, que consiste en lo siguiente:  Consumir 15gramos de un hidrato de carbono de accin rpida. Algunos hidratos de carbono de accin rpida son los siguientes: ? 1pomo de glucosa en gel. ? 3comprimidos de azcar (comprimidos de glucosa). ? 6 a 8unidades de caramelos duros. ? 4onzas (134m) de jugo de frutas. ? 4onzas (1295m de gaseosa comn (no diettica).  Contrlese la glucemia 1526mtos despus de ingerir el hidrato de carbono.  Si el nivel de glucosa en la sangre todava es igual o  menor que 18m96m (3,9mmo76m), ingiera nuevamente 15gramos de un hidrato de carbono.  Si el nivel de glucosa en la sangre no supera los 18mg/26m3,9mmol/21mdespus de 3intentos, solicite ayuda de inmediato.  Ingiera una comida o una colacin en el transcurso de 1hora despus de que la glucemia se haya normalizado. Tratamiento de la glucemia muy baja Si el nivel de glucosa en la sangre es igual o menor que 54mg/dl96mmol/l)24mignifica que est muy bajo (hipoglucemia grave). Esto es una emergEngineer, maintenance (IT)re hasta que los sntomas desaparezcan. Solicite atencin mdica de inmediato. Comunquese con el servicio de emergencias de su localidad (911 en los [place]). No conduzca por sus propios medios hasta el Principal Financialivel de glucosa en la sangre muy bajo y no puede ingerir ningn alimento ni bebida, tal vez deba aplicarse una inyeccin de glucagn. Un familiar o un amigo deben aprender a controlarle la glucemia y a aplicarle una inyeccin de glucagn. Pregntele al mdico si debe tener un kit de inyecciones de glucagn en su casa. CUIDADOS EN EL HOGAR Instrucciones generales  Evite cualquier dieta que le impida ingerir la cantidad suficiente de comida. Hable con el mdico antes de comenzar una dieta nueva.  Tome los medicamentos de venta libre y los recetados solamente como se lo haya indicado el mdico.  Limite el consumo de alcohol a no ms de 1medida p54mda si es mujer y no est embarazadaSan Marinodas p17mda si es hombre. Una medida equivale a 12onzas de cerveza, 5onzas de vino o 1onzas de bebidas alcohlicas de alta graduacin.  Concurra a todas las [place]  control como se lo haya indicado el mdico. Esto es importante. Si usted tiene diabetes:  Asegrese de [employer] de la hipoglucemia.  Siempre tenga a mano una fuente de azcar, como por ejemplo: ? Azcar. ? Comprimidos de azcar. ? Gel de glucosa. ? Jugo de frutas. ? Gaseosa comn (gaseosa  que no sea diettica). ? Leche. ? Caramelos duros. ? Miel.  Tome los medicamentos segn las indicaciones.  Siga el plan de ejercicios y de alimentacin. ? Coma a horario. No omita comidas. ? Siga el plan para los das de enfermedad cuando no pueda comer o beber normalmente. Arme este plan de antemano con el mdico.  Contrlese la glucemia con la frecuencia que le haya indicado el mdico. Contrlesela siempre antes y despus de hacer actividad fsica.  Comparta su plan de atencin de la diabetes con estas personas: ? Compaeros de trabajo o de la escuela. ? Aquellas con las que [place].  Hgase anlisis de orina para Product manager presencia de cetonas: ? Cuando est enfermo. ? Como se lo haya indicado el mdico.  Lleve consigo una tarjeta, o use un brazalete o una medalla que indiquen que es diabtico. Si tiene un nivel bajo de glucosa en la sangre debido a otras causas:  Contrlese la glucemia con la frecuencia que le haya indicado el mdico.  Siga las indicaciones del mdico respecto de lo que no puede comer o beber. SOLICITE AYUDA SI:  Tiene problemas para mantener el nivel de glucosa en la sangre dentro del rango indicado.  Tiene la glucemia baja con frecuencia. SOLICITE AYUDA DE INMEDIATO SI:  Contina teniendo sntomas despus de haber comido o ingerido algo con azcar.  La glucemia es igual o inferior a 62m/dl (321ml/dl).  Tiene una crisis de movimientos que no puIT consultant Se desmaya. Estos sntomas pueden inSales executiveNo espere hasta que los sntomas desaparezcan. Solicite atencin mdica de inmediato. Comunquese con el servicio de emergencias de su localidad (911 en los [place]). No conduzca por sus propios medios haPrincipal FinancialEsta informacin no tiene coMarine scientistl consejo del mdico. Asegrese de hacerle al mdico cualquier pregunta que tenga. Document Released: 03/12/2010 Document Revised: 06/01/2015 Document Reviewed:  03/13/2015 Elsevier Interactive Patient Education  20[employer]

## 2016-07-28 ENCOUNTER — Telehealth: Payer: Self-pay | Admitting: Internal Medicine

## 2016-07-28 NOTE — Telephone Encounter (Signed)
Called pt; she does not speak [place].  Called Vanessa back on 3rd floor to call pt and tell her we have Suprep Samples. Angela/PV

## 2016-08-02 ENCOUNTER — Ambulatory Visit (AMBULATORY_SURGERY_CENTER): Payer: Self-pay | Admitting: Internal Medicine

## 2016-08-02 ENCOUNTER — Encounter: Payer: Self-pay | Admitting: Internal Medicine

## 2016-08-02 VITALS — BP 114/59 | HR 65 | Temp 97.5°F | Resp 15 | Ht 60.0 in | Wt 161.0 lb

## 2016-08-02 DIAGNOSIS — Z1212 Encounter for screening for malignant neoplasm of rectum: Secondary | ICD-10-CM

## 2016-08-02 DIAGNOSIS — Z1211 Encounter for screening for malignant neoplasm of colon: Secondary | ICD-10-CM

## 2016-08-02 MED ORDER — SODIUM CHLORIDE 0.9 % IV SOLN: 500.0000 mL | INTRAVENOUS | Status: DC

## 2016-08-02 NOTE — Op Note (Signed)
[facility] Patient Name: Erin Huff Procedure Date: 08/02/2016 8:10 AM MRN: [id number] Endoscopist: Beverley FiedlerJay M Eliga Arvie , MD Age: 61 Referring MD:  Date of Birth: 1955/09/22 Gender: Female Account #: [id number] Procedure:                Colonoscopy Indications:              Screening for colorectal malignant neoplasm, This                            is the patient's first colonoscopy Medicines:                Monitored Anesthesia Care Procedure:                Pre-Anesthesia Assessment:                           - Prior to the procedure, a History and Physical                            was performed, and patient medications and                            allergies were reviewed. The patient's tolerance of                            previous anesthesia was also reviewed. The risks                            and benefits of the procedure and the sedation                            options and risks were discussed with the patient.                            All questions were answered, and informed consent                            was obtained. Prior Anticoagulants: The patient has                            taken no previous anticoagulant or antiplatelet                            agents. ASA Grade Assessment: II - A patient with                            mild systemic disease. After reviewing the risks                            and benefits, the patient was deemed in                            satisfactory condition to undergo the procedure.  After obtaining informed consent, the colonoscope                            was passed under direct vision. Throughout the                            procedure, the patient's blood pressure, pulse, and                            oxygen saturations were monitored continuously. The                            Colonoscope was introduced through the anus and                            advanced to the  the cecum, identified by                            appendiceal orifice and ileocecal valve. The                            colonoscopy was performed without difficulty. The                            patient tolerated the procedure well. The quality                            of the bowel preparation was excellent. The                            ileocecal valve, appendiceal orifice, and rectum                            were photographed. Scope In: 8:16:32 AM Scope Out: 8:28:04 AM Scope Withdrawal Time: 0 hours 9 minutes 20 seconds  Total Procedure Duration: 0 hours 11 minutes 32 seconds  Findings:                 The perianal and digital rectal examinations were                            normal.                           The entire examined colon appeared normal on direct                            and retroflexion views. Complications:            No immediate complications. Estimated Blood Loss:     Estimated blood loss: none. Impression:               - The entire examined colon is normal on direct and                            retroflexion views.                           -  No specimens collected. Recommendation:           - Patient has a contact number available for                            emergencies. The signs and symptoms of potential                            delayed complications were discussed with the                            patient. Return to normal activities tomorrow.                            Written discharge instructions were provided to the                            patient.                           - Resume previous diet.                           - Continue present medications.                           - Repeat colonoscopy in 10 years for screening                            purposes. Beverley Fiedler, MD 08/02/2016 8:30:30 AM This report has been signed electronically.

## 2016-08-02 NOTE — Progress Notes (Signed)
Spontaneous respirations throughout. VSS. Resting comfortably. To PACU on room air. Report to  Michelle RN. 

## 2016-08-02 NOTE — Patient Instructions (Signed)
YOU HAD AN ENDOSCOPIC PROCEDURE TODAY AT THE [facility]:   Refer to the procedure report that was given to you for any specific questions about what was found during the examination.  If the procedure report does not answer your questions, please call your gastroenterologist to clarify.  If you requested that your care partner not be given the details of your procedure findings, then the procedure report has been included in a sealed envelope for you to review at your convenience later.  YOU SHOULD EXPECT: Some feelings of bloating in the abdomen. Passage of more gas than usual.  Walking can help get rid of the air that was put into your GI tract during the procedure and reduce the bloating. If you had a lower endoscopy (such as a colonoscopy or flexible sigmoidoscopy) you may notice spotting of blood in your stool or on the toilet paper. If you underwent a bowel prep for your procedure, you may not have a normal bowel movement for a few days.  Please Note:  You might notice some irritation and congestion in your nose or some drainage.  This is from the oxygen used during your procedure.  There is no need for concern and it should clear up in a day or so.  SYMPTOMS TO REPORT IMMEDIATELY:   Following lower endoscopy (colonoscopy or flexible sigmoidoscopy):  Excessive amounts of blood in the stool  Significant tenderness or worsening of abdominal pains  Swelling of the abdomen that is new, acute  Fever of 100F or higher  For urgent or emergent issues, a gastroenterologist can be reached at any hour by calling [phone].   DIET:  We do recommend a small meal at first, but then you may proceed to your regular diet.  Drink plenty of fluids but you should avoid alcoholic beverages for 24 hours.  ACTIVITY:  You should plan to take it easy for the rest of today and you should NOT DRIVE or use heavy machinery until tomorrow (because of the sedation medicines used during the test).     FOLLOW UP: Our staff will call the number listed on your records the next business day following your procedure to check on you and address any questions or concerns that you may have regarding the information given to you following your procedure. If we do not reach you, we will leave a message.  However, if you are feeling well and you are not experiencing any problems, there is no need to return our call.  We will assume that you have returned to your regular daily activities without incident.  If any biopsies were taken you will be contacted by phone or by letter within the next 1-3 weeks.  Please call us at [phone] if you have not heard about the biopsies in 3 weeks.   Repeat Colonoscopy screening in 10 years.   SIGNATURES/CONFIDENTIALITY: You and/or your care partner have signed paperwork which will be entered into your electronic medical record.  These signatures attest to the fact that that the information above on your After Visit Summary has been reviewed and is understood.  Full responsibility of the confidentiality of this discharge information lies with you and/or your care-partner. 

## 2016-08-02 NOTE — Progress Notes (Signed)
Pt's states no medical or surgical changes since previsit or office visit. 

## 2016-08-03 ENCOUNTER — Telehealth: Payer: Self-pay | Admitting: *Deleted

## 2016-08-03 ENCOUNTER — Telehealth: Payer: Self-pay

## 2016-08-03 NOTE — Telephone Encounter (Signed)
Left message on answering machine. 

## 2016-08-03 NOTE — Telephone Encounter (Signed)
Second attempt to contact this patient for follow up. Person answering the telephone stating she did not speak [place]. Unable to speak with the patient.

## 2016-08-11 ENCOUNTER — Ambulatory Visit: Payer: Self-pay | Attending: Family Medicine | Admitting: Pharmacist

## 2016-08-11 DIAGNOSIS — E1165 Type 2 diabetes mellitus with hyperglycemia: Secondary | ICD-10-CM

## 2016-08-11 DIAGNOSIS — J069 Acute upper respiratory infection, unspecified: Secondary | ICD-10-CM

## 2016-08-11 DIAGNOSIS — Z7984 Long term (current) use of oral hypoglycemic drugs: Secondary | ICD-10-CM | POA: Insufficient documentation

## 2016-08-11 DIAGNOSIS — E039 Hypothyroidism, unspecified: Secondary | ICD-10-CM

## 2016-08-11 DIAGNOSIS — E119 Type 2 diabetes mellitus without complications: Secondary | ICD-10-CM | POA: Insufficient documentation

## 2016-08-11 MED ORDER — FLUTICASONE PROPIONATE 50 MCG/ACT NA SUSP
2.0000 | Freq: Every day | NASAL | 2 refills | Status: DC
Start: 2016-08-11 — End: 2016-08-16

## 2016-08-11 MED ORDER — GLIPIZIDE ER 5 MG PO TB24: 5.0000 mg | ORAL_TABLET | Freq: Every day | ORAL | 2 refills | Status: DC

## 2016-08-11 MED ORDER — LEVOTHYROXINE SODIUM 112 MCG PO TABS: 112.0000 ug | ORAL_TABLET | Freq: Every day | ORAL | 2 refills | Status: DC

## 2016-08-11 MED ORDER — LISINOPRIL 30 MG PO TABS: 30.0000 mg | ORAL_TABLET | Freq: Every day | ORAL | 2 refills | Status: DC

## 2016-08-11 MED ORDER — METFORMIN HCL 1000 MG PO TABS: 1000.0000 mg | ORAL_TABLET | Freq: Two times a day (BID) | ORAL | 2 refills | Status: DC

## 2016-08-11 MED ORDER — CETIRIZINE HCL 10 MG PO TABS: 10.0000 mg | ORAL_TABLET | Freq: Every day | ORAL | 2 refills | Status: DC

## 2016-08-11 NOTE — Patient Instructions (Addendum)
Thanks for coming to see me  Follow up with Fredia Beets in 1 month   Hipoglucemia (Hypoglycemia) La hipoglucemia se produce cuando el nivel de azcar (glucosa) en la sangre es demasiado bajo. Los sntomas de la glucemia baja pueden incluir los siguientes:  Sentir que tiene lo siguiente: ? Apetito. ? Preocupacin o nervios (ansioso). ? Sudoracin y [employer]. ? Confusin. ? Mareos. ? Somnolencia. ? Nuseas.  Tener lo siguiente: ? Latidos cardacos acelerados. ? Dolor de [place]. ? Cambios en la visin. ? Una crisis de movimientos que no puede controlar (convulsiones). ? Pesadillas. ? Hormigueo y falta de sensibilidad (adormecimiento) alrededor de la boca, los labios o la [place].  Dificultades para hacer lo siguiente: ? Hablar. ? Prestar atencin (concentrarse). ? Moverse (coordinacin). ? Dormir.  Temblores.  Desmayos.  Molestarse con facilidad (irritabilidad). Las personas que tienen diabetes y las que no tienen la enfermedad pueden tener la glucemia baja. El nivel bajo de glucemia en la sangre puede ocurrir rpidamente y ser Engineer, maintenance (IT). Tratamiento de la glucemia baja Generalmente, el tratamiento de la glucemia baja consiste en ingerir de inmediato un alimento o una bebida que contengan azcar. Si puede pensar con claridad y tragar de manera segura, siga la regla 15/15, que consiste en lo siguiente:  Consumir 15gramos de un hidrato de carbono de accin rpida. Algunos hidratos de carbono de accin rpida son los siguientes: ? 1pomo de glucosa en gel. ? 3comprimidos de azcar (comprimidos de glucosa). ? 6 a 8unidades de caramelos duros. ? 4onzas (12m) de jugo de frutas. ? 4onzas (1272m de gaseosa comn (no diettica).  Contrlese la glucemia 1534mtos despus de ingerir el hidrato de carbono.  Si el nivel de glucosa en la sangre todava es igual o menor que 87m58m (3,9mmo38m), ingiera nuevamente 15gramos de un hidrato de carbono.  Si el  nivel de glucosa en la sangre no supera los 87mg/36m3,9mmol/27mdespus de 3intentos, solicite ayuda de inmediato.  Ingiera una comida o una colacin en el transcurso de 1hora despus de que la glucemia se haya normalizado. Tratamiento de la glucemia muy baja Si el nivel de glucosa en la sangre es igual o menor que 54mg/dl81mmol/l)35mignifica que est muy bajo (hipoglucemia grave). Esto es una emergEngineer, maintenance (IT)re hasta que los sntomas desaparezcan. Solicite atencin mdica de inmediato. Comunquese con el servicio de emergencias de su localidad (911 en los [place]). No conduzca por sus propios medios hasta el Principal Financialivel de glucosa en la sangre muy bajo y no puede ingerir ningn alimento ni bebida, tal vez deba aplicarse una inyeccin de glucagn. Un familiar o un amigo deben aprender a controlarle la glucemia y a aplicarle una inyeccin de glucagn. Pregntele al mdico si debe tener un kit de inyecciones de glucagn en su casa. CUIDADOS EN EL HOGAR Instrucciones generales  Evite cualquier dieta que le impida ingerir la cantidad suficiente de comida. Hable con el mdico antes de comenzar una dieta nueva.  Tome los medicamentos de venta libre y los recetados solamente como se lo haya indicado el mdico.  Limite el consumo de alcohol a no ms de 1medida p32mda si es mujer y no est embarazadaSan Marinodas p13mda si es hombre. Una medida equivale a 12onzas de cerveza, 5onzas de vino o 1onzas de bebidas alcohlicas de alta graduacin.  Concurra a todas las visitas de control como se lo haya indicado el mdico. Esto es importante. Si usted tiene diabetes:  Asegrese de conocer losCharity fundraiser  de la hipoglucemia.  Siempre tenga a mano una fuente de azcar, como por ejemplo: ? Azcar. ? Comprimidos de azcar. ? Gel de glucosa. ? Jugo de frutas. ? Gaseosa comn (gaseosa que no sea diettica). ? Leche. ? Caramelos duros. ? Miel.  Tome los medicamentos segn las  indicaciones.  Siga el plan de ejercicios y de alimentacin. ? Coma a horario. No omita comidas. ? Siga el plan para los das de enfermedad cuando no pueda comer o beber normalmente. Arme este plan de antemano con el mdico.  Contrlese la glucemia con la frecuencia que le haya indicado el mdico. Contrlesela siempre antes y despus de hacer actividad fsica.  Comparta su plan de atencin de la diabetes con estas personas: ? Compaeros de trabajo o de la escuela. ? Aquellas con las que [place].  Hgase anlisis de orina para Product manager presencia de cetonas: ? Cuando est enfermo. ? Como se lo haya indicado el mdico.  Lleve consigo una tarjeta, o use un brazalete o una medalla que indiquen que es diabtico. Si tiene un nivel bajo de glucosa en la sangre debido a otras causas:  Contrlese la glucemia con la frecuencia que le haya indicado el mdico.  Siga las indicaciones del mdico respecto de lo que no puede comer o beber. SOLICITE AYUDA SI:  Tiene problemas para mantener el nivel de glucosa en la sangre dentro del rango indicado.  Tiene la glucemia baja con frecuencia. SOLICITE AYUDA DE INMEDIATO SI:  Contina teniendo sntomas despus de haber comido o ingerido algo con azcar.  La glucemia es igual o inferior a 66m/dl (326ml/dl).  Tiene una crisis de movimientos que no puIT consultant Se desmaya. Estos sntomas pueden inSales executiveNo espere hasta que los sntomas desaparezcan. Solicite atencin mdica de inmediato. Comunquese con el servicio de emergencias de su localidad (911 en los [place]). No conduzca por sus propios medios haPrincipal FinancialEsta informacin no tiene coMarine scientistl consejo del mdico. Asegrese de hacerle al mdico cualquier pregunta que tenga. Document Released: 03/12/2010 Document Revised: 06/01/2015 Document Reviewed: 03/13/2015 Elsevier Interactive Patient Education  20[employer]

## 2016-08-11 NOTE — Progress Notes (Signed)
    S:     No chief complaint on file.   Patient arrives in good spirits.  Presents for diabetes evaluation, education, and management at the request of Erin Huff/Dr. Jegede. Patient was referred on 05/30/16.  Patient was last seen by Primary Care Provider on 07/11/16. Spanish interpreter Erin Huff [phone] was used for the entirety of the visit.  Patient reports adherence with medications.   Current diabetes medications include: glipizide XL 5 mg daily, metformin 1000 mg BID  Patient denies hypoglycemic events.   Patient reported dietary habits: no changes since last visit.  Patient reported exercise habits: walks   Patient denies nocturia.  Patient denies neuropathy. Patient denies visual changes. Patient reports self foot exams.   She reports that she had her colonoscopy and the results were good.   [facility]   O:  Physical Exam   ROS   Lab Results  Component Value Date   HGBA1C 8.6 06/03/2016   There were no vitals filed for this visit.  Home fasting CBG: 90s-150s (most <130) 2 hour post-prandial/random CBG: none  A/P: Diabetes longstanding currently UNcontrolled based on A1c of 8.6 but improving based on home readings. Patient denies hypoglycemic events and is able to verbalize appropriate hypoglycemia management plan. Patient reports adherence with medication. Control is suboptimal due to lifestyle.  Continue current medications as prescribed. Encouraged patient to try to get some post-prandial readings though I know cost is a barrier. She will at least continue to get daily fastings and check it if she ever feels low. Will send current chronic medications to [facility].   Next A1C anticipated July 2018.    Written patient instructions provided.  Total time in face to face counseling 20 minutes.   Follow up in Pharmacist Clinic Visit PRN, next visit with Erin Huff in 4 weeks..Marland Kitchen

## 2016-08-16 ENCOUNTER — Other Ambulatory Visit: Payer: Self-pay | Admitting: Pharmacist

## 2016-08-16 DIAGNOSIS — E039 Hypothyroidism, unspecified: Secondary | ICD-10-CM

## 2016-08-16 DIAGNOSIS — J069 Acute upper respiratory infection, unspecified: Secondary | ICD-10-CM

## 2016-08-16 DIAGNOSIS — E1165 Type 2 diabetes mellitus with hyperglycemia: Secondary | ICD-10-CM

## 2016-08-16 MED ORDER — GLIPIZIDE ER 5 MG PO TB24: 5.0000 mg | ORAL_TABLET | Freq: Every day | ORAL | 2 refills | Status: DC

## 2016-08-16 MED ORDER — LISINOPRIL 30 MG PO TABS: 30.0000 mg | ORAL_TABLET | Freq: Every day | ORAL | 2 refills | Status: DC

## 2016-08-16 MED ORDER — METFORMIN HCL 1000 MG PO TABS: 1000.0000 mg | ORAL_TABLET | Freq: Two times a day (BID) | ORAL | 2 refills | Status: DC

## 2016-08-16 MED ORDER — LEVOTHYROXINE SODIUM 112 MCG PO TABS: 112.0000 ug | ORAL_TABLET | Freq: Every day | ORAL | 2 refills | Status: DC

## 2016-08-16 MED ORDER — CETIRIZINE HCL 10 MG PO TABS: 10.0000 mg | ORAL_TABLET | Freq: Every day | ORAL | 2 refills | Status: DC

## 2016-08-16 MED ORDER — FLUTICASONE PROPIONATE 50 MCG/ACT NA SUSP: 2.0000 | Freq: Every day | NASAL | 2 refills | Status: DC

## 2016-08-23 ENCOUNTER — Ambulatory Visit: Payer: Self-pay | Attending: Family Medicine

## 2016-08-26 ENCOUNTER — Encounter: Payer: Self-pay | Admitting: Family Medicine

## 2016-08-26 ENCOUNTER — Ambulatory Visit: Payer: Self-pay | Attending: Family Medicine | Admitting: Family Medicine

## 2016-08-26 VITALS — BP 127/76 | HR 68 | Temp 98.4°F | Resp 18 | Ht 60.0 in | Wt 166.2 lb

## 2016-08-26 DIAGNOSIS — Z7982 Long term (current) use of aspirin: Secondary | ICD-10-CM | POA: Insufficient documentation

## 2016-08-26 DIAGNOSIS — E785 Hyperlipidemia, unspecified: Secondary | ICD-10-CM | POA: Insufficient documentation

## 2016-08-26 DIAGNOSIS — I10 Essential (primary) hypertension: Secondary | ICD-10-CM | POA: Insufficient documentation

## 2016-08-26 DIAGNOSIS — Z23 Encounter for immunization: Secondary | ICD-10-CM | POA: Insufficient documentation

## 2016-08-26 DIAGNOSIS — E119 Type 2 diabetes mellitus without complications: Secondary | ICD-10-CM | POA: Insufficient documentation

## 2016-08-26 DIAGNOSIS — Z7984 Long term (current) use of oral hypoglycemic drugs: Secondary | ICD-10-CM | POA: Insufficient documentation

## 2016-08-26 DIAGNOSIS — Z0000 Encounter for general adult medical examination without abnormal findings: Secondary | ICD-10-CM

## 2016-08-26 LAB — GLUCOSE, POCT (MANUAL RESULT ENTRY): POC Glucose: 154 mg/dl — AB (ref 70–99)

## 2016-08-26 LAB — POCT GLYCOSYLATED HEMOGLOBIN (HGB A1C): HEMOGLOBIN A1C: 7.2

## 2016-08-26 MED ORDER — PNEUMOCOCCAL 13-VAL CONJ VACC IM SUSP
0.5000 mL | Freq: Once | INTRAMUSCULAR | Status: AC
  Administered 2016-08-26: 0.5 mL via INTRAMUSCULAR

## 2016-08-26 NOTE — Patient Instructions (Addendum)
Apply for orange card.   La diabetes mellitus y los alimentos (Diabetes Mellitus and Food) Es importante que controle su nivel de azcar en la sangre (glucosa). El nivel de glucosa en sangre depende en gran medida de lo que usted come. Comer alimentos saludables en las cantidades Panamaadecuadas a lo largo del Futures traderda, aproximadamente a la misma hora CarMaxtodos los das, lo ayudar a Chief Operating Officercontrolar su nivel de Event organiserglucosa en sangre. Tambin puede ayudarlo a retrasar o Fish farm managerevitar el empeoramiento de la diabetes mellitus. Comer de Regions Financial Corporationmanera saludable incluso puede ayudarlo a Event organisermejorar el nivel de presin arterial y a Baristaalcanzar o Pharmacologistmantener un peso saludable. Entre las recomendaciones generales para alimentarse y Water quality scientistcocinar los alimentos de forma saludable, se incluyen las siguientes:  Respetar las comidas principales y comer colaciones con regularidad. Evitar pasar largos perodos sin comer con el fin de perder peso.  Seguir una dieta que consista principalmente en alimentos de origen vegetal, como frutas, vegetales, frutos secos, legumbres y cereales integrales.  Utilizar mtodos de coccin a baja temperatura, como hornear, en lugar de mtodos de coccin a alta temperatura, como frer en abundante aceite. Trabaje con el nutricionista para aprender a Acupuncturistusar la informacin nutricional de las etiquetas de los alimentos. CMO PUEDEN AFECTARME LOS ALIMENTOS? Carbohidratos Los carbohidratos afectan el nivel de glucosa en sangre ms que cualquier otro tipo de alimento. El nutricionista lo ayudar a Chief Strategy Officerdeterminar cuntos carbohidratos puede consumir en cada comida y ensearle a contarlos. El recuento de carbohidratos es importante para mantener la glucosa en sangre en un nivel saludable, en especial si utiliza insulina o toma determinados medicamentos para la diabetes mellitus. Alcohol El alcohol puede provocar disminuciones sbitas de la glucosa en sangre (hipoglucemia), en especial si utiliza insulina o toma determinados medicamentos para la  diabetes mellitus. La hipoglucemia es una afeccin que puede poner en peligro la vida. Los sntomas de la hipoglucemia (somnolencia, mareos y Administratordesorientacin) son similares a los sntomas de haber consumido mucho alcohol. Si el mdico lo autoriza a beber alcohol, hgalo con moderacin y siga estas pautas:  Las mujeres no deben beber ms de un trago por da, y los hombres no deben beber ms de dos tragos por Futures traderda. Un trago es igual a: ? 12 onzas (355 ml) de cerveza ? 5 onzas de vino (150 ml) de vino ? 1,5onzas (45ml) de bebidas espirituosas  No beba con el estmago vaco.  Mantngase hidratado. Beba agua, gaseosas dietticas o t helado sin azcar.  Las gaseosas comunes, los jugos y otros refrescos podran contener muchos carbohidratos y se Heritage managerdeben contar. QU ALIMENTOS NO SE RECOMIENDAN? Cuando haga las elecciones de alimentos, es importante que recuerde que todos los alimentos son distintos. Algunos tienen menos nutrientes que otros por porcin, aunque podran tener la misma cantidad de caloras o carbohidratos. Es difcil darle al cuerpo lo que necesita cuando consume alimentos con menos nutrientes. Estos son algunos ejemplos de alimentos que debera evitar ya que contienen muchas caloras y carbohidratos, pero pocos nutrientes:  NeurosurgeonGrasas trans (la mayora de los alimentos procesados incluyen grasas trans en la etiqueta de Informacin nutricional).  Gaseosas comunes.  Jugos.  Caramelos.  Dulces, como tortas, pasteles, rosquillas y Alverdagalletas.  Comidas fritas. QU ALIMENTOS PUEDO COMER? Consuma alimentos ricos en nutrientes, que nutrirn el cuerpo y lo mantendrn saludable. Los alimentos que debe comer tambin dependern de varios factores, como:  Las caloras que necesita.  Los medicamentos que toma.  Su peso.  El nivel de glucosa en [place].  El nivel de presin arterial.  El nivel de [place]. Debe consumir una amplia variedad de alimentos, por  ejemplo:  Protenas. ? Cortes de [employer]. ? Protenas con bajo contenido de grasas saturadas, como pescado, clara de huevo y frijoles. Evite las carnes procesadas.  Frutas y vegetales. ? [place] y Sports administrator que pueden ayudar a [employer] niveles sanguneos de [place], como [place], mangos y batatas.  Productos lcteos. ? Elija productos lcteos sin grasa o con bajo contenido de [place], como [place], yogur y [place].  Cereales, panes, pastas y arroz. ? Elija cereales integrales, como panes multicereales, avena en grano y arroz integral. Estos alimentos pueden ayudar a controlar la presin arterial.  Rosalin Hawking. ? Alimentos que contengan grasas saludables, como frutos secos, Chartered certified accountant, aceite de [place], aceite de canola y pescado. TODOS LOS QUE PADECEN DIABETES MELLITUS TIENEN EL MISMO PLAN DE COMIDAS? Dado que todas las personas que padecen diabetes mellitus son distintas, no hay un solo plan de comidas que funcione para todos. Es muy importante que se rena con un nutricionista que lo ayudar a crear un plan de comidas adecuado para usted. Esta informacin no tiene Theme park manager el consejo del mdico. Asegrese de hacerle al mdico cualquier pregunta que tenga. Document Released: 05/17/2007 Document Revised: 02/28/2014 Document Reviewed: 01/04/2013 Elsevier Interactive Patient Education  2017 Elsevier Inc.   Diabetes mellitus y actividad fsica (Diabetes Mellitus and Exercise) Hacer actividad fsica habitualmente es importante para el estado de salud general, en especial si tiene diabetes (diabetes mellitus). La actividad fsica no solo se reduce a Psychiatric nurse. Aporta muchos beneficios para la salud, como aumentar la fuerza muscular y la densidad sea, y reducir las grasas corporales y Development worker, community. Esto mejora el estado fsico, la flexibilidad y la resistencia, y todo ello redunda en un mejor estado de salud general. La actividad fsica tiene beneficios adicionales para los  diabticos, entre ellos:  Disminuye el apetito.  Ayuda a bajar y Photographer glucemia bajo control.  [place]aja la presin arterial.  Ayuda a controlar las cantidades de sustancias grasas (lpidos) en la [place], como el colesterol y los triglicridos.  Mejora la respuesta del cuerpo a la insulina (optimizacin de la sensibilidad a la insulina).  Reduce la cantidad de insulina que el cuerpo necesita.  Reduce el riesgo de sufrir cardiopata coronaria de la siguiente forma: ? [employer] de colesterol y triglicridos. ? Aumenta los niveles de colesterol bueno. ? Disminuye la glucemia. QU ES EL PLAN DE ACTIVIDADES? El mdico o un educador para la diabetes certificado pueden ayudarlo a Teacher, English as a foreign language del tipo y de la frecuencia de actividad fsica (plan de actividades) adecuado para usted. Asegrese de lo siguiente:  Haga por lo menos semanales de ejercicios de intensidad moderada o vigorosa. Estos podran ser caminatas dinmicas, ciclismo o [place]. ? Haga ejercicios de elongacin y de fortalecimiento, como yoga o levantamiento de pesas, por lo menos 2veces por semana. ? Reparta la actividad en al menos 3das de la semana.  Haga algn tipo de actividad fsica [employer]. ? No deje pasar ms de 2das seguidos sin hacer algn tipo de actividad fsica. ? No permanezca inactivo durante ms de seguidos. Tmese descansos frecuentes para caminar o estirarse.  Elija un tipo de ejercicio o de actividad que disfrute y establezca objetivos realistas.  Comience lentamente y aumente de [place] gradual la intensidad del ejercicio con el correr del [place]. QU DEBO SABER ACERCA DEL CONTROL DE LA DIABETES?  Contrlese la glucemia antes y despus de  ejercitarse. ? Si la glucemia supera los 240mg /dl (16,1WRUE/A) antes de ejercitarse, hgase un control de la orina para detectar la presencia de cetonas. Si tiene [employer] orina, no se ejercite  hasta que la glucemia se normalice.  Conozca los sntomas de la glucemia baja (hipoglucemia) y aprenda cmo tratarla. El riesgo de tener hipoglucemia [place] durante y despus de hacer actividad fsica. Los sntomas frecuentes de hipoglucemia pueden incluir los siguientes: ? Hambre. ? Ansiedad. ? Sudoracin y [employer]. ? Confusin. ? Mareos o sensacin de desvanecimiento. ? Aumento de la frecuencia cardaca o palpitaciones. ? Visin borrosa. ? Hormigueo o adormecimiento alrededor Public [employer] Enterprise [employer], los labios o la [place]. ? Temblores o sacudidas. ? Irritabilidad.  Tenga una colacin de carbohidratos de accin rpida disponible antes, durante y despus de ejercitarse, a fin de evitar o tratar la hipoglucemia.  Evite inyectarse insulina en las zonas del cuerpo que ejercitar. Por ejemplo, evite inyectarse insulina en: ? Los brazos, si juega al tenis. ? Las piernas, si corre.  Lleve registros de sus hbitos de actividad fsica. Esto puede ayudarlos a usted y al mdico a Retail banker de control de la diabetes segn sea necesario. Escriba los siguientes datos: ? Los alimentos que consume antes y despus de Radio producer actividad fsica. ? Los niveles de glucosa en la sangre antes y despus de hacer ejercicios. ? El tipo y cantidad de [place] fsica que Biomedical engineer. ? Cuando se prev que la insulina alcance su valor mximo, si [place] insulina. No haga actividad fsica en los momentos en que insulina alcanza su valor mximo.  Cuando comience un ejercicio o una actividad nuevos, trabaje con el mdico para asegurarse de que la actividad sea segura para usted y para Academic librarian la [place], los medicamentos o la ingesta de alimentos segn sea necesario.  Beba gran cantidad de agua mientras hace ejercicios para evitar la deshidratacin o los golpes de Airline pilot. Beba suficiente lquido para Photographer orina clara o de color amarillo plido. Esta informacin no tiene Theme park manager el consejo del mdico. Asegrese  de hacerle al mdico cualquier pregunta que tenga. Document Released: 02/27/2007 Document Revised: 06/01/2015 Document Reviewed: 07/20/2015 Elsevier Interactive Patient Education  [employer].

## 2016-08-26 NOTE — Progress Notes (Signed)
Subjective:  Patient ID: Erin Huff, female    DOB: 1955/07/13  Age: 61 y.o. MRN: [id number]  CC: Diabetes and Hypertension   HPI Erin Huff presents for follow up for DM and HTN. History of DM. Symptoms: none. Patient denies foot ulcerations, nausea, paresthesia of the feet, visual disturbances and vomitting.  Evaluation to date has been included: fasting blood sugar, fasting lipid panel, hemoglobin A1C and microalbuminuria.  Home sugars: CBG ranging 99- 200. Treatment to date: Continued sulfonylurea and metformin that has been effective. History of hypertension. She is not exercising and is not adherent to low salt diet. She does not check BP at home. Cardiac symptoms none. Patient denies chest pain, chest pressure/discomfort, claudication, lower extremity edema, near-syncope, palpitations and syncope.  Cardiovascular risk factors: diabetes mellitus, dyslipidemia, hypertension and sedentary lifestyle. Use of agents associated with hypertension: none. History of target organ damage: none.   Outpatient Medications Prior to Visit  Medication Sig Dispense Refill  . aspirin EC 81 MG tablet Take 1 tablet (81 mg total) by mouth daily. 30 tablet 11  . Blood Glucose Monitoring Suppl (TRUE METRIX METER) w/Device KIT Use as directed 1 kit 0  . cetirizine (ZYRTEC) 10 MG tablet Take 1 tablet (10 mg total) by mouth daily. 30 tablet 2  . fluticasone (FLONASE) 50 MCG/ACT nasal spray Place 2 sprays into both nostrils daily. 16 g 2  . glipiZIDE (GLUCOTROL XL) 5 MG 24 hr tablet Take 1 tablet (5 mg total) by mouth daily with breakfast. 30 tablet 2  . glucose blood (TRUE METRIX BLOOD GLUCOSE TEST) test strip Use as instructed 100 each 12  . levothyroxine (SYNTHROID, LEVOTHROID) 112 MCG tablet Take 1 tablet (112 mcg total) by mouth daily before breakfast. 30 tablet 2  . lisinopril (PRINIVIL,ZESTRIL) 30 MG tablet Take 1 tablet (30 mg total) by mouth daily. 30 tablet 2  . metFORMIN  (GLUCOPHAGE) 1000 MG tablet Take 1 tablet (1,000 mg total) by mouth 2 (two) times daily with a meal. 60 tablet 2  . Multiple Vitamin (MULTIVITAMIN) tablet Take 1 tablet by mouth daily.    Marland Kitchen OVER THE COUNTER MEDICATION nutrilite carb blocker as needed- helps with constipation    . TRUEPLUS LANCETS 28G MISC Use as directed 100 each 5   Facility-Administered Medications Prior to Visit  Medication Dose Route Frequency Provider Last Rate Last Dose  . 0.9 %  sodium chloride infusion  500 mL Intravenous Continuous Pyrtle, Lajuan Lines, MD        ROS Review of Systems  Constitutional: Negative.   Eyes: Negative.   Respiratory: Negative.   Cardiovascular: Negative.   Gastrointestinal: Negative.   Endocrine: Negative.   Skin: Negative.    Objective:  BP 127/76 (BP Location: Left Arm, Patient Position: Sitting, Cuff Size: Normal)   Pulse 68   Temp 98.4 F (36.9 C) (Oral)   Resp 18   Ht 5' (1.524 m)   Wt 166 lb 3.2 oz (75.4 kg)   SpO2 100%   BMI 32.46 kg/m   BP/Weight 08/26/2016 08/20/4763 05/27/5033  Systolic BP 465 681 -  Diastolic BP 76 59 -  Wt. (Lbs) 166.2 161 164  BMI 32.46 31.44 32.57   Physical Exam  Constitutional: She appears well-developed and well-nourished.  Eyes: Conjunctivae are normal. Pupils are equal, round, and reactive to light.  Cardiovascular: Normal rate, regular rhythm, normal heart sounds and intact distal pulses.   Pulmonary/Chest: Effort normal and breath sounds normal.  Abdominal: Soft. Bowel sounds are normal.  Skin: Skin is warm and dry.  Nursing note and vitals reviewed.  Assessment & Plan:   Problem List Items Addressed This Visit      Cardiovascular and Mediastinum   Hypertension   Not fasting will place future order for lipid screening.   Relevant Orders   Lipid Panel     Endocrine   Diabetes mellitus ([place]) - Primary        HgbA1c is very close to goal. Encourage incorporating diet and exercise.        Check CBG BID.        Encouraged to apply  for orange card.         Follow up in 3 months with PCP.   Relevant Orders   Glucose (CBG) (Completed)   HgB A1c (Completed)   Lipid Panel    Other Visit Diagnoses    Healthcare maintenance       Relevant Medications   pneumococcal 13-valent conjugate vaccine (PREVNAR 13) injection 0.5 mL (Completed)      Meds ordered this encounter  Medications  . pneumococcal 13-valent conjugate vaccine (PREVNAR 13) injection 0.5 mL    Follow-up: Return in about 3 months (around 11/26/2016) for Diabetes/ Hypertension.   Alfonse Spruce FNP

## 2016-08-26 NOTE — Progress Notes (Signed)
Patient is here for f/up DM/HTN

## 2016-09-02 ENCOUNTER — Ambulatory Visit: Payer: Self-pay | Attending: Family Medicine

## 2016-09-02 DIAGNOSIS — I10 Essential (primary) hypertension: Secondary | ICD-10-CM | POA: Insufficient documentation

## 2016-09-02 DIAGNOSIS — E119 Type 2 diabetes mellitus without complications: Secondary | ICD-10-CM | POA: Insufficient documentation

## 2016-09-02 NOTE — Progress Notes (Signed)
Patient here for lab visit only 

## 2016-09-03 LAB — LIPID PANEL
CHOL/HDL RATIO: 3.4 ratio (ref 0.0–4.4)
Cholesterol, Total: 163 mg/dL (ref 100–199)
HDL: 48 mg/dL (ref >39)
LDL CALC: 69 mg/dL (ref 0–99)
Triglycerides: 228 mg/dL — ABNORMAL HIGH (ref 0–149)
VLDL CHOLESTEROL CAL: 46 mg/dL — AB (ref 5–40)

## 2016-09-05 ENCOUNTER — Telehealth: Payer: Self-pay

## 2016-09-05 ENCOUNTER — Other Ambulatory Visit: Payer: Self-pay | Admitting: Family Medicine

## 2016-09-05 DIAGNOSIS — E781 Pure hyperglyceridemia: Secondary | ICD-10-CM

## 2016-09-05 MED ORDER — OMEGA-3-ACID ETHYL ESTERS 1 G PO CAPS: 1.0000 g | ORAL_CAPSULE | Freq: Two times a day (BID) | ORAL | 3 refills | Status: DC

## 2016-09-05 MED FILL — OMEGA-3 ETHYL ESTERS 1 GM C: 1 | 30 days supply | Qty: 60 | Fill #0

## 2016-09-05 NOTE — Telephone Encounter (Signed)
-----   Message from Lizbeth BarkMandesia R Hairston, FNP sent at 09/05/2016 11:09 AM EDT ----- Triglycerides are high this can increase your risk of heart disease. You will be prescribed lovaza to help lower levels.  Start eating a diet low in saturated fat. Limit your intake of fried foods, red meats, and whole milk. Increase activity. Recommend follow up in 3 months

## 2016-09-05 NOTE — Telephone Encounter (Signed)
CMA call regarding lab results   Patient verify DOB  Patient was aware and understood  

## 2016-09-12 ENCOUNTER — Ambulatory Visit: Payer: Self-pay | Attending: Family Medicine

## 2016-09-14 ENCOUNTER — Ambulatory Visit: Payer: Self-pay | Attending: Family Medicine | Admitting: Family Medicine

## 2016-09-14 ENCOUNTER — Encounter: Payer: Self-pay | Admitting: Family Medicine

## 2016-09-14 VITALS — BP 138/83 | HR 70 | Temp 98.3°F | Resp 18 | Ht 61.0 in | Wt 167.4 lb

## 2016-09-14 DIAGNOSIS — Z7984 Long term (current) use of oral hypoglycemic drugs: Secondary | ICD-10-CM | POA: Insufficient documentation

## 2016-09-14 DIAGNOSIS — Z7982 Long term (current) use of aspirin: Secondary | ICD-10-CM | POA: Insufficient documentation

## 2016-09-14 DIAGNOSIS — M19012 Primary osteoarthritis, left shoulder: Secondary | ICD-10-CM | POA: Insufficient documentation

## 2016-09-14 DIAGNOSIS — J029 Acute pharyngitis, unspecified: Secondary | ICD-10-CM | POA: Insufficient documentation

## 2016-09-14 DIAGNOSIS — E119 Type 2 diabetes mellitus without complications: Secondary | ICD-10-CM | POA: Insufficient documentation

## 2016-09-14 DIAGNOSIS — J069 Acute upper respiratory infection, unspecified: Secondary | ICD-10-CM

## 2016-09-14 DIAGNOSIS — E0800 Diabetes mellitus due to underlying condition with hyperosmolarity without nonketotic hyperglycemic-hyperosmolar coma (NKHHC): Secondary | ICD-10-CM

## 2016-09-14 LAB — GLUCOSE, POCT (MANUAL RESULT ENTRY): POC Glucose: 200 mg/dl — AB (ref 70–99)

## 2016-09-14 MED ORDER — CETIRIZINE HCL 10 MG PO TABS: 10.0000 mg | ORAL_TABLET | Freq: Every day | ORAL | 2 refills | Status: DC

## 2016-09-14 MED ORDER — FLUTICASONE PROPIONATE 50 MCG/ACT NA SUSP: 2.0000 | Freq: Every day | NASAL | 2 refills | Status: DC

## 2016-09-14 MED ORDER — CAPSAICIN 0.025 % EX CREA: TOPICAL_CREAM | Freq: Two times a day (BID) | CUTANEOUS | 0 refills | Status: DC

## 2016-09-14 MED ORDER — IBUPROFEN 600 MG PO TABS: 600.0000 mg | ORAL_TABLET | Freq: Three times a day (TID) | ORAL | 1 refills | Status: DC | PRN

## 2016-09-14 NOTE — Patient Instructions (Signed)
Artritis (Arthritis) El trmino artritis se usa comnmente para hacer referencia al dolor de las articulaciones o a la enfermedad articular. Hay ms de 100tipos de artritis. CAUSAS La causa ms frecuente de esta afeccin es el desgaste de una articulacin. Algunas otras causas son las siguientes:  Gota.  Inflamacin de una articulacin.  Una infeccin de una articulacin.  Esguinces y otras lesiones cerca de la articulacin.  Una reaccin farmacolgica o alrgica. En algunos casos, es posible que la causa no se conozca. SNTOMAS El sntoma principal de esta afeccin es el dolor de la articulacin con el movimiento. Otros sntomas pueden ser los siguientes:  Enrojecimiento, hinchazn o rigidez de una articulacin.  Calor que emana de la articulacin.  Fiebre.  Sensacin generalizada de estar enfermo. DIAGNSTICO Esta afeccin se puede diagnosticar mediante un examen fsico y estudios, entre ellos:  Anlisis de sangre.  Anlisis de orina.  Estudios de diagnstico por imgenes, como una resonancia magntica (RM), radiografas o una tomografa computarizada (TC). A veces, se extrae lquido de una articulacin para analizarlo. TRATAMIENTO El tratamiento de esta afeccin puede incluir lo siguiente:  El tratamiento de la causa, si se conoce.  Reposo.  Mantener elevada la articulacin.  Aplicar compresas fras o calientes en la articulacin.  Medicamentos para aliviar los sntomas y reducir la inflamacin.  Inyecciones de un corticoide, como cortisona, en la articulacin para ayudar a aliviar el dolor y reducir la inflamacin. Segn la causa de la artritis, tal vez haya que hacer cambios en el estilo de vida para reducir la carga sobre la articulacin. Algunos de los cambios incluyen realizar ms actividad fsica y bajar de peso, entre otros. INSTRUCCIONES PARA EL CUIDADO EN EL HOGAR Medicamentos  Tome los medicamentos de venta libre y los recetados solamente como se lo  haya indicado el mdico.  No tome aspirina para aliviar el dolor si se sospecha la presencia de gota. Actividades  Ponga en reposo la articulacin como se lo haya indicado el mdico. El reposo es importante cuando la enfermedad est activa y la articulacin le duele, est hinchada o rgida.  Evite las actividades que intensifiquen el dolor. Es importante encontrar el equilibrio entre la actividad y el reposo.  Con frecuencia, realice ejercicios de flexibilidad articular como se lo haya indicado el mdico. Intente realizar ejercicios de bajo impacto, por ejemplo: ? Natacin. ? Gimnasia acutica. ? Andar en bicicleta. ? Caminar. Cuidado de la articulacin  Si la articulacin se le hincha, mantngala elevada como se lo haya indicado el mdico.  Si al despertar por la maana, nota que la articulacin est rgida, intente tomar una ducha con agua tibia.  Si se lo indican, pngase calor en la articulacin. Si es diabtico, no se aplique calor sin la autorizacin del mdico. ? Coloque una toalla entre la articulacin y la compresa caliente o la almohadilla trmica. ? Coloque el calor en la zona durante 20 o 30minutos.  Si se lo indican, pngase hielo en la articulacin: ? Ponga el hielo en una bolsa plstica. ? Coloque una toalla entre la piel y la bolsa de hielo. ? Coloque el hielo durante 20minutos, 2 a 3veces por da.  Concurra a todas las visitas de control como se lo haya indicado el mdico. Esto es importante. SOLICITE ATENCIN MDICA SI:  El dolor empeora.  Tiene fiebre. SOLICITE ATENCIN MDICA DE INMEDIATO SI:  Siente dolor, u observa hinchazn o enrojecimiento en la articulacin.  Siente dolor en muchas articulaciones y se le hinchan.  Siente un dolor   intenso en la espalda.  Tiene mucha debilidad en la pierna.  No puede controlar los intestinos o la vejiga. Esta informacin no tiene como fin reemplazar el consejo del mdico. Asegrese de hacerle al mdico cualquier  pregunta que tenga. Document Released: 02/07/2005 Document Revised: 06/01/2015 Document Reviewed: 05/05/2014 Elsevier Interactive Patient Education  2018 Elsevier Inc.   

## 2016-09-14 NOTE — Progress Notes (Signed)
Patient is here for left shoulder/ arm pain

## 2016-09-14 NOTE — Progress Notes (Signed)
Subjective:  Patient ID: Erin Huff, female    DOB: December 30, 1955  Age: 61 y.o. MRN: [id number]  CC: Shoulder Pain  HPI Erin Huff presents for complains of arthralgias for which has been present for 4 days. Pain is located in the left shoulder(s), is described as aching, and is intermittent . She denies any crepitation, decreased range of motion, tenderness and warmth. She reports rain and inactivity makes symptoms worse. She reports taking ibuprofen yesterday with provide adequate relief. She also complaints ofcongestion and sore throat. Onset of symptoms was 3 days ago, unchanged since that time. She also c/o nasal congestion . She denies any fevers, chills, or close sick contacts. She reports taking Vicks Vapor Rub for symptom with minimal relief.   Outpatient Medications Prior to Visit  Medication Sig Dispense Refill  . aspirin EC 81 MG tablet Take 1 tablet (81 mg total) by mouth daily. 30 tablet 11  . Blood Glucose Monitoring Suppl (TRUE METRIX METER) w/Device KIT Use as directed 1 kit 0  . glipiZIDE (GLUCOTROL XL) 5 MG 24 hr tablet Take 1 tablet (5 mg total) by mouth daily with breakfast. 30 tablet 2  . glucose blood (TRUE METRIX BLOOD GLUCOSE TEST) test strip Use as instructed 100 each 12  . levothyroxine (SYNTHROID, LEVOTHROID) 112 MCG tablet Take 1 tablet (112 mcg total) by mouth daily before breakfast. 30 tablet 2  . lisinopril (PRINIVIL,ZESTRIL) 30 MG tablet Take 1 tablet (30 mg total) by mouth daily. 30 tablet 2  . metFORMIN (GLUCOPHAGE) 1000 MG tablet Take 1 tablet (1,000 mg total) by mouth 2 (two) times daily with a meal. 60 tablet 2  . Multiple Vitamin (MULTIVITAMIN) tablet Take 1 tablet by mouth daily.    Marland Kitchen omega-3 acid ethyl esters (LOVAZA) 1 g capsule Take 1 capsule (1 g total) by mouth 2 (two) times daily. 60 capsule 3  . OVER THE COUNTER MEDICATION nutrilite carb blocker as needed- helps with constipation    . TRUEPLUS LANCETS 28G MISC Use as  directed 100 each 5  . cetirizine (ZYRTEC) 10 MG tablet Take 1 tablet (10 mg total) by mouth daily. 30 tablet 2  . fluticasone (FLONASE) 50 MCG/ACT nasal spray Place 2 sprays into both nostrils daily. 16 g 2   Facility-Administered Medications Prior to Visit  Medication Dose Route Frequency Provider Last Rate Last Dose  . 0.9 %  sodium chloride infusion  500 mL Intravenous Continuous Pyrtle, Lajuan Lines, MD        ROS Review of Systems  Constitutional: Negative.   HENT: Positive for congestion.   Respiratory: Negative.   Cardiovascular: Negative.   Musculoskeletal: Positive for arthralgias.  Skin: Negative.    Objective:  BP 138/83 (BP Location: Left Arm, Patient Position: Sitting, Cuff Size: Normal)   Pulse 70   Temp 98.3 F (36.8 C) (Oral)   Resp 18   Ht _0  (1.549 m)   Wt 167 lb 6.4 oz (75.9 kg)   SpO2 97%   BMI 31.63 kg/m   BP/Weight 09/14/2016 08/26/2016 9/38/1017  Systolic BP 510 258 527  Diastolic BP 83 76 59  Wt. (Lbs) 167.4 166.2 161  BMI 31.63 32.46 31.44     Physical Exam  Constitutional: She appears well-developed and well-nourished.  HENT:  Head: Normocephalic and atraumatic.  Right Ear: External ear normal.  Left Ear: External ear normal.  Nose: Nose normal.  Mouth/Throat: Oropharynx is clear and moist.  Eyes: Pupils are equal, round, and reactive to light. Conjunctivae are  normal.  Neck: No JVD present.  Cardiovascular: Normal rate, regular rhythm, normal heart sounds and intact distal pulses.   Pulmonary/Chest: Effort normal and breath sounds normal.  Abdominal: Soft. Bowel sounds are normal.  Musculoskeletal: Normal range of motion.       Right shoulder: She exhibits pain.  Skin: Skin is warm and dry.  Nursing note and vitals reviewed.    Assessment & Plan:   Problem List Items Addressed This Visit      Endocrine   Diabetes mellitus ([place])   Relevant Orders   Glucose (CBG) (Completed)    Other Visit Diagnoses    Primary osteoarthritis of  left shoulder    -  Primary   Relevant Medications   ibuprofen (ADVIL,MOTRIN) 600 MG tablet   capsaicin (ZOSTRIX) 0.025 % cream   Viral URI       Relevant Medications   cetirizine (ZYRTEC) 10 MG tablet   fluticasone (FLONASE) 50 MCG/ACT nasal spray      Meds ordered this encounter  Medications  . ibuprofen (ADVIL,MOTRIN) 600 MG tablet    Sig: Take 1 tablet (600 mg total) by mouth every 8 (eight) hours as needed (Take with food.).    Dispense:  30 tablet    Refill:  1    Order Specific Question:   Supervising Provider    Answer:   Tresa Garter [id number]  . capsaicin (ZOSTRIX) 0.025 % cream    Sig: Apply topically 2 (two) times daily.    Dispense:  60 g    Refill:  0    Order Specific Question:   Supervising Provider    Answer:   Tresa Garter [id number]  . cetirizine (ZYRTEC) 10 MG tablet    Sig: Take 1 tablet (10 mg total) by mouth daily.    Dispense:  30 tablet    Refill:  2    Order Specific Question:   Supervising Provider    Answer:   Tresa Garter [id number]  . fluticasone (FLONASE) 50 MCG/ACT nasal spray    Sig: Place 2 sprays into both nostrils daily.    Dispense:  16 g    Refill:  2    Order Specific Question:   Supervising Provider    Answer:   Tresa Garter [id number]    Follow-up: Return if symptoms worsen or fail to improve.   Alfonse Spruce FNP

## 2016-09-20 ENCOUNTER — Other Ambulatory Visit: Payer: Self-pay | Admitting: Pharmacist

## 2016-09-20 DIAGNOSIS — M19012 Primary osteoarthritis, left shoulder: Secondary | ICD-10-CM

## 2016-09-20 MED ORDER — CAPSAICIN 0.025 % EX CREA: TOPICAL_CREAM | Freq: Two times a day (BID) | CUTANEOUS | 0 refills | Status: DC

## 2016-09-20 MED FILL — ?METFORMIN HCL 1,000 MG TAB: 1000 | 30 days supply | Qty: 60 | Fill #1

## 2016-09-20 MED FILL — LISINOPRIL 10 MG TABLET: 10 | 30 days supply | Qty: 90 | Fill #0

## 2016-09-26 ENCOUNTER — Encounter (HOSPITAL_COMMUNITY): Payer: Self-pay | Admitting: Emergency Medicine

## 2016-09-26 ENCOUNTER — Ambulatory Visit (HOSPITAL_COMMUNITY)
Admission: EM | Admit: 2016-09-26 | Discharge: 2016-09-26 | Disposition: A | Discharge status: Home / Self Care | Payer: Self-pay | Attending: Internal Medicine | Admitting: Internal Medicine

## 2016-09-26 DIAGNOSIS — R42 Dizziness and giddiness: Secondary | ICD-10-CM

## 2016-09-26 DIAGNOSIS — K219 Gastro-esophageal reflux disease without esophagitis: Secondary | ICD-10-CM

## 2016-09-26 DIAGNOSIS — H8302 Labyrinthitis, left ear: Secondary | ICD-10-CM

## 2016-09-26 LAB — POCT I-STAT, CHEM 8
BUN: 11 mg/dL (ref 6–20)
CREATININE: 0.6 mg/dL (ref 0.44–1.00)
Calcium, Ion: 1.05 mmol/L — ABNORMAL LOW (ref 1.15–1.40)
Chloride: 103 mmol/L (ref 101–111)
Glucose, Bld: 99 mg/dL (ref 65–99)
HEMATOCRIT: 40 % (ref 36.0–46.0)
HEMOGLOBIN: 13.6 g/dL (ref 12.0–15.0)
POTASSIUM: 4.1 mmol/L (ref 3.5–5.1)
SODIUM: 135 mmol/L (ref 135–145)
TCO2: 23 mmol/L (ref 0–100)

## 2016-09-26 MED ORDER — RANITIDINE HCL 300 MG PO TABS: 300.0000 mg | ORAL_TABLET | Freq: Every day | ORAL | 2 refills | Status: DC

## 2016-09-26 MED ORDER — MECLIZINE HCL 25 MG PO TABS: 25.0000 mg | ORAL_TABLET | Freq: Three times a day (TID) | ORAL | 0 refills | Status: DC | PRN

## 2016-09-26 NOTE — ED Triage Notes (Signed)
Headaches and dizziness since Friday.  No history of this before.  Started omega 3 prior to feeling bad and concerned this is related.    Epigastric pain, no vomiting or diarrhea.

## 2016-09-26 NOTE — ED Provider Notes (Signed)
[facility]   [id number] 09/26/16 Arrival Time: 1358  ASSESSMENT & PLAN:  1. Labyrinthitis of left ear   2. Gastroesophageal reflux disease, esophagitis presence not specified     Meds ordered this encounter  Medications  . ranitidine (ZANTAC) 300 MG tablet    Sig: Take 1 tablet (300 mg total) by mouth at bedtime.    Dispense:  30 tablet    Refill:  2    Order Specific Question:   Supervising Provider    Answer:   Eustace MooreMURRAY, LAURA W [[id number]]  . meclizine (ANTIVERT) 25 MG tablet    Sig: Take 1 tablet (25 mg total) by mouth 3 (three) times daily as needed for dizziness.    Dispense:  30 tablet    Refill:  0    Order Specific Question:   Supervising Provider    Answer:   Eustace MooreMURRAY, LAURA W [[id number]]    Reviewed expectations re: course of current medical issues. Questions answered. Outlined signs and symptoms indicating need for more acute intervention. Patient verbalized understanding. After Visit Summary given.   SUBJECTIVE:  Erin Huff is a 61 y.o. female who presents with Epigastric pain and dizziness. She denies any complaint of chest pain or palpitations, shortness of breath, exertional dyspnea, fever, chills, nausea, vomiting, or other systemic complaints. Dizziness has been ongoing for 3 days. She has no ringing in the ears, ear pain or pressure, or congestion. She does not drink, does not smoke, does not engage in recreational drug use.  ROS: As per HPI, remainder of ROS negative.   OBJECTIVE:  Vitals:   09/26/16 1525  BP: (!) 151/77  Pulse: 70  Resp: 18  Temp: 98.6 F (37 C)  TempSrc: Oral  SpO2: 100%     General appearance: alert; no distress HEENT: normocephalic; atraumatic; conjunctivae normal; TMs normal bilaterally, there is an effusion noted to the left ear Neck: No JVD, no cervical lymphadenopathy Lungs: clear to auscultation bilaterally Heart: regular rate and rhythm Abdomen: soft, non-tender; bowel sounds normal; no masses or  organomegaly; no guarding or rebound tenderness Back: no CVA tenderness Extremities: no cyanosis or edema; symmetrical with no gross deformities Skin: warm and dry Neurologic: Grossly normal Psychological:  alert and cooperative; normal mood and affect  Procedures:   Twelve-lead EKG shows normal sinus rhythm with no elevation or ectopy, ventricular rate is 71 with a PR interval of 162 ms, QRS duration of 96 ms DT-QTC 410-445 ms, there is good R-wave progression, with a normal axis.  Results for orders placed or performed during the hospital encounter of 09/26/16  I-STAT, chem 8  Result Value Ref Range   Sodium 135 135 - 145 mmol/L   Potassium 4.1 3.5 - 5.1 mmol/L   Chloride 103 101 - 111 mmol/L   BUN 11 6 - 20 mg/dL   Creatinine, Ser 9.560.60 0.44 - 1.00 mg/dL   Glucose, Bld 99 65 - 99 mg/dL   Calcium, Ion 2.131.05 (L) 1.15 - 1.40 mmol/L   TCO2 23 0 - 100 mmol/L   Hemoglobin 13.6 12.0 - 15.0 g/dL   HCT 08.640.0 57.836.0 - 46.946.0 %    Labs Reviewed  POCT I-STAT, CHEM 8 - Abnormal; Notable for the following:       Result Value   Calcium, Ion 1.05 (*)    All other components within normal limits    No results found.  Allergies  Allergen Reactions  . Penicillins Rash    PMHx, SurgHx, SocialHx, Medications, and Allergies were reviewed  in the Visit Navigator and updated as appropriate.       Dorena Bodo, NP 09/26/16 1626

## 2016-09-26 NOTE — Discharge Instructions (Signed)
Follow-up with primary care provider as needed if symptoms persist, at any time if they worsen, go to the emergency room

## 2016-11-01 MED FILL — glipiZIDE XL 5 MG TB24: 5 | 30 days supply | Qty: 30 | Fill #1

## 2016-11-01 MED FILL — OMEGA-3 ETHYL ESTERS 1 GM C: 1 | 30 days supply | Qty: 60 | Fill #1

## 2016-11-01 MED FILL — ?METFORMIN HCL 1,000 MG TAB: 1000 | 30 days supply | Qty: 60 | Fill #2

## 2016-11-08 ENCOUNTER — Telehealth: Payer: Self-pay | Admitting: Family Medicine

## 2016-11-08 NOTE — Telephone Encounter (Signed)
Pt. Came to facility requesting to be referred out to the dentist for cleaning. Please f/u

## 2016-11-09 ENCOUNTER — Other Ambulatory Visit: Payer: Self-pay | Admitting: Family Medicine

## 2016-11-09 DIAGNOSIS — Z9189 Other specified personal risk factors, not elsewhere classified: Secondary | ICD-10-CM

## 2016-11-09 NOTE — Telephone Encounter (Signed)
Dentist referral will be placed. Please advise patient of waiting list for referrals. Also provide with dental clinic information.

## 2016-11-09 NOTE — Telephone Encounter (Signed)
CMA call regarding letting the patient know that her pcp put the dentist referral in   Patient did not answer but left a detailed message & to call back if have any questions

## 2016-11-09 NOTE — Telephone Encounter (Signed)
Pt. Came to facility requesting to be referred out to the dentist for cleaning. Please advice

## 2016-11-09 NOTE — Telephone Encounter (Signed)
Pt called back and was informed that referral has been placed. Pt was advised of process. Pt understood

## 2016-11-17 ENCOUNTER — Telehealth: Payer: Self-pay | Admitting: Family Medicine

## 2016-11-17 DIAGNOSIS — E1165 Type 2 diabetes mellitus with hyperglycemia: Secondary | ICD-10-CM

## 2016-11-17 DIAGNOSIS — E039 Hypothyroidism, unspecified: Secondary | ICD-10-CM

## 2016-11-17 MED ORDER — LEVOTHYROXINE SODIUM 112 MCG PO TABS: 112.0000 ug | ORAL_TABLET | Freq: Every day | ORAL | 2 refills | Status: DC

## 2016-11-17 MED ORDER — LISINOPRIL 30 MG PO TABS: 30.0000 mg | ORAL_TABLET | Freq: Every day | ORAL | 2 refills | Status: DC

## 2016-11-17 MED ORDER — METFORMIN HCL 1000 MG PO TABS: 1000.0000 mg | ORAL_TABLET | Freq: Two times a day (BID) | ORAL | 2 refills | Status: DC

## 2016-11-17 MED FILL — ?METFORMIN HCL 1,000 MG TAB: 1000 | 30 days supply | Qty: 60 | Fill #0

## 2016-11-17 MED FILL — LISINOPRIL 10 MG TABS: 10 | 30 days supply | Qty: 90 | Fill #0

## 2016-11-17 MED FILL — LEVOTHYROXINE 112 MCG TAB: 112 | 30 days supply | Qty: 30 | Fill #0

## 2016-11-17 NOTE — Telephone Encounter (Signed)
Requested medications refilled 

## 2016-11-17 NOTE — Telephone Encounter (Signed)
Patient called the office requesting medication refill for metformin, bp medication and thyroid med. Please send it to our pharmacy, [facility].  Thank you.

## 2016-11-18 ENCOUNTER — Ambulatory Visit: Payer: Self-pay | Attending: Family Medicine

## 2016-12-02 ENCOUNTER — Encounter: Payer: Self-pay | Admitting: Family Medicine

## 2016-12-02 ENCOUNTER — Ambulatory Visit: Payer: Self-pay | Attending: Family Medicine | Admitting: Family Medicine

## 2016-12-02 VITALS — BP 138/84 | HR 71 | Temp 98.3°F | Resp 18 | Ht 60.0 in | Wt 174.2 lb

## 2016-12-02 DIAGNOSIS — R300 Dysuria: Secondary | ICD-10-CM

## 2016-12-02 DIAGNOSIS — Z23 Encounter for immunization: Secondary | ICD-10-CM

## 2016-12-02 DIAGNOSIS — E039 Hypothyroidism, unspecified: Secondary | ICD-10-CM

## 2016-12-02 DIAGNOSIS — K029 Dental caries, unspecified: Secondary | ICD-10-CM

## 2016-12-02 DIAGNOSIS — Z7984 Long term (current) use of oral hypoglycemic drugs: Secondary | ICD-10-CM | POA: Insufficient documentation

## 2016-12-02 DIAGNOSIS — I10 Essential (primary) hypertension: Secondary | ICD-10-CM

## 2016-12-02 DIAGNOSIS — E119 Type 2 diabetes mellitus without complications: Secondary | ICD-10-CM

## 2016-12-02 DIAGNOSIS — E781 Pure hyperglyceridemia: Secondary | ICD-10-CM

## 2016-12-02 DIAGNOSIS — Z7982 Long term (current) use of aspirin: Secondary | ICD-10-CM | POA: Insufficient documentation

## 2016-12-02 DIAGNOSIS — Z79899 Other long term (current) drug therapy: Secondary | ICD-10-CM | POA: Insufficient documentation

## 2016-12-02 LAB — POCT URINALYSIS DIPSTICK
Bilirubin, UA: NEGATIVE
GLUCOSE UA: NEGATIVE
Ketones, UA: NEGATIVE
Leukocytes, UA: NEGATIVE
Nitrite, UA: NEGATIVE
PROTEIN UA: NEGATIVE
RBC UA: NEGATIVE
UROBILINOGEN UA: 0.2 U/dL
pH, UA: 6.5 (ref 5.0–8.0)

## 2016-12-02 LAB — POCT GLYCOSYLATED HEMOGLOBIN (HGB A1C): Hemoglobin A1C: 7.5

## 2016-12-02 LAB — GLUCOSE, POCT (MANUAL RESULT ENTRY): POC GLUCOSE: 149 mg/dL — AB (ref 70–99)

## 2016-12-02 MED ORDER — METFORMIN HCL 1000 MG PO TABS: 1000.0000 mg | ORAL_TABLET | Freq: Two times a day (BID) | ORAL | 3 refills | Status: DC

## 2016-12-02 MED ORDER — OMEGA-3-ACID ETHYL ESTERS 1 G PO CAPS: 1.0000 g | ORAL_CAPSULE | Freq: Two times a day (BID) | ORAL | 3 refills | Status: DC

## 2016-12-02 MED ORDER — GLIPIZIDE ER 10 MG PO TB24: 10.0000 mg | ORAL_TABLET | Freq: Every day | ORAL | 3 refills | Status: DC

## 2016-12-02 MED ORDER — FLUCONAZOLE 150 MG PO TABS: 150.0000 mg | ORAL_TABLET | Freq: Once | ORAL | 0 refills | Status: AC

## 2016-12-02 MED ORDER — LISINOPRIL 40 MG PO TABS: 40.0000 mg | ORAL_TABLET | Freq: Every day | ORAL | 3 refills | Status: DC

## 2016-12-02 MED FILL — OMEGA-3 ETHYL ESTERS 1 GM C: 1 | 30 days supply | Qty: 60 | Fill #0

## 2016-12-02 MED FILL — glipiZIDE XL 10 MG TB24: 10 | 30 days supply | Qty: 30 | Fill #0

## 2016-12-02 MED FILL — FLUCONAZOLE 150 MG TABLET: 150 | 1 days supply | Qty: 1 | Fill #0

## 2016-12-02 NOTE — Patient Instructions (Signed)
La diabetes mellitus y los alimentos (Diabetes Mellitus and Food) Es importante que controle su nivel de azcar en la sangre (glucosa). El nivel de glucosa en sangre depende en gran medida de lo que usted come. Comer alimentos saludables en las cantidades adecuadas a lo largo del da, aproximadamente a la misma hora todos los das, lo ayudar a controlar su nivel de glucosa en sangre. Tambin puede ayudarlo a retrasar o evitar el empeoramiento de la diabetes mellitus. Comer de manera saludable incluso puede ayudarlo a mejorar el nivel de presin arterial y a alcanzar o mantener un peso saludable. Entre las recomendaciones generales para alimentarse y cocinar los alimentos de forma saludable, se incluyen las siguientes:  Respetar las comidas principales y comer colaciones con regularidad. Evitar pasar largos perodos sin comer con el fin de perder peso.  Seguir una dieta que consista principalmente en alimentos de origen vegetal, como frutas, vegetales, frutos secos, legumbres y cereales integrales.  Utilizar mtodos de coccin a baja temperatura, como hornear, en lugar de mtodos de coccin a alta temperatura, como frer en abundante aceite. Trabaje con el nutricionista para aprender a usar la informacin nutricional de las etiquetas de los alimentos. CMO PUEDEN AFECTARME LOS ALIMENTOS? Carbohidratos Los carbohidratos afectan el nivel de glucosa en sangre ms que cualquier otro tipo de alimento. El nutricionista lo ayudar a determinar cuntos carbohidratos puede consumir en cada comida y ensearle a contarlos. El recuento de carbohidratos es importante para mantener la glucosa en sangre en un nivel saludable, en especial si utiliza insulina o toma determinados medicamentos para la diabetes mellitus. Alcohol El alcohol puede provocar disminuciones sbitas de la glucosa en sangre (hipoglucemia), en especial si utiliza insulina o toma determinados medicamentos para la diabetes mellitus. La  hipoglucemia es una afeccin que puede poner en peligro la vida. Los sntomas de la hipoglucemia (somnolencia, mareos y desorientacin) son similares a los sntomas de haber consumido mucho alcohol. Si el mdico lo autoriza a beber alcohol, hgalo con moderacin y siga estas pautas:  Las mujeres no deben beber ms de un trago por da, y los hombres no deben beber ms de dos tragos por da. Un trago es igual a:  12 onzas (355 ml) de cerveza  5 onzas de vino (150 ml) de vino  1,5onzas (45ml) de bebidas espirituosas  No beba con el estmago vaco.  Mantngase hidratado. Beba agua, gaseosas dietticas o t helado sin azcar.  Las gaseosas comunes, los jugos y otros refrescos podran contener muchos carbohidratos y se deben contar. QU ALIMENTOS NO SE RECOMIENDAN? Cuando haga las elecciones de alimentos, es importante que recuerde que todos los alimentos son distintos. Algunos tienen menos nutrientes que otros por porcin, aunque podran tener la misma cantidad de caloras o carbohidratos. Es difcil darle al cuerpo lo que necesita cuando consume alimentos con menos nutrientes. Estos son algunos ejemplos de alimentos que debera evitar ya que contienen muchas caloras y carbohidratos, pero pocos nutrientes:  Grasas trans (la mayora de los alimentos procesados incluyen grasas trans en la etiqueta de Informacin nutricional).  Gaseosas comunes.  Jugos.  Caramelos.  Dulces, como tortas, pasteles, rosquillas y galletas.  Comidas fritas. QU ALIMENTOS PUEDO COMER? Consuma alimentos ricos en nutrientes, que nutrirn el cuerpo y lo mantendrn saludable. Los alimentos que debe comer tambin dependern de varios factores, como:  Las caloras que necesita.  Los medicamentos que toma.  Su peso.  El nivel de glucosa en sangre.  El nivel de presin arterial.  El nivel de colesterol. Debe consumir   una amplia variedad de alimentos, por ejemplo:  Protenas.  Cortes de carne  magros.  Protenas con bajo contenido de grasas saturadas, como pescado, clara de huevo y frijoles. Evite las carnes procesadas.  Frutas y vegetales.  Frutas y vegetales que pueden ayudar a controlar los niveles sanguneos de glucosa, como manzanas, mangos y batatas.  Productos lcteos.  Elija productos lcteos sin grasa o con bajo contenido de grasa, como leche, yogur y queso.  Cereales, panes, pastas y arroz.  Elija cereales integrales, como panes multicereales, avena en grano y arroz integral. Estos alimentos pueden ayudar a controlar la presin arterial.  Grasas.  Alimentos que contengan grasas saludables, como frutos secos, aguacate, aceite de oliva, aceite de canola y pescado. TODOS LOS QUE PADECEN DIABETES MELLITUS TIENEN EL MISMO PLAN DE COMIDAS? Dado que todas las personas que padecen diabetes mellitus son distintas, no hay un solo plan de comidas que funcione para todos. Es muy importante que se rena con un nutricionista que lo ayudar a crear un plan de comidas adecuado para usted. Esta informacin no tiene como fin reemplazar el consejo del mdico. Asegrese de hacerle al mdico cualquier pregunta que tenga. Document Released: 05/17/2007 Document Revised: 02/28/2014 Document Reviewed: 01/04/2013 Elsevier Interactive Patient Education  2017 Elsevier Inc.  

## 2016-12-02 NOTE — Progress Notes (Signed)
JAPatient is here for f/up   Patient complain burning sensation when urination

## 2016-12-02 NOTE — Progress Notes (Signed)
  Symptoms began  3 to 4 days  It is not constant occurs after she finishes urination  Once or twice day    BP

## 2016-12-03 NOTE — Progress Notes (Signed)
Subjective:  Patient ID: Erin Huff, female    DOB: 1955-05-11  Age: 61 y.o. MRN: [id number]  CC: Hypertension and Diabetes   HPI Nikaela Luis-Altamirano  for follow up for DM and HTN. History of DM. Symptoms: none. Patient denies foot ulcerations, nausea, paresthesia of the feet, visual disturbances and vomitting.  Evaluation to date has been included: fasting blood sugar, fasting lipid panel, hemoglobin A1C and microalbuminuria.   Treatment to date: Continued sulfonylurea and metformin that has been effective. History of hypertension. She is not exercising and is not adherent to low salt diet. She does not check BP at home. Cardiac symptoms none. Patient denies chest pain, chest pressure/discomfort, claudication, lower extremity edema, near-syncope, palpitations and syncope.  Cardiovascular risk factors: diabetes mellitus, dyslipidemia, hypertension and sedentary lifestyle. Use of agents associated with hypertension: none. History of target organ damage: none. She complains of burning with urination. Onset 4 days ago. No c/o back pain, cloudy or foul smelling urine. She denies any vaginal discharge    Outpatient Medications Prior to Visit  Medication Sig Dispense Refill  . aspirin EC 81 MG tablet Take 1 tablet (81 mg total) by mouth daily. 30 tablet 11  . Blood Glucose Monitoring Suppl (TRUE METRIX METER) w/Device KIT Use as directed 1 kit 0  . capsaicin (ZOSTRIX) 0.025 % cream Apply topically 2 (two) times daily. 60 g 0  . cetirizine (ZYRTEC) 10 MG tablet Take 1 tablet (10 mg total) by mouth daily. 30 tablet 2  . fluticasone (FLONASE) 50 MCG/ACT nasal spray Place 2 sprays into both nostrils daily. 16 g 2  . glucose blood (TRUE METRIX BLOOD GLUCOSE TEST) test strip Use as instructed 100 each 12  . ibuprofen (ADVIL,MOTRIN) 600 MG tablet Take 1 tablet (600 mg total) by mouth every 8 (eight) hours as needed (Take with food.). 30 tablet 1  . levothyroxine (SYNTHROID, LEVOTHROID)  112 MCG tablet Take 1 tablet (112 mcg total) by mouth daily before breakfast. 30 tablet 2  . meclizine (ANTIVERT) 25 MG tablet Take 1 tablet (25 mg total) by mouth 3 (three) times daily as needed for dizziness. 30 tablet 0  . Multiple Vitamin (MULTIVITAMIN) tablet Take 1 tablet by mouth daily.    Marland Kitchen OVER THE COUNTER MEDICATION nutrilite carb blocker as needed- helps with constipation    . ranitidine (ZANTAC) 300 MG tablet Take 1 tablet (300 mg total) by mouth at bedtime. 30 tablet 2  . TRUEPLUS LANCETS 28G MISC Use as directed 100 each 5  . glipiZIDE (GLUCOTROL XL) 5 MG 24 hr tablet Take 1 tablet (5 mg total) by mouth daily with breakfast. 30 tablet 2  . lisinopril (PRINIVIL,ZESTRIL) 30 MG tablet Take 1 tablet (30 mg total) by mouth daily. 30 tablet 2  . metFORMIN (GLUCOPHAGE) 1000 MG tablet Take 1 tablet (1,000 mg total) by mouth 2 (two) times daily with a meal. 60 tablet 2  . omega-3 acid ethyl esters (LOVAZA) 1 g capsule Take 1 capsule (1 g total) by mouth 2 (two) times daily. 60 capsule 3   Facility-Administered Medications Prior to Visit  Medication Dose Route Frequency Provider Last Rate Last Dose  . 0.9 %  sodium chloride infusion  500 mL Intravenous Continuous Pyrtle, Lajuan Lines, MD        ROS Review of Systems  Constitutional: Negative.   Eyes: Negative.   Respiratory: Negative.   Cardiovascular: Negative.   Gastrointestinal: Negative.   Genitourinary: Positive for dysuria.  Skin: Negative.    Objective:  BP 138/84   Pulse 71   Temp 98.3 F (36.8 C) (Oral)   Resp 18   Ht 5' (1.524 m)   Wt 174 lb 3.2 oz (79 kg)   SpO2 98%   BMI 34.02 kg/m   BP/Weight 12/02/2016 09/26/2016 6/78/9381  Systolic BP 017 510 258  Diastolic BP 84 77 83  Wt. (Lbs) 174.2 - 167.4  BMI 34.02 - 31.63   Physical Exam  Constitutional: She appears well-developed and well-nourished.  Eyes: Pupils are equal, round, and reactive to light. Conjunctivae are normal.  Cardiovascular: Normal rate, regular  rhythm, normal heart sounds and intact distal pulses.   Pulmonary/Chest: Effort normal and breath sounds normal.  Abdominal: Soft. Bowel sounds are normal. There is no tenderness.  Skin: Skin is warm and dry.  Nursing note and vitals reviewed.  Assessment & Plan:   1. Type 2 diabetes mellitus without complication, without long-term current use of insulin (HCC)  - Glucose (CBG) - HgB A1c - glipiZIDE (GLUCOTROL XL) 10 MG 24 hr tablet; Take 1 tablet (10 mg total) by mouth daily with breakfast.  Dispense: 30 tablet; Refill: 3 - Ambulatory referral to Ophthalmology - metFORMIN (GLUCOPHAGE) 1000 MG tablet; Take 1 tablet (1,000 mg total) by mouth 2 (two) times daily with a meal.  Dispense: 60 tablet; Refill: 3  2. Needs flu shot  - Flu Vaccine QUAD 6+ mos PF IM (Fluarix Quad PF)  3. Dysuria  - Urinalysis Dipstick - fluconazole (DIFLUCAN) 150 MG tablet; Take 1 tablet (150 mg total) by mouth once.  Dispense: 1 tablet; Refill: 0  4. Hypertriglyceridemia  - omega-3 acid ethyl esters (LOVAZA) 1 g capsule; Take 1 capsule (1 g total) by mouth 2 (two) times daily.  Dispense: 60 capsule; Refill: 3 - Lipid Panel  5. Essential hypertension  - lisinopril (PRINIVIL,ZESTRIL) 40 MG tablet; Take 1 tablet (40 mg total) by mouth daily.  Dispense: 30 tablet; Refill: 3  6. Dental caries  - Ambulatory referral to Dentistry  7. Hypothyroidism, unspecified type  - TSH   Meds ordered this encounter  Medications  . glipiZIDE (GLUCOTROL XL) 10 MG 24 hr tablet    Sig: Take 1 tablet (10 mg total) by mouth daily with breakfast.    Dispense:  30 tablet    Refill:  3    Order Specific Question:   Supervising Provider    Answer:   Tresa Garter [id number]  . omega-3 acid ethyl esters (LOVAZA) 1 g capsule    Sig: Take 1 capsule (1 g total) by mouth 2 (two) times daily.    Dispense:  60 capsule    Refill:  3    Order Specific Question:   Supervising Provider    Answer:   Tresa Garter  [id number]  . metFORMIN (GLUCOPHAGE) 1000 MG tablet    Sig: Take 1 tablet (1,000 mg total) by mouth 2 (two) times daily with a meal.    Dispense:  60 tablet    Refill:  3    Order Specific Question:   Supervising Provider    Answer:   Tresa Garter [id number]  . lisinopril (PRINIVIL,ZESTRIL) 40 MG tablet    Sig: Take 1 tablet (40 mg total) by mouth daily.    Dispense:  30 tablet    Refill:  3    Order Specific Question:   Supervising Provider    Answer:   Tresa Garter [id number]  . fluconazole (DIFLUCAN) 150 MG tablet    Sig: Take  1 tablet (150 mg total) by mouth once.    Dispense:  1 tablet    Refill:  0    Order Specific Question:   Supervising Provider    Answer:   Tresa Garter [[id number]]    Follow-up: Return in about 3 weeks (around 12/23/2016) for HTN/DM with Stacy.   Alfonse Spruce FNP

## 2016-12-04 LAB — LIPID PANEL
Chol/HDL Ratio: 3.8 ratio (ref 0.0–4.4)
Cholesterol, Total: 195 mg/dL (ref 100–199)
HDL: 51 mg/dL (ref >39)
LDL CALC: 86 mg/dL (ref 0–99)
Triglycerides: 288 mg/dL — ABNORMAL HIGH (ref 0–149)
VLDL CHOLESTEROL CAL: 58 mg/dL — AB (ref 5–40)

## 2016-12-04 LAB — TSH: TSH: 16.06 u[IU]/mL — AB (ref 0.450–4.500)

## 2016-12-20 ENCOUNTER — Other Ambulatory Visit: Payer: Self-pay | Admitting: Family Medicine

## 2016-12-20 DIAGNOSIS — E039 Hypothyroidism, unspecified: Secondary | ICD-10-CM

## 2016-12-20 DIAGNOSIS — E1169 Type 2 diabetes mellitus with other specified complication: Secondary | ICD-10-CM | POA: Insufficient documentation

## 2016-12-20 DIAGNOSIS — E782 Mixed hyperlipidemia: Secondary | ICD-10-CM

## 2016-12-20 MED ORDER — LEVOTHYROXINE SODIUM 137 MCG PO TABS: 137.0000 ug | ORAL_TABLET | Freq: Every day | ORAL | 0 refills | Status: DC

## 2016-12-20 MED ORDER — ATORVASTATIN CALCIUM 10 MG PO TABS: 10.0000 mg | ORAL_TABLET | Freq: Every day | ORAL | 0 refills | Status: DC

## 2016-12-21 ENCOUNTER — Telehealth: Payer: Self-pay

## 2016-12-21 ENCOUNTER — Ambulatory Visit: Payer: Self-pay | Attending: Family Medicine | Admitting: Pharmacist

## 2016-12-21 VITALS — BP 156/83 | HR 86 | Temp 97.6°F

## 2016-12-21 DIAGNOSIS — R42 Dizziness and giddiness: Secondary | ICD-10-CM | POA: Insufficient documentation

## 2016-12-21 DIAGNOSIS — E1165 Type 2 diabetes mellitus with hyperglycemia: Secondary | ICD-10-CM | POA: Insufficient documentation

## 2016-12-21 DIAGNOSIS — E119 Type 2 diabetes mellitus without complications: Secondary | ICD-10-CM

## 2016-12-21 DIAGNOSIS — I10 Essential (primary) hypertension: Secondary | ICD-10-CM

## 2016-12-21 LAB — GLUCOSE, POCT (MANUAL RESULT ENTRY): POC Glucose: 103 mg/dl — AB (ref 70–99)

## 2016-12-21 MED FILL — ?ATORVASTATIN 10 MG TABLET: 10 | 30 days supply | Qty: 30 | Fill #0

## 2016-12-21 MED FILL — ?LEVOTHYROXINE 137 MCG TAB: 137 | 30 days supply | Qty: 30 | Fill #0

## 2016-12-21 NOTE — Patient Instructions (Addendum)
Thanks for coming to see Erin Huff  Get some blood sugars 2 hours after you eat or before bed.  Drink plenty of water and rest.  Cough - guaifenesin, dextromethorphan Runny nose - fexofenadine or loratadine  Come back in 1 month

## 2016-12-21 NOTE — Progress Notes (Signed)
    S:     Chief Complaint  Patient presents with  . Medication Management    Patient arrives in good spirits.  Presents for diabetes evaluation, education, and management at the request of Arrie SenateMandesia Hairston, FNP. Patient was referred on 12/02/16.  Patient was last seen by Primary Care Provider on 12/02/16. [employer] interpreter Koleen Nimroddrian [phone] was used for the entirety of the visit.   Patient reports adherence with medications.  Current diabetes medications include: metformin 1000 mg BID, glipizide XL 10 mg daily. Current hypertension medications include: lisinopril 40 mg daily  Patient denies hypoglycemic events.  Patient reports checking her blood pressure at home occasionally. She reports that the systolic is about 130 but she doesn't remember the diastolic.  She reports that she feels dizzy today. Her husband has a cold and she thinks she has caught it. Reports some sinus pressure causing a headache. Denies ear pain. Denies N/V/D. Denies any fevers at home.   O:  Physical Exam   ROS   Lab Results  Component Value Date   HGBA1C 7.5 12/02/2016   There were no vitals filed for this visit.  Home fasting CBG: 130s 2 hour post-prandial/random CBG: none  POCT glucose = 103  A/P: Diabetes longstanding currently uncontrolled based on A1c of 7.5, worsened from last A1c of 7.2. Patient denies hypoglycemic events and is able to verbalize appropriate hypoglycemia management plan. Patient reports adherence with medication. Control is suboptimal due to dietary indiscretion and sedentary lifestyle.  Continue current medications. Encouraged patient to get post-prandial readings to best assess her control and to bring her meter to the next appt.  Dizziness possibly due to cold. No fever. Patient's blood pressure was elevated today but likely due to cold. No hypoglycemia. She denies that this is like previous dizziness that she was seen at Urgent Care for (seen for labyrinthitis of left  ear). She feels like it is from the cold that both her and her husband have. Encouraged her to drink plenty of water and to rest. Provided some information about medications she can take for cold without impacting her blood pressure or blood glucose. Patient to return if dizziness continues or go to ED if it worsens or new symptoms develop.  Next A1C anticipated January 2019.     Hypertension longstanding currently uncontrolled likely due to cold.  Patient reports adherence with medication. Will recheck at next visit.   Written patient instructions provided.  Total time in face to face counseling 30 minutes.   Follow up in Pharmacist Clinic Visit in 4 weeks.   Patient seen with Theodoro Kosasheed Mohammed, PharmD Candidate

## 2016-12-21 NOTE — Telephone Encounter (Signed)
-----   Message from Lizbeth BarkMandesia R Hairston, [place] sent at 12/20/2016  7:55 PM EDT ----- Thyroid levels are abnormal. Your dosage of levothyroxine will be increased.  Lipid levels are elevated. You will be prescribed atorvastatin.  Limit your intake of fried foods, red meats, and whole milk.  Recommend follow up in 3 months.

## 2016-12-21 NOTE — Telephone Encounter (Signed)
CMA call regarding lab results   Patient Verify DOB   Patient was aware and understood  

## 2016-12-30 MED FILL — LISINOPRIL 10 MG TABS: 10 | 30 days supply | Qty: 90 | Fill #1

## 2016-12-30 MED FILL — glipiZIDE XL 10 MG TB24: 10 | 30 days supply | Qty: 30 | Fill #1

## 2016-12-30 MED FILL — ?METFORMIN HCL 1,000 MG TAB: 1000 | 30 days supply | Qty: 60 | Fill #1

## 2017-01-16 MED FILL — ?ATORVASTATIN 10 MG TABLET: 10 | 30 days supply | Qty: 30 | Fill #1

## 2017-01-16 MED FILL — ?LEVOTHYROXINE 137 MCG TAB: 137 | 30 days supply | Qty: 30 | Fill #1

## 2017-01-26 MED FILL — ?METFORMIN HCL 1,000 MG TAB: 1000 | 30 days supply | Qty: 60 | Fill #2

## 2017-02-06 MED FILL — glipiZIDE XL 10 MG TB24: 10 | 30 days supply | Qty: 30 | Fill #2

## 2017-02-15 ENCOUNTER — Ambulatory Visit: Payer: Self-pay | Attending: Family Medicine | Admitting: Pharmacist

## 2017-02-15 ENCOUNTER — Encounter: Payer: Self-pay | Admitting: Pharmacist

## 2017-02-15 DIAGNOSIS — E119 Type 2 diabetes mellitus without complications: Secondary | ICD-10-CM | POA: Insufficient documentation

## 2017-02-15 DIAGNOSIS — Z7984 Long term (current) use of oral hypoglycemic drugs: Secondary | ICD-10-CM | POA: Insufficient documentation

## 2017-02-15 DIAGNOSIS — Z79899 Other long term (current) drug therapy: Secondary | ICD-10-CM | POA: Insufficient documentation

## 2017-02-15 LAB — GLUCOSE, POCT (MANUAL RESULT ENTRY): POC GLUCOSE: 114 mg/dL — AB (ref 70–99)

## 2017-02-15 NOTE — Progress Notes (Signed)
    S:     Chief Complaint  Patient presents with  . Medication Management    Patient arrives in good spirits.  Presents for diabetes evaluation, education, and management. Patient was last seen by Primary Care Provider on 12/02/16. [employer] interpreter Herbert DeanerLua [phone] was used for the entirety of the visit.   Patient reports adherence with medications.  Current diabetes medications include: metformin 1000 mg BID, glipizide XL 10 mg daily. Current hypertension medications include: lisinopril 40 mg daily  Patient denies hypoglycemic events.  She reports that she would like to get her thyroid levels checked and that she has chronic shoulder pain that she has discussed with PCP but would like to discuss more. She also reports that her financial assistance expires in 2 days so she would like to get all of this evaluated today.   O:  Physical Exam   ROS   Lab Results  Component Value Date   HGBA1C 7.5 12/02/2016   There were no vitals filed for this visit.  Home fasting CBG: 110s-130s 2 hour post-prandial/random CBG: 160 and under  POCT glucose = 114  A/P: Diabetes longstanding currently uncontrolled based on A1c of 7.5, worsened from last A1c of 7.2. Patient denies hypoglycemic events and is able to verbalize appropriate hypoglycemia management plan. Patient reports adherence with medication. Control is suboptimal due to dietary indiscretion and sedentary lifestyle.  Continue current medications. Patient needs to be seen by PCP for evaluation of other disease states Patient to follow up with PCP in 2-3 weeks for 3 month follow up. Instructed her to follow up with PCP concerning chronic pain and thyroid. Patient to also make an appt for financial counseling to renew discount. Patient verbalized understanding.  Next A1C anticipated January 2019.     Written patient instructions provided.  Total time in face to face counseling 15 minutes.

## 2017-02-15 NOTE — Patient Instructions (Addendum)
Thank you for coming to see me  No changes to your medicines today  Please schedule an appointment with Erin Huff in 2-3 weeks for 3 month diabetes follow up  Schedule an appointment for financial counseling

## 2017-02-20 ENCOUNTER — Ambulatory Visit: Payer: Self-pay | Attending: Family Medicine

## 2017-02-23 MED FILL — ?LEVOTHYROXINE 137 MCG TAB: 137 | 30 days supply | Qty: 30 | Fill #2

## 2017-03-02 ENCOUNTER — Telehealth: Payer: Self-pay | Admitting: Family Medicine

## 2017-03-02 DIAGNOSIS — E119 Type 2 diabetes mellitus without complications: Secondary | ICD-10-CM

## 2017-03-02 MED ORDER — METFORMIN HCL 1000 MG PO TABS: 1000.0000 mg | ORAL_TABLET | Freq: Two times a day (BID) | ORAL | 0 refills | Status: DC

## 2017-03-02 MED FILL — ?METFORMIN HCL 1,000 MG TAB: 1000 | 30 days supply | Qty: 60 | Fill #0

## 2017-03-02 NOTE — Telephone Encounter (Signed)
Refilled x 30 days, needs appt with PCP

## 2017-03-02 NOTE — Addendum Note (Signed)
Addended by: Santa LighterHAMMER, STACEY K on: 03/02/2017 04:36 PM   Modules accepted: Orders

## 2017-03-02 NOTE — Telephone Encounter (Signed)
Pt called to request a refill on  -metFORMIN (GLUCOPHAGE) 1000 MG tablet  If approved please send it to [facility] Please follow up

## 2017-03-09 ENCOUNTER — Ambulatory Visit: Payer: Self-pay | Attending: Family Medicine | Admitting: Family Medicine

## 2017-03-09 VITALS — BP 130/76 | HR 79 | Temp 98.7°F | Resp 18 | Ht 62.0 in | Wt 171.0 lb

## 2017-03-09 DIAGNOSIS — I10 Essential (primary) hypertension: Secondary | ICD-10-CM | POA: Insufficient documentation

## 2017-03-09 DIAGNOSIS — Z7984 Long term (current) use of oral hypoglycemic drugs: Secondary | ICD-10-CM | POA: Insufficient documentation

## 2017-03-09 DIAGNOSIS — Z79899 Other long term (current) drug therapy: Secondary | ICD-10-CM | POA: Insufficient documentation

## 2017-03-09 DIAGNOSIS — E782 Mixed hyperlipidemia: Secondary | ICD-10-CM

## 2017-03-09 DIAGNOSIS — K029 Dental caries, unspecified: Secondary | ICD-10-CM | POA: Insufficient documentation

## 2017-03-09 DIAGNOSIS — E11649 Type 2 diabetes mellitus with hypoglycemia without coma: Secondary | ICD-10-CM | POA: Insufficient documentation

## 2017-03-09 DIAGNOSIS — E781 Pure hyperglyceridemia: Secondary | ICD-10-CM | POA: Insufficient documentation

## 2017-03-09 DIAGNOSIS — Z79890 Hormone replacement therapy (postmenopausal): Secondary | ICD-10-CM | POA: Insufficient documentation

## 2017-03-09 DIAGNOSIS — Z7951 Long term (current) use of inhaled steroids: Secondary | ICD-10-CM | POA: Insufficient documentation

## 2017-03-09 DIAGNOSIS — E039 Hypothyroidism, unspecified: Secondary | ICD-10-CM | POA: Insufficient documentation

## 2017-03-09 DIAGNOSIS — E119 Type 2 diabetes mellitus without complications: Secondary | ICD-10-CM

## 2017-03-09 DIAGNOSIS — Z7982 Long term (current) use of aspirin: Secondary | ICD-10-CM | POA: Insufficient documentation

## 2017-03-09 DIAGNOSIS — R300 Dysuria: Secondary | ICD-10-CM | POA: Insufficient documentation

## 2017-03-09 LAB — POCT URINALYSIS DIPSTICK
Bilirubin, UA: NEGATIVE
GLUCOSE UA: NEGATIVE
Ketones, UA: NEGATIVE
LEUKOCYTES UA: NEGATIVE
Nitrite, UA: NEGATIVE
Protein, UA: NEGATIVE
Spec Grav, UA: <=1.005 — AB (ref 1.010–1.025)
Urobilinogen, UA: 0.2 E.U./dL
pH, UA: 6 (ref 5.0–8.0)

## 2017-03-09 LAB — GLUCOSE, POCT (MANUAL RESULT ENTRY)
POC GLUCOSE: 88 mg/dL (ref 70–99)
POC Glucose: 63 mg/dl — AB (ref 70–99)

## 2017-03-09 LAB — POCT GLYCOSYLATED HEMOGLOBIN (HGB A1C): Hemoglobin A1C: 7.1

## 2017-03-09 MED ORDER — ATORVASTATIN CALCIUM 10 MG PO TABS: 10.0000 mg | ORAL_TABLET | Freq: Every day | ORAL | 5 refills | Status: DC

## 2017-03-09 MED ORDER — LISINOPRIL 40 MG PO TABS: 40.0000 mg | ORAL_TABLET | Freq: Every day | ORAL | 5 refills | Status: DC

## 2017-03-09 MED ORDER — METFORMIN HCL 1000 MG PO TABS: 1000.0000 mg | ORAL_TABLET | Freq: Two times a day (BID) | ORAL | 5 refills | Status: DC

## 2017-03-09 MED ORDER — GLIPIZIDE ER 10 MG PO TB24: 10.0000 mg | ORAL_TABLET | Freq: Every day | ORAL | 5 refills | Status: DC

## 2017-03-09 NOTE — Progress Notes (Signed)
93

## 2017-03-09 NOTE — Progress Notes (Signed)
Subjective:  Patient ID: Erin Huff, female    DOB: 05/07/1955  Age: 62 y.o. MRN: [id number]  CC: Diabetes and Follow-up   HPI Erin Huff  for follow up for DM and HTN. PMH of HTN, DM, and hypothyroidism. Interpreter services used.  History of DM. Symptoms: none. CBG's at home have ranged 120's to 140's at home. She denies any hypoglycemic episodes at home.  Patient denies foot ulcerations, nausea, paresthesia of the feet, visual disturbances and vomitting.  Evaluation to date has been included: fasting blood sugar, fasting lipid panel, hemoglobin A1C and microalbuminuria.   Treatment to date: Continued sulfonylurea and metformin that has been effective. History of hypertension. She is not exercising and is not adherent to low salt diet. She does not check BP at home. Cardiac symptoms none. Patient denies chest pain, chest pressure/discomfort, claudication, lower extremity edema, near-syncope, palpitations and syncope.  Cardiovascular risk factors: diabetes mellitus, dyslipidemia, hypertension and sedentary lifestyle. Use of agents associated with hypertension: none. History of target organ damage: none. History of hypothyroidism. Se denies any symptom. She reports adherence with levothyroxine use. She complains of intermittent burning with urination.  No c/o back pain, cloudy or foul smelling urine. She denies any vaginal discharge.    Outpatient Medications Prior to Visit  Medication Sig Dispense Refill  . aspirin EC 81 MG tablet Take 1 tablet (81 mg total) by mouth daily. 30 tablet 11  . Blood Glucose Monitoring Suppl (TRUE METRIX METER) w/Device KIT Use as directed 1 kit 0  . capsaicin (ZOSTRIX) 0.025 % cream Apply topically 2 (two) times daily. 60 g 0  . cetirizine (ZYRTEC) 10 MG tablet Take 1 tablet (10 mg total) by mouth daily. 30 tablet 2  . fluticasone (FLONASE) 50 MCG/ACT nasal spray Place 2 sprays into both nostrils daily. 16 g 2  . glucose blood (TRUE  METRIX BLOOD GLUCOSE TEST) test strip Use as instructed 100 each 12  . ibuprofen (ADVIL,MOTRIN) 600 MG tablet Take 1 tablet (600 mg total) by mouth every 8 (eight) hours as needed (Take with food.). 30 tablet 1  . levothyroxine (SYNTHROID, LEVOTHROID) 137 MCG tablet Take 1 tablet (137 mcg total) by mouth daily before breakfast. 90 tablet 0  . meclizine (ANTIVERT) 25 MG tablet Take 1 tablet (25 mg total) by mouth 3 (three) times daily as needed for dizziness. 30 tablet 0  . Multiple Vitamin (MULTIVITAMIN) tablet Take 1 tablet by mouth daily.    Marland Kitchen omega-3 acid ethyl esters (LOVAZA) 1 g capsule Take 1 capsule (1 g total) by mouth 2 (two) times daily. 60 capsule 3  . OVER THE COUNTER MEDICATION nutrilite carb blocker as needed- helps with constipation    . ranitidine (ZANTAC) 300 MG tablet Take 1 tablet (300 mg total) by mouth at bedtime. 30 tablet 2  . TRUEPLUS LANCETS 28G MISC Use as directed 100 each 5  . atorvastatin (LIPITOR) 10 MG tablet Take 1 tablet (10 mg total) by mouth daily at 6 PM. 90 tablet 0  . glipiZIDE (GLUCOTROL XL) 10 MG 24 hr tablet Take 1 tablet (10 mg total) by mouth daily with breakfast. 30 tablet 3  . lisinopril (PRINIVIL,ZESTRIL) 40 MG tablet Take 1 tablet (40 mg total) by mouth daily. 30 tablet 3  . metFORMIN (GLUCOPHAGE) 1000 MG tablet Take 1 tablet (1,000 mg total) by mouth 2 (two) times daily with a meal. 60 tablet 0   Facility-Administered Medications Prior to Visit  Medication Dose Route Frequency Provider Last Rate Last Dose  .  0.9 %  sodium chloride infusion  500 mL Intravenous Continuous Pyrtle, Lajuan Lines, MD        Review of Systems  Constitutional: Negative.   Eyes: Negative.   Respiratory: Negative.   Cardiovascular: Negative.   Genitourinary: Positive for dysuria.  Skin: Negative.   Psychiatric/Behavioral: Negative.      Objective:  BP 130/76 (BP Location: Left Arm, Patient Position: Sitting, Cuff Size: Large)   Pulse 79   Temp 98.7 F (37.1 C) (Oral)    Resp 18   Ht '5\' 2"'$  (1.575 m)   Wt 171 lb (77.6 kg)   SpO2 100%   BMI 31.28 kg/m   BP/Weight 03/09/2017 12/21/2016 15/40/0867  Systolic BP 619 509 326  Diastolic BP 76 83 84  Wt. (Lbs) 171 - 174.2  BMI 31.28 - 34.02    Physical Exam  Nursing note and vitals reviewed. Constitutional: She appears well-developed and well-nourished.  HENT:  Head: Normocephalic and atraumatic.  Right Ear: External ear normal.  Left Ear: External ear normal.  Nose: Nose normal.  Mouth/Throat: Oropharynx is clear and moist.  Eyes: Conjunctivae are normal. Pupils are equal, round, and reactive to light.  Neck: Normal range of motion. Neck supple. No JVD present. No thyromegaly present.  Cardiovascular: Normal rate, regular rhythm, normal heart sounds and intact distal pulses.  Respiratory: Effort normal and breath sounds normal.  GI: Soft. Bowel sounds are normal. There is no tenderness.  Psychiatric: She has a normal mood and affect.      Assessment & Plan:   1. Type 2 diabetes mellitus without complication, without long-term current use of insulin (HCC) Hypoglycemic in office. Patient reports only eating a salad with chicken for breakfast. Juice given.  Encouraged eating small snack throughout the day and checking CBG's BID. Dietary and exercise interventions discussed. - Glucose (CBG) - HgB A1c - glipiZIDE (GLUCOTROL XL) 10 MG 24 hr tablet; Take 1 tablet (10 mg total) by mouth daily with breakfast.  Dispense: 30 tablet; Refill: 3 - Ambulatory referral to Ophthalmology - metFORMIN (GLUCOPHAGE) 1000 MG tablet; Take 1 tablet (1,000 mg total) by mouth 2 (two) times daily with a meal.  Dispense: 60 tablet; Refill: 3  2. Needs flu shot  - Flu Vaccine QUAD 6+ mos PF IM (Fluarix Quad PF)  3. Dysuria  - Urinalysis Dipstick - fluconazole (DIFLUCAN) 150 MG tablet; Take 1 tablet (150 mg total) by mouth once.  Dispense: 1 tablet; Refill: 0  4. Hypertriglyceridemia  - omega-3 acid ethyl esters  (LOVAZA) 1 g capsule; Take 1 capsule (1 g total) by mouth 2 (two) times daily.  Dispense: 60 capsule; Refill: 3 - Lipid Panel  5. Essential hypertension  - lisinopril (PRINIVIL,ZESTRIL) 40 MG tablet; Take 1 tablet (40 mg total) by mouth daily.  Dispense: 30 tablet; Refill: 3  6. Dental caries  - Ambulatory referral to Dentistry  7. Hypothyroidism, unspecified type  - TSH    Follow-up: Return in about 2 weeks (around 03/23/2017) for PAP .   Alfonse Spruce FNP

## 2017-03-10 ENCOUNTER — Ambulatory Visit: Payer: Self-pay | Attending: Family Medicine

## 2017-03-10 DIAGNOSIS — E039 Hypothyroidism, unspecified: Secondary | ICD-10-CM | POA: Insufficient documentation

## 2017-03-10 MED FILL — glipiZIDE XL 10 MG TB24: 10 | 30 days supply | Qty: 30 | Fill #0

## 2017-03-10 MED FILL — ?ATORVASTATIN 10 MG TABLET: 10 | 30 days supply | Qty: 30 | Fill #0

## 2017-03-10 MED FILL — LISINOPRIL 40 MG TAB: 40 | 30 days supply | Qty: 30 | Fill #0

## 2017-03-10 NOTE — Progress Notes (Signed)
Patient here for lab visit only 

## 2017-03-11 LAB — TSH: TSH: 0.418 u[IU]/mL — ABNORMAL LOW (ref 0.450–4.500)

## 2017-03-15 ENCOUNTER — Other Ambulatory Visit: Payer: Self-pay | Admitting: Family Medicine

## 2017-03-15 DIAGNOSIS — N760 Acute vaginitis: Principal | ICD-10-CM

## 2017-03-15 DIAGNOSIS — B9689 Other specified bacterial agents as the cause of diseases classified elsewhere: Secondary | ICD-10-CM

## 2017-03-15 DIAGNOSIS — E039 Hypothyroidism, unspecified: Secondary | ICD-10-CM

## 2017-03-15 LAB — URINE CYTOLOGY ANCILLARY ONLY
Bacterial vaginitis: POSITIVE — AB
Candida vaginitis: NEGATIVE

## 2017-03-15 MED ORDER — METRONIDAZOLE 500 MG PO TABS: 500.0000 mg | ORAL_TABLET | Freq: Two times a day (BID) | ORAL | 0 refills | Status: DC

## 2017-03-15 MED ORDER — LEVOTHYROXINE SODIUM 125 MCG PO TABS: 125.0000 ug | ORAL_TABLET | Freq: Every day | ORAL | 0 refills | Status: DC

## 2017-03-20 ENCOUNTER — Telehealth (INDEPENDENT_AMBULATORY_CARE_PROVIDER_SITE_OTHER): Payer: Self-pay | Admitting: *Deleted

## 2017-03-20 NOTE — Telephone Encounter (Signed)
-----   Message from Lizbeth BarkMandesia R Hairston, FNP sent at 03/15/2017  3:43 PM EST ----- Bacterial vaginosis was positive. You will be prescribed BV is caused by an overgrowth of germs in the vagina. To reduce your risk of developing BV don't douche, don't use scented soap or sprays, and use protection during sexual intercourse. Yeast negative.

## 2017-03-20 NOTE — Telephone Encounter (Signed)
Medical Assistant used [employer] to contact patient.  Interpreter Name: Ruel Favorsdgar Interpreter #: [phone] Patient was not available, [employer] left patient a voicemail. Patient is aware of BV being positive and yeast being negative.Patient advised to not douche or use scented soaps and use protection during intercourse. Patient is aware of Thyroid levels showing thyroid is over functioning. Levothyroxine level will be decreased. Follow up in 2 months.

## 2017-03-22 ENCOUNTER — Ambulatory Visit (HOSPITAL_COMMUNITY)
Admission: EM | Admit: 2017-03-22 | Discharge: 2017-03-22 | Disposition: A | Discharge status: Home / Self Care | Payer: Self-pay | Attending: Family Medicine | Admitting: Family Medicine

## 2017-03-22 ENCOUNTER — Encounter (HOSPITAL_COMMUNITY): Payer: Self-pay | Admitting: Emergency Medicine

## 2017-03-22 DIAGNOSIS — E119 Type 2 diabetes mellitus without complications: Secondary | ICD-10-CM | POA: Insufficient documentation

## 2017-03-22 DIAGNOSIS — Z79899 Other long term (current) drug therapy: Secondary | ICD-10-CM | POA: Insufficient documentation

## 2017-03-22 DIAGNOSIS — Z880 Allergy status to penicillin: Secondary | ICD-10-CM | POA: Insufficient documentation

## 2017-03-22 DIAGNOSIS — Z79890 Hormone replacement therapy (postmenopausal): Secondary | ICD-10-CM | POA: Insufficient documentation

## 2017-03-22 DIAGNOSIS — R51 Headache: Secondary | ICD-10-CM

## 2017-03-22 DIAGNOSIS — J069 Acute upper respiratory infection, unspecified: Secondary | ICD-10-CM | POA: Insufficient documentation

## 2017-03-22 DIAGNOSIS — E782 Mixed hyperlipidemia: Secondary | ICD-10-CM | POA: Insufficient documentation

## 2017-03-22 DIAGNOSIS — E039 Hypothyroidism, unspecified: Secondary | ICD-10-CM | POA: Insufficient documentation

## 2017-03-22 DIAGNOSIS — K219 Gastro-esophageal reflux disease without esophagitis: Secondary | ICD-10-CM | POA: Insufficient documentation

## 2017-03-22 DIAGNOSIS — J029 Acute pharyngitis, unspecified: Secondary | ICD-10-CM | POA: Insufficient documentation

## 2017-03-22 DIAGNOSIS — F329 Major depressive disorder, single episode, unspecified: Secondary | ICD-10-CM | POA: Insufficient documentation

## 2017-03-22 DIAGNOSIS — I10 Essential (primary) hypertension: Secondary | ICD-10-CM | POA: Insufficient documentation

## 2017-03-22 DIAGNOSIS — Z7984 Long term (current) use of oral hypoglycemic drugs: Secondary | ICD-10-CM | POA: Insufficient documentation

## 2017-03-22 LAB — POCT RAPID STREP A: Streptococcus, Group A Screen (Direct): NEGATIVE

## 2017-03-22 MED ORDER — PHENOL 1.4 % MT LIQD: 1.0000 | OROMUCOSAL | 0 refills | Status: DC | PRN

## 2017-03-22 MED ORDER — FLUTICASONE PROPIONATE 50 MCG/ACT NA SUSP: 2.0000 | Freq: Every day | NASAL | 0 refills | Status: DC

## 2017-03-22 MED ORDER — CETIRIZINE HCL 10 MG PO TABS: 10.0000 mg | ORAL_TABLET | Freq: Every day | ORAL | 2 refills | Status: DC

## 2017-03-22 NOTE — ED Provider Notes (Signed)
Erin Huff    CSN: [id number] Arrival date & time: 03/22/17  4034     History   Chief Complaint Chief Complaint  Patient presents with  . 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 female history of hypertension, diabetes, hypertension hyperlipidemia, hypothyroid, Patient is presenting with URI symptoms- congestion, sore throat.  Denies cough and ear pain.  Also endorsing headache and watery eyes.  Patient's main complaints are her sore throat. Symptoms have been going on for 3 days. Patient has tried ibuprofen, with minimal relief. Denies fever, nausea, vomiting, diarrhea. Denies shortness of breath and chest pain.  Denies history of asthma.  Denies smoking history.  Eating and drinking without issue.   HPI  Past Medical History:  Diagnosis Date  . Allergy   . Depression    mild  . Diabetes mellitus without complication ([place])   . GERD (gastroesophageal reflux disease)   . Heart murmur   . Hyperlipidemia   . Hypertension   . Thyroid disease     Patient Active Problem List   Diagnosis Date Noted  . Mixed hyperlipidemia 12/20/2016  . Hypertension 03/01/2016  . Thrombocytopenia ([place]) 01/14/2015  . Hyponatremia 01/14/2015  . ALLERGIC RHINITIS 05/29/2009  . OBESITY 12/25/2008  . ACUTE CYSTITIS 02/26/2008  . SKIN RASH 11/21/2007  . Hypothyroidism 10/26/2007  . HYPERCHOLESTEROLEMIA 10/26/2007  . CARDIAC MURMUR 10/26/2007  . Diabetes mellitus ([place]) 04/20/2006  . HYPERTENSION, BENIGN SYSTEMIC 04/20/2006    Past Surgical History:  Procedure Laterality Date  . NO PAST SURGERIES      OB History    No data available       Home Medications    Prior to Admission medications   Medication Sig Start Date End Date Taking? Authorizing Provider  atorvastatin (LIPITOR) 10 MG tablet Take 1 tablet (10 mg total) by mouth daily at 6 PM. 03/09/17  Yes Hairston, Mandesia R, FNP  Blood Glucose Monitoring  Suppl (TRUE METRIX METER) w/Device KIT Use as directed 06/14/16  Yes Jegede, Olugbemiga E, MD  glipiZIDE (GLUCOTROL XL) 10 MG 24 hr tablet Take 1 tablet (10 mg total) by mouth daily with breakfast. 03/09/17  Yes Hairston, Mandesia R, FNP  glucose blood (TRUE METRIX BLOOD GLUCOSE TEST) test strip Use as instructed 06/14/16  Yes Jegede, Olugbemiga E, MD  ibuprofen (ADVIL,MOTRIN) 600 MG tablet Take 1 tablet (600 mg total) by mouth every 8 (eight) hours as needed (Take with food.). 09/14/16  Yes Hairston, Maylon Peppers, FNP  levothyroxine (SYNTHROID, LEVOTHROID) 125 MCG tablet Take 1 tablet (125 mcg total) by mouth daily before breakfast. 03/15/17  Yes Hairston, Mandesia R, FNP  lisinopril (PRINIVIL,ZESTRIL) 40 MG tablet Take 1 tablet (40 mg total) by mouth daily. 03/09/17  Yes Hairston, Mandesia R, FNP  capsaicin (ZOSTRIX) 0.025 % cream Apply topically 2 (two) times daily. 09/20/16   Alfonse Spruce, FNP  cetirizine (ZYRTEC) 10 MG tablet Take 1 tablet (10 mg total) by mouth daily for 10 days. Una pastilla cada dia por 10 dias 03/22/17 04/01/17  Eugene Isadore C, PA-C  fluticasone (FLONASE) 50 MCG/ACT nasal spray Place 2 sprays into both nostrils daily for 7 days. 03/22/17 03/29/17  Adelbert Gaspard C, PA-C  meclizine (ANTIVERT) 25 MG tablet Take 1 tablet (25 mg total) by mouth 3 (three) times daily as needed for dizziness. 09/26/16   Barnet Glasgow, NP  metFORMIN (GLUCOPHAGE) 1000 MG tablet Take 1 tablet (1,000 mg total) by mouth 2 (two)  times daily with a meal. 03/09/17   Hairston, Maylon Peppers, FNP  metroNIDAZOLE (FLAGYL) 500 MG tablet Take 1 tablet (500 mg total) by mouth 2 (two) times daily. 03/15/17   Alfonse Spruce, FNP  Multiple Vitamin (MULTIVITAMIN) tablet Take 1 tablet by mouth daily.    [provider]  omega-3 acid ethyl esters (LOVAZA) 1 g capsule Take 1 capsule (1 g total) by mouth 2 (two) times daily. 12/02/16   Alfonse Spruce, FNP  OVER THE COUNTER MEDICATION nutrilite carb blocker  as needed- helps with constipation    [provider]  phenol (CHLORASEPTIC) 1.4 % LIQD Use as directed 1 spray in the mouth or throat as needed for throat irritation / pain. 03/22/17   Leiloni Smithers C, PA-C  ranitidine (ZANTAC) 300 MG tablet Take 1 tablet (300 mg total) by mouth at bedtime. 09/26/16   Barnet Glasgow, NP  TRUEPLUS LANCETS 28G MISC Use as directed 06/14/16   Tresa Garter, MD    Family History Family History  Problem Relation Age of Onset  . Colon cancer Neg Hx   . Colon polyps Neg Hx   . Esophageal cancer Neg Hx   . Rectal cancer Neg Hx   . Stomach cancer Neg Hx     Social History Social History   Tobacco Use  . Smoking status: Never Smoker  . Smokeless tobacco: Never Used  Substance Use Topics  . Alcohol use: Yes    Comment: rare   . Drug use: No     Allergies   Penicillins   Review of Systems Review of Systems  Constitutional: Negative for chills, fatigue and fever.  HENT: Positive for congestion, rhinorrhea and sore throat. Negative for ear pain, sinus pressure and trouble swallowing.   Respiratory: Negative for cough, chest tightness and shortness of breath.   Cardiovascular: Negative for chest pain.  Gastrointestinal: Negative for abdominal pain, nausea and vomiting.  Musculoskeletal: Negative for myalgias.  Skin: Negative for rash.  Neurological: Negative for dizziness, light-headedness and headaches.     Physical Exam Triage Vital Signs ED Triage Vitals  Enc Vitals Group     BP 03/22/17 1013 (!) 157/84     Pulse Rate 03/22/17 1013 95     Resp 03/22/17 1013 16     Temp 03/22/17 1013 98.5 F (36.9 C)     Temp Source 03/22/17 1013 Oral     SpO2 03/22/17 1013 99 %     Weight --      Height --      Head Circumference --      Peak Flow --      Pain Score 03/22/17 1018 8     Pain Loc --      Pain Edu? --      Excl. in [place]? --    No data found.  Updated Vital Signs BP (!) 157/84 (BP Location: Left Arm) Comment:  reported BP to Dr Joseph Art  Pulse 95   Temp 98.5 F (36.9 C) (Oral)   Resp 16   SpO2 99%    Physical Exam  Constitutional: She appears well-developed and well-nourished. No distress.  HENT:  Head: Normocephalic and atraumatic.  Right Ear: Tympanic membrane and ear canal normal.  Left Ear: Tympanic membrane and ear canal normal.  Nose: Rhinorrhea present.  Mouth/Throat: Uvula is midline and mucous membranes are normal. No oral lesions. No trismus in the jaw. No uvula swelling. Posterior oropharyngeal erythema present. Tonsils are 1+ on the right. Tonsils  are 1+ on the left. No tonsillar exudate.  Eyes: Conjunctivae are normal.  Neck: Neck supple.  Cardiovascular: Normal rate and regular rhythm.  Pulmonary/Chest: Effort normal and breath sounds normal. No respiratory distress.  Clear to auscultation bilaterally  Abdominal: Soft. There is no tenderness.  Musculoskeletal: She exhibits no edema.  Neurological: She is alert.  Skin: Skin is warm and dry.  Psychiatric: She has a normal mood and affect.  Nursing note and vitals reviewed.    UC Treatments / Results  Labs (all labs ordered are listed, but only abnormal results are displayed) Labs Reviewed  CULTURE, GROUP A STREP [facility])  POCT RAPID STREP A    EKG  EKG Interpretation None       Radiology No results found.  Procedures Procedures (including critical care time)  Medications Ordered in UC Medications - No data to display   Initial Impression / Assessment and Plan / UC Course  I have reviewed the triage vital signs and the nursing notes.  Pertinent labs & imaging results that were available during my care of the patient were reviewed by me and considered in my medical decision making (see chart for details).    Patient tested negative for strep. No evidence of peritonsillar abscess or retropharyngeal abscess. Patient is nontoxic appearing, no drooling, dysphagia, muffled voice, or tripoding. No trismus.     Patient presents with symptoms likely from a viral upper respiratory infection.  Vital signs stable without fever, tachycardia, O2 99%.  Do not suspect underlying cardiopulmonary process. Symptoms seem unlikely related to ACS, CHF or COPD exacerbations, pneumonia, pneumothorax. Patient is nontoxic appearing and not in need of emergent medical intervention.   Recommended symptom control with over the counter medications: Daily oral anti-histamine, Oral decongestant or IN corticosteroid, saline irrigations, cepacol lozenges, Robitussin, Delsym, honey tea. Chlorseptic spray for throat.  Return if symptoms fail to improve in 1-2 weeks or you develop shortness of breath, chest pain, severe headache. Patient states understanding and is agreeable.  Final Clinical Impressions(s) / UC Diagnoses   Final diagnoses:  Sore throat  Upper respiratory tract infection, unspecified type    ED Discharge Orders        Ordered    phenol (CHLORASEPTIC) 1.4 % LIQD  As needed     03/22/17 1052    cetirizine (ZYRTEC) 10 MG tablet  Daily     03/22/17 1101    fluticasone (FLONASE) 50 MCG/ACT nasal spray  Daily     03/22/17 1101       Controlled Substance Prescriptions [place] Controlled Substance Registry consulted? Not Applicable   Janith Lima, [place] 03/22/17 1108

## 2017-03-22 NOTE — ED Triage Notes (Signed)
PT C/O: sore throat onset 3 days associated w/facial pressure  DENIES: fevers   TAKING MEDS: Ibuprofen   A&O x4... NAD... Ambulatory

## 2017-03-22 NOTE — Discharge Instructions (Signed)
Sore Throat  Your rapid strep tested Negative today. We will send for a culture and call in about 2 days if results are positive. For now we will treat your sore throat as a virus with symptom management.   Please continue Tylenol or Ibuprofen for fever and pain. May try salt water gargles, cepacol lozenges, throat spray, or OTC cold relief medicine for throat discomfort. If you also have congestion take a daily anti-histamine like Zyrtec, Claritin, and a oral decongestant to help with post nasal drip that may be irritating your throat.   Stay hydrated and drink plenty of fluids to keep your throat coated to relieve irritation.    [place] For sore throat try using a honey-based tea. Use 3 teaspoons of honey with juice squeezed from half lemon. Place shaved pieces of ginger into 1/2-1 cup of water and warm over stove top. Then mix the ingredients and repeat every 4 hours as needed.

## 2017-03-26 LAB — CULTURE, GROUP A STREP (THRC)

## 2017-03-31 ENCOUNTER — Ambulatory Visit: Payer: Self-pay | Attending: Family Medicine | Admitting: Family Medicine

## 2017-03-31 VITALS — BP 130/81 | HR 80 | Temp 98.4°F | Ht 60.0 in | Wt 168.2 lb

## 2017-03-31 DIAGNOSIS — B009 Herpesviral infection, unspecified: Secondary | ICD-10-CM | POA: Insufficient documentation

## 2017-03-31 DIAGNOSIS — Z7984 Long term (current) use of oral hypoglycemic drugs: Secondary | ICD-10-CM | POA: Insufficient documentation

## 2017-03-31 DIAGNOSIS — I10 Essential (primary) hypertension: Secondary | ICD-10-CM

## 2017-03-31 DIAGNOSIS — E119 Type 2 diabetes mellitus without complications: Secondary | ICD-10-CM

## 2017-03-31 DIAGNOSIS — Z01419 Encounter for gynecological examination (general) (routine) without abnormal findings: Secondary | ICD-10-CM

## 2017-03-31 DIAGNOSIS — Z79899 Other long term (current) drug therapy: Secondary | ICD-10-CM | POA: Insufficient documentation

## 2017-03-31 DIAGNOSIS — Z113 Encounter for screening for infections with a predominantly sexual mode of transmission: Secondary | ICD-10-CM

## 2017-03-31 DIAGNOSIS — R300 Dysuria: Secondary | ICD-10-CM

## 2017-03-31 LAB — POCT URINALYSIS DIPSTICK
BILIRUBIN UA: NEGATIVE
Clarity, UA: NEGATIVE
Glucose, UA: NEGATIVE
KETONES UA: NEGATIVE
Leukocytes, UA: NEGATIVE
Nitrite, UA: NEGATIVE
PH UA: 6.5 (ref 5.0–8.0)
Protein, UA: NEGATIVE
RBC UA: NEGATIVE
Spec Grav, UA: <=1.005 — AB (ref 1.010–1.025)
Urobilinogen, UA: 0.2 E.U./dL

## 2017-03-31 LAB — POCT GLYCOSYLATED HEMOGLOBIN (HGB A1C): Hemoglobin A1C: 6.9

## 2017-03-31 LAB — GLUCOSE, POCT (MANUAL RESULT ENTRY): POC Glucose: 102 mg/dl — AB (ref 70–99)

## 2017-03-31 MED ORDER — LISINOPRIL 10 MG PO TABS: 10.0000 mg | ORAL_TABLET | Freq: Every day | ORAL | 2 refills | Status: DC

## 2017-03-31 NOTE — Progress Notes (Signed)
Subjective:  Patient ID: Erin Huff, female    DOB: 1955-09-03  Age: 62 y.o. MRN: [id number]  CC: Gynecologic Exam   HPI Bryn Luis-Altamirano presents for well woman visit with pap. Interpreter services used. She denies family history of breast cancer. She denies family history of  gynecological cancers. She is not a current smoker.  She denies any lumps, denting or dimpling of the breast, or nipple discharge. She does not perform monthly SBE. She denies vaginal lesions, discharge. She does report symptoms of dysuria. She is agreeable to STI testing with pap.      Outpatient Medications Prior to Visit  Medication Sig Dispense Refill  . atorvastatin (LIPITOR) 10 MG tablet Take 1 tablet (10 mg total) by mouth daily at 6 PM. 30 tablet 5  . Blood Glucose Monitoring Suppl (TRUE METRIX METER) w/Device KIT Use as directed 1 kit 0  . glipiZIDE (GLUCOTROL XL) 10 MG 24 hr tablet Take 1 tablet (10 mg total) by mouth daily with breakfast. 30 tablet 5  . glucose blood (TRUE METRIX BLOOD GLUCOSE TEST) test strip Use as instructed 100 each 12  . levothyroxine (SYNTHROID, LEVOTHROID) 125 MCG tablet Take 1 tablet (125 mcg total) by mouth daily before breakfast. 90 tablet 0  . metFORMIN (GLUCOPHAGE) 1000 MG tablet Take 1 tablet (1,000 mg total) by mouth 2 (two) times daily with a meal. 60 tablet 5  . TRUEPLUS LANCETS 28G MISC Use as directed 100 each 5  . lisinopril (PRINIVIL,ZESTRIL) 40 MG tablet Take 1 tablet (40 mg total) by mouth daily. 30 tablet 5  . capsaicin (ZOSTRIX) 0.025 % cream Apply topically 2 (two) times daily. (Patient not taking: Reported on 03/31/2017) 60 g 0  . cetirizine (ZYRTEC) 10 MG tablet Take 1 tablet (10 mg total) by mouth daily for 10 days. Una pastilla cada dia por 10 dias (Patient not taking: Reported on 03/31/2017) 30 tablet 2  . fluticasone (FLONASE) 50 MCG/ACT nasal spray Place 2 sprays into both nostrils daily for 7 days. (Patient not taking: Reported on  03/31/2017) 1 g 0  . ibuprofen (ADVIL,MOTRIN) 600 MG tablet Take 1 tablet (600 mg total) by mouth every 8 (eight) hours as needed (Take with food.). (Patient not taking: Reported on 03/31/2017) 30 tablet 1  . meclizine (ANTIVERT) 25 MG tablet Take 1 tablet (25 mg total) by mouth 3 (three) times daily as needed for dizziness. (Patient not taking: Reported on 03/31/2017) 30 tablet 0  . metroNIDAZOLE (FLAGYL) 500 MG tablet Take 1 tablet (500 mg total) by mouth 2 (two) times daily. (Patient not taking: Reported on 03/31/2017) 14 tablet 0  . Multiple Vitamin (MULTIVITAMIN) tablet Take 1 tablet by mouth daily.    Marland Kitchen omega-3 acid ethyl esters (LOVAZA) 1 g capsule Take 1 capsule (1 g total) by mouth 2 (two) times daily. (Patient not taking: Reported on 03/31/2017) 60 capsule 3  . OVER THE COUNTER MEDICATION nutrilite carb blocker as needed- helps with constipation    . phenol (CHLORASEPTIC) 1.4 % LIQD Use as directed 1 spray in the mouth or throat as needed for throat irritation / pain. (Patient not taking: Reported on 03/31/2017) 20 mL 0  . ranitidine (ZANTAC) 300 MG tablet Take 1 tablet (300 mg total) by mouth at bedtime. (Patient not taking: Reported on 03/31/2017) 30 tablet 2   Facility-Administered Medications Prior to Visit  Medication Dose Route Frequency Provider Last Rate Last Dose  . 0.9 %  sodium chloride infusion  500 mL Intravenous Continuous Pyrtle,  Lajuan Lines, MD        ROS Review of Systems  Constitutional: Negative.   Respiratory: Negative.   Cardiovascular: Negative.   Gastrointestinal: Negative.   Genitourinary: Positive for dysuria.  Skin: Negative.   Psychiatric/Behavioral: Negative.    Objective:  BP 130/81 (BP Location: Right Arm, Patient Position: Sitting, Cuff Size: Small)   Pulse 80   Temp 98.4 F (36.9 C) (Oral)   Ht 5' (1.524 m)   Wt 168 lb 3.2 oz (76.3 kg)   SpO2 97%   BMI 32.85 kg/m   BP/Weight 03/31/2017 03/22/2017 6/89/3406  Systolic BP 840 335 331  Diastolic BP 81 84 76  Wt.  (Lbs) 168.2 - 171  BMI 32.85 - 31.28     Physical Exam  Constitutional: She appears well-developed and well-nourished.  Cardiovascular: Normal rate, regular rhythm, normal heart sounds and intact distal pulses.  Pulmonary/Chest: Effort normal and breath sounds normal. Right breast exhibits no mass, no nipple discharge, no skin change and no tenderness. Left breast exhibits no mass, no nipple discharge, no skin change and no tenderness.  Abdominal: Soft. Bowel sounds are normal. There is no tenderness.  Genitourinary: Vagina normal. No vaginal discharge found.  Skin: Skin is warm and dry.  Nursing note and vitals reviewed.   Assessment & Plan:   1. Well woman exam with routine gynecological exam  - Cytology - PAP([place]) - Cervicovaginal ancillary only  2. Type 2 diabetes mellitus without complication, without long-term current use of insulin (HCC)  - Glucose (CBG) - HgB A1c  3. Screening for STDs (sexually transmitted diseases)  - Cytology - PAP([place]) - Cervicovaginal ancillary only - HEP, RPR, HIV Panel - HSV(herpes simplex vrs) 1+2 ab-IgG  4. Dysuria  - Urinalysis, dipstick only - Urinalysis Dipstick  5. Essential hypertension She reports she has only been taking 10 mg of lisinopril instead of 30 mg as prescribed. D/c previous dosages of lisinopril.  Start lisinopril 10 mg QD. - lisinopril (PRINIVIL,ZESTRIL) 10 MG tablet; Take 1 tablet (10 mg total) by mouth daily.  Dispense: 30 tablet; Refill: 2       Follow-up: Return in about 3 months (around 06/28/2017) for DM.   Alfonse Spruce FNP

## 2017-03-31 NOTE — Patient Instructions (Signed)
Autoexamen de [employer] (Breast Self-Awareness) Autoexaminarse las mamas significa familiarizarse con el aspecto y la sensacin de las mamas al tacto. Incluye revisarse las mamas habitualmente e informarle al mdico acerca de cualquier cambio. Es importante autoexaminarse las [place]. Un cambio en las mamas puede ser un signo de un problema mdico grave. Familiarizarse con el aspecto de las mamas y con cmo se sienten al tacto Manufacturing systems engineer los problemas rpidamente, cuando es ms probable que el tratamiento resulte eficaz. Todas las mujeres deben autoexaminarse las [place], incluso aquellas que se sometieron a implantes mamarios. [place] forma de aprender qu es normal para sus mamas y si sufren modificaciones es Field seismologist un autoexamen. Para hacer un autoexamen de las mamas: Busque cambios 1. Qutese toda la ropa por encima de la cintura. 2. Prese frente a un espejo en una habitacin con buena iluminacin. 3. Apoye las manos en las caderas. 4. Empuje con fuerza hacia abajo con las manos. 5. [place] en el espejo. Busque diferencias entre ellas (asimetra), por ejemplo:  Diferencias en la forma.  Diferencias en el tamao.  Pliegues, depresiones y ndulos en [place], y no en la otra. 1. Observe cada mama para buscar cambios en la piel, por ejemplo:  Enrojecimiento.  Zonas escamosas. 1. Observe si hay cambios en los pezones, por ejemplo:  Secrecin.  Hemorragia.  Hoyuelos.  Enrojecimiento.  Un cambio en la posicin. Plpese para detectar si hay cambios Plpese las mamas con cuidado para detectar ndulos y [place]. Lo mejor es hacerlo mientras est acostada boca arriba en el piso y nuevamente mientras est sentada o de pie en la ducha o la baera con agua jabonosa en la piel. Plpese cada mama de la siguiente forma:  Coloque el brazo del lado de la mama que se examina por arriba de la cabeza.  Plpese la mama con la [place].  Comience en  la zona del pezn y haga crculos superpuestos de de pulgada (2cm). Para hacerlo, use las yemas de los tres dedos del [place]. Ejerza una presin [place], luego mediana y [place]. La presin Industrial/product designer el tejido ms cercano a la piel. La presin mediana le permitir palpar el tejido que est un poco ms profundo. La presin Advertising account executive el tejido ms cercano a las costillas.  Continuar superponiendo crculos y vaya hacia abajo, hasta sentir las [place], por debajo del [place].  Desplcese a una distancia del ancho de un dedo hacia el centro del cuerpo. Siga con los crculos superpuestos de de pulgada (2cm) para palpar la mama, mientras asciende lentamente hacia la clavcula.  Contine con el examen hacia arriba y [place] abajo con las tres presiones, Librarian, academic a Insurance risk surveyor. Anote sus hallazgos Anote qu es normal para cada mama y los cambios que encuentre. Debe llevar un registro escrito con los cambios o los hallazgos normales que encuentre para cada seno. Si registra esta informacin, no tiene Cabin crew solo de la memoria para Financial trader, la sensibilidad o la ubicacin de los [place]. Anote en qu momento se encuentra del ciclo menstrual, si usted todava est menstruando. Si tiene dificultad para Chiropractor, no se desaliente. Con el tiempo, se familiarizar con las variaciones de las mamas y se sentir ms cmoda con Visual merchandiser. CON QU FRECUENCIA DEBO EXAMINARME LAS MAMAS? Examnese las [employer]. Si est amamantando, el mejor momento para examinarse las mamas es despus de Economist o  de Manufacturing engineerusar un sacaleches. Si menstra, el mejor momento para examinarse las [place]mamas es 5 a 7das despus de finalizado el perodo menstrual. Durante la Rosendalemenstruacin, las mamas estn abultadas, y tal vez sea ms difcil percibir los [place]cambios. CUNDO DEBO CONTACTAR A MI MDICO? Consulte al mdico si percibe lo siguiente:  Un cambio en la  forma o el tamao de las [address] Drmamas o los pezones.  Un cambio en la piel de las mamas o los pezones, como la piel enrojecida o escamosa.  Una secrecin anormal en los pezones.  Un ndulo o una zona engrosada que no tena antes.  Dolor de [place].  Cualquier cosa que la preocupe. Esta informacin no tiene Theme park managercomo fin reemplazar el consejo del mdico. Asegrese de hacerle al mdico cualquier pregunta que tenga. Document Released: 02/07/2005 Document Revised: 06/01/2015 Document Reviewed: 12/28/2014 Elsevier Interactive Patient Education  2018 [employer]vier Inc.     Prueba de Papanicolaou (Pap Test) POR QU ME DEBO REALIZAR ESTA PRUEBA? A esta prueba tambin se la denomina "frotis de Pap". Es una prueba de deteccin que se [place] ([place]) Islandsutiliza para Engineer, manufacturingdetectar signos de cncer de vagina, cuello del tero y tero. La prueba tambin puede identificar la presencia de infeccin o cambios precancerosos. El mdico probablemente le recomiende que se realice esta prueba en forma regular. Esta prueba puede realizarse de la siguiente manera:  Cada 3 aos, a partir de los [address] aos.  Cada 5 aos, en combinacin con las pruebas que se realizan para Landscape architectdetectar la presencia del virus del Geneticist, molecularpapiloma humano (VPH).  Con mayor o menor frecuencia, en funcin de otras enfermedades que tenga. QU TIPO DE MUESTRA SE TOMA? El mdico utilizar un pequeo hisopo de algodn, una esptula de plstico o un cepillo para Landscape architectrecolectar una muestra de clulas de la superficie del cuello del tero. El cuello del tero es la apertura del tero, que tambin se conoce como matriz. Tambin pueden recolectarse las secreciones del cuello del tero y la vagina. CMO DEBO PREPARARME PARA ESTA PRUEBA?  Tenga en cuenta en qu etapa del ciclo menstrual se encuentra. Es posible que Human resources officerdeba reprogramar la prueba si est Magazine features editormenstruando el da en que debe realizrsela.  Si el da en que debe realizarse la prueba tiene una infeccin vaginal aparente, deber reprogramar la  prueba.  Pueden pedirle que evite tomar una ducha o bao el da de la prueba o el da anterior.  Algunos medicamentos pueden The ServiceMaster Companyprovocar resultados anormales de la prueba, como los digitlicos y Regulatory affairs officerla tetraciclina. Si toma alguno de Ford Motor Companyestos medicamentos, hable con su mdico antes de Smurfit-Stone Containerrealizarse la prueba. QU SIGNIFICAN LOS RESULTADOS? Los Cox Communicationsresultados anormales de la prueba pueden indicar diversas enfermedades. Estas pueden incluir lo siguiente:  Cncer. Si bien los resultados de la prueba de Papanicolaou no pueden utilizarse para Consulting civil engineerdiagnosticar cncer de cuello del tero, de vagina o de tero, pueden indicar que existe una posibilidad de presencia de cncer. En Maurilio Lovelyeste caso, ser necesario realizar pruebas adicionales para determinar la presencia de cncer.  Enfermedad de transmisin sexual.  Infecciones por hongos.  Infeccin por parsitos.  Infeccin por herpes.  Una enfermedad que causa o favorece la infertilidad. Es su responsabilidad retirar el resultado del Nashportestudio. Consulte en el laboratorio o en el departamento en el que fue realizado el estudio cundo y cmo podr Starbucks Corporationobtener los resultados. Comunquese con el mdico si tiene Smith Internationalpreguntas sobre los resultados. Esta informacin no tiene Theme park managercomo fin reemplazar el consejo del mdico. Asegrese de hacerle al mdico cualquier pregunta que tenga. Document Released: 07/27/2007 Document Revised:  Elsevier Interactive Patient Education  2018 Elsevier Inc.  

## 2017-04-01 LAB — HSV(HERPES SIMPLEX VRS) I + II AB-IGG
HSV 1 Glycoprotein G Ab, IgG: 44.7 index — ABNORMAL HIGH (ref 0.00–0.90)
HSV 2 IgG, Type Spec: <0.91 index (ref 0.00–0.90)

## 2017-04-01 LAB — HEP, RPR, HIV PANEL
HEP B S AG: NEGATIVE
HIV Screen 4th Generation wRfx: NONREACTIVE
RPR Ser Ql: NONREACTIVE

## 2017-04-03 ENCOUNTER — Telehealth: Payer: Self-pay | Admitting: Family Medicine

## 2017-04-03 DIAGNOSIS — E039 Hypothyroidism, unspecified: Secondary | ICD-10-CM

## 2017-04-03 MED FILL — LISINOPRIL 10 MG TABS: 10 | 30 days supply | Qty: 30 | Fill #0

## 2017-04-03 MED FILL — ?METFORMIN HCL 1,000 MG TAB: 1000 | 30 days supply | Qty: 60 | Fill #0

## 2017-04-03 MED FILL — glipiZIDE ER 10 MG TB24: 10 | 30 days supply | Qty: 30 | Fill #1

## 2017-04-03 NOTE — Telephone Encounter (Signed)
Pt called to request a refill for levothyroxine (SYNTHROID, LEVOTHROID) 125 MCG tablet  [facility]

## 2017-04-04 LAB — CERVICOVAGINAL ANCILLARY ONLY
BACTERIAL VAGINITIS: NEGATIVE
CANDIDA VAGINITIS: NEGATIVE
CHLAMYDIA, DNA PROBE: NEGATIVE
Neisseria Gonorrhea: NEGATIVE
Trichomonas: NEGATIVE

## 2017-04-04 MED ORDER — LEVOTHYROXINE SODIUM 125 MCG PO TABS: 125.0000 ug | ORAL_TABLET | Freq: Every day | ORAL | 0 refills | Status: DC

## 2017-04-04 MED FILL — ?LEVOTHYROXINE 125 MCG TABL: 125 | 30 days supply | Qty: 30 | Fill #0

## 2017-04-04 NOTE — Telephone Encounter (Signed)
Refilled

## 2017-04-05 ENCOUNTER — Telehealth (INDEPENDENT_AMBULATORY_CARE_PROVIDER_SITE_OTHER): Payer: Self-pay | Admitting: *Deleted

## 2017-04-05 ENCOUNTER — Telehealth: Payer: Self-pay | Admitting: Family Medicine

## 2017-04-05 LAB — CYTOLOGY - PAP: HPV (WINDOPATH): DETECTED — AB

## 2017-04-05 NOTE — Telephone Encounter (Signed)
Pt. Returned nurse call and was informed of lab results.

## 2017-04-05 NOTE — Telephone Encounter (Signed)
Medical Assistant used [employer] to contact patient.  Interpreter Name: Romelle StarcherDonicia Interpreter #: [phone] Patient is aware of all labs being negative.  Patient is aware of cold sore herpes being positive and to refrain from kissing during outbreaks. Medical Assistant left message on patient's home and cell voicemail. Voicemail states to give a call back to [place] [facility] at [phone].

## 2017-04-05 NOTE — Telephone Encounter (Signed)
-----   Message from Lizbeth BarkMandesia R Hairston, FNP sent at 04/05/2017 11:50 AM EST ----- HIV, Hepatitis B, and syphilis are all negative. Herpes type 1 which is primarily responsible for cold sores is positive. When you have cold sores do not kiss anyone, share utensils, or have oral sex. Genital Herpes, Gonorrhea, Chlamydia,BV, Yeast, and Trichomonas were all negative. Diabetes screening is at goal. Continue current diabetic medications.

## 2017-04-07 ENCOUNTER — Other Ambulatory Visit: Payer: Self-pay | Admitting: Family Medicine

## 2017-04-07 DIAGNOSIS — R87612 Low grade squamous intraepithelial lesion on cytologic smear of cervix (LGSIL): Secondary | ICD-10-CM

## 2017-04-10 ENCOUNTER — Other Ambulatory Visit: Payer: Self-pay | Admitting: Family Medicine

## 2017-04-10 ENCOUNTER — Telehealth: Payer: Self-pay | Admitting: Family Medicine

## 2017-04-10 ENCOUNTER — Encounter (HOSPITAL_COMMUNITY): Payer: Self-pay | Admitting: Emergency Medicine

## 2017-04-10 ENCOUNTER — Ambulatory Visit (HOSPITAL_COMMUNITY)
Admission: EM | Admit: 2017-04-10 | Discharge: 2017-04-10 | Disposition: A | Discharge status: Home / Self Care | Payer: Self-pay

## 2017-04-10 DIAGNOSIS — J309 Allergic rhinitis, unspecified: Secondary | ICD-10-CM

## 2017-04-10 DIAGNOSIS — J0110 Acute frontal sinusitis, unspecified: Secondary | ICD-10-CM

## 2017-04-10 DIAGNOSIS — Z9109 Other allergy status, other than to drugs and biological substances: Secondary | ICD-10-CM

## 2017-04-10 LAB — CERVICOVAGINAL ANCILLARY ONLY: Herpes: NEGATIVE

## 2017-04-10 MED ORDER — LORATADINE 10 MG PO TABS: 10.0000 mg | ORAL_TABLET | Freq: Every day | ORAL | 11 refills | Status: DC

## 2017-04-10 MED ORDER — DOXYCYCLINE HYCLATE 100 MG PO CAPS: 100.0000 mg | ORAL_CAPSULE | Freq: Two times a day (BID) | ORAL | 0 refills | Status: DC

## 2017-04-10 NOTE — ED Provider Notes (Addendum)
MRN: [id number] DOB: 1955-07-25  Subjective:   Erin Huff is a 62 y.o. female presenting for 2 day history of sore throat, nasal congestion, sinus pain. Has tried Flonase with minimal relief. Has had seasonal allergies, ran out of her Claritin. Denies history of asthma.   Review of Systems  Constitutional: Negative for fever.  HENT: Negative for ear discharge and ear pain.   Respiratory: Negative for cough and shortness of breath.   Cardiovascular: Negative for chest pain.  Gastrointestinal: Negative for abdominal pain, nausea and vomiting.  Skin: Negative for rash.     Current Facility-Administered Medications:  .  0.9 %  sodium chloride infusion, 500 mL, Intravenous, Continuous, Pyrtle, Lajuan Lines, MD  Current Outpatient Medications:  .  fexofenadine (ALLEGRA) 180 MG tablet, Take 180 mg by mouth daily., Disp: , Rfl:  .  atorvastatin (LIPITOR) 10 MG tablet, Take 1 tablet (10 mg total) by mouth daily at 6 PM., Disp: 30 tablet, Rfl: 5 .  Blood Glucose Monitoring Suppl (TRUE METRIX METER) w/Device KIT, Use as directed, Disp: 1 kit, Rfl: 0 .  capsaicin (ZOSTRIX) 0.025 % cream, Apply topically 2 (two) times daily. (Patient not taking: Reported on 03/31/2017), Disp: 60 g, Rfl: 0 .  cetirizine (ZYRTEC) 10 MG tablet, Take 1 tablet (10 mg total) by mouth daily for 10 days. Una pastilla cada dia por 10 dias (Patient not taking: Reported on 03/31/2017), Disp: 30 tablet, Rfl: 2 .  doxycycline (VIBRAMYCIN) 100 MG capsule, Take 1 capsule (100 mg total) by mouth 2 (two) times daily., Disp: 20 capsule, Rfl: 0 .  fluticasone (FLONASE) 50 MCG/ACT nasal spray, Place 2 sprays into both nostrils daily for 7 days. (Patient not taking: Reported on 03/31/2017), Disp: 1 g, Rfl: 0 .  glipiZIDE (GLUCOTROL XL) 10 MG 24 hr tablet, Take 1 tablet (10 mg total) by mouth daily with breakfast., Disp: 30 tablet, Rfl: 5 .  glucose blood (TRUE METRIX BLOOD GLUCOSE TEST) test strip, Use as instructed, Disp: 100 each, Rfl:  12 .  ibuprofen (ADVIL,MOTRIN) 600 MG tablet, Take 1 tablet (600 mg total) by mouth every 8 (eight) hours as needed (Take with food.). (Patient not taking: Reported on 03/31/2017), Disp: 30 tablet, Rfl: 1 .  levothyroxine (SYNTHROID, LEVOTHROID) 125 MCG tablet, Take 1 tablet (125 mcg total) by mouth daily before breakfast., Disp: 90 tablet, Rfl: 0 .  lisinopril (PRINIVIL,ZESTRIL) 10 MG tablet, Take 1 tablet (10 mg total) by mouth daily., Disp: 30 tablet, Rfl: 2 .  loratadine (CLARITIN) 10 MG tablet, Take 1 tablet (10 mg total) by mouth daily., Disp: 30 tablet, Rfl: 11 .  meclizine (ANTIVERT) 25 MG tablet, Take 1 tablet (25 mg total) by mouth 3 (three) times daily as needed for dizziness. (Patient not taking: Reported on 03/31/2017), Disp: 30 tablet, Rfl: 0 .  metFORMIN (GLUCOPHAGE) 1000 MG tablet, Take 1 tablet (1,000 mg total) by mouth 2 (two) times daily with a meal., Disp: 60 tablet, Rfl: 5 .  metroNIDAZOLE (FLAGYL) 500 MG tablet, Take 1 tablet (500 mg total) by mouth 2 (two) times daily. (Patient not taking: Reported on 03/31/2017), Disp: 14 tablet, Rfl: 0 .  Multiple Vitamin (MULTIVITAMIN) tablet, Take 1 tablet by mouth daily., Disp: , Rfl:  .  omega-3 acid ethyl esters (LOVAZA) 1 g capsule, Take 1 capsule (1 g total) by mouth 2 (two) times daily. (Patient not taking: Reported on 03/31/2017), Disp: 60 capsule, Rfl: 3 .  OVER THE COUNTER MEDICATION, nutrilite carb blocker as needed- helps with constipation,  Disp: , Rfl:  .  phenol (CHLORASEPTIC) 1.4 % LIQD, Use as directed 1 spray in the mouth or throat as needed for throat irritation / pain. (Patient not taking: Reported on 03/31/2017), Disp: 20 mL, Rfl: 0 .  ranitidine (ZANTAC) 300 MG tablet, Take 1 tablet (300 mg total) by mouth at bedtime. (Patient not taking: Reported on 03/31/2017), Disp: 30 tablet, Rfl: 2 .  TRUEPLUS LANCETS 28G MISC, Use as directed, Disp: 100 each, Rfl: 5   Erin Huff is allergic to penicillins.  Erin Huff  has a past medical history  of Allergy, Depression, Diabetes mellitus without complication ([place]), GERD (gastroesophageal reflux disease), Heart murmur, Hyperlipidemia, Hypertension, and Thyroid disease. Also  has a past surgical history that includes No past surgeries.  Objective:   Vitals: BP 137/64   Pulse 81   Temp 99 F (37.2 C) (Oral)   Resp 16   Wt 168 lb (76.2 kg)   SpO2 100%   BMI 32.81 kg/m   Physical Exam  Constitutional: She is oriented to person, place, and time. She appears well-developed and well-nourished.  HENT:  TM's intact bilaterally, no effusions or erythema. Nasal turbinates erythematous, moist, nasal passages minimally patent. Frontal sinus tenderness. Oropharynx clear, mucous membranes moist.   Eyes: Right eye exhibits no discharge. Left eye exhibits no discharge.  Cardiovascular: Normal rate.  Pulmonary/Chest: Effort normal.  Neurological: She is alert and oriented to person, place, and time.   Assessment and Plan :   Acute non-recurrent frontal sinusitis  Environmental allergies  Will manage with doxycycline. Restart Claritin. Hydrate well. Return-to-clinic precautions discussed, patient verbalized understanding.    Erin Eagles, PA-C 04/10/17 1426

## 2017-04-10 NOTE — ED Triage Notes (Signed)
Interpreter used for triage.  

## 2017-04-10 NOTE — Telephone Encounter (Signed)
[facility]

## 2017-04-10 NOTE — Telephone Encounter (Signed)
She says she wants a Script for Loratadine 10 mg because it helped her the most when she was taking it a year ago. She says Cetrizine is not helping her at all  Please follow up

## 2017-04-10 NOTE — Discharge Instructions (Signed)
Para el dolor de garganta intente usar un t de miel. Use 3 cucharaditas de miel con jugo exprimido de CBS Corporationmedio limn. Coloque las piezas de Bulgariajengibre afeitadas en 1/2 - 1 taza de agua y caliente sobre la estufa. Luego mezcle los ingredientes y repita cada 4 horas. Tome 1 galon de agua al dia.

## 2017-04-10 NOTE — Telephone Encounter (Signed)
Pt came in to request Loratadine 10MG  Because she is suffering allergy symptoms  [facility]

## 2017-04-10 NOTE — Telephone Encounter (Signed)
Loratadine is not apart of her current medication list. It looks like she was recently prescribed cetrizine for allergies. Does she want a refill of the cetrizine or script for loratadine?

## 2017-04-10 NOTE — ED Triage Notes (Signed)
   PT reports Headache, runny nose, no cough,  And dizziness.   Symptoms started Saturday. Symptoms worsened yesterday.

## 2017-04-13 ENCOUNTER — Ambulatory Visit: Payer: Self-pay | Attending: Family Medicine | Admitting: Physician Assistant

## 2017-04-13 VITALS — BP 141/80 | HR 94 | Temp 98.2°F | Resp 16 | Ht 60.0 in | Wt 171.8 lb

## 2017-04-13 DIAGNOSIS — Z7951 Long term (current) use of inhaled steroids: Secondary | ICD-10-CM | POA: Insufficient documentation

## 2017-04-13 DIAGNOSIS — Z789 Other specified health status: Secondary | ICD-10-CM

## 2017-04-13 DIAGNOSIS — M19012 Primary osteoarthritis, left shoulder: Secondary | ICD-10-CM

## 2017-04-13 DIAGNOSIS — E119 Type 2 diabetes mellitus without complications: Secondary | ICD-10-CM

## 2017-04-13 DIAGNOSIS — M62838 Other muscle spasm: Secondary | ICD-10-CM

## 2017-04-13 DIAGNOSIS — Z79890 Hormone replacement therapy (postmenopausal): Secondary | ICD-10-CM | POA: Insufficient documentation

## 2017-04-13 DIAGNOSIS — K219 Gastro-esophageal reflux disease without esophagitis: Secondary | ICD-10-CM | POA: Insufficient documentation

## 2017-04-13 DIAGNOSIS — E079 Disorder of thyroid, unspecified: Secondary | ICD-10-CM | POA: Insufficient documentation

## 2017-04-13 DIAGNOSIS — I10 Essential (primary) hypertension: Secondary | ICD-10-CM | POA: Insufficient documentation

## 2017-04-13 DIAGNOSIS — E785 Hyperlipidemia, unspecified: Secondary | ICD-10-CM | POA: Insufficient documentation

## 2017-04-13 DIAGNOSIS — M62830 Muscle spasm of back: Secondary | ICD-10-CM | POA: Insufficient documentation

## 2017-04-13 DIAGNOSIS — R21 Rash and other nonspecific skin eruption: Secondary | ICD-10-CM

## 2017-04-13 DIAGNOSIS — Z7984 Long term (current) use of oral hypoglycemic drugs: Secondary | ICD-10-CM | POA: Insufficient documentation

## 2017-04-13 DIAGNOSIS — Z79899 Other long term (current) drug therapy: Secondary | ICD-10-CM | POA: Insufficient documentation

## 2017-04-13 DIAGNOSIS — F329 Major depressive disorder, single episode, unspecified: Secondary | ICD-10-CM | POA: Insufficient documentation

## 2017-04-13 LAB — GLUCOSE, POCT (MANUAL RESULT ENTRY)
POC GLUCOSE: 317 mg/dL — AB (ref 70–99)
POC Glucose: 300 mg/dl — AB (ref 70–99)

## 2017-04-13 MED ORDER — IBUPROFEN 600 MG PO TABS: 600.0000 mg | ORAL_TABLET | Freq: Three times a day (TID) | ORAL | 1 refills | Status: DC | PRN

## 2017-04-13 MED ORDER — TRIAMCINOLONE ACETONIDE 0.1 % EX CREA: 1.0000 "application " | TOPICAL_CREAM | Freq: Two times a day (BID) | CUTANEOUS | 0 refills | Status: DC

## 2017-04-13 MED ORDER — METHOCARBAMOL 500 MG PO TABS: 500.0000 mg | ORAL_TABLET | Freq: Three times a day (TID) | ORAL | 0 refills | Status: DC

## 2017-04-13 MED ORDER — INSULIN ASPART 100 UNIT/ML ~~LOC~~ SOLN
15.0000 [IU] | Freq: Once | SUBCUTANEOUS | Status: AC
  Administered 2017-04-13: 15 [IU] via SUBCUTANEOUS

## 2017-04-13 MED FILL — METHOCARBAMOL 500 MG TABLET: 500 | 30 days supply | Qty: 90 | Fill #0

## 2017-04-13 MED FILL — TRIAMCINOLONE ACETONIDE 0.1: 0.1 | 15 days supply | Qty: 30 | Fill #0

## 2017-04-13 MED FILL — IBUPROFEN 600 MG TABLET: 600 | 10 days supply | Qty: 30 | Fill #0

## 2017-04-13 NOTE — Progress Notes (Signed)
CPatient stated she is having a little headache and her throat hurts.  Patient have a rash on her elbows and a muscular pain on her left shoulder.

## 2017-04-13 NOTE — Progress Notes (Signed)
Patient ID: Erin Huff, female   DOB: [birth year], 62 y.o.   MRN: [id number]      Erin Huff, is a 62 y.o. female  [id number]:[id number]  [id number]  DOB - 08-Apr-1955  Subjective:  Chief Complaint and HPI: [name] is a 62 y.o. female here today [employer] interpreters translating "Caren Griffins."    Mild HA, ST and was just put on Doxycycline at the Urgent care for URI 04/10/2017 and that is improving.    Here today for rash B elbows that itches. This has been present X 2 weeks.  No new soap/detergents.   L shoulder pain X 2 months.  NKI other than 1.5 years ago and xrays were negative.  .  R hand dominant. It is actually the L upper shoulder and back where she is having muscle spasms that are painful.  No weakness or paresthesias.    Blood sugar is high this morning.  Just took oral meds.   ROS:   Constitutional:  No f/c, No night sweats, No unexplained weight loss. EENT:  No vision changes, No blurry vision, No hearing changes. No other mouth, throat, or ear problems.  Respiratory: No cough, No SOB Cardiac: No CP, no palpitations GI:  No abd pain, No N/V/D. GU: No Urinary s/sx Musculoskeletal: L shoulder/muscle/upper back paoin Neuro: No new headache, no dizziness, no motor weakness.  Skin: +rash Endocrine:  No polydipsia. No polyuria.  Psych: Denies SI/HI  No problems updated.  ALLERGIES: Allergies  Allergen Reactions  . Penicillins Rash    PAST MEDICAL HISTORY: Past Medical History:  Diagnosis Date  . Allergy   . Depression    mild  . Diabetes mellitus without complication ([place])   . GERD (gastroesophageal reflux disease)   . Heart murmur   . Hyperlipidemia   . Hypertension   . Thyroid disease     MEDICATIONS AT HOME: Prior to Admission medications   Medication Sig Start Date End Date Taking? Authorizing Provider  atorvastatin (LIPITOR) 10 MG tablet Take 1 tablet (10 mg total) by mouth daily at 6 PM. 03/09/17  Yes  Hairston, Mandesia R, FNP  Blood Glucose Monitoring Suppl (TRUE METRIX METER) w/Device KIT Use as directed 06/14/16  Yes Jegede, Olugbemiga E, MD  doxycycline (VIBRAMYCIN) 100 MG capsule Take 1 capsule (100 mg total) by mouth 2 (two) times daily. 04/10/17  Yes Jaynee Eagles, PA-C  glipiZIDE (GLUCOTROL XL) 10 MG 24 hr tablet Take 1 tablet (10 mg total) by mouth daily with breakfast. 03/09/17  Yes Hairston, Mandesia R, FNP  glucose blood (TRUE METRIX BLOOD GLUCOSE TEST) test strip Use as instructed 06/14/16  Yes Jegede, Olugbemiga E, MD  levothyroxine (SYNTHROID, LEVOTHROID) 125 MCG tablet Take 1 tablet (125 mcg total) by mouth daily before breakfast. 04/04/17  Yes Hairston, Mandesia R, FNP  lisinopril (PRINIVIL,ZESTRIL) 10 MG tablet Take 1 tablet (10 mg total) by mouth daily. 03/31/17  Yes Hairston, Maylon Peppers, FNP  loratadine (CLARITIN) 10 MG tablet Take 1 tablet (10 mg total) by mouth daily. 04/10/17  Yes Alfonse Spruce, FNP  metFORMIN (GLUCOPHAGE) 1000 MG tablet Take 1 tablet (1,000 mg total) by mouth 2 (two) times daily with a meal. 03/09/17  Yes Hairston, Maylon Peppers, FNP  TRUEPLUS LANCETS 28G MISC Use as directed 06/14/16  Yes Jegede, Olugbemiga E, MD  capsaicin (ZOSTRIX) 0.025 % cream Apply topically 2 (two) times daily. Patient not taking: Reported on 03/31/2017 09/20/16   Alfonse Spruce, FNP  fexofenadine (ALLEGRA) 180 MG tablet Take 180 mg by  mouth daily.    [provider]  fluticasone (FLONASE) 50 MCG/ACT nasal spray Place 2 sprays into both nostrils daily for 7 days. Patient not taking: Reported on 03/31/2017 03/22/17 03/29/17  Wieters, Madelynn Done C, PA-C  ibuprofen (ADVIL,MOTRIN) 600 MG tablet Take 1 tablet (600 mg total) by mouth every 8 (eight) hours as needed (Take with food.). Prn pain 04/13/17   Argentina Donovan, PA-C  meclizine (ANTIVERT) 25 MG tablet Take 1 tablet (25 mg total) by mouth 3 (three) times daily as needed for dizziness. Patient not taking: Reported on 04/13/2017 09/26/16    Barnet Glasgow, NP  methocarbamol (ROBAXIN) 500 MG tablet Take 1 tablet (500 mg total) by mouth 3 (three) times daily. Prn spasm 04/13/17   Argentina Donovan, PA-C  metroNIDAZOLE (FLAGYL) 500 MG tablet Take 1 tablet (500 mg total) by mouth 2 (two) times daily. Patient not taking: Reported on 03/31/2017 03/15/17   Alfonse Spruce, FNP  Multiple Vitamin (MULTIVITAMIN) tablet Take 1 tablet by mouth daily.    [provider]  omega-3 acid ethyl esters (LOVAZA) 1 g capsule Take 1 capsule (1 g total) by mouth 2 (two) times daily. Patient not taking: Reported on 03/31/2017 12/02/16   Alfonse Spruce, FNP  OVER THE COUNTER MEDICATION nutrilite carb blocker as needed- helps with constipation    [provider]  phenol (CHLORASEPTIC) 1.4 % LIQD Use as directed 1 spray in the mouth or throat as needed for throat irritation / pain. Patient not taking: Reported on 03/31/2017 03/22/17   Wieters, Hallie C, PA-C  ranitidine (ZANTAC) 300 MG tablet Take 1 tablet (300 mg total) by mouth at bedtime. Patient not taking: Reported on 03/31/2017 09/26/16   Barnet Glasgow, NP  triamcinolone cream (KENALOG) 0.1 % Apply 1 application topically 2 (two) times daily. 04/13/17   Argentina Donovan, PA-C     Objective:  EXAM:   Vitals:   04/13/17 0959  BP: (!) 141/80  Pulse: 94  Resp: 16  Temp: 98.2 F (36.8 C)  TempSrc: Oral  SpO2: 97%  Weight: 171 lb 12.8 oz (77.9 kg)  Height: 5' (1.524 m)    General appearance : A&OX3. NAD. Non-toxic-appearing HEENT: Atraumatic and Normocephalic.  PERRLA. EOM intact.  TM clear B. Mouth-MMM, post pharynx WNL w/o erythema, No PND. Neck: supple, no JVD. No cervical lymphadenopathy. No thyromegaly Chest/Lungs:  Breathing-non-labored, Good air entry bilaterally, breath sounds normal without rales, rhonchi, or wheezing  CVS: S1 S2 regular, no murmurs, gallops, rubs  Extremities: Bilateral Lower Ext shows no edema, both legs are warm to touch with = pulse  throughout.  L shoulder is stable without laxity.  There is no point tenderness.  She has full S&ROM.  There is spasm in L trapezius.   Neurology:  CN II-XII grossly intact, Non focal.   Psych:  TP linear. J/I WNL. Normal speech. Appropriate eye contact and affect.  Skin:  Dry skin B elbows  Data Review Lab Results  Component Value Date   HGBA1C 6.9 03/31/2017   HGBA1C 7.1 03/09/2017   HGBA1C 7.5 12/02/2016     Assessment & Plan   1. Muscle spasm L trapezius - methocarbamol (ROBAXIN) 500 MG tablet; Take 1 tablet (500 mg total) by mouth 3 (three) times daily. Prn spasm  Dispense: 90 tablet; Refill: 0  2. Type 2 diabetes mellitus without complication, without long-term current use of insulin (HCC) High today.   - Glucose (CBG) - insulin aspart (novoLOG) injection 15 Units  3.  Primary osteoarthritis of left shoulder - ibuprofen (ADVIL,MOTRIN) 600 MG tablet; Take 1 tablet (600 mg total) by mouth every 8 (eight) hours as needed (Take with food.). Prn pain  Dispense: 30 tablet; Refill: 1  4. Rash - triamcinolone cream (KENALOG) 0.1 %; Apply 1 application topically 2 (two) times daily.  Dispense: 30 g; Refill: 0  5. Language barrier [employer] interpreters used and additional time performing visit was required.  Patient have been counseled extensively about nutrition and exercise  Return in about 3 months (around 07/11/2017) for assign PCP; f/up DM .  The patient was given clear instructions to go to ER or return to medical center if symptoms don't improve, worsen or new problems develop. The patient verbalized understanding. The patient was told to call to get lab results if they haven't heard anything in the next week.     Freeman Caldron, PA-C [facility], [place]   04/13/2017, 10:31 AM

## 2017-04-19 ENCOUNTER — Other Ambulatory Visit: Payer: Self-pay | Admitting: Obstetrics and Gynecology

## 2017-04-19 DIAGNOSIS — Z1231 Encounter for screening mammogram for malignant neoplasm of breast: Secondary | ICD-10-CM

## 2017-04-21 MED FILL — ?ATORVASTATIN 10 MG TABLET: 10 | 30 days supply | Qty: 30 | Fill #1

## 2017-04-24 NOTE — Telephone Encounter (Signed)
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 You will be referred to gynecology for further follow up.  HPV present. HPV is a virus spread through intimate skin to skin contact. Some types of HPV are associated with genital warts while other types can increase risk of cervical cancer. Most HPV go away on their own within 2 years but some can last longer and increase risk.  Culture for herpes is negative for active lesions containing herpes.    Pt  name and DOB verified. Pt is aware of result note and results.  Interpreter assistance provided by [phone], Marylene LandAngela

## 2017-04-28 ENCOUNTER — Telehealth: Payer: Self-pay | Admitting: Family Medicine

## 2017-04-28 NOTE — Telephone Encounter (Signed)
Pt called to request advice on what to do since  -glipiZIDE (GLUCOTROL XL) 10 MG 24 hr tablet  Is causing her stomach pain in the mornings when she takes it.she will call to get in the walkin schedule on Monday.but wants to know if she has to discontinue use until her appt. Please follow up

## 2017-05-01 NOTE — Telephone Encounter (Signed)
She could take half of the 10mg  pill with food and schedule an appointment for an office visit.

## 2017-05-02 NOTE — Telephone Encounter (Signed)
Pt aware of message per Dr. Alvis LemmingsNewlin. She is aware to take half pill with food. Will schedule an appointment to f/u.

## 2017-05-04 MED FILL — glipiZIDE ER 10 MG TB24: 10 | 30 days supply | Qty: 30 | Fill #2

## 2017-05-04 MED FILL — ?METFORMIN HCL 1,000 MG TAB: 1000 | 30 days supply | Qty: 60 | Fill #1

## 2017-05-04 MED FILL — ?LEVOTHYROXINE 125 MCG TABL: 125 | 30 days supply | Qty: 30 | Fill #1

## 2017-05-09 ENCOUNTER — Encounter (HOSPITAL_COMMUNITY): Payer: Self-pay

## 2017-05-09 ENCOUNTER — Ambulatory Visit
Admission: RE | Admit: 2017-05-09 | Discharge: 2017-05-09 | Disposition: A | Discharge status: Home / Self Care | Payer: No Typology Code available for payment source | Source: Ambulatory Visit | Attending: Obstetrics and Gynecology | Admitting: Obstetrics and Gynecology

## 2017-05-09 ENCOUNTER — Ambulatory Visit (HOSPITAL_COMMUNITY)
Admission: RE | Admit: 2017-05-09 | Discharge: 2017-05-09 | Disposition: A | Discharge status: Home / Self Care | Payer: Self-pay | Source: Ambulatory Visit | Attending: Obstetrics and Gynecology | Admitting: Obstetrics and Gynecology

## 2017-05-09 VITALS — BP 160/100 | Wt 171.0 lb

## 2017-05-09 DIAGNOSIS — R87612 Low grade squamous intraepithelial lesion on cytologic smear of cervix (LGSIL): Secondary | ICD-10-CM

## 2017-05-09 DIAGNOSIS — Z1239 Encounter for other screening for malignant neoplasm of breast: Secondary | ICD-10-CM

## 2017-05-09 DIAGNOSIS — Z1231 Encounter for screening mammogram for malignant neoplasm of breast: Secondary | ICD-10-CM

## 2017-05-09 NOTE — Progress Notes (Signed)
Patient referred to [facility] by [facility] due to having an abnormal Pap smear on 03/31/2017 that a colposcopy is recommended for follow-up.  Pap Smear: Pap smear not completed today. Last Pap smear was 03/31/2017 at [facility] and LSIL with positive HPV. Ref[facility] to [facility] for a colposcopy to follow-up for her abnormal Pap smear. Appointment scheduled for Friday, June 02, 2017 at 1030. Per patient has no history of an abnormal Pap smear prior to her most recent Pap smear. Last Pap smear result is in Epic.  Physical exam: Breasts Breasts symmetrical. No skin abnormalities bilateral breasts. No nipple retraction bilateral breasts. No nipple discharge bilateral breasts. No lymphadenopathy. No lumps palpated bilateral breasts. No complaints of pain or tenderness on exam. [facility]. Appointment scheduled for Tuesday, May 09, 2017 at 1340.        Pelvic/Bimanual No Pap smear completed today since last Pap smear was 03/31/2017. Pap smear not indicated per BCCCP guidelines.   Smoking History: Patient has never smoked.  Patient Navigation: Patient education provided. Access to services provided for patient through [facility] program. Spanish interpreter provided.   Colorectal Cancer Screening: Patient had a colonoscopy completed 08/02/2016. No complaints today.   Breast and Cervical Cancer Risk Assessment: Patient has a family history of a maternal aunt being diagnosed with breast cancer. Patient has no known genetic mutations or history radiation treatment to the chest before age 60. Patient has no history of cervical dysplasia, immunocompromised, or DES exposure in-utero.  Used Spanish interpreter [employer]rika McReynolds from [place].

## 2017-05-09 NOTE — Patient Instructions (Signed)
Explained breast self awareness with Erin Huff. Patient did not need a Pap smear today due to last Pap smear was 03/31/2017. Explained the colposcopy the recommended follow-up for her abnormal Pap smear. [facility] for a colposcopy to follow-up for her abnormal Pap smear. Appointment scheduled for Friday, June 02, 2017 at 1030. [facility] for a screening mamm[facility]. Appointment scheduled for Tuesday, May 09, 2017 at 1340. Patient aware of appointments and will be there. Let patient know the Breast Center will follow up with her within the next couple weeks with results of mammogram by letter or phone. Erin Huff verbalized understanding.  Vir Whetstine, Kathaleen Maserhristine Poll, RN 10:44 AM

## 2017-05-15 ENCOUNTER — Encounter (HOSPITAL_COMMUNITY): Payer: Self-pay | Admitting: *Deleted

## 2017-05-23 ENCOUNTER — Ambulatory Visit: Payer: Self-pay | Attending: Nurse Practitioner | Admitting: Nurse Practitioner

## 2017-05-23 ENCOUNTER — Encounter: Payer: Self-pay | Admitting: Nurse Practitioner

## 2017-05-23 VITALS — BP 138/81 | HR 97 | Temp 99.0°F | Ht 60.0 in | Wt 171.8 lb

## 2017-05-23 DIAGNOSIS — E11649 Type 2 diabetes mellitus with hypoglycemia without coma: Secondary | ICD-10-CM | POA: Insufficient documentation

## 2017-05-23 DIAGNOSIS — Z79899 Other long term (current) drug therapy: Secondary | ICD-10-CM | POA: Insufficient documentation

## 2017-05-23 DIAGNOSIS — D692 Other nonthrombocytopenic purpura: Secondary | ICD-10-CM | POA: Insufficient documentation

## 2017-05-23 DIAGNOSIS — J309 Allergic rhinitis, unspecified: Secondary | ICD-10-CM | POA: Insufficient documentation

## 2017-05-23 DIAGNOSIS — Z8371 Family history of colonic polyps: Secondary | ICD-10-CM | POA: Insufficient documentation

## 2017-05-23 DIAGNOSIS — E782 Mixed hyperlipidemia: Secondary | ICD-10-CM | POA: Insufficient documentation

## 2017-05-23 DIAGNOSIS — E039 Hypothyroidism, unspecified: Secondary | ICD-10-CM | POA: Insufficient documentation

## 2017-05-23 DIAGNOSIS — Z880 Allergy status to penicillin: Secondary | ICD-10-CM | POA: Insufficient documentation

## 2017-05-23 DIAGNOSIS — K219 Gastro-esophageal reflux disease without esophagitis: Secondary | ICD-10-CM | POA: Insufficient documentation

## 2017-05-23 DIAGNOSIS — F329 Major depressive disorder, single episode, unspecified: Secondary | ICD-10-CM | POA: Insufficient documentation

## 2017-05-23 DIAGNOSIS — E1165 Type 2 diabetes mellitus with hyperglycemia: Secondary | ICD-10-CM | POA: Insufficient documentation

## 2017-05-23 DIAGNOSIS — Z7984 Long term (current) use of oral hypoglycemic drugs: Secondary | ICD-10-CM | POA: Insufficient documentation

## 2017-05-23 DIAGNOSIS — I10 Essential (primary) hypertension: Secondary | ICD-10-CM | POA: Insufficient documentation

## 2017-05-23 LAB — GLUCOSE, POCT (MANUAL RESULT ENTRY): POC Glucose: 147 mg/dl — AB (ref 70–99)

## 2017-05-23 MED ORDER — METFORMIN HCL 1000 MG PO TABS: 1000.0000 mg | ORAL_TABLET | Freq: Two times a day (BID) | ORAL | 5 refills | Status: DC

## 2017-05-23 MED ORDER — ATORVASTATIN CALCIUM 10 MG PO TABS
10.0000 mg | ORAL_TABLET | Freq: Every day | ORAL | 5 refills | Status: DC
Start: 2017-05-23 — End: 2017-08-22

## 2017-05-23 MED ORDER — LORATADINE 10 MG PO TABS: 10.0000 mg | ORAL_TABLET | Freq: Every day | ORAL | 11 refills | Status: DC

## 2017-05-23 MED ORDER — LISINOPRIL 10 MG PO TABS: 10.0000 mg | ORAL_TABLET | Freq: Every day | ORAL | 2 refills | Status: DC

## 2017-05-23 MED ORDER — GLIPIZIDE ER 5 MG PO TB24: 5.0000 mg | ORAL_TABLET | Freq: Every day | ORAL | 0 refills | Status: DC

## 2017-05-23 MED FILL — LISINOPRIL 10 MG TABS: 10 | 30 days supply | Qty: 30 | Fill #0

## 2017-05-23 MED FILL — ?ATORVASTATIN 10 MG TABLET: 10 | 30 days supply | Qty: 30 | Fill #0

## 2017-05-23 MED FILL — glipiZIDE XL 5 MG TB24: 5 | 30 days supply | Qty: 30 | Fill #0

## 2017-05-23 NOTE — Progress Notes (Signed)
Assessment & Plan:  Erin Huff was seen today for establish care and medication problem.  Diagnoses and all orders for this visit:  Uncontrolled type 2 diabetes mellitus with hyperglycemia, without long-term current use of insulin (HCC) -     Glucose (CBG) -     glipiZIDE (GLUCOTROL XL) 5 MG 24 hr tablet; Take 1 tablet (5 mg total) by mouth daily with breakfast. -     TSH -     metFORMIN (GLUCOPHAGE) 1000 MG tablet; Take 1 tablet (1,000 mg total) by mouth 2 (two) times daily with a meal. Continue blood sugar control as discussed in office today, low carbohydrate diet, and regular physical exercise as tolerated, 150 minutes per week (30 min each day, 5 days per week, or 50 min 3 days per week). Keep blood sugar logs with fasting goal of 80-130 mg/dl, post prandial less than 180.  For Hypoglycemia: BS <60 and Hyperglycemia BS >400; contact the clinic ASAP. Annual eye exams and foot exams are recommended.  Mixed hyperlipidemia -     atorvastatin (LIPITOR) 10 MG tablet; Take 1 tablet (10 mg total) by mouth daily at 6 PM. Work on a low fat, heart healthy diet and participate in regular aerobic exercise program to control as well by working out at least 150 minutes per week. No fried foods. No junk foods, sodas, sugary drinks, unhealthy snacking, or smoking.   Purpura ([place]) STABLE  Allergic rhinitis, unspecified seasonality, unspecified trigger -     loratadine (CLARITIN) 10 MG tablet; Take 1 tablet (10 mg total) by mouth daily.  Essential hypertension -     lisinopril (PRINIVIL,ZESTRIL) 10 MG tablet; Take 1 tablet (10 mg total) by mouth daily. Continue all antihypertensives as prescribed.  Remember to bring in your blood pressure log with you for your follow up appointment.  DASH/Mediterranean Diets are healthier choices for HTN.     Patient has been counseled on age-appropriate routine health concerns for screening and prevention. These are reviewed and up-to-date. Referrals have been  placed accordingly. Immunizations are up-to-date or declined.    Subjective:   Chief Complaint  Patient presents with  . Establish Care    Pt is here to establish care.  . Medication Problem    Pt. stated Glipizide uspet her stomach and was told to take 1/2 pill and it dosen't upset her stomach anymore.    HPI Erin Huff 62 y.o. female presents to office today to establish care. VRI was used to communicate directly with patient for the entire encounter including providing detailed patient instructions.   Essential Hypertension Chronic. Stable. Endorses medication compliance taking lisinopril  '10Mg'$  daily. She is not diet or exercise compliant. Denies chest pain, shortness of breath, palpitations, lightheadedness, dizziness, headaches or BLE edema.  BP Readings from Last 3 Encounters:  05/23/17 138/81  05/09/17 (!) 160/100  04/13/17 (!) 141/80    Hypothyroidism Taking synthroid as prescribed. Denies any hypo or hyperglycemia symptoms. Her synthroid was decreased due to low levels a few months ago. Will recheck today.  Lab Results  Component Value Date   TSH 0.418 (L) 03/10/2017    Hyperlipidemia Chronic. Stable. Denies statin intolerance or myalgias. Endorses medication compliance. LDL is at goal.  Needs repeat fasting labs.  Lab Results  Component Value Date   LDLCALC 86 12/02/2016    Diabetes Mellitus Type 2 Checking blood sugars every morning with readings: 120-130s. She sometimes takes one half metformin tablet because her blood sugar "gets low". When I inquired as  to what "low sugar" means for her she states 90s. She endorses hypoglycemic symptoms of when her blood sugar is in the 90s. Currently on an ACE inhibitor. She started taking half a tablet of glipizide due to GI upset. I instructed her that the tablet was EC and I would refill her glipizide '5mg'$ .  Cardiovascular risk factors: diabetes mellitus, dyslipidemia, hypertension, obesity (BMI >= 30 kg/m2) and  sedentary lifestyle. Lab Results  Component Value Date   HGBA1C 6.9 03/31/2017    Review of Systems  Constitutional: Negative for fever, malaise/fatigue and weight loss.  HENT: Negative.  Negative for nosebleeds.   Eyes: Negative.  Negative for blurred vision, double vision and photophobia.  Respiratory: Negative.  Negative for cough and shortness of breath.   Cardiovascular: Negative.  Negative for chest pain, palpitations and leg swelling.  Gastrointestinal: Negative.  Negative for heartburn, nausea and vomiting.  Musculoskeletal: Negative.  Negative for myalgias.  Neurological: Negative.  Negative for dizziness, focal weakness, seizures and headaches.  Endo/Heme/Allergies: Positive for environmental allergies.  Psychiatric/Behavioral: Negative.  Negative for suicidal ideas.    Past Medical History:  Diagnosis Date  . Allergy   . Depression    mild  . Diabetes mellitus without complication ([place])   . GERD (gastroesophageal reflux disease)   . Heart murmur   . Hyperlipidemia   . Hypertension   . Thyroid disease     Past Surgical History:  Procedure Laterality Date  . NO PAST SURGERIES      Family History  Problem Relation Age of Onset  . Hypertension Sister   . Diabetes Brother   . Breast cancer Maternal Aunt   . Colon cancer Neg Hx   . Colon polyps Neg Hx   . Esophageal cancer Neg Hx   . Rectal cancer Neg Hx   . Stomach cancer Neg Hx     Social History Reviewed with no changes to be made today.   Outpatient Medications Prior to Visit  Medication Sig Dispense Refill  . Blood Glucose Monitoring Suppl (TRUE METRIX METER) w/Device KIT Use as directed 1 kit 0  . fexofenadine (ALLEGRA) 180 MG tablet Take 180 mg by mouth daily.    Marland Kitchen glucose blood (TRUE METRIX BLOOD GLUCOSE TEST) test strip Use as instructed 100 each 12  . ibuprofen (ADVIL,MOTRIN) 600 MG tablet Take 1 tablet (600 mg total) by mouth every 8 (eight) hours as needed (Take with food.). Prn pain 30 tablet  1  . levothyroxine (SYNTHROID, LEVOTHROID) 125 MCG tablet Take 1 tablet (125 mcg total) by mouth daily before breakfast. 90 tablet 0  . atorvastatin (LIPITOR) 10 MG tablet Take 1 tablet (10 mg total) by mouth daily at 6 PM. 30 tablet 5  . glipiZIDE (GLUCOTROL XL) 10 MG 24 hr tablet Take 1 tablet (10 mg total) by mouth daily with breakfast. 30 tablet 5  . lisinopril (PRINIVIL,ZESTRIL) 10 MG tablet Take 1 tablet (10 mg total) by mouth daily. 30 tablet 2  . metFORMIN (GLUCOPHAGE) 1000 MG tablet Take 1 tablet (1,000 mg total) by mouth 2 (two) times daily with a meal. 60 tablet 5  . Multiple Vitamin (MULTIVITAMIN) tablet Take 1 tablet by mouth daily.    Marland Kitchen omega-3 acid ethyl esters (LOVAZA) 1 g capsule Take 1 capsule (1 g total) by mouth 2 (two) times daily. (Patient not taking: Reported on 03/31/2017) 60 capsule 3  . OVER THE COUNTER MEDICATION nutrilite carb blocker as needed- helps with constipation    . ranitidine (ZANTAC) 300  MG tablet Take 1 tablet (300 mg total) by mouth at bedtime. (Patient not taking: Reported on 03/31/2017) 30 tablet 2  . TRUEPLUS LANCETS 28G MISC Use as directed (Patient not taking: Reported on 05/09/2017) 100 each 5  . capsaicin (ZOSTRIX) 0.025 % cream Apply topically 2 (two) times daily. (Patient not taking: Reported on 05/23/2017) 60 g 0  . doxycycline (VIBRAMYCIN) 100 MG capsule Take 1 capsule (100 mg total) by mouth 2 (two) times daily. (Patient not taking: Reported on 05/09/2017) 20 capsule 0  . fluticasone (FLONASE) 50 MCG/ACT nasal spray Place 2 sprays into both nostrils daily for 7 days. (Patient not taking: Reported on 03/31/2017) 1 g 0  . loratadine (CLARITIN) 10 MG tablet Take 1 tablet (10 mg total) by mouth daily. (Patient not taking: Reported on 05/09/2017) 30 tablet 11  . meclizine (ANTIVERT) 25 MG tablet Take 1 tablet (25 mg total) by mouth 3 (three) times daily as needed for dizziness. (Patient not taking: Reported on 04/13/2017) 30 tablet 0  . methocarbamol (ROBAXIN) 500  MG tablet Take 1 tablet (500 mg total) by mouth 3 (three) times daily. Prn spasm (Patient not taking: Reported on 05/09/2017) 90 tablet 0  . metroNIDAZOLE (FLAGYL) 500 MG tablet Take 1 tablet (500 mg total) by mouth 2 (two) times daily. (Patient not taking: Reported on 03/31/2017) 14 tablet 0  . phenol (CHLORASEPTIC) 1.4 % LIQD Use as directed 1 spray in the mouth or throat as needed for throat irritation / pain. (Patient not taking: Reported on 03/31/2017) 20 mL 0  . triamcinolone cream (KENALOG) 0.1 % Apply 1 application topically 2 (two) times daily. (Patient not taking: Reported on 05/09/2017) 30 g 0   Facility-Administered Medications Prior to Visit  Medication Dose Route Frequency Provider Last Rate Last Dose  . 0.9 %  sodium chloride infusion  500 mL Intravenous Continuous Pyrtle, Lajuan Lines, MD        Allergies  Allergen Reactions  . Penicillins Rash       Objective:    BP 138/81 (BP Location: Right Arm, Patient Position: Sitting, Cuff Size: Normal)   Pulse 97   Temp 99 F (37.2 C) (Oral)   Ht 5' (1.524 m)   Wt 171 lb 12.8 oz (77.9 kg)   SpO2 99%   BMI 33.55 kg/m  Wt Readings from Last 3 Encounters:  05/23/17 171 lb 12.8 oz (77.9 kg)  05/09/17 171 lb (77.6 kg)  04/13/17 171 lb 12.8 oz (77.9 kg)    Physical Exam  Constitutional: She is oriented to person, place, and time. She appears well-developed and well-nourished. She is cooperative.  HENT:  Head: Normocephalic and atraumatic.  Eyes: EOM are normal.  Neck: Normal range of motion.  Cardiovascular: Normal rate and regular rhythm. Exam reveals no gallop and no friction rub.  Murmur heard. Pulmonary/Chest: Effort normal and breath sounds normal. No tachypnea. No respiratory distress. She has no decreased breath sounds. She has no wheezes. She has no rhonchi. She has no rales. She exhibits no tenderness.  Abdominal: Soft. Bowel sounds are normal.  Musculoskeletal: Normal range of motion. She exhibits no edema.  Neurological:  She is alert and oriented to person, place, and time. Coordination normal.  Skin: Skin is warm and dry.  Psychiatric: She has a normal mood and affect. Her behavior is normal. Judgment and thought content normal.  Nursing note and vitals reviewed.     Patient has been counseled extensively about nutrition and exercise as well as the importance of  adherence with medications and regular follow-up. The patient was given clear instructions to go to ER or return to medical center if symptoms don't improve, worsen or new problems develop. The patient verbalized understanding.   Follow-up: Needs lab appointment for Friday morning fasting for  lipids Needs f/u appointment for DM/HTN/HPL in 3 months.   Gildardo Pounds, FNP-BC [facility] and [place], [place]   05/23/2017, 9:20 PM

## 2017-05-23 NOTE — Patient Instructions (Signed)
Soplo cardaco Heart Murmur Un soplo cardaco es un sonido extra cuya causa es el flujo sanguneo catico. El soplo se puede or como un "zumbido" o "silbido" cuando el flujo sanguneo atraviesa el corazn. El corazn tiene cuatro reas Field seismologist. Las vlvulas separan las cmaras superior e inferior entre s (vlvula tricspide y vlvula mitral) y separan las cmaras inferiores del corazn de los conductos que salen del corazn (vlvula artica y vlvula pulmonar). Normalmente, estas vlvulas se abren para dejar que la sangre circule a travs o fuera del corazn y Engineer, mining se cierran para evitar que la sangre retorne. Existen dos tipos de soplos cardacos:  Soplo cardaco funcional. La mayora de las personas con este tipo de soplo no tienen un problema cardaco. Muchos nios tienen soplos cardacos funcionales. Su mdico puede sugerir algunos estudios bsicos para saber si el soplo es un soplo funcional. Si se descubre un soplo cardaco funcional, no hay necesidad de [employer], Tax inspector, restringir las actividades ni suspender la prctica de deportes.  Soplo cardaco anormal. Este tipo de soplo cardaco puede aparecer en nios y adultos. Los soplos anormales pueden ser un signo de una enfermedad cardaca ms grave, como una anomala cardaca presente al nacer (anomala congnita) o una enfermedad de las vlvulas cardacas.  Cules son las causas? Esta afeccin tiene su origen en el mal funcionamiento de las vlvulas cardacas. En los nios, por lo general los soplos cardacos anormales son causados por anomalas congnitas. En los adultos, los soplos anormales normalmente provienen de problemas en las vlvulas cardacas causados por una enfermedad, infeccin o el envejecimiento. Existen tres defectos de las vlvulas cardacas que pueden causar un soplo:  Regurgitacin. Cuando la sangre se Research scientist (medical) en sentido de retorno a travs de la vlvula en la direccin  incorrecta.  Prolapso de la vlvula mitral. Cuando la vlvula mitral del corazn tiene una aleta suelta y no se cierra hermticamente.  Estenosis. Cuando la vlvula no se abre lo suficiente y bloquea el flujo sanguneo.  Entre otras causas de esta afeccin se pueden mencionar las siguientes:  Psychiatrist.  Grant Ruts.  Hipertiroidismo (glndula tiroidea hiperactiva).  Anemia.  Actividad fsica.  Perodos de crecimiento acelerado (en los nios).  Cules son los signos o los sntomas? Los soplos cardacos funcionales no provocan sntomas, y [place] personas con soplos anormales pueden tener sntomas o no. Si hay sntomas, estos pueden ser los siguientes:  Falta de [place].  Color azul en la piel, especialmente en las puntas de los dedos.  Dolor en el pecho.  Palpitaciones, sentir un aleteo o latido cardaco irregular.  Desmayos.  Tos persistente.  Cansarse con mucha ms rapidez de lo esperado.  Hinchazn del abdomen, los pies o los tobillos.  Cmo se diagnostica? Esta afeccin se puede diagnosticar durante un examen fsico de rutina o de otro tipo. Si su mdico detecta un soplo con un estetoscopio, tratar de escuchar lo siguiente:  Dnde est ubicado el soplo en su corazn.  Cunto dura el soplo (duracin).  En qu momento del latido cardaco se escucha el soplo.  Qu intensidad sonora tiene. Esto le puede ayudar al mdico a descifrar las causas del soplo.  Es posible que lo deriven a Physicist, medical (cardilogo). Tambin pueden hacerle otros estudios, por ejemplo:  Electrocardiograma (ECG o EKG). Este estudio mide la actividad elctrica del corazn.  Ecocardiograma. Este estudio [place] ondas sonoras de alta frecuencia para tomar imgenes de su corazn.  Resonancia magntica (RM) o radiografa de trax.  Cateterismo cardaco.  Este estudio observa el flujo sanguneo a travs del corazn.  Para nios y adultos que tienen un soplo cardaco anormal y desean  Microbiologistpracticar deportes, es importante completar los estudios, Chiropractoranalizar los [place]resultados con su mdico y seguir sus TEFL teacherrecomendaciones. Si hay una enfermedad cardaca, es posible que no sea seguro practicar un deporte. Cmo se trata? Los soplos no necesitan tratamiento. En algunos casos, los soplos pueden desaparecer por s solos. Si lo que est causando el soplo es una enfermedad o trastorno preexistente, es posible que necesite Tyronetratamiento. Si es necesario un tratamiento, ste depender del tipo y la gravedad de la enfermedad o el trastorno cardaco que causa el soplo. El tratamiento puede incluir lo siguiente:  Medicamentos.  Ciruga.  Cambios en la dieta y el estilo de vida.  Siga estas instrucciones en su casa:  Consulte a su mdico antes de participar en algn deporte u otras actividades que requieran mucho esfuerzo y Engineer, drillingenerga (son extenuantes).  Busque toda la informacin posible sobre su afeccin y cualquier enfermedad relacionada. Pregntele a su mdico si es probable que usted est en riesgo de alguna emergencia mdica.  Hable con su mdico sobre los sntomas a los que debera prestarle atencin.  Obtener los Terex Corporationresultados de los estudios depende de usted. Consulte al mdico o pregunte en el departamento donde se realiza la prueba cundo estarn Hexion Specialty Chemicalslistos los resultados.  Concurra a todas las visitas de control como se lo haya indicado el mdico. Esto es importante. Comunquese con un mdico si:  Tiene sensacin de desvanecimiento.  Le falta el aire frecuentemente.  Se siente ms cansado que de costumbre.  Le cuesta mantener el ritmo de las actividades normales o de las rutinas de ejercicios fsicos.  Observa hinchazn en los tobillos o en los pies.  Siente dolor en el pecho.  Nota que su corazn a menudo late de forma irregular.  Desarrolla algn sntoma nuevo. Solicite ayuda de inmediato si:  Siente un dolor intenso en el pecho.  Presenta dificultades respiratorias  sbitas.  Sufre episodios de Baxter Internationaldesmayo.  Los sntomas empeoran repentinamente. Estos sntomas pueden representar un problema grave que constituye Radio broadcast assistantuna emergencia. No espere hasta que los sntomas desaparezcan. Solicite atencin mdica de inmediato. Comunquese con el servicio de emergencias de su localidad (911 en los [place]). No conduzca por sus propios medios OfficeMax Incorporatedhasta el hospital. Resumen  Normalmente, las vlvulas cardacas se abren para dejar que la sangre circule a travs o fuera del corazn y Engineer, miningluego se cierran para evitar que la sangre retorne.  El soplo tiene su origen en el mal funcionamiento de las vlvulas cardacas.  Si lo que est causando el soplo es una enfermedad o trastorno preexistente, es posible que necesite [place]tratamiento. El tratamiento puede incluir [place]medicamento, [place]anciruga o cambios en el estilo de vida y Psychologist, forensicla dieta.  Consulte a su mdico antes de participar en algn deporte u otras actividades que requieran mucho esfuerzo y Engineer, drillingenerga (son extenuantes).  Hable con su mdico sobre los sntomas a los que debera prestarle atencin. Esta informacin no tiene Theme park managercomo fin reemplazar el consejo del mdico. Asegrese de hacerle al mdico cualquier pregunta que tenga. Document Released: 02/07/2005 Document Revised: 06/14/2016 Document Reviewed: 06/14/2016 Elsevier Interactive Patient Education  [employer]2018 Elsevier Inc.

## 2017-05-24 ENCOUNTER — Other Ambulatory Visit: Payer: Self-pay | Admitting: Nurse Practitioner

## 2017-05-24 DIAGNOSIS — E039 Hypothyroidism, unspecified: Secondary | ICD-10-CM

## 2017-05-24 LAB — TSH: TSH: 0.025 u[IU]/mL — ABNORMAL LOW (ref 0.450–4.500)

## 2017-05-24 MED ORDER — LEVOTHYROXINE SODIUM 112 MCG PO TABS: 112.0000 ug | ORAL_TABLET | Freq: Every day | ORAL | 1 refills | Status: DC

## 2017-05-26 ENCOUNTER — Ambulatory Visit: Payer: No Typology Code available for payment source | Attending: Nurse Practitioner

## 2017-05-26 ENCOUNTER — Telehealth: Payer: Self-pay

## 2017-05-26 DIAGNOSIS — E7800 Pure hypercholesterolemia, unspecified: Secondary | ICD-10-CM | POA: Insufficient documentation

## 2017-05-26 DIAGNOSIS — I10 Essential (primary) hypertension: Secondary | ICD-10-CM | POA: Insufficient documentation

## 2017-05-26 MED FILL — ?LEVOTHYROXINE 112MCG TABLE: 112 | 30 days supply | Qty: 30 | Fill #0

## 2017-05-26 NOTE — Telephone Encounter (Signed)
CMA called patient to inform on results and PCP advising, and new Rx for her thyroid.  Patient stated she already pick up her new Rx. Patient verified DOB, no questions/concerns.  Patient understood PCP instructions and is aware to make a lab appt. For her thyroid in 6 weeks.   Spanish interpreter Izora GalaLorenzo [phone] assist with the call.

## 2017-05-26 NOTE — Progress Notes (Signed)
Patient here for lab visit  

## 2017-05-26 NOTE — Telephone Encounter (Signed)
-----   Message from Claiborne RiggZelda W Fleming, NP sent at 05/24/2017 10:32 PM EDT ----- Will decrease thyroid medication to 112mcg per day. Make sure you are taking this medication by itself and at least 30 minutes before any other medication or food. Please make a lab appointment for so that we can recheck your TSH again in 6 weeks.

## 2017-05-27 LAB — LIPID PANEL
CHOLESTEROL TOTAL: 120 mg/dL (ref 100–199)
Chol/HDL Ratio: 2.8 ratio (ref 0.0–4.4)
HDL: 43 mg/dL (ref >39)
LDL Calculated: 40 mg/dL (ref 0–99)
Triglycerides: 187 mg/dL — ABNORMAL HIGH (ref 0–149)
VLDL Cholesterol Cal: 37 mg/dL (ref 5–40)

## 2017-06-02 ENCOUNTER — Encounter: Payer: Self-pay | Admitting: Family Medicine

## 2017-06-02 ENCOUNTER — Ambulatory Visit (INDEPENDENT_AMBULATORY_CARE_PROVIDER_SITE_OTHER): Payer: Self-pay | Admitting: Obstetrics & Gynecology

## 2017-06-02 ENCOUNTER — Other Ambulatory Visit (HOSPITAL_COMMUNITY)
Admission: RE | Admit: 2017-06-02 | Discharge: 2017-06-02 | Disposition: A | Discharge status: Home / Self Care | Payer: No Typology Code available for payment source | Source: Ambulatory Visit | Attending: Obstetrics & Gynecology | Admitting: Obstetrics & Gynecology

## 2017-06-02 ENCOUNTER — Encounter: Payer: Self-pay | Admitting: Obstetrics & Gynecology

## 2017-06-02 VITALS — BP 144/74 | HR 79 | Ht 61.0 in | Wt 167.3 lb

## 2017-06-02 DIAGNOSIS — R87612 Low grade squamous intraepithelial lesion on cytologic smear of cervix (LGSIL): Secondary | ICD-10-CM

## 2017-06-02 NOTE — Progress Notes (Signed)
   Subjective:    Patient ID: Erin Huff, female    DOB: 11-Jun-1955, 62 y.o.   MRN: [id number]  HPI 62 yo married Hispanic P5 is here for a colpo due to a LGSIL pap smear.    Review of Systems     Objective:   Physical Exam Breathing, conversing, and ambulating normally Video interpretor used for the visit UPT negative, consent signed, time out done Cervix prepped with acetic acid. Transformation zone seen in its entirety. Colpo adequate. Changes c/w LGSIL  (acetowhite changes) seen at the 5 o'clock position. I biopsied this area ECC obtained. She tolerated the procedure well.     Assessment & Plan:  LGSIL pap- await pathology

## 2017-06-04 ENCOUNTER — Other Ambulatory Visit: Payer: Self-pay | Admitting: Nurse Practitioner

## 2017-06-04 DIAGNOSIS — E781 Pure hyperglyceridemia: Secondary | ICD-10-CM

## 2017-06-04 MED ORDER — OMEGA-3-ACID ETHYL ESTERS 1 G PO CAPS: 1.0000 g | ORAL_CAPSULE | Freq: Two times a day (BID) | ORAL | 3 refills | Status: DC

## 2017-06-05 ENCOUNTER — Telehealth: Payer: Self-pay

## 2017-06-05 MED FILL — OMEGA-3 ETHYL ESTERS 1 GM C: 1 | 30 days supply | Qty: 60 | Fill #0

## 2017-06-05 NOTE — Telephone Encounter (Signed)
CMA attempt to call patient to inform on lab results. No answer and left a VM for patient to call back.   If patient call back, please inform:  Triglycerides are elevated. Continue to take your atorvastatin. I have also sent in refills of your omega 3 or Lovaza for you to take as well.

## 2017-06-05 NOTE — Telephone Encounter (Signed)
-----   Message from Claiborne RiggZelda W Fleming, NP sent at 06/04/2017  7:05 PM EDT ----- Triglycerides are elevated. Continue to take your atorvastatin. I have also sent in refills of your omega 3 or Lovaza for you to take as well.

## 2017-06-06 ENCOUNTER — Encounter: Payer: Self-pay | Admitting: *Deleted

## 2017-06-14 ENCOUNTER — Telehealth: Payer: Self-pay | Admitting: General Practice

## 2017-06-14 MED FILL — ?METFORMIN HCL 1,000 MG TAB: 1000 | 30 days supply | Qty: 60 | Fill #0

## 2017-06-14 NOTE — Telephone Encounter (Signed)
Called patient with Erin Huff for interpreter & informed her of results & need for cryo. Explained procedure to patient & reason for treatment. Discussed new equipment is being ordered for the procedure and we will call her when it comes in for the appt. Patient verbalized understanding. Patient states she got a bill for $300 in the mail and doesn't know who to talk to. Told patient if it is our doctor, then she should contact [facility]. Patient verbalized understanding & had no questions

## 2017-06-14 NOTE — Telephone Encounter (Signed)
-----   Message from Allie BossierMyra C Dove, MD sent at 06/14/2017  2:33 PM EDT ----- She will need a cryo. Thanks

## 2017-06-22 ENCOUNTER — Telehealth: Payer: Self-pay | Admitting: Nurse Practitioner

## 2017-06-22 MED FILL — glipiZIDE XL 5 MG TB24: 5 | 30 days supply | Qty: 30 | Fill #1

## 2017-06-22 MED FILL — ?LEVOTHYROXINE 112 MCG TAB: 112 | 30 days supply | Qty: 30 | Fill #1

## 2017-06-22 NOTE — Telephone Encounter (Signed)
CMA called patient to inform that her medication is ready at [facility] pharmacy. Patient understood. Spanish interpreter Obed [phone] assist with the call.

## 2017-06-22 NOTE — Telephone Encounter (Signed)
Patient called and requested for listed medications to be refilled and sent to [facility]. Please fu at your earliest convenience. Patient requested for it to be ready for pick up by tomorrow if possible. Patient stated she would be out of meds tomorrow.   glipiZIDE (GLUCOTROL XL) 5 MG 24 hr tablet [[id number]]  levothyroxine (SYNTHROID, LEVOTHROID) 112 MCG tablet [[id number]]

## 2017-06-30 ENCOUNTER — Ambulatory Visit: Payer: Self-pay | Admitting: Nurse Practitioner

## 2017-07-04 ENCOUNTER — Ambulatory Visit: Payer: Self-pay | Attending: Nurse Practitioner

## 2017-07-04 DIAGNOSIS — E039 Hypothyroidism, unspecified: Secondary | ICD-10-CM | POA: Insufficient documentation

## 2017-07-04 MED FILL — LISINOPRIL 10 MG TABS: 10 | 30 days supply | Qty: 30 | Fill #1

## 2017-07-04 MED FILL — ?ATORVASTATIN 10MG TABLET: 10 | 30 days supply | Qty: 30 | Fill #1

## 2017-07-04 NOTE — Progress Notes (Signed)
Patient here for lab visit  

## 2017-07-05 LAB — TSH: TSH: 0.121 u[IU]/mL — AB (ref 0.450–4.500)

## 2017-07-11 MED FILL — ?METFORMIN HCL 1,000 MG TAB: 1000 | 30 days supply | Qty: 60 | Fill #1

## 2017-07-13 ENCOUNTER — Other Ambulatory Visit: Payer: Self-pay | Admitting: Nurse Practitioner

## 2017-07-13 DIAGNOSIS — E039 Hypothyroidism, unspecified: Secondary | ICD-10-CM

## 2017-07-13 MED ORDER — LEVOTHYROXINE SODIUM 88 MCG PO TABS: 88.0000 ug | ORAL_TABLET | Freq: Every day | ORAL | 1 refills | Status: DC

## 2017-07-14 ENCOUNTER — Telehealth: Payer: Self-pay | Admitting: Nurse Practitioner

## 2017-07-14 ENCOUNTER — Telehealth: Payer: Self-pay

## 2017-07-14 MED FILL — LEVOTHYROXINE 88 MCG TABLET: 88 | 30 days supply | Qty: 30 | Fill #0

## 2017-07-14 NOTE — Progress Notes (Signed)
Per lab, is her thyroid medication is decreased or increased.  Pt. Thyroid lab result fall below the ref range.

## 2017-07-14 NOTE — Telephone Encounter (Signed)
CMA call patient regarding lab results   Patient Verify DOB   Patient was aware and understood lab results

## 2017-07-14 NOTE — Progress Notes (Signed)
Can you make a call to the patient when you can. Thank you.

## 2017-07-14 NOTE — Telephone Encounter (Signed)
Patient called returning call from CMA Please follow up

## 2017-07-14 NOTE — Telephone Encounter (Signed)
Patient called requesting lab results. Please f/u with patient.

## 2017-07-14 NOTE — Telephone Encounter (Signed)
CMA call regarding lab results   Patient did not answer but left a VM stating to call back

## 2017-07-14 NOTE — Telephone Encounter (Signed)
-----   Message from Bien Yy, CMA sent at 07/14/2017 11:37 AM EDT ----- Can you make a call to the patient when you can. Thank you.  

## 2017-07-14 NOTE — Telephone Encounter (Signed)
-----   Message from [place], [place] sent at 07/14/2017 11:37 AM EDT ----- Can you make a call to the patient when you can. Thank you.

## 2017-07-17 ENCOUNTER — Encounter: Payer: Self-pay | Admitting: Family Medicine

## 2017-07-19 MED FILL — glipiZIDE XL 5 MG TB24: 5 | 30 days supply | Qty: 30 | Fill #2

## 2017-08-10 MED FILL — ?METFORMIN HCL 1,000 MG TAB: 1000 | 30 days supply | Qty: 60 | Fill #2

## 2017-08-10 MED FILL — LEVOTHYROXINE 88 MCG TABLET: 88 | 30 days supply | Qty: 30 | Fill #1

## 2017-08-10 MED FILL — LISINOPRIL 10 MG TABS: 10 | 30 days supply | Qty: 30 | Fill #2

## 2017-08-21 ENCOUNTER — Ambulatory Visit: Payer: No Typology Code available for payment source | Attending: Nurse Practitioner

## 2017-08-22 ENCOUNTER — Encounter: Payer: Self-pay | Admitting: Nurse Practitioner

## 2017-08-22 ENCOUNTER — Ambulatory Visit: Payer: Self-pay | Attending: Nurse Practitioner | Admitting: Nurse Practitioner

## 2017-08-22 VITALS — BP 148/83 | HR 82 | Temp 99.1°F | Ht 60.0 in | Wt 169.2 lb

## 2017-08-22 DIAGNOSIS — Z7984 Long term (current) use of oral hypoglycemic drugs: Secondary | ICD-10-CM | POA: Insufficient documentation

## 2017-08-22 DIAGNOSIS — Z791 Long term (current) use of non-steroidal anti-inflammatories (NSAID): Secondary | ICD-10-CM | POA: Insufficient documentation

## 2017-08-22 DIAGNOSIS — Z833 Family history of diabetes mellitus: Secondary | ICD-10-CM | POA: Insufficient documentation

## 2017-08-22 DIAGNOSIS — E1165 Type 2 diabetes mellitus with hyperglycemia: Secondary | ICD-10-CM | POA: Insufficient documentation

## 2017-08-22 DIAGNOSIS — F329 Major depressive disorder, single episode, unspecified: Secondary | ICD-10-CM | POA: Insufficient documentation

## 2017-08-22 DIAGNOSIS — E039 Hypothyroidism, unspecified: Secondary | ICD-10-CM | POA: Insufficient documentation

## 2017-08-22 DIAGNOSIS — I10 Essential (primary) hypertension: Secondary | ICD-10-CM | POA: Insufficient documentation

## 2017-08-22 DIAGNOSIS — E782 Mixed hyperlipidemia: Secondary | ICD-10-CM | POA: Insufficient documentation

## 2017-08-22 DIAGNOSIS — Z880 Allergy status to penicillin: Secondary | ICD-10-CM | POA: Insufficient documentation

## 2017-08-22 DIAGNOSIS — Z79899 Other long term (current) drug therapy: Secondary | ICD-10-CM | POA: Insufficient documentation

## 2017-08-22 DIAGNOSIS — Z8249 Family history of ischemic heart disease and other diseases of the circulatory system: Secondary | ICD-10-CM | POA: Insufficient documentation

## 2017-08-22 DIAGNOSIS — E11649 Type 2 diabetes mellitus with hypoglycemia without coma: Secondary | ICD-10-CM | POA: Insufficient documentation

## 2017-08-22 DIAGNOSIS — J301 Allergic rhinitis due to pollen: Secondary | ICD-10-CM | POA: Insufficient documentation

## 2017-08-22 DIAGNOSIS — K219 Gastro-esophageal reflux disease without esophagitis: Secondary | ICD-10-CM | POA: Insufficient documentation

## 2017-08-22 DIAGNOSIS — Z803 Family history of malignant neoplasm of breast: Secondary | ICD-10-CM | POA: Insufficient documentation

## 2017-08-22 LAB — GLUCOSE, POCT (MANUAL RESULT ENTRY): POC GLUCOSE: 125 mg/dL — AB (ref 70–99)

## 2017-08-22 MED ORDER — OLOPATADINE HCL 0.2 % OP SOLN: OPHTHALMIC | 1 refills | Status: DC

## 2017-08-22 MED ORDER — LISINOPRIL 10 MG PO TABS: 10.0000 mg | ORAL_TABLET | Freq: Every day | ORAL | 2 refills | Status: DC

## 2017-08-22 MED ORDER — ATORVASTATIN CALCIUM 10 MG PO TABS: 10.0000 mg | ORAL_TABLET | Freq: Every day | ORAL | 5 refills | Status: DC

## 2017-08-22 MED ORDER — GLIPIZIDE ER 5 MG PO TB24: 5.0000 mg | ORAL_TABLET | Freq: Every day | ORAL | 0 refills | Status: DC

## 2017-08-22 MED FILL — OLOPATADINE HCL 0.2 % SOLN: 0.2 | 9 days supply | Qty: 3 | Fill #0

## 2017-08-22 MED FILL — glipiZIDE XL 5 MG TB24: 5 | 30 days supply | Qty: 30 | Fill #0

## 2017-08-22 MED FILL — ?ATORVASTATIN 10MG TABLET: 10 | 30 days supply | Qty: 30 | Fill #0

## 2017-08-22 NOTE — Patient Instructions (Signed)
You can take Vitamin B or B complex for increased

## 2017-08-22 NOTE — Progress Notes (Signed)
Assessment & Plan:  Erin Huff was seen today for follow-up.  Diagnoses and all orders for this visit:  Hypothyroidism, unspecified type -     TSH  Uncontrolled type 2 diabetes mellitus with hyperglycemia, without long-term current use of insulin (HCC) -     Glucose (CBG) -     glipiZIDE (GLUCOTROL XL) 5 MG 24 hr tablet; Take 1 tablet (5 mg total) by mouth daily with breakfast. Continue blood sugar control as discussed in office today, low carbohydrate diet, and regular physical exercise as tolerated, 150 minutes per week (30 min each day, 5 days per week, or 50 min 3 days per week). Keep blood sugar logs with fasting goal of 80-130 mg/dl, post prandial less than 180.  For Hypoglycemia: BS <60 and Hyperglycemia BS >400; contact the clinic ASAP. Annual eye exams and foot exams are recommended.   Essential hypertension -     lisinopril (PRINIVIL,ZESTRIL) 10 MG tablet; Take 1 tablet (10 mg total) by mouth daily. Continue all antihypertensives as prescribed.  Remember to bring in your blood pressure log with you for your follow up appointment.  DASH/Mediterranean Diets are healthier choices for HTN.   Mixed hyperlipidemia -     atorvastatin (LIPITOR) 10 MG tablet; Take 1 tablet (10 mg total) by mouth daily at 6 PM. INSTRUCTIONS: Work on a low fat, heart healthy diet and participate in regular aerobic exercise program by working out at least 150 minutes per week. No fried foods. No junk foods, sodas, sugary drinks, unhealthy snacking, alcohol or smoking.    Allergic rhinitis due to pollen, unspecified seasonality -     Olopatadine HCl 0.2 % SOLN; Apply 1-2 drops per eye daily.    Patient has been counseled on age-appropriate routine health concerns for screening and prevention. These are reviewed and up-to-date. Referrals have been placed accordingly. Immunizations are up-to-date or declined.    Subjective:   Chief Complaint  Patient presents with  . Follow-up    Pt. is here for a  follow-up.    HPI Erin Huff 62 y.o. female presents to office today for follow up hypothyroidism, HTN and HPL. VRI was used to communicate directly with patient for the entire encounter including providing detailed patient instructions. She has complaints of "discomfort in her eyes". When I ask for clarification she states her eyes feel tired like when she is sleepy. She denies any visual disturbances. She has a medication box from [place] [facility]) that she is asking can she take this for energy. I have instructed her that this is an iron supplement and she does not need to take this as she is not iron deficient. I have instructed her that she can a MVI for her energy.   Hypothyroidism She has her synthroid with her today. Some of her pills are broken in half. I have instructed her to take her medication as prescribed, do not skip doses and do not stop taking synthroid unless instructed by her provider. Hypothyroid symptoms consist of fatigue. Symptoms have present for several weeks. The symptoms are mild to moderateThe problem has been stable.  Previous thyroid studies include TSH. The hypothyroidism is due to hypothyroidism.  CHRONIC HYPERTENSION Disease Monitoring  Blood pressure range: She does not monitor her blood pressure at home.  Slightly elevated today however she endorses medication compliance taking lisinopril '10mg'$  daily.  BP Readings from Last 3 Encounters:  08/22/17 (!) 148/83  06/02/17 (!) 144/74  05/23/17 138/81   Chest pain: no  Dyspnea: no   Claudication: no  Medication compliance: yes  Medication Side Effects  Lightheadedness: no   Urinary frequency: no   Edema: no   Impotence: no  Preventitive Healthcare:  Exercise: no   Diet Pattern: salt not added to cooking  Salt Restriction:  yes    Hyperlipidemia Patient presents for follow up to hyperlipidemia.  She is medication compliant taking atorvastatin '10mg'$  daily and Lovaza 1g BID. She is not  consistently diet compliant and denies skin xanthelasma or statin intolerance including myalgias. LDL is at goal.  Lab Results  Component Value Date   CHOL 120 05/26/2017   Lab Results  Component Value Date   HDL 43 05/26/2017   Lab Results  Component Value Date   LDLCALC 40 05/26/2017   Lab Results  Component Value Date   TRIG 187 (H) 05/26/2017   Lab Results  Component Value Date   CHOLHDL 2.8 05/26/2017   Review of Systems  Constitutional: Positive for malaise/fatigue. Negative for fever and weight loss.  HENT: Negative.  Negative for nosebleeds.   Eyes: Negative.  Negative for blurred vision, double vision and photophobia.  Respiratory: Negative.  Negative for cough and shortness of breath.   Cardiovascular: Negative.  Negative for chest pain, palpitations and leg swelling.  Gastrointestinal: Negative.  Negative for heartburn, nausea and vomiting.  Musculoskeletal: Negative.  Negative for myalgias.  Neurological: Negative.  Negative for dizziness, focal weakness, seizures and headaches.  Endo/Heme/Allergies: Positive for environmental allergies.  Psychiatric/Behavioral: Negative.  Negative for suicidal ideas.    Past Medical History:  Diagnosis Date  . Allergy   . Depression    mild  . Diabetes mellitus without complication ([place])   . GERD (gastroesophageal reflux disease)   . Heart murmur   . Hyperlipidemia   . Hypertension   . Thyroid disease     Past Surgical History:  Procedure Laterality Date  . NO PAST SURGERIES      Family History  Problem Relation Age of Onset  . Hypertension Sister   . Diabetes Brother   . Breast cancer Maternal Aunt   . Colon cancer Neg Hx   . Colon polyps Neg Hx   . Esophageal cancer Neg Hx   . Rectal cancer Neg Hx   . Stomach cancer Neg Hx     Social History Reviewed with no changes to be made today.   Outpatient Medications Prior to Visit  Medication Sig Dispense Refill  . Blood Glucose Monitoring Suppl (TRUE METRIX  METER) w/Device KIT Use as directed 1 kit 0  . glucose blood (TRUE METRIX BLOOD GLUCOSE TEST) test strip Use as instructed 100 each 12  . levothyroxine (SYNTHROID, LEVOTHROID) 88 MCG tablet Take 1 tablet (88 mcg total) by mouth daily before breakfast. 30 tablet 1  . metFORMIN (GLUCOPHAGE) 1000 MG tablet Take 1 tablet (1,000 mg total) by mouth 2 (two) times daily with a meal. 60 tablet 5  . omega-3 acid ethyl esters (LOVAZA) 1 g capsule Take 1 capsule (1 g total) by mouth 2 (two) times daily. 60 capsule 3  . atorvastatin (LIPITOR) 10 MG tablet Take 1 tablet (10 mg total) by mouth daily at 6 PM. 30 tablet 5  . glipiZIDE (GLUCOTROL XL) 5 MG 24 hr tablet Take 1 tablet (5 mg total) by mouth daily with breakfast. 90 tablet 0  . lisinopril (PRINIVIL,ZESTRIL) 10 MG tablet Take 1 tablet (10 mg total) by mouth daily. 30 tablet 2  . fexofenadine (ALLEGRA) 180 MG tablet Take  180 mg by mouth daily.    Marland Kitchen ibuprofen (ADVIL,MOTRIN) 600 MG tablet Take 1 tablet (600 mg total) by mouth every 8 (eight) hours as needed (Take with food.). Prn pain (Patient not taking: Reported on 08/22/2017) 30 tablet 1  . loratadine (CLARITIN) 10 MG tablet Take 1 tablet (10 mg total) by mouth daily. (Patient not taking: Reported on 08/22/2017) 30 tablet 11  . Multiple Vitamin (MULTIVITAMIN) tablet Take 1 tablet by mouth daily.    Marland Kitchen OVER THE COUNTER MEDICATION nutrilite carb blocker as needed- helps with constipation    . ranitidine (ZANTAC) 300 MG tablet Take 1 tablet (300 mg total) by mouth at bedtime. (Patient not taking: Reported on 03/31/2017) 30 tablet 2  . TRUEPLUS LANCETS 28G MISC Use as directed (Patient not taking: Reported on 05/09/2017) 100 each 5   Facility-Administered Medications Prior to Visit  Medication Dose Route Frequency Provider Last Rate Last Dose  . 0.9 %  sodium chloride infusion  500 mL Intravenous Continuous Pyrtle, Lajuan Lines, MD        Allergies  Allergen Reactions  . Penicillins Rash       Objective:    BP  (!) 148/83 (BP Location: Left Arm, Patient Position: Sitting, Cuff Size: Normal)   Pulse 82   Temp 99.1 F (37.3 C) (Oral)   Ht 5' (1.524 m)   Wt 169 lb 3.2 oz (76.7 kg)   SpO2 98%   BMI 33.04 kg/m  Wt Readings from Last 3 Encounters:  08/22/17 169 lb 3.2 oz (76.7 kg)  06/02/17 167 lb 4.8 oz (75.9 kg)  05/23/17 171 lb 12.8 oz (77.9 kg)    Physical Exam  Constitutional: She is oriented to person, place, and time. She appears well-developed and well-nourished. She is cooperative.  HENT:  Head: Normocephalic and atraumatic.  Eyes: EOM are normal. Right conjunctiva is injected. Left conjunctiva is injected.  Neck: Normal range of motion.  Cardiovascular: Normal rate and regular rhythm. Exam reveals no gallop and no friction rub.  Murmur heard. Pulmonary/Chest: Effort normal and breath sounds normal. No tachypnea. No respiratory distress. She has no decreased breath sounds. She has no wheezes. She has no rhonchi. She has no rales. She exhibits no tenderness.  Abdominal: Soft. Bowel sounds are normal.  Musculoskeletal: Normal range of motion. She exhibits no edema.  Neurological: She is alert and oriented to person, place, and time. Coordination normal.  Skin: Skin is warm and dry.  Psychiatric: She has a normal mood and affect. Her behavior is normal. Judgment and thought content normal.  Nursing note and vitals reviewed.      Patient has been counseled extensively about nutrition and exercise as well as the importance of adherence with medications and regular follow-up. The patient was given clear instructions to go to ER or return to medical center if symptoms don't improve, worsen or new problems develop. The patient verbalized understanding.   Follow-up: Return in about 6 weeks (around 10/02/2017) for DM HTN HPL.   Gildardo Pounds, FNP-BC [facility], [place]   08/22/2017, 8:32 PM

## 2017-08-23 ENCOUNTER — Other Ambulatory Visit: Payer: Self-pay | Admitting: Nurse Practitioner

## 2017-08-23 DIAGNOSIS — E039 Hypothyroidism, unspecified: Secondary | ICD-10-CM

## 2017-08-23 LAB — TSH: TSH: 3.06 u[IU]/mL (ref 0.450–4.500)

## 2017-08-23 MED ORDER — LEVOTHYROXINE SODIUM 88 MCG PO TABS: 88.0000 ug | ORAL_TABLET | Freq: Every day | ORAL | 1 refills | Status: DC

## 2017-08-25 ENCOUNTER — Telehealth: Payer: Self-pay | Admitting: Nurse Practitioner

## 2017-08-25 ENCOUNTER — Telehealth: Payer: Self-pay

## 2017-08-25 NOTE — Telephone Encounter (Signed)
Patient called and she is aware of her results .

## 2017-08-25 NOTE — Telephone Encounter (Signed)
-----   Message from Claiborne RiggZelda W Fleming, NP sent at 08/23/2017  9:27 AM EDT ----- Thyroid level is normal. Continue on levothyroxine 88mcg. Prescription has been sent to the pharmacy.

## 2017-08-25 NOTE — Telephone Encounter (Signed)
CMA attempted to call patient to inform on lab results.  No answer and left a VM for patient.  If patient call back, please inform:  Thyroid level is normal. Continue on levothyroxine . Prescription has been sent to the pharmacy.

## 2017-09-08 ENCOUNTER — Ambulatory Visit: Payer: No Typology Code available for payment source | Attending: Nurse Practitioner

## 2017-09-12 ENCOUNTER — Telehealth: Payer: Self-pay | Admitting: Nurse Practitioner

## 2017-09-12 MED FILL — ?METFORMIN HCL 1,000 MG TAB: 1000 | 30 days supply | Qty: 60 | Fill #3

## 2017-09-12 MED FILL — LISINOPRIL 10 MG TABS: 10 | 30 days supply | Qty: 30 | Fill #0

## 2017-09-12 NOTE — Telephone Encounter (Signed)
Patient came into the office and wants lisinopril and Metformin filled

## 2017-09-12 NOTE — Telephone Encounter (Signed)
PT HAD REFILLS @ [facility], SENT

## 2017-09-25 MED FILL — LEVOTHYROXINE 88 MCG TABLET: 88 | 30 days supply | Qty: 30 | Fill #0

## 2017-09-25 MED FILL — glipiZIDE XL 5 MG TB24: 5 | 30 days supply | Qty: 30 | Fill #1

## 2017-09-27 ENCOUNTER — Ambulatory Visit: Payer: Self-pay | Attending: Nurse Practitioner | Admitting: Physician Assistant

## 2017-09-27 VITALS — BP 132/84 | HR 75 | Temp 98.5°F | Resp 16 | Wt 168.0 lb

## 2017-09-27 DIAGNOSIS — Z789 Other specified health status: Secondary | ICD-10-CM

## 2017-09-27 DIAGNOSIS — E079 Disorder of thyroid, unspecified: Secondary | ICD-10-CM | POA: Insufficient documentation

## 2017-09-27 DIAGNOSIS — K0889 Other specified disorders of teeth and supporting structures: Secondary | ICD-10-CM | POA: Insufficient documentation

## 2017-09-27 DIAGNOSIS — Z79899 Other long term (current) drug therapy: Secondary | ICD-10-CM | POA: Insufficient documentation

## 2017-09-27 DIAGNOSIS — Z7984 Long term (current) use of oral hypoglycemic drugs: Secondary | ICD-10-CM | POA: Insufficient documentation

## 2017-09-27 DIAGNOSIS — E785 Hyperlipidemia, unspecified: Secondary | ICD-10-CM | POA: Insufficient documentation

## 2017-09-27 DIAGNOSIS — I10 Essential (primary) hypertension: Secondary | ICD-10-CM | POA: Insufficient documentation

## 2017-09-27 DIAGNOSIS — E1165 Type 2 diabetes mellitus with hyperglycemia: Secondary | ICD-10-CM | POA: Insufficient documentation

## 2017-09-27 DIAGNOSIS — Z880 Allergy status to penicillin: Secondary | ICD-10-CM | POA: Insufficient documentation

## 2017-09-27 DIAGNOSIS — Z79890 Hormone replacement therapy (postmenopausal): Secondary | ICD-10-CM | POA: Insufficient documentation

## 2017-09-27 DIAGNOSIS — F329 Major depressive disorder, single episode, unspecified: Secondary | ICD-10-CM | POA: Insufficient documentation

## 2017-09-27 LAB — GLUCOSE, POCT (MANUAL RESULT ENTRY): POC GLUCOSE: 172 mg/dL — AB (ref 70–99)

## 2017-09-27 NOTE — Progress Notes (Signed)
Patient ID: Erin Huff, female   DOB: 1956/01/17, 62 y.o.   MRN: [id number]    Erin Huff, is a 62 y.o. female  QQI:[id number]  HER:[id number]  DOB - 1955-05-28  Subjective:  Chief Complaint and HPI: Erin Huff is a 62 y.o. female here today for tooth pain on and off for a while but worse over the last 2 weeks.  No f/c.  Needs referral to the dentist.  Pain is relieved with ibuprofen.      ROS:   Constitutional:  No f/c, No night sweats, No unexplained weight loss. EENT:  No vision changes, No blurry vision, No hearing changes. No additional mouth, throat, or ear problems.  Respiratory: No cough, No SOB Cardiac: No CP, no palpitations GI:  No abd pain, No N/V/D. GU: No Urinary s/sx Musculoskeletal: No joint pain Neuro: No headache, no dizziness, no motor weakness.  Skin: No rash Endocrine:  No polydipsia. No polyuria.  Psych: Denies SI/HI  No problems updated.  ALLERGIES: Allergies  Allergen Reactions  . Penicillins Rash    PAST MEDICAL HISTORY: Past Medical History:  Diagnosis Date  . Allergy   . Depression    mild  . Diabetes mellitus without complication ([place])   . GERD (gastroesophageal reflux disease)   . Heart murmur   . Hyperlipidemia   . Hypertension   . Thyroid disease     MEDICATIONS AT HOME: Prior to Admission medications   Medication Sig Start Date End Date Taking? Authorizing Provider  atorvastatin (LIPITOR) 10 MG tablet Take 1 tablet (10 mg total) by mouth daily at 6 PM. 08/22/17   Gildardo Pounds, NP  Blood Glucose Monitoring Suppl (TRUE METRIX METER) w/Device KIT Use as directed 06/14/16   Tresa Garter, MD  fexofenadine (ALLEGRA) 180 MG tablet Take 180 mg by mouth daily.    [provider]  glipiZIDE (GLUCOTROL XL) 5 MG 24 hr tablet Take 1 tablet (5 mg total) by mouth daily with breakfast. 08/22/17   Gildardo Pounds, NP  glucose blood (TRUE METRIX BLOOD GLUCOSE TEST) test strip Use as  instructed 06/14/16   Tresa Garter, MD  ibuprofen (ADVIL,MOTRIN) 600 MG tablet Take 1 tablet (600 mg total) by mouth every 8 (eight) hours as needed (Take with food.). Prn pain Patient not taking: Reported on 08/22/2017 04/13/17   Argentina Donovan, PA-C  levothyroxine (SYNTHROID, LEVOTHROID) 88 MCG tablet Take 1 tablet (88 mcg total) by mouth daily before breakfast. 08/23/17   Gildardo Pounds, NP  lisinopril (PRINIVIL,ZESTRIL) 10 MG tablet Take 1 tablet (10 mg total) by mouth daily. 08/22/17   Gildardo Pounds, NP  loratadine (CLARITIN) 10 MG tablet Take 1 tablet (10 mg total) by mouth daily. Patient not taking: Reported on 08/22/2017 05/23/17   Gildardo Pounds, NP  metFORMIN (GLUCOPHAGE) 1000 MG tablet Take 1 tablet (1,000 mg total) by mouth 2 (two) times daily with a meal. 05/23/17   Gildardo Pounds, NP  Multiple Vitamin (MULTIVITAMIN) tablet Take 1 tablet by mouth daily.    [provider]  Olopatadine HCl 0.2 % SOLN Apply 1-2 drops per eye daily. 08/22/17   Gildardo Pounds, NP  omega-3 acid ethyl esters (LOVAZA) 1 g capsule Take 1 capsule (1 g total) by mouth 2 (two) times daily. 06/04/17   Gildardo Pounds, NP  OVER THE COUNTER MEDICATION nutrilite carb blocker as needed- helps with constipation    [provider]  ranitidine (ZANTAC) 300 MG tablet Take 1 tablet (  300 mg total) by mouth at bedtime. Patient not taking: Reported on 03/31/2017 09/26/16   Barnet Glasgow, NP  TRUEPLUS LANCETS 28G MISC Use as directed Patient not taking: Reported on 05/09/2017 06/14/16   Tresa Garter, MD     Objective:  EXAM:   Vitals:   09/27/17 1406  BP: 132/84  Pulse: 75  Resp: 16  Temp: 98.5 F (36.9 C)  TempSrc: Oral  SpO2: 99%  Weight: 168 lb (76.2 kg)    General appearance : A&OX3. NAD. Non-toxic-appearing HEENT: Atraumatic and Normocephalic.  PERRLA. EOM intact.  Poor dentition.  Lower right side there is a tooth with a cap-no sign of abscess.  No erythema, but this is the  one that is bothering her.  Mouth-MMM, post pharynx WNL w/o erythema, No PND. Neck: supple, no JVD. No cervical lymphadenopathy. No thyromegaly Chest/Lungs:  Breathing-non-labored, Good air entry bilaterally, breath sounds normal without rales, rhonchi, or wheezing  CVS: S1 S2 regular, no murmurs, gallops, rubs  Extremities: Bilateral Lower Ext shows no edema, both legs are warm to touch with = pulse throughout Neurology:  CN II-XII grossly intact, Non focal.   Psych:  TP linear. J/I WNL. Normal speech. Appropriate eye contact and affect.  Skin:  No Rash  Data Review Lab Results  Component Value Date   HGBA1C 6.9 03/31/2017   HGBA1C 7.1 03/09/2017   HGBA1C 7.5 12/02/2016     Assessment & Plan   1. Uncontrolled type 2 diabetes mellitus with hyperglycemia, without long-term current use of insulin ([place]) Has check up for DM next week with PCP.  Take all meds as directed.  Follow diabetic diet.   - Glucose (CBG)  2. Language barrier stratus interpreters used and additional time performing visit was required.  3. Tooth pain No sign of infection - Ambulatory referral to Dentistry     Patient have been counseled extensively about nutrition and exercise  Return for keep 10/03/2017 appt with [name] for DM f/up.  The patient was given clear instructions to go to ER or return to medical center if symptoms don't improve, worsen or new problems develop. The patient verbalized understanding. The patient was told to call to get lab results if they haven't heard anything in the next week.     Freeman Caldron, PA-C [facility] and [place], [place]   09/27/2017, 2:15 PM

## 2017-10-03 ENCOUNTER — Ambulatory Visit: Payer: Self-pay | Attending: Nurse Practitioner | Admitting: Nurse Practitioner

## 2017-10-03 ENCOUNTER — Encounter: Payer: Self-pay | Admitting: Nurse Practitioner

## 2017-10-03 VITALS — BP 132/82 | HR 83 | Temp 99.1°F | Ht 60.0 in | Wt 168.8 lb

## 2017-10-03 DIAGNOSIS — Z79899 Other long term (current) drug therapy: Secondary | ICD-10-CM | POA: Insufficient documentation

## 2017-10-03 DIAGNOSIS — E782 Mixed hyperlipidemia: Secondary | ICD-10-CM | POA: Insufficient documentation

## 2017-10-03 DIAGNOSIS — Z880 Allergy status to penicillin: Secondary | ICD-10-CM | POA: Insufficient documentation

## 2017-10-03 DIAGNOSIS — I10 Essential (primary) hypertension: Secondary | ICD-10-CM | POA: Insufficient documentation

## 2017-10-03 DIAGNOSIS — Z7984 Long term (current) use of oral hypoglycemic drugs: Secondary | ICD-10-CM | POA: Insufficient documentation

## 2017-10-03 DIAGNOSIS — K219 Gastro-esophageal reflux disease without esophagitis: Secondary | ICD-10-CM | POA: Insufficient documentation

## 2017-10-03 DIAGNOSIS — E1165 Type 2 diabetes mellitus with hyperglycemia: Secondary | ICD-10-CM | POA: Insufficient documentation

## 2017-10-03 DIAGNOSIS — E781 Pure hyperglyceridemia: Secondary | ICD-10-CM

## 2017-10-03 DIAGNOSIS — Z833 Family history of diabetes mellitus: Secondary | ICD-10-CM | POA: Insufficient documentation

## 2017-10-03 DIAGNOSIS — Z8249 Family history of ischemic heart disease and other diseases of the circulatory system: Secondary | ICD-10-CM | POA: Insufficient documentation

## 2017-10-03 DIAGNOSIS — Z79890 Hormone replacement therapy (postmenopausal): Secondary | ICD-10-CM | POA: Insufficient documentation

## 2017-10-03 DIAGNOSIS — E079 Disorder of thyroid, unspecified: Secondary | ICD-10-CM | POA: Insufficient documentation

## 2017-10-03 LAB — POCT GLYCOSYLATED HEMOGLOBIN (HGB A1C): Hemoglobin A1C: 6.9 % — AB (ref 4.0–5.6)

## 2017-10-03 LAB — GLUCOSE, POCT (MANUAL RESULT ENTRY): POC Glucose: 207 mg/dl — AB (ref 70–99)

## 2017-10-03 MED ORDER — METFORMIN HCL 1000 MG PO TABS: 1000.0000 mg | ORAL_TABLET | Freq: Two times a day (BID) | ORAL | 5 refills | Status: DC

## 2017-10-03 MED ORDER — MISC. DEVICES MISC: 0 refills | Status: DC

## 2017-10-03 MED ORDER — OMEGA-3-ACID ETHYL ESTERS 1 G PO CAPS: 1.0000 g | ORAL_CAPSULE | Freq: Two times a day (BID) | ORAL | 3 refills | Status: DC

## 2017-10-03 NOTE — Progress Notes (Signed)
Assessment & Plan:  Erin Huff was seen today for follow-up.  Diagnoses and all orders for this visit:  Uncontrolled type 2 diabetes mellitus with hyperglycemia, without long-term current use of insulin ([facility]) -     HgB A1c -     Glucose (CBG) -     metFORMIN (GLUCOPHAGE) 1000 MG tablet; Take 1 tablet (1,000 mg total) by mouth 2 (two) times daily with a meal. Continue blood sugar control as discussed in office today, low carbohydrate diet, and regular physical exercise as tolerated, 150 minutes per week (30 min each day, 5 days per week, or 50 min 3 days per week). Keep blood sugar logs with fasting goal of 90-130 mg/dl, post prandial (after you eat) less than 180.  For Hypoglycemia: BS <60 and Hyperglycemia BS >400; contact the clinic ASAP. Annual eye exams and foot exams are recommended.   Essential hypertension -     CMP14+EGFR Continue all antihypertensives as prescribed.  Remember to bring in your blood pressure log with you for your follow up appointment.  DASH/Mediterranean Diets are healthier choices for HTN.    Mixed hyperlipidemia -     Lipid panel  Uncontrolled type 2 diabetes mellitus with hyperglycemia, without long-term current use of insulin ([facility]) -     HgB A1c -     Glucose (CBG) -     metFORMIN (GLUCOPHAGE) 1000 MG tablet; Take 1 tablet (1,000 mg total) by mouth 2 (two) times daily with a meal.  Hypertriglyceridemia -     omega-3 acid ethyl esters (LOVAZA) 1 g capsule; Take 1 capsule (1 g total) by mouth 2 (two) times daily. INSTRUCTIONS: Work on a low fat, heart healthy diet and participate in regular aerobic exercise program by working out at least 150 minutes per week; 5 days a week-30 minutes per day. Avoid red meat, fried foods. junk foods, sodas, sugary drinks, unhealthy snacking, alcohol and smoking.  Drink at least 48oz of water per day and monitor your carbohydrate intake daily.      Patient has been counseled on age-appropriate routine health concerns  for screening and prevention. These are reviewed and up-to-date. Referrals have been placed accordingly. Immunizations are up-to-date or declined.    Subjective:   Chief Complaint  Patient presents with  . Follow-up    Pt. is here to follow-up on diabetes, hypertension, and cholesterol.    HPI Erin Huff 62 y.o. female presents to office today for DM, HTN and HPL.   Type 2 Diabetes Mellitus Disease course has been stable. There are no hypoglycemic symptoms. There are no hypoglycemic complications. Symptoms are stable. There are no diabetic complications. Risk factors for coronary artery disease include dyslipidemia, diabetes mellitus, obesity, hypertension, sedentary lifestyle and stress. Current diabetic treatment includes metformin 1000 mg twice daily, glipizide 5 mg daily. Patient is compliant with treatment all of the time and monitors blood glucose at home 1 time per day.   Home blood glucose trend : (FBS  130-140 mg/dl)  Weight is  stable. Patient follows a generally healthy diet. Meal planning includes avoidance of concentrated sweets. Patient has not seen a dietician. Patient is not compliant with exercise.   An ACE inhibitor/angiotensin II receptor blocker is being taken. Patient does not see a podiatrist. Eye exam is not current.  Lab Results  Component Value Date   HGBA1C 6.9 (A) 10/03/2017   Lab Results  Component Value Date   HGBA1C 6.9 03/31/2017   CHRONIC HYPERTENSION Disease Monitoring  Blood pressure range:  BP Readings from Last 3 Encounters:  10/03/17 132/82  09/27/17 132/84  08/22/17 (!) 148/83   Chest pain: no   Dyspnea: no   Claudication: no  Medication compliance: yes, taking lisinopril 10 mg daily Medication Side Effects  Lightheadedness: no   Urinary frequency: no   Edema: no   Impotence: no  Preventitive Healthcare:  Exercise: no   Diet Pattern: diet: general  Salt Restriction:  no   Hyperlipidemia Patient presents for follow up to  hyperlipidemia.  She is medication compliant is taking lipitor 20m and Omega 3 daily. She is diet compliant and denies skin xanthelasma or statin intolerance including myalgias.  Lab Results  Component Value Date   CHOL 120 05/26/2017   Lab Results  Component Value Date   HDL 43 05/26/2017   Lab Results  Component Value Date   LDLCALC 40 05/26/2017   Lab Results  Component Value Date   TRIG 187 (H) 05/26/2017   Lab Results  Component Value Date   CHOLHDL 2.8 05/26/2017   No results found for: LDLDIRECT Review of Systems  Constitutional: Negative for fever, malaise/fatigue and weight loss.  HENT: Negative.  Negative for nosebleeds.   Eyes: Negative.  Negative for blurred vision, double vision and photophobia.  Respiratory: Negative.  Negative for cough and shortness of breath.   Cardiovascular: Negative.  Negative for chest pain, palpitations and leg swelling.  Gastrointestinal: Negative.  Negative for heartburn, nausea and vomiting.  Musculoskeletal: Negative.  Negative for myalgias.  Neurological: Negative.  Negative for dizziness, focal weakness, seizures and headaches.  Psychiatric/Behavioral: Negative.  Negative for suicidal ideas.    Past Medical History:  Diagnosis Date  . Allergy   . Depression    mild  . Diabetes mellitus without complication ([place]   . GERD (gastroesophageal reflux disease)   . Heart murmur   . Hyperlipidemia   . Hypertension   . Thyroid disease     Past Surgical History:  Procedure Laterality Date  . NO PAST SURGERIES      Family History  Problem Relation Age of Onset  . Hypertension Sister   . Diabetes Brother   . Breast cancer Maternal Aunt   . Colon cancer Neg Hx   . Colon polyps Neg Hx   . Esophageal cancer Neg Hx   . Rectal cancer Neg Hx   . Stomach cancer Neg Hx     Social History Reviewed with no changes to be made today.   Outpatient Medications Prior to Visit  Medication Sig Dispense Refill  . atorvastatin  (LIPITOR) 10 MG tablet Take 1 tablet (10 mg total) by mouth daily at 6 PM. 90 tablet 5  . Blood Glucose Monitoring Suppl (TRUE METRIX METER) w/Device KIT Use as directed 1 kit 0  . glipiZIDE (GLUCOTROL XL) 5 MG 24 hr tablet Take 1 tablet (5 mg total) by mouth daily with breakfast. 90 tablet 0  . glucose blood (TRUE METRIX BLOOD GLUCOSE TEST) test strip Use as instructed 100 each 12  . levothyroxine (SYNTHROID, LEVOTHROID) 88 MCG tablet Take 1 tablet (88 mcg total) by mouth daily before breakfast. 30 tablet 1  . lisinopril (PRINIVIL,ZESTRIL) 10 MG tablet Take 1 tablet (10 mg total) by mouth daily. 90 tablet 2  . loratadine (CLARITIN) 10 MG tablet Take 1 tablet (10 mg total) by mouth daily. 30 tablet 11  . TRUEPLUS LANCETS 28G MISC Use as directed 100 each 5  . metFORMIN (GLUCOPHAGE) 1000 MG tablet Take 1 tablet (1,000 mg total)  by mouth 2 (two) times daily with a meal. 60 tablet 5  . omega-3 acid ethyl esters (LOVAZA) 1 g capsule Take 1 capsule (1 g total) by mouth 2 (two) times daily. 60 capsule 3  . fexofenadine (ALLEGRA) 180 MG tablet Take 180 mg by mouth daily.    Marland Kitchen ibuprofen (ADVIL,MOTRIN) 600 MG tablet Take 1 tablet (600 mg total) by mouth every 8 (eight) hours as needed (Take with food.). Prn pain (Patient not taking: Reported on 08/22/2017) 30 tablet 1  . Multiple Vitamin (MULTIVITAMIN) tablet Take 1 tablet by mouth daily.    . Olopatadine HCl 0.2 % SOLN Apply 1-2 drops per eye daily. (Patient not taking: Reported on 10/03/2017) 2.5 mL 1  . OVER THE COUNTER MEDICATION nutrilite carb blocker as needed- helps with constipation    . ranitidine (ZANTAC) 300 MG tablet Take 1 tablet (300 mg total) by mouth at bedtime. (Patient not taking: Reported on 03/31/2017) 30 tablet 2   Facility-Administered Medications Prior to Visit  Medication Dose Route Frequency Provider Last Rate Last Dose  . 0.9 %  sodium chloride infusion  500 mL Intravenous Continuous Pyrtle, Lajuan Lines, MD        Allergies  Allergen  Reactions  . Penicillins Rash       Objective:    BP 132/82 (BP Location: Right Arm, Patient Position: Sitting, Cuff Size: Large)   Pulse 83   Temp 99.1 F (37.3 C) (Oral)   Ht 5' (1.524 m)   Wt 168 lb 12.8 oz (76.6 kg)   SpO2 98%   BMI 32.97 kg/m  Wt Readings from Last 3 Encounters:  10/03/17 168 lb 12.8 oz (76.6 kg)  09/27/17 168 lb (76.2 kg)  08/22/17 169 lb 3.2 oz (76.7 kg)    Physical Exam  Constitutional: She is oriented to person, place, and time. She appears well-developed and well-nourished. She is cooperative.  HENT:  Head: Normocephalic and atraumatic.  Mouth/Throat: Oral lesions present. Abnormal dentition. Dental abscesses present.  Eyes: EOM are normal.  Neck: Normal range of motion.  Cardiovascular: Normal rate, regular rhythm, normal heart sounds and intact distal pulses. Exam reveals no gallop and no friction rub.  No murmur heard. Pulmonary/Chest: Effort normal and breath sounds normal. No tachypnea. No respiratory distress. She has no decreased breath sounds. She has no wheezes. She has no rhonchi. She has no rales. She exhibits no tenderness.  Abdominal: Bowel sounds are normal.  Musculoskeletal: Normal range of motion. She exhibits no edema.  Neurological: She is alert and oriented to person, place, and time. Coordination normal.  Skin: Skin is warm and dry.  Psychiatric: She has a normal mood and affect. Her behavior is normal. Judgment and thought content normal.  Nursing note and vitals reviewed.      Patient has been counseled extensively about nutrition and exercise as well as the importance of adherence with medications and regular follow-up. The patient was given clear instructions to go to ER or return to medical center if symptoms don't improve, worsen or new problems develop. The patient verbalized understanding.   Follow-up: Return in about 3 months (around 01/03/2018) for TSH and Lipids.   Gildardo Pounds, FNP-BC [facility] and [place], [place]   10/03/2017, 7:31 PM

## 2017-10-03 NOTE — Patient Instructions (Signed)
[facility] ph. # [phone]

## 2017-10-04 LAB — LIPID PANEL
CHOLESTEROL TOTAL: 165 mg/dL (ref 100–199)
Chol/HDL Ratio: 3.1 ratio (ref 0.0–4.4)
HDL: 54 mg/dL (ref >39)
LDL Calculated: 73 mg/dL (ref 0–99)
Triglycerides: 190 mg/dL — ABNORMAL HIGH (ref 0–149)
VLDL Cholesterol Cal: 38 mg/dL (ref 5–40)

## 2017-10-04 LAB — CMP14+EGFR
A/G RATIO: 1.6 (ref 1.2–2.2)
ALK PHOS: 117 IU/L (ref 39–117)
ALT: 20 IU/L (ref 0–32)
AST: 20 IU/L (ref 0–40)
Albumin: 4.5 g/dL (ref 3.6–4.8)
BUN/Creatinine Ratio: 23 (ref 12–28)
BUN: 15 mg/dL (ref 8–27)
Bilirubin Total: 0.3 mg/dL (ref 0.0–1.2)
CHLORIDE: 100 mmol/L (ref 96–106)
CO2: 22 mmol/L (ref 20–29)
Calcium: 9.5 mg/dL (ref 8.7–10.3)
Creatinine, Ser: 0.66 mg/dL (ref 0.57–1.00)
GFR calc Af Amer: 111 mL/min/{1.73_m2} (ref >59)
GFR calc non Af Amer: 96 mL/min/{1.73_m2} (ref >59)
GLOBULIN, TOTAL: 2.9 g/dL (ref 1.5–4.5)
Glucose: 199 mg/dL — ABNORMAL HIGH (ref 65–99)
POTASSIUM: 4.7 mmol/L (ref 3.5–5.2)
Sodium: 135 mmol/L (ref 134–144)
Total Protein: 7.4 g/dL (ref 6.0–8.5)

## 2017-10-04 MED FILL — OMEGA-3 ETHYL ESTERS 1 GM C: 1 | 30 days supply | Qty: 60 | Fill #0

## 2017-10-04 MED FILL — !TRUE METRIX BLOOD GLUCOSE: 365 days supply | Qty: 1 | Fill #0

## 2017-10-04 MED FILL — TRUE METRIX TEST STRIP: 50 days supply | Qty: 50 | Fill #0

## 2017-10-04 MED FILL — TRUEplus LANCETS 28G MISC: 100 days supply | Qty: 100 | Fill #0

## 2017-10-12 ENCOUNTER — Telehealth: Payer: Self-pay

## 2017-10-12 NOTE — Telephone Encounter (Signed)
CMA spoke to patient to inform on lab results.  Patient verified DOB. Patient understood.  Spanish interpreter Jonetta SpeakLuis [phone] assist with the call.

## 2017-10-12 NOTE — Telephone Encounter (Signed)
-----   Message from Claiborne RiggZelda W Fleming, NP sent at 10/12/2017 11:01 AM EDT ----- Labs are essentially normal. There are some minor variations in your blood work that do not require any additional work up at this time. Your cholesterol levels are slightly elevated. INSTRUCTIONS: Work on a low fat, heart healthy diet and participate in regular aerobic exercise program by working out at least 150 minutes per week; 5 days a week-30 minutes per day. Avoid red meat, fried foods. junk foods, sodas, sugary drinks, unhealthy snacking, alcohol and smoking.  Drink at least 48oz of water per day and monitor your carbohydrate intake daily.

## 2017-10-19 MED FILL — ?ATORVASTATIN 10 MG TABLET: 10 | 30 days supply | Qty: 30 | Fill #1

## 2017-10-19 MED FILL — LISINOPRIL 10 MG TABS: 10 | 30 days supply | Qty: 30 | Fill #1

## 2017-10-19 MED FILL — ?METFORMIN HCL 1000MG TABS: 1000 | 30 days supply | Qty: 60 | Fill #4

## 2017-10-20 ENCOUNTER — Ambulatory Visit: Payer: No Typology Code available for payment source | Admitting: Obstetrics & Gynecology

## 2017-10-27 ENCOUNTER — Telehealth: Payer: Self-pay | Admitting: Nurse Practitioner

## 2017-10-27 MED FILL — LEVOTHYROXINE 88 MCG TABLET: 88 | 30 days supply | Qty: 30 | Fill #1

## 2017-10-27 MED FILL — glipiZIDE XL 5 MG TB24: 5 | 30 days supply | Qty: 30 | Fill #2

## 2017-10-27 NOTE — Telephone Encounter (Signed)
error 

## 2017-11-24 ENCOUNTER — Other Ambulatory Visit: Payer: Self-pay | Admitting: Nurse Practitioner

## 2017-11-24 DIAGNOSIS — E039 Hypothyroidism, unspecified: Secondary | ICD-10-CM

## 2017-11-24 DIAGNOSIS — E1165 Type 2 diabetes mellitus with hyperglycemia: Secondary | ICD-10-CM

## 2017-11-24 MED FILL — LEVOTHYROXINE 88 MCG TABLET: 88 | 30 days supply | Qty: 30 | Fill #0

## 2017-11-24 MED FILL — ?METFORMIN HCL 1000MG TABS: 1000 | 30 days supply | Qty: 60 | Fill #5

## 2017-11-24 MED FILL — ?ATORVASTATIN 10 MG TABLET: 10 | 30 days supply | Qty: 30 | Fill #2

## 2017-11-24 MED FILL — glipiZIDE XL 5 MG TB24: 5 | 30 days supply | Qty: 30 | Fill #0

## 2017-12-01 MED FILL — LISINOPRIL 10 MG TABS: 10 | 30 days supply | Qty: 30 | Fill #2

## 2017-12-06 MED FILL — ?LORATADINE 10 MG TABS: 10 | 30 days supply | Qty: 30 | Fill #0

## 2017-12-29 MED FILL — ?METFORMIN HCL 1000MG TABS: 1000 | 30 days supply | Qty: 60 | Fill #0

## 2018-01-05 ENCOUNTER — Encounter: Payer: Self-pay | Admitting: Nurse Practitioner

## 2018-01-05 ENCOUNTER — Ambulatory Visit: Payer: Self-pay | Attending: Nurse Practitioner | Admitting: Nurse Practitioner

## 2018-01-05 VITALS — BP 149/87 | HR 90 | Temp 98.6°F | Ht 60.0 in | Wt 174.0 lb

## 2018-01-05 DIAGNOSIS — E782 Mixed hyperlipidemia: Secondary | ICD-10-CM | POA: Insufficient documentation

## 2018-01-05 DIAGNOSIS — Z880 Allergy status to penicillin: Secondary | ICD-10-CM | POA: Insufficient documentation

## 2018-01-05 DIAGNOSIS — Z791 Long term (current) use of non-steroidal anti-inflammatories (NSAID): Secondary | ICD-10-CM | POA: Insufficient documentation

## 2018-01-05 DIAGNOSIS — K219 Gastro-esophageal reflux disease without esophagitis: Secondary | ICD-10-CM | POA: Insufficient documentation

## 2018-01-05 DIAGNOSIS — F329 Major depressive disorder, single episode, unspecified: Secondary | ICD-10-CM | POA: Insufficient documentation

## 2018-01-05 DIAGNOSIS — Z7984 Long term (current) use of oral hypoglycemic drugs: Secondary | ICD-10-CM | POA: Insufficient documentation

## 2018-01-05 DIAGNOSIS — E1165 Type 2 diabetes mellitus with hyperglycemia: Secondary | ICD-10-CM | POA: Insufficient documentation

## 2018-01-05 DIAGNOSIS — J309 Allergic rhinitis, unspecified: Secondary | ICD-10-CM | POA: Insufficient documentation

## 2018-01-05 DIAGNOSIS — I10 Essential (primary) hypertension: Secondary | ICD-10-CM | POA: Insufficient documentation

## 2018-01-05 DIAGNOSIS — E039 Hypothyroidism, unspecified: Secondary | ICD-10-CM | POA: Insufficient documentation

## 2018-01-05 DIAGNOSIS — R51 Headache: Secondary | ICD-10-CM | POA: Insufficient documentation

## 2018-01-05 DIAGNOSIS — Z79899 Other long term (current) drug therapy: Secondary | ICD-10-CM | POA: Insufficient documentation

## 2018-01-05 DIAGNOSIS — G44219 Episodic tension-type headache, not intractable: Secondary | ICD-10-CM

## 2018-01-05 DIAGNOSIS — Z886 Allergy status to analgesic agent status: Secondary | ICD-10-CM | POA: Insufficient documentation

## 2018-01-05 LAB — GLUCOSE, POCT (MANUAL RESULT ENTRY): POC Glucose: 109 mg/dl — AB (ref 70–99)

## 2018-01-05 MED ORDER — LISINOPRIL 10 MG PO TABS: 10.0000 mg | ORAL_TABLET | Freq: Every day | ORAL | 2 refills | Status: DC

## 2018-01-05 MED ORDER — LORATADINE 10 MG PO TABS: 10.0000 mg | ORAL_TABLET | Freq: Every day | ORAL | 11 refills | Status: DC

## 2018-01-05 MED ORDER — BUTALBITAL-APAP-CAFFEINE 50-325-40 MG PO TABS: 1.0000 | ORAL_TABLET | Freq: Four times a day (QID) | ORAL | 0 refills | Status: AC | PRN

## 2018-01-05 MED ORDER — TRUEPLUS LANCETS 28G MISC: 5 refills | Status: DC

## 2018-01-05 MED ORDER — GLUCOSE BLOOD VI STRP: ORAL_STRIP | 12 refills | Status: DC

## 2018-01-05 MED ORDER — GLIPIZIDE ER 5 MG PO TB24: 5.0000 mg | ORAL_TABLET | Freq: Every day | ORAL | 1 refills | Status: DC

## 2018-01-05 MED ORDER — ATORVASTATIN CALCIUM 10 MG PO TABS: 10.0000 mg | ORAL_TABLET | Freq: Every day | ORAL | 5 refills | Status: DC

## 2018-01-05 MED ORDER — METFORMIN HCL 1000 MG PO TABS: 1000.0000 mg | ORAL_TABLET | Freq: Two times a day (BID) | ORAL | 5 refills | Status: DC

## 2018-01-05 MED FILL — ?ATORVASTATIN 10 MG TABLET: 10 | 30 days supply | Qty: 30 | Fill #3

## 2018-01-05 MED FILL — LISINOPRIL 10 MG TABS: 10 | 30 days supply | Qty: 30 | Fill #3

## 2018-01-05 NOTE — Progress Notes (Signed)
Assessment & Plan:  Diagnoses and all orders for this visit:  Uncontrolled type 2 diabetes mellitus with hyperglycemia, without long-term current use of insulin (HCC) -     Glucose (CBG) -     Lipid panel -     glipiZIDE (GLIPIZIDE XL) 5 MG 24 hr tablet; Take 1 tablet (5 mg total) by mouth daily with breakfast. -     metFORMIN (GLUCOPHAGE) 1000 MG tablet; Take 1 tablet (1,000 mg total) by mouth 2 (two) times daily with a meal. -     TRUEPLUS LANCETS 28G MISC; Use as directed -     glucose blood (TRUE METRIX BLOOD GLUCOSE TEST) test strip; Use as instructed Continue blood sugar control as discussed in office today, low carbohydrate diet, and regular physical exercise as tolerated, 150 minutes per week (30 min each day, 5 days per week, or 50 min 3 days per week). Keep blood sugar logs with fasting goal of 90-130 mg/dl, post prandial (after you eat) less than 180.  For Hypoglycemia: BS <60 and Hyperglycemia BS >400; contact the clinic ASAP. Annual eye exams and foot exams are recommended.   Hypothyroidism, unspecified type -     TSH  Allergic rhinitis, unspecified seasonality, unspecified trigger -     loratadine (CLARITIN) 10 MG tablet; Take 1 tablet (10 mg total) by mouth daily.  Essential hypertension -     lisinopril (PRINIVIL,ZESTRIL) 10 MG tablet; Take 1 tablet (10 mg total) by mouth daily. Continue all antihypertensives as prescribed.  Remember to bring in your blood pressure log with you for your follow up appointment.  DASH/Mediterranean Diets are healthier choices for HTN.   Mixed hyperlipidemia -     atorvastatin (LIPITOR) 10 MG tablet; Take 1 tablet (10 mg total) by mouth daily at 6 PM. LDL at goal.  Lab Results  Component Value Date   LDLCALC 73 10/03/2017  INSTRUCTIONS: Work on a low fat, heart healthy diet and participate in regular aerobic exercise program by working out at least 150 minutes per week; 5 days a week-30 minutes per day. Avoid red meat, fried foods.  junk foods, sodas, sugary drinks, unhealthy snacking, alcohol and smoking.  Drink at least 48oz of water per day and monitor your carbohydrate intake daily.    Episodic tension-type headache, not intractable -     butalbital-acetaminophen-caffeine (FIORICET, ESGIC) 50-325-40 MG tablet; Take 1 tablet by mouth every 6 (six) hours as needed for headache. Avoid migraine triggers such as caffeine, smoking, MSG, aged cheeses and salty foods   Patient has been counseled on age-appropriate routine health concerns for screening and prevention. These are reviewed and up-to-date. Referrals have been placed accordingly. Immunizations are up-to-date or declined.    Subjective:  No chief complaint on file.  HPI Erin Huff 62 y.o. female presents to office today for follow up. VRI was used to communicate directly with patient for the entire encounter including providing detailed patient instructions.    Diabetes Mellitus Type 2 Chronic and well controlled. She endorses medication compliance taking metformin 1000 mg BID and glipizide 5 mg daily. She denies any hypo or hyperglycemic symptoms.  She is not monitoring her blood glucose levels routinely. I have requested she check her blood glucose levels at least once daily. Her weight is up. We discussed dietary modifications in regards to carbs and simple sugars.  She is overdue for eye exam.Patient has been advised to apply for financial assistance and schedule to see our financial counselor.  Lab Results  Component Value Date   HGBA1C 6.9 (A) 10/03/2017   Allergic rhinitis She takes claritin as needed. States she notices a rash around her neck and lips when she takes ibuprofen for her headaches but the rash goes away if she takes claritin. I have instructed her to take claritin every day as prescribed and to stop taking ibuprofen as this may be causing an allergic reaction.   Headaches Chronic. Sporadic with no aggravating factors. Relieving  factors: ibuprofen '600mg'$  however she has been experiencing a facial rash after taking ibuprofen. I have instructed her to stop taking ibuprofen and will prescribe a migraine medication for her headaches. Associated symptoms: none. Headaches are temporal and described as throbbing and aching.   Hypothyroidism TSH has been abnormal over the past 6 months however most recent TSH was normal. She endorses medication compliance taking synthroid 37mg daily. She denies fatigue, weight changes, heat/cold intolerance, bowel/skin changes or CVS symptoms.  Lab Results  Component Value Date   TSH 3.060 08/22/2017   Essential Hypertension Chronic. She endorses medication compliance taking lisinopril '10mg'$  daily. Denies chest pain, shortness of breath, palpitations, lightheadedness, dizziness, headaches or BLE edema. She is not diet or exercise compliant. I will check her TSH level prior to making any adjustments to her lisinopril.  BP Readings from Last 3 Encounters:  01/05/18 (!) 149/87  10/03/17 132/82  09/27/17 132/84    Review of Systems  Constitutional: Negative for fever, malaise/fatigue and weight loss.  HENT: Negative.  Negative for nosebleeds.   Eyes: Negative.  Negative for blurred vision, double vision and photophobia.  Respiratory: Negative.  Negative for cough and shortness of breath.   Cardiovascular: Negative.  Negative for chest pain, palpitations and leg swelling.  Gastrointestinal: Positive for heartburn. Negative for nausea and vomiting.  Musculoskeletal: Negative.  Negative for myalgias.  Neurological: Positive for headaches. Negative for dizziness, focal weakness and seizures.  Endo/Heme/Allergies: Positive for environmental allergies.  Psychiatric/Behavioral: Negative.  Negative for suicidal ideas.    Past Medical History:  Diagnosis Date  . Allergy   . Depression    mild  . Diabetes mellitus without complication ([place]   . GERD (gastroesophageal reflux disease)   . Heart  murmur   . Hyperlipidemia   . Hypertension   . Thyroid disease     Past Surgical History:  Procedure Laterality Date  . NO PAST SURGERIES      Family History  Problem Relation Age of Onset  . Hypertension Sister   . Diabetes Brother   . Breast cancer Maternal Aunt   . Colon cancer Neg Hx   . Colon polyps Neg Hx   . Esophageal cancer Neg Hx   . Rectal cancer Neg Hx   . Stomach cancer Neg Hx     Social History Reviewed with no changes to be made today.   Outpatient Medications Prior to Visit  Medication Sig Dispense Refill  . Blood Glucose Monitoring Suppl (TRUE METRIX METER) w/Device KIT Use as directed 1 kit 0  . levothyroxine (SYNTHROID, LEVOTHROID) 88 MCG tablet TAKE 1 TABLET (88 MCG TOTAL) BY MOUTH DAILY BEFORE BREAKFAST. 30 tablet 1  . Multiple Vitamin (MULTIVITAMIN) tablet Take 1 tablet by mouth daily.    .Marland Kitchenatorvastatin (LIPITOR) 10 MG tablet Take 1 tablet (10 mg total) by mouth daily at 6 PM. 90 tablet 5  . fexofenadine (ALLEGRA) 180 MG tablet Take 180 mg by mouth daily.    .Marland KitchenGLIPIZIDE XL 5 MG 24 hr tablet TAKE 1 TABLET BY  MOUTH DAILY WITH BREAKFAST. 90 tablet 1  . glucose blood (TRUE METRIX BLOOD GLUCOSE TEST) test strip Use as instructed 100 each 12  . lisinopril (PRINIVIL,ZESTRIL) 10 MG tablet Take 1 tablet (10 mg total) by mouth daily. 90 tablet 2  . loratadine (CLARITIN) 10 MG tablet Take 1 tablet (10 mg total) by mouth daily. 30 tablet 11  . metFORMIN (GLUCOPHAGE) 1000 MG tablet Take 1 tablet (1,000 mg total) by mouth 2 (two) times daily with a meal. 60 tablet 5  . TRUEPLUS LANCETS 28G MISC Use as directed 100 each 5  . Olopatadine HCl 0.2 % SOLN Apply 1-2 drops per eye daily. (Patient not taking: Reported on 10/03/2017) 2.5 mL 1  . omega-3 acid ethyl esters (LOVAZA) 1 g capsule Take 1 capsule (1 g total) by mouth 2 (two) times daily. (Patient not taking: Reported on 01/05/2018) 60 capsule 3  . OVER THE COUNTER MEDICATION nutrilite carb blocker as needed- helps with  constipation    . ibuprofen (ADVIL,MOTRIN) 600 MG tablet Take 1 tablet (600 mg total) by mouth every 8 (eight) hours as needed (Take with food.). Prn pain (Patient not taking: Reported on 08/22/2017) 30 tablet 1   Facility-Administered Medications Prior to Visit  Medication Dose Route Frequency Provider Last Rate Last Dose  . 0.9 %  sodium chloride infusion  500 mL Intravenous Continuous Pyrtle, Lajuan Lines, MD        Allergies  Allergen Reactions  . Motrin [Ibuprofen] Rash  . Penicillins Rash       Objective:    BP (!) 149/87 (BP Location: Left Arm, Patient Position: Sitting, Cuff Size: Normal)   Pulse 90   Temp 98.6 F (37 C) (Oral)   Ht 5' (1.524 m)   Wt 174 lb (78.9 kg)   SpO2 98%   BMI 33.98 kg/m  Wt Readings from Last 3 Encounters:  01/05/18 174 lb (78.9 kg)  10/03/17 168 lb 12.8 oz (76.6 kg)  09/27/17 168 lb (76.2 kg)    Physical Exam  Constitutional: She is oriented to person, place, and time. She appears well-developed and well-nourished. She is cooperative.  HENT:  Head: Normocephalic and atraumatic.  Eyes: Pupils are equal, round, and reactive to light. EOM and lids are normal.  Neck: Normal range of motion.  Cardiovascular: Normal rate, regular rhythm and normal heart sounds. Exam reveals no gallop and no friction rub.  No murmur heard. Pulmonary/Chest: Effort normal and breath sounds normal. No tachypnea. No respiratory distress. She has no decreased breath sounds. She has no wheezes. She has no rhonchi. She has no rales. She exhibits no tenderness.  Abdominal: Bowel sounds are normal.  Musculoskeletal: Normal range of motion. She exhibits no edema.  Neurological: She is alert and oriented to person, place, and time. She has normal strength. No cranial nerve deficit or sensory deficit. Coordination and gait normal.  Skin: Skin is warm and dry.  Psychiatric: She has a normal mood and affect. Her behavior is normal. Judgment and thought content normal.  Nursing note  and vitals reviewed.        Patient has been counseled extensively about nutrition and exercise as well as the importance of adherence with medications and regular follow-up. The patient was given clear instructions to go to ER or return to medical center if symptoms don't improve, worsen or new problems develop. The patient verbalized understanding.   Follow-up: Return in about 3 months (around 04/07/2018) for DM, Fasting labs.   Gildardo Pounds, FNP-BC  [facility] and [facility], [place]   01/06/2018, 12:01 AM

## 2018-01-05 NOTE — Patient Instructions (Addendum)
Rinitis alrgica en adultos  Allergic Rhinitis, Adult  La rinitis alrgica es una reaccin alrgica que afecta la membrana mucosa que se encuentra en la nariz. Provoca estornudos, goteo o congestin nasal, y la sensacin de que baja mucosidad por la parte trasera de la garganta(goteo posnasal). La rinitis alrgica puede ser de leve a grave.  Existen dos tipos de rinitis alrgica:   Estacional. Este tipo tambin se denomina fiebre del heno. Sucede nicamente durante algunas estaciones.   Perenne. Este tipo puede ocurrir en cualquier momento del ao.    Cules son las causas?  Esta afeccin ocurre cuando el sistema de defensa del cuerpo(sistema inmunitario) reacciona a ciertas sustancias inofensivas llamadas alrgenos como si fueran grmenes.   La rinitis alrgica estacional se desencadena por el polen, que puede provenir del csped, los rboles y las malezas. La rinitis alrgica perenne puede ser causada por:   Los caros del polvo en el hogar.   La caspa de las mascotas.   Esporas del moho.    Cules son los signos o los sntomas?  Los sntomas de esta afeccin incluyen lo siguiente:   Estornudos.   Nariz tapada o que gotea (congestin nasal).   Goteo posnasal.   Escozor en la nariz.   Ojos llorosos.   Dificultad para dormir.   Somnolencia durante el da.    Cmo se diagnostica?  Esta afeccin se puede diagnosticar en funcin de lo siguiente:   Sus antecedentes mdicos.   Un examen fsico.   Estudios para detectar afecciones relacionadas, como las siguientes:  ? Asma.  ? Ojo rojo.  ? Infeccin en los odos.  ? Infeccin de las vas respiratorias superiores.   Estudios para averiguar qu alergias desencadenan sus sntomas. Por ejemplo, anlisis de sangre y cutneos.    Cmo se trata?  No hay cura para esta afeccin, pero el tratamiento puede ayudar a controlar los sntomas. El tratamiento puede incluir lo siguiente:   Tomar medicamentos que inhiben los sntomas de la alergia, como los  antihistamnicos. Medicamentos que pueden administrarse por inyeccin, aerosol nasal o pldoras.   Evitar el alrgeno.   Desensibilizacin. Este tratamiento implica que se le apliquen inyecciones continuas hasta que su cuerpo se vuelva menos sensible al alrgeno. Este tratamiento se puede realizar si otros tratamientos no son eficaces.   Si tomar medicamentos y evitar el alrgeno no funciona, se pueden recetar nuevos medicamentos ms fuertes.    Siga estas indicaciones en su casa:   Conozca a qu es alrgico. Los alrgenos comunes incluyen el humo, polvo y polen.   Evite las cosas a las cuales es alrgico. Hay medidas que puede tomar para ayudar a evitar los alrgenos:  ? Reemplace las alfombras por pisos de madera, baldosas o vinilo. Las alfombras pueden retener la caspa de los animales y el polvo.  ? No fume. No permita que fumen en su casa.  ? Cambie el filtro de la calefaccin y del aire acondicionado al menos una vez al mes.  ? Durante la temporada de alergias:   Mantenga las ventanas cerradas la mayor cantidad de tiempo posible.   Planee actividades al aire libre cuando las concentraciones de polen estn en su nivel ms bajo. Normalmente, esto es durante las horas de noche.   Cuando ingrese al interior, cmbiese de ropa y dese una ducha antes de sentarse en los muebles o la cama.   Tome los medicamentos de venta libre y los recetados solamente como se lo haya indicado el mdico.   Concurra   a todas las visitas de seguimiento como se lo haya indicado el mdico. Esto es importante.  Comunquese con un mdico si:   Tiene fiebre.   Tiene tos persistente.   Comienza a emitir un sonido agudo al respirar (sibilancia).   Sus sntomas interfieren con sus actividades diarias normales.  Solicite ayuda de inmediato si:   Le falta el aire.  Resumen   Esta afeccin puede controlarse al tomar medicamentos como se le indique y al evitar los alrgenos.   Comunquese con su mdico si tiene tos persistente o  fiebre.   Durante la temporada de alergias, mantenga las ventanas cerradas la mayor cantidad de tiempo posible.  Esta informacin no tiene como fin reemplazar el consejo del mdico. Asegrese de hacerle al mdico cualquier pregunta que tenga.  Document Released: 11/17/2004 Document Revised: 05/25/2016 Document Reviewed: 05/25/2016  Elsevier Interactive Patient Education  2018 [employer]

## 2018-01-06 ENCOUNTER — Encounter: Payer: Self-pay | Admitting: Nurse Practitioner

## 2018-01-06 LAB — LIPID PANEL
CHOLESTEROL TOTAL: 165 mg/dL (ref 100–199)
Chol/HDL Ratio: 3.1 ratio (ref 0.0–4.4)
HDL: 53 mg/dL (ref >39)
LDL CALC: 40 mg/dL (ref 0–99)
TRIGLYCERIDES: 360 mg/dL — AB (ref 0–149)
VLDL CHOLESTEROL CAL: 72 mg/dL — AB (ref 5–40)

## 2018-01-06 LAB — TSH: TSH: 22.32 u[IU]/mL — ABNORMAL HIGH (ref 0.450–4.500)

## 2018-01-08 ENCOUNTER — Other Ambulatory Visit: Payer: Self-pay | Admitting: Nurse Practitioner

## 2018-01-08 DIAGNOSIS — E039 Hypothyroidism, unspecified: Secondary | ICD-10-CM

## 2018-01-08 MED ORDER — LEVOTHYROXINE SODIUM 100 MCG PO TABS: 100.0000 ug | ORAL_TABLET | Freq: Every day | ORAL | 1 refills | Status: DC

## 2018-01-08 MED FILL — ?GLIPIZDE ER 5 MG TB24: 5 | 30 days supply | Qty: 30 | Fill #0

## 2018-01-08 MED FILL — BUTALB-ACETAMIN-CAFF 50-325: 50-325-40 | 5 days supply | Qty: 20 | Fill #0

## 2018-01-08 MED FILL — TRUEplus LANCETS 28G MISC: 25 days supply | Qty: 100 | Fill #0

## 2018-01-08 MED FILL — TRUE METRIX TEST STRIP: 25 days supply | Qty: 100 | Fill #0

## 2018-01-08 MED FILL — ?LORATADINE 10 MG TABS: 10 | 30 days supply | Qty: 30 | Fill #0

## 2018-01-09 MED FILL — LEVOTHYROXINE 100 MCG TAB: 100 | 30 days supply | Qty: 30 | Fill #0

## 2018-01-12 ENCOUNTER — Telehealth: Payer: Self-pay

## 2018-01-12 NOTE — Telephone Encounter (Signed)
-----   Message from Claiborne RiggZelda W Fleming, NP sent at 01/08/2018  8:51 PM EST ----- Thyroid level is high. It does not appear you are taking your medication for your thyroid as prescribed. This can cause very serious side effects. Will need to increase your thyroid medication. Please make a lab appointment to have your thyroid level rechecked in 4-6 weeks.

## 2018-01-12 NOTE — Telephone Encounter (Signed)
CMA attempt to reach patient to inform on results.  No answer and left a VM for patient to call back.  If patient call back, please inform:  Thyroid level is high. It does not appear you are taking your medication for your thyroid as prescribed. This can cause very serious side effects. Will need to increase your thyroid medication. Please make a lab appointment to have your thyroid level rechecked in 4-6 weeks.

## 2018-01-15 NOTE — Telephone Encounter (Signed)
A letter will be send out to reach patient  

## 2018-01-25 MED FILL — ?METFORMIN HCL 1,000 MG TAB: 1000 | 30 days supply | Qty: 60 | Fill #1

## 2018-02-06 MED FILL — LISINOPRIL 10 MG TABS: 10 | 30 days supply | Qty: 30 | Fill #4

## 2018-02-06 MED FILL — ?ATORVASTATIN 10 MG TABLET: 10 | 30 days supply | Qty: 30 | Fill #4

## 2018-02-16 ENCOUNTER — Other Ambulatory Visit: Payer: No Typology Code available for payment source

## 2018-02-16 MED FILL — LEVOTHYROXINE 100 MCG TAB: 100 | 30 days supply | Qty: 30 | Fill #1

## 2018-02-16 MED FILL — ?GLIPIZDE ER 5 MG TB24: 5 | 30 days supply | Qty: 30 | Fill #1

## 2018-02-23 ENCOUNTER — Telehealth: Payer: Self-pay | Admitting: Nurse Practitioner

## 2018-02-23 NOTE — Telephone Encounter (Signed)
1) Medication(s) Requested (by name): lisinopril 2) Pharmacy of Choice:  [facility]

## 2018-02-24 NOTE — Telephone Encounter (Signed)
According to the pharmacy software the patient should have 10 days worth of medication left at home. If there is an issue she should speak with the pharmacy directly.

## 2018-02-26 NOTE — Telephone Encounter (Signed)
Called patient no answer LVM to call back. °

## 2018-02-27 MED FILL — ?METFORMIN HCL 1000MG TABS: 1000 | 30 days supply | Qty: 60 | Fill #2

## 2018-03-05 MED FILL — LISINOPRIL 10 MG TABS: 10 | 30 days supply | Qty: 30 | Fill #5

## 2018-03-12 ENCOUNTER — Other Ambulatory Visit: Payer: No Typology Code available for payment source

## 2018-03-13 ENCOUNTER — Ambulatory Visit: Payer: Self-pay | Attending: Nurse Practitioner

## 2018-03-13 ENCOUNTER — Ambulatory Visit: Payer: No Typology Code available for payment source

## 2018-03-13 DIAGNOSIS — E039 Hypothyroidism, unspecified: Secondary | ICD-10-CM | POA: Insufficient documentation

## 2018-03-14 ENCOUNTER — Other Ambulatory Visit: Payer: Self-pay | Admitting: Nurse Practitioner

## 2018-03-14 DIAGNOSIS — E039 Hypothyroidism, unspecified: Secondary | ICD-10-CM

## 2018-03-14 LAB — TSH: TSH: 0.925 u[IU]/mL (ref 0.450–4.500)

## 2018-03-14 MED ORDER — LEVOTHYROXINE SODIUM 100 MCG PO TABS: 100.0000 ug | ORAL_TABLET | Freq: Every day | ORAL | 2 refills | Status: DC

## 2018-03-14 MED FILL — LEVOTHYROXINE 100 MCG TAB: 100 | 30 days supply | Qty: 30 | Fill #0

## 2018-03-16 ENCOUNTER — Telehealth: Payer: Self-pay | Admitting: Nurse Practitioner

## 2018-03-16 ENCOUNTER — Telehealth (INDEPENDENT_AMBULATORY_CARE_PROVIDER_SITE_OTHER): Payer: Self-pay

## 2018-03-16 NOTE — Telephone Encounter (Signed)
1) Medication(s) Requested (by name): °lisinopril °2) Pharmacy of Choice: ° °[facility] °

## 2018-03-16 NOTE — Telephone Encounter (Signed)
This patient picked up a prescription for lisinopril from the pharmacy on 03/05/18, she should have 19 days of medication left at home. She also has an RX for levothyroxine ready for pickup in the pharmacy.

## 2018-03-16 NOTE — Telephone Encounter (Signed)
Tried to contact patient. No answer.

## 2018-03-16 NOTE — Telephone Encounter (Signed)
Patient called to check on the status of their results. Please follow up/ °

## 2018-03-16 NOTE — Telephone Encounter (Signed)
-----   Message from Claiborne RiggZelda W Fleming, NP sent at 03/14/2018  8:57 AM EST ----- Thyroid level is normal.  Continue on 100 mcg of Synthroid daily.  Will send prescription to pharmacy.

## 2018-03-16 NOTE — Telephone Encounter (Signed)
Call placed using pacific interpreter [phone]) left voicemail notifying patient that thyroid levels are normal. Continue taking 100 mcg of synthroid daily. Prescription has been sent to pharmacy. Call [facility] at [phone] with any questions or concerns. Maryjean Morn, CMA

## 2018-03-19 MED FILL — ?ATORVASTATIN 10 MG TABLET: 10 | 30 days supply | Qty: 30 | Fill #5

## 2018-03-19 MED FILL — ?LORATADINE 10 MG TABS: 10 | 30 days supply | Qty: 30 | Fill #1

## 2018-03-22 NOTE — Telephone Encounter (Signed)
Left message on voicemail wit interpreter assistance provided by [facility], [place]- [phone]

## 2018-03-27 ENCOUNTER — Telehealth: Payer: Self-pay | Admitting: Nurse Practitioner

## 2018-03-27 MED FILL — metFORMIN HCL 1000 MG TABS: 1000 | 30 days supply | Qty: 60 | Fill #3

## 2018-03-27 MED FILL — ?GLIPIZDE ER 5 MG TB24: 5 | 30 days supply | Qty: 30 | Fill #2

## 2018-03-27 NOTE — Telephone Encounter (Signed)
CMA attempt to reach patient to inform on lab results.  No answer and left a VM for patient to callback.

## 2018-03-27 NOTE — Telephone Encounter (Signed)
Patient called back to get her results. Please follow up.

## 2018-03-28 NOTE — Telephone Encounter (Signed)
Patient called to get their lab results. Please follow up.  °

## 2018-03-29 ENCOUNTER — Ambulatory Visit: Payer: No Typology Code available for payment source | Attending: Family Medicine

## 2018-03-29 MED FILL — LISINOPRIL 10 MG TABS: 10 | 30 days supply | Qty: 30 | Fill #6

## 2018-03-29 NOTE — Telephone Encounter (Signed)
CMA spoke to patient to inform on results. Patient understood. Patient verified DOB. [employer] Interpreter assist with the call [phone] [place]

## 2018-04-10 ENCOUNTER — Ambulatory Visit: Payer: No Typology Code available for payment source | Admitting: Nurse Practitioner

## 2018-04-18 MED FILL — LEVOTHYROXINE 100 MCG TAB: 100 | 30 days supply | Qty: 30 | Fill #1

## 2018-04-18 MED FILL — TRUE METRIX TEST STRIP: 25 days supply | Qty: 100 | Fill #1

## 2018-04-18 MED FILL — ?LORATADINE 10 MG TABS: 10 | 30 days supply | Qty: 30 | Fill #2

## 2018-04-18 MED FILL — TRUEplus LANCETS 28G MISC: 25 days supply | Qty: 100 | Fill #1

## 2018-04-18 MED FILL — ?ATORVASTATIN 10 MG TABLET: 10 | 30 days supply | Qty: 30 | Fill #6

## 2018-04-26 MED FILL — LISINOPRIL 10 MG TABS: 10 | 30 days supply | Qty: 30 | Fill #7

## 2018-04-26 MED FILL — ?METFORMIN HCL 1000 MG TAB: 1000 | 30 days supply | Qty: 60 | Fill #4

## 2018-04-26 MED FILL — glipiZIDE XL 5 MG TB24: 5 | 30 days supply | Qty: 30 | Fill #3

## 2018-05-10 ENCOUNTER — Telehealth: Payer: Self-pay | Admitting: Nurse Practitioner

## 2018-05-10 NOTE — Telephone Encounter (Signed)
1) Medication(s) Requested (by name): fluticasone (FLONASE) 50 MCG/ACT nasal spray   2) Pharmacy of Choice: [facility]  3) Special Requests: Pt states she is having runny nose   Approved medications will be sent to the pharmacy, we will reach out if there is an issue.  Requests made after 3pm may not be addressed until the following business day!  If a patient is unsure of the name of the medication(s) please note and ask patient to call back when they are able to provide all info, do not send to responsible party until all information is available!

## 2018-05-11 NOTE — Telephone Encounter (Signed)
Medication was discontinued in 2019 due to 'patient not taking' please authorize new RX if appropriate.

## 2018-05-14 ENCOUNTER — Other Ambulatory Visit: Payer: Self-pay | Admitting: Nurse Practitioner

## 2018-05-14 MED ORDER — FLUTICASONE PROPIONATE 50 MCG/ACT NA SUSP: 2.0000 | Freq: Every day | NASAL | 6 refills | Status: DC

## 2018-05-16 ENCOUNTER — Telehealth: Payer: Self-pay | Admitting: Nurse Practitioner

## 2018-05-16 NOTE — Telephone Encounter (Signed)
1) Medication(s) Requested (by name): levothyroxine (SYNTHROID, LEVOTHROID) 100 MCG tablet   2) Pharmacy of Choice: [facility] [place], [place] - [address]  3) Special Requests: Pt states her address is current for med delivery  [id number] [place] [address]  Approved medications will be sent to the pharmacy, we will reach out if there is an issue.  Requests made after 3pm may not be addressed until the following business day!  If a patient is unsure of the name of the medication(s) please note and ask patient to call back when they are able to provide all info, do not send to responsible party until all information is available!

## 2018-05-17 NOTE — Telephone Encounter (Signed)
Refill submitted, address confirmed with pharmacy.

## 2018-05-18 ENCOUNTER — Ambulatory Visit: Payer: Self-pay | Admitting: Nurse Practitioner

## 2018-05-21 ENCOUNTER — Encounter: Payer: Self-pay | Admitting: Nurse Practitioner

## 2018-05-21 ENCOUNTER — Other Ambulatory Visit: Payer: Self-pay

## 2018-05-21 ENCOUNTER — Ambulatory Visit: Payer: Self-pay | Attending: Nurse Practitioner | Admitting: Nurse Practitioner

## 2018-05-21 DIAGNOSIS — E1165 Type 2 diabetes mellitus with hyperglycemia: Secondary | ICD-10-CM

## 2018-05-21 DIAGNOSIS — E039 Hypothyroidism, unspecified: Secondary | ICD-10-CM

## 2018-05-21 DIAGNOSIS — E782 Mixed hyperlipidemia: Secondary | ICD-10-CM

## 2018-05-21 DIAGNOSIS — E781 Pure hyperglyceridemia: Secondary | ICD-10-CM

## 2018-05-21 DIAGNOSIS — J309 Allergic rhinitis, unspecified: Secondary | ICD-10-CM

## 2018-05-21 DIAGNOSIS — I10 Essential (primary) hypertension: Secondary | ICD-10-CM

## 2018-05-21 MED ORDER — LEVOTHYROXINE SODIUM 100 MCG PO TABS: 100.0000 ug | ORAL_TABLET | Freq: Every day | ORAL | 2 refills | Status: DC

## 2018-05-21 MED ORDER — LISINOPRIL 10 MG PO TABS: 10.0000 mg | ORAL_TABLET | Freq: Every day | ORAL | 2 refills | Status: DC

## 2018-05-21 MED ORDER — LORATADINE 10 MG PO TABS: 10.0000 mg | ORAL_TABLET | Freq: Every day | ORAL | 11 refills | Status: DC

## 2018-05-21 MED ORDER — ATORVASTATIN CALCIUM 20 MG PO TABS: 20.0000 mg | ORAL_TABLET | Freq: Every day | ORAL | 1 refills | Status: DC

## 2018-05-21 MED ORDER — GLIPIZIDE ER 5 MG PO TB24: 5.0000 mg | ORAL_TABLET | Freq: Every day | ORAL | 1 refills | Status: DC

## 2018-05-21 MED ORDER — METFORMIN HCL 1000 MG PO TABS: 1000.0000 mg | ORAL_TABLET | Freq: Two times a day (BID) | ORAL | 5 refills | Status: DC

## 2018-05-21 MED ORDER — OMEGA-3-ACID ETHYL ESTERS 1 G PO CAPS: 1.0000 g | ORAL_CAPSULE | Freq: Two times a day (BID) | ORAL | 3 refills | Status: DC

## 2018-05-21 NOTE — Progress Notes (Signed)
Assessment & Plan:  Erin Huff was seen today for medication refill.  Diagnoses and all orders for this visit:  Acquired hypothyroidism -     Discontinue: levothyroxine (SYNTHROID, LEVOTHROID) 100 MCG tablet; Take 1 tablet (100 mcg total) by mouth daily before breakfast for 30 days. -     levothyroxine (SYNTHROID, LEVOTHROID) 100 MCG tablet; Take 1 tablet (100 mcg total) by mouth daily before breakfast for 30 days. -     TSH; Future  Uncontrolled type 2 diabetes mellitus with hyperglycemia, without long-term current use of insulin (HCC) -     glipiZIDE (GLIPIZIDE XL) 5 MG 24 hr tablet; Take 1 tablet (5 mg total) by mouth daily with breakfast. -     metFORMIN (GLUCOPHAGE) 1000 MG tablet; Take 1 tablet (1,000 mg total) by mouth 2 (two) times daily with a meal. Controlled Continue medications as prescribed.  Continue blood sugar control as discussed in office today, low carbohydrate diet, and regular physical exercise as tolerated, 150 minutes per week (30 min each day, 5 days per week, or 50 min 3 days per week). Keep blood sugar logs with fasting goal of 90-130 mg/dl, post prandial (after you eat) less than 180.  For Hypoglycemia: BS <60 and Hyperglycemia BS >400; contact the clinic ASAP. Annual eye exams and foot exams are recommended.   Essential hypertension -     lisinopril (PRINIVIL,ZESTRIL) 10 MG tablet; Take 1 tablet (10 mg total) by mouth daily. Continue all antihypertensives as prescribed.  Remember to bring in your blood pressure log with you for your follow up appointment.  DASH/Mediterranean Diets are healthier choices for HTN.    Allergic rhinitis, unspecified seasonality, unspecified trigger -     loratadine (CLARITIN) 10 MG tablet; Take 1 tablet (10 mg total) by mouth daily.  Hypertriglyceridemia -     omega-3 acid ethyl esters (LOVAZA) 1 g capsule; Take 1 capsule (1 g total) by mouth 2 (two) times daily. INSTRUCTIONS: Work on a low fat, heart healthy diet and  participate in regular aerobic exercise program by working out at least 150 minutes per week; 5 days a week-30 minutes per day. Avoid red meat, fried foods. junk foods, sodas, sugary drinks, unhealthy snacking, alcohol and smoking.  Drink at least 48oz of water per day and monitor your carbohydrate intake daily.   Mixed hyperlipidemia -     atorvastatin (LIPITOR) 20 MG tablet; Take 1 tablet (20 mg total) by mouth daily at 6 PM. INSTRUCTIONS: Work on a low fat, heart healthy diet and participate in regular aerobic exercise program by working out at least 150 minutes per week; 5 days a week-30 minutes per day. Avoid red meat, fried foods. junk foods, sodas, sugary drinks, unhealthy snacking, alcohol and smoking.  Drink at least 48oz of water per day and monitor your carbohydrate intake daily.     Patient has been counseled on age-appropriate routine health concerns for screening and prevention. These are reviewed and up-to-date. Referrals have been placed accordingly. Immunizations are up-to-date or declined.    Subjective:   Chief Complaint  Patient presents with  . Medication Refill    Pt. is requesting for medication refill.    HPI Erin Huff 63 y.o. female is requesting medication refills today. Telephonic interpreter was used for the tele health call today.   Hypothyroidism Taking levothyroxine 100 mcg as prescribed however she recently (3 days ago) ran out. Will refill today. Last TSH level was normal. She currently denies fatigue, weight changes, heat/cold intolerance, bowel/skin  changes or CVS symptoms. Lab Results  Component Value Date   TSH 0.925 03/13/2018   Mixed Hyperlipidemia Will increase atorvastatin from '10mg'$  to '20mg'$  due to increased triglyceride level. She continues to take omega 3 one gram BID as prescribed. Denies any statin intolerance or myalgia.  Lab Results  Component Value Date   CHOL 165 01/05/2018   CHOL 165 10/03/2017   CHOL 120 05/26/2017    Lab Results  Component Value Date   HDL 53 01/05/2018   HDL 54 10/03/2017   HDL 43 05/26/2017   Lab Results  Component Value Date   LDLCALC 40 01/05/2018   LDLCALC 73 10/03/2017   LDLCALC 40 05/26/2017   Lab Results  Component Value Date   TRIG 360 (H) 01/05/2018   TRIG 190 (H) 10/03/2017   TRIG 187 (H) 05/26/2017   Lab Results  Component Value Date   CHOLHDL 3.1 01/05/2018   CHOLHDL 3.1 10/03/2017   CHOLHDL 2.8 05/26/2017   DM TYPE 2 Well controlled. Taking glipizide 5 mg daily, metformin 1000 mg BID as prescribed and lisinopril '10mg'$  per ADA guidelines and Essential HTN. She currently denies any hypo or hyperglycemic symptoms. Does not monitor her blood glucose levels routinely.  Lab Results  Component Value Date   HGBA1C 6.9 (A) 10/03/2017   CHRONIC HYPERTENSION Disease Monitoring: she does not monitor her blood pressure at home.   Blood pressure range BP Readings from Last 3 Encounters:  01/05/18 (!) 149/87  10/03/17 132/82  09/27/17 132/84    Chest pain: no   Dyspnea: no   Claudication: no  Medication compliance: yes  Medication Side Effects  Lightheadedness: no   Urinary frequency: no   Edema: no   Impotence: no  Preventitive Healthcare:  Exercise: no   Diet Pattern: diet: general  Salt Restriction:  no  Review of Systems  Constitutional: Negative for fever, malaise/fatigue and weight loss.  HENT: Negative.  Negative for nosebleeds.   Eyes: Negative.  Negative for blurred vision, double vision and photophobia.  Respiratory: Negative.  Negative for cough and shortness of breath.   Cardiovascular: Negative.  Negative for chest pain, palpitations and leg swelling.  Gastrointestinal: Negative.  Negative for heartburn, nausea and vomiting.  Musculoskeletal: Negative.  Negative for myalgias.  Neurological: Negative.  Negative for dizziness, focal weakness, seizures and headaches.  Psychiatric/Behavioral: Negative.  Negative for suicidal ideas.    Past  Medical History:  Diagnosis Date  . Allergy   . Depression    mild  . Diabetes mellitus without complication ([place])   . GERD (gastroesophageal reflux disease)   . Heart murmur   . Hyperlipidemia   . Hypertension   . Thyroid disease     Past Surgical History:  Procedure Laterality Date  . NO PAST SURGERIES      Family History  Problem Relation Age of Onset  . Hypertension Sister   . Diabetes Brother   . Breast cancer Maternal Aunt   . Colon cancer Neg Hx   . Colon polyps Neg Hx   . Esophageal cancer Neg Hx   . Rectal cancer Neg Hx   . Stomach cancer Neg Hx     Social History Reviewed with no changes to be made today.   Outpatient Medications Prior to Visit  Medication Sig Dispense Refill  . Blood Glucose Monitoring Suppl (TRUE METRIX METER) w/Device KIT Use as directed 1 kit 0  . fluticasone (FLONASE) 50 MCG/ACT nasal spray Place 2 sprays into both nostrils daily. 16 g  6  . glucose blood (TRUE METRIX BLOOD GLUCOSE TEST) test strip Use as instructed 100 each 12  . TRUEPLUS LANCETS 28G MISC Use as directed 100 each 5  . atorvastatin (LIPITOR) 10 MG tablet Take 1 tablet (10 mg total) by mouth daily at 6 PM. 90 tablet 5  . glipiZIDE (GLIPIZIDE XL) 5 MG 24 hr tablet Take 1 tablet (5 mg total) by mouth daily with breakfast. 90 tablet 1  . lisinopril (PRINIVIL,ZESTRIL) 10 MG tablet Take 1 tablet (10 mg total) by mouth daily. 90 tablet 2  . loratadine (CLARITIN) 10 MG tablet Take 1 tablet (10 mg total) by mouth daily. 30 tablet 11  . metFORMIN (GLUCOPHAGE) 1000 MG tablet Take 1 tablet (1,000 mg total) by mouth 2 (two) times daily with a meal. 60 tablet 5  . Multiple Vitamin (MULTIVITAMIN) tablet Take 1 tablet by mouth daily.    . Olopatadine HCl 0.2 % SOLN Apply 1-2 drops per eye daily. (Patient not taking: Reported on 10/03/2017) 2.5 mL 1  . OVER THE COUNTER MEDICATION nutrilite carb blocker as needed- helps with constipation    . levothyroxine (SYNTHROID, LEVOTHROID) 100 MCG  tablet Take 1 tablet (100 mcg total) by mouth daily before breakfast for 30 days. 30 tablet 2  . omega-3 acid ethyl esters (LOVAZA) 1 g capsule Take 1 capsule (1 g total) by mouth 2 (two) times daily. (Patient not taking: Reported on 01/05/2018) 60 capsule 3   Facility-Administered Medications Prior to Visit  Medication Dose Route Frequency Provider Last Rate Last Dose  . 0.9 %  sodium chloride infusion  500 mL Intravenous Continuous Pyrtle, Lajuan Lines, MD        Allergies  Allergen Reactions  . Motrin [Ibuprofen] Rash  . Penicillins Rash       Objective:    There were no vitals taken for this visit. Wt Readings from Last 3 Encounters:  01/05/18 174 lb (78.9 kg)  10/03/17 168 lb 12.8 oz (76.6 kg)  09/27/17 168 lb (76.2 kg)        Patient has been counseled extensively about nutrition and exercise as well as the importance of adherence with medications and regular follow-up. The patient was given clear instructions to go to ER or return to medical center if symptoms don't improve, worsen or new problems develop. The patient verbalized understanding.   Follow-up: Return in 25 days (on 06/15/2018).   Gildardo Pounds, FNP-BC [facility], [place]   05/21/2018, 12:35 PM

## 2018-06-15 ENCOUNTER — Other Ambulatory Visit: Payer: Self-pay

## 2018-06-15 ENCOUNTER — Ambulatory Visit: Payer: Self-pay | Attending: Nurse Practitioner

## 2018-06-15 DIAGNOSIS — E039 Hypothyroidism, unspecified: Secondary | ICD-10-CM

## 2018-06-15 MED FILL — TRUEplus LANCETS 28G MISC: 25 days supply | Qty: 100 | Fill #2

## 2018-06-15 MED FILL — TRUE METRIX TEST STRIP: 25 days supply | Qty: 100 | Fill #2

## 2018-06-15 MED FILL — ?METFORMIN HCL 1000 MG TAB: 1000 | 30 days supply | Qty: 60 | Fill #0

## 2018-06-15 MED FILL — ?LORATADINE 10 MG TABS: 10 | 30 days supply | Qty: 30 | Fill #3

## 2018-06-16 LAB — TSH: TSH: 1.25 u[IU]/mL (ref 0.450–4.500)

## 2018-06-17 ENCOUNTER — Other Ambulatory Visit: Payer: Self-pay | Admitting: Nurse Practitioner

## 2018-06-17 DIAGNOSIS — E039 Hypothyroidism, unspecified: Secondary | ICD-10-CM

## 2018-06-17 MED ORDER — LEVOTHYROXINE SODIUM 100 MCG PO TABS: 100.0000 ug | ORAL_TABLET | Freq: Every day | ORAL | 2 refills | Status: DC

## 2018-06-18 MED FILL — LEVOTHYROXINE 100 MCG TAB: 100 | 30 days supply | Qty: 30 | Fill #0

## 2018-06-19 ENCOUNTER — Telehealth: Payer: Self-pay

## 2018-06-19 NOTE — Telephone Encounter (Signed)
-----   Message from Claiborne Rigg, NP sent at 06/17/2018  1:21 PM EDT ----- TSH is normal. Thyroid medication has been filled.

## 2018-06-19 NOTE — Telephone Encounter (Signed)
CMA spoke to patient to inform on lab results.  Pt. Verified DOB. Pt. Understood and aware of Rx was sent to the pharmacy.   Spanish interpreter assist with the call.

## 2018-06-20 ENCOUNTER — Ambulatory Visit: Payer: Self-pay | Attending: Family Medicine | Admitting: Physician Assistant

## 2018-06-20 ENCOUNTER — Other Ambulatory Visit: Payer: Self-pay

## 2018-06-20 DIAGNOSIS — R140 Abdominal distension (gaseous): Secondary | ICD-10-CM

## 2018-06-20 DIAGNOSIS — E1165 Type 2 diabetes mellitus with hyperglycemia: Secondary | ICD-10-CM

## 2018-06-20 DIAGNOSIS — Z789 Other specified health status: Secondary | ICD-10-CM

## 2018-06-20 DIAGNOSIS — R21 Rash and other nonspecific skin eruption: Secondary | ICD-10-CM

## 2018-06-20 DIAGNOSIS — I10 Essential (primary) hypertension: Secondary | ICD-10-CM

## 2018-06-20 MED ORDER — TRIAMCINOLONE ACETONIDE 0.1 % EX CREA: 1.0000 "application " | TOPICAL_CREAM | Freq: Two times a day (BID) | CUTANEOUS | 0 refills | Status: DC

## 2018-06-20 MED ORDER — OMEPRAZOLE 20 MG PO CPDR: 20.0000 mg | DELAYED_RELEASE_CAPSULE | Freq: Every day | ORAL | 0 refills | Status: DC

## 2018-06-20 NOTE — Progress Notes (Signed)
Swelling in abdomen LBM- today  Needs medication for allergies or rash  discomfort in the back of my neck Had massage and made feel better

## 2018-06-20 NOTE — Progress Notes (Signed)
Patient ID: Erin Huff, female   DOB: [birth year], 63 y.o.   MRN: [id number]  Virtual Visit via Telephone Note  I connected with Erin Huff on 06/20/18 at 10:30 AM EDT by telephone and verified that I am speaking with the correct person using two identifiers.   I discussed the limitations, risks, security and privacy concerns of performing an evaluation and management service by telephone and the availability of in person appointments. I also discussed with the patient that there may be a patient responsible charge related to this service. The patient expressed understanding and agreed to proceed.  Patient location:  home My Location:  [facility] office Persons on the call:  Myself, the patient, translator   History of Present Illness: [employer] [place] translating. Abdominal bloating for about 3 days, gas, burping.  No fever.  BM normal.  No vomiting, no diarrhea.  No nausea.  No dysuria. Not painful-just uncomfortable.  No OTC.  Appetite is normal.    Blood sugars running 150-160s  Also has rash on thigh-has used triamcinilone in the past which helped.  Rash itches slightly-comes and goes    Observations/Objective: A&Ox3.  TP linear.  Speech is normal.    Assessment and Plan: 1. Abdominal bloating Obtain gas-x.  To ED if worsens/localizes/fever/vomiting - omeprazole (PRILOSEC) 20 MG capsule; Take 1 capsule (20 mg total) by mouth daily.  Dispense: 30 capsule; Refill: 0  2. Uncontrolled type 2 diabetes mellitus with hyperglycemia, without long-term current use of insulin (HCC) Uncontrolled/not at [facility] on diet and continue current regimen  3. Language barrier pacific interpreters used and additional time performing visit was required.  4. Essential hypertension Check BP OOO and record  5. Rash - triamcinolone cream (KENALOG) 0.1 %; Apply 1 application topically 2 (two) times daily.  Dispense: 30 g; Refill: 0   Follow Up Instructions: 3   Months with PCP.   I discussed the assessment and treatment plan with the patient. The patient was provided an opportunity to ask questions and all were answered. The patient agreed with the plan and demonstrated an understanding of the instructions.   The patient was advised to call back or seek an in-person evaluation if the symptoms worsen or if the condition fails to improve as anticipated.  I provided 14 minutes of non-face-to-face time during this encounter.   Georgian Co, PA-C

## 2018-07-02 MED FILL — ?OMEPRAZOLE 20 MG CAPSULE D: 20 | 30 days supply | Qty: 30 | Fill #0

## 2018-07-02 MED FILL — TRIAMCINOLONE ACETONIDE 0.1: 0.1 | 15 days supply | Qty: 30 | Fill #0

## 2018-07-19 MED FILL — ?LORATADINE 10 MG TABS: 10 | 90 days supply | Qty: 90 | Fill #4

## 2018-07-19 MED FILL — LEVOTHYROXINE 100 MCG TAB: 100 | 30 days supply | Qty: 30 | Fill #1

## 2018-07-19 MED FILL — ?METFORMIN HCL 1000 MG TAB: 1000 | 30 days supply | Qty: 60 | Fill #1

## 2018-07-20 MED FILL — LISINOPRIL 10 MG TABS: 10 | 30 days supply | Qty: 30 | Fill #0

## 2018-07-20 MED FILL — glipiZIDE XL 5 MG TB24: 5 | 30 days supply | Qty: 30 | Fill #0

## 2018-08-07 ENCOUNTER — Ambulatory Visit: Payer: Self-pay | Admitting: Primary Care

## 2018-08-09 ENCOUNTER — Ambulatory Visit: Payer: Self-pay | Admitting: Primary Care

## 2018-08-10 ENCOUNTER — Ambulatory Visit: Payer: Self-pay | Attending: Internal Medicine | Admitting: Nurse Practitioner

## 2018-08-10 ENCOUNTER — Other Ambulatory Visit: Payer: Self-pay

## 2018-08-10 ENCOUNTER — Encounter: Payer: Self-pay | Admitting: Nurse Practitioner

## 2018-08-10 DIAGNOSIS — I10 Essential (primary) hypertension: Secondary | ICD-10-CM

## 2018-08-10 DIAGNOSIS — E039 Hypothyroidism, unspecified: Secondary | ICD-10-CM

## 2018-08-10 DIAGNOSIS — E782 Mixed hyperlipidemia: Secondary | ICD-10-CM

## 2018-08-10 DIAGNOSIS — E1165 Type 2 diabetes mellitus with hyperglycemia: Secondary | ICD-10-CM

## 2018-08-10 MED ORDER — LEVOTHYROXINE SODIUM 100 MCG PO TABS: 100.0000 ug | ORAL_TABLET | Freq: Every day | ORAL | 2 refills | Status: DC

## 2018-08-10 MED ORDER — TRUE METRIX BLOOD GLUCOSE TEST VI STRP: ORAL_STRIP | 12 refills | Status: DC

## 2018-08-10 MED ORDER — GLIPIZIDE ER 5 MG PO TB24: 5.0000 mg | ORAL_TABLET | Freq: Every day | ORAL | 1 refills | Status: DC

## 2018-08-10 MED ORDER — METFORMIN HCL 1000 MG PO TABS: 1000.0000 mg | ORAL_TABLET | Freq: Two times a day (BID) | ORAL | 5 refills | Status: DC

## 2018-08-10 MED ORDER — ATORVASTATIN CALCIUM 20 MG PO TABS: 20.0000 mg | ORAL_TABLET | Freq: Every day | ORAL | 1 refills | Status: DC

## 2018-08-10 MED FILL — LEVOTHYROXINE 100 MCG TAB: 100 | 30 days supply | Qty: 30 | Fill #0

## 2018-08-10 MED FILL — TRUE METRIX TEST STRIP: 30 days supply | Qty: 100 | Fill #0

## 2018-08-10 MED FILL — ?ATORVASTATIN 20 MG TABLET: 20 | 30 days supply | Qty: 30 | Fill #0

## 2018-08-10 MED FILL — glipiZIDE XL 5 MG TB24: 5 | 30 days supply | Qty: 30 | Fill #0

## 2018-08-10 MED FILL — metFORMIN HCL 1000 MG TABS: 1000 | 30 days supply | Qty: 60 | Fill #0

## 2018-08-10 NOTE — Progress Notes (Signed)
Virtual Visit via Telephone Note Due to national recommendations of social distancing due to COVID 19, telehealth visit is felt to be most appropriate for this patient at this time.  I discussed the limitations, risks, security and privacy concerns of performing an evaluation and management service by telephone and the availability of in person appointments. I also discussed with the patient that there may be a patient responsible charge related to this service. The patient expressed understanding and agreed to proceed.    I connected with Erin Huff on 08/10/18  at   9:30 AM EDT  EDT by telephone and verified that I am speaking with the correct person using two identifiers.   Consent I discussed the limitations, risks, security and privacy concerns of performing an evaluation and management service by telephone and the availability of in person appointments. I also discussed with the patient that there may be a patient responsible charge related to this service. The patient expressed understanding and agreed to proceed.   Location of Patient: Private Residence   [facility]    Persons participating in Telemedicine visit: Erin DenverZelda Dory Verdun FNP-BC Erin Charter OakBien Huff Erin Huff  ID# Erin Huff [phone]   History of Present Illness: Telemedicine visit for: SORE THROAT and HTN  She initially made an appointment for sore throat however she states her throat feels much better and she would like to speak to me about her blood pressure readings.    Essential Hypertension She was monitoring her blood pressure at home however states her monitor is no longer working so she is unable to check her blood pressure today over the phone. States last reading 140/84 and she has recently increased her lisinopril from 10mg  to 20mg . Denies chest pain, shortness of breath, palpitations, lightheadedness, dizziness, headaches or BLE edema. Will  have her return to the office for BP check and BMP.  BP Readings from Last 3 Encounters:  01/05/18 (!) 149/87  10/03/17 132/82  09/27/17 132/84   Hypothyroidism Chronic and well controlled.  She  denies fatigue, weight changes, heat/cold intolerance, bowel/skin changes or CVS symptoms. Taking synthroid 100 mcg daily as prescribed.  Lab Results  Component Value Date   TSH 1.250 06/15/2018    Mixed Hyperlipidemia Endorses medication compliance taking atorvastatin 20 mg daily.  LDL is at goal. She denies any statin intolerance including myalgias.  Lab Results  Component Value Date   LDLCALC 40 01/05/2018     Past Medical History:  Diagnosis Date  . Allergy   . Depression    mild  . Diabetes mellitus without complication (HCC)   . GERD (gastroesophageal reflux disease)   . Heart murmur   . Hyperlipidemia   . Hypertension   . Thyroid disease     Past Surgical History:  Procedure Laterality Date  . NO PAST SURGERIES      Family History  Problem Relation Age of Onset  . Hypertension Sister   . Diabetes Brother   . Breast cancer Maternal Aunt   . Colon cancer Neg Hx   . Colon polyps Neg Hx   . Esophageal cancer Neg Hx   . Rectal cancer Neg Hx   . Stomach cancer Neg Hx     Social History   Socioeconomic History  . Marital status: Married    Spouse name: Not on file  . Number of children: Not on file  . Years of education: Not on file  . Highest education level: Not on file  Occupational History  . Not on file  Social Needs  . Financial resource strain: Not on file  . Food insecurity    Worry: Not on file    Inability: Not on file  . Transportation needs    Medical: Not on file    Non-medical: Not on file  Tobacco Use  . Smoking status: Never Smoker  . Smokeless tobacco: Never Used  Substance and Sexual Activity  . Alcohol use: No    Frequency: Never    Comment: rare   . Drug use: No  . Sexual activity: Yes    Birth control/protection: None  Lifestyle   . Physical activity    Days per week: 2 days    Minutes per session: 30 min  . Stress: Not at all  Relationships  . Social Musicianconnections    Talks on phone: Three times a week    Gets together: Once a week    Attends religious service: More than 4 times per year    Active member of club or organization: No    Attends meetings of clubs or organizations: Never    Relationship status: Married  Other Topics Concern  . Not on file  Social History Narrative   Sometimes husband is aggressive but she talks back and is aggressive with him. Does not take it.      Observations/Objective: Awake, alert and oriented x 3   Review of Systems  Constitutional: Negative for fever, malaise/fatigue and weight loss.  HENT: Negative.  Negative for nosebleeds.   Eyes: Negative.  Negative for blurred vision, double vision and photophobia.  Respiratory: Negative.  Negative for cough and shortness of breath.   Cardiovascular: Negative.  Negative for chest pain, palpitations and leg swelling.  Gastrointestinal: Negative.  Negative for heartburn, nausea and vomiting.  Musculoskeletal: Negative.  Negative for myalgias.  Neurological: Negative.  Negative for dizziness, focal weakness, seizures and headaches.  Psychiatric/Behavioral: Negative.  Negative for suicidal ideas.    Assessment and Plan: Erin Huff was evaluated today for sore throat.  Diagnoses and all orders for this visit:  Essential hypertension Continue all antihypertensives as prescribed.  Remember to bring in your blood pressure log with you for your follow up appointment.  DASH/Mediterranean Diets are healthier choices for HTN.    Acquired hypothyroidism -     levothyroxine (SYNTHROID) 100 MCG tablet; Take 1 tablet (100 mcg total) by mouth daily before breakfast for 30 days. Patient will pick up scripts Monday.  Mixed hyperlipidemia -     atorvastatin (LIPITOR) 20 MG tablet; Take 1 tablet (20 mg total) by mouth daily at 6 PM. Patient  will pick up scripts Monday. INSTRUCTIONS: Work on a low fat, heart healthy diet and participate in regular aerobic exercise program by working out at least 150 minutes per week; 5 days a week-30 minutes per day. Avoid red meat, fried foods. junk foods, sodas, sugary drinks, unhealthy snacking, alcohol and smoking.  Drink at least 48oz of water per day and monitor your carbohydrate intake daily.    Uncontrolled type 2 diabetes mellitus with hyperglycemia, without long-term current use of insulin (HCC) -     glucose blood (TRUE METRIX BLOOD GLUCOSE TEST) test strip; Use as instructed. Patient will pick up scripts Monday. -     metFORMIN (GLUCOPHAGE) 1000 MG tablet; Take 1 tablet (1,000 mg total) by mouth 2 (two) times daily with a meal. Patient will pick up scripts Monday. -     glipiZIDE (GLIPIZIDE XL) 5 MG 24  hr tablet; Take 1 tablet (5 mg total) by mouth daily with breakfast. Patient will pick up scripts Monday.     Follow Up Instructions Return in about 2 months (around 10/10/2018).     I discussed the assessment and treatment plan with the patient. The patient was provided an opportunity to ask questions and all were answered. The patient agreed with the plan and demonstrated an understanding of the instructions.   The patient was advised to call back or seek an in-person evaluation if the symptoms worsen or if the condition fails to improve as anticipated.  I provided 26 minutes of non-face-to-face time during this encounter including median intraservice time, reviewing previous notes, labs, imaging, medications and explaining diagnosis and management.  Gildardo Pounds, FNP-BC

## 2018-08-13 MED FILL — LISINOPRIL 10 MG TABS: 10 | 30 days supply | Qty: 30 | Fill #1

## 2018-08-29 ENCOUNTER — Other Ambulatory Visit: Payer: Self-pay

## 2018-08-29 DIAGNOSIS — I10 Essential (primary) hypertension: Secondary | ICD-10-CM

## 2018-08-29 MED ORDER — LISINOPRIL 20 MG PO TABS: 20.0000 mg | ORAL_TABLET | Freq: Every day | ORAL | 3 refills | Status: DC

## 2018-09-03 MED FILL — LISINOPRIL 20 MG TABLET: 20 | 30 days supply | Qty: 30 | Fill #0

## 2018-09-28 MED FILL — LEVOTHYROXINE 100 MCG TAB: 100 | 30 days supply | Qty: 30 | Fill #1

## 2018-09-28 MED FILL — glipiZIDE XL 5 MG TB24: 5 | 30 days supply | Qty: 30 | Fill #1

## 2018-09-28 MED FILL — metFORMIN HCL 1000 MG TABS: 1000 | 30 days supply | Qty: 60 | Fill #1

## 2018-09-28 MED FILL — LISINOPRIL 20 MG TABLET: 20 | 30 days supply | Qty: 30 | Fill #1

## 2018-10-09 ENCOUNTER — Encounter: Payer: Self-pay | Admitting: Nurse Practitioner

## 2018-10-09 ENCOUNTER — Ambulatory Visit: Payer: Self-pay | Attending: Nurse Practitioner | Admitting: Nurse Practitioner

## 2018-10-09 VITALS — BP 145/81 | HR 89 | Temp 99.1°F | Ht 60.0 in | Wt 163.0 lb

## 2018-10-09 DIAGNOSIS — D72829 Elevated white blood cell count, unspecified: Secondary | ICD-10-CM

## 2018-10-09 DIAGNOSIS — I10 Essential (primary) hypertension: Secondary | ICD-10-CM

## 2018-10-09 DIAGNOSIS — E1165 Type 2 diabetes mellitus with hyperglycemia: Secondary | ICD-10-CM

## 2018-10-09 DIAGNOSIS — E781 Pure hyperglyceridemia: Secondary | ICD-10-CM

## 2018-10-09 DIAGNOSIS — R140 Abdominal distension (gaseous): Secondary | ICD-10-CM

## 2018-10-09 DIAGNOSIS — R748 Abnormal levels of other serum enzymes: Secondary | ICD-10-CM

## 2018-10-09 DIAGNOSIS — E039 Hypothyroidism, unspecified: Secondary | ICD-10-CM

## 2018-10-09 LAB — GLUCOSE, POCT (MANUAL RESULT ENTRY): POC Glucose: 161 mg/dl — AB (ref 70–99)

## 2018-10-09 LAB — POCT GLYCOSYLATED HEMOGLOBIN (HGB A1C): Hemoglobin A1C: 6.9 % — AB (ref 4.0–5.6)

## 2018-10-09 MED ORDER — TRUE METRIX BLOOD GLUCOSE TEST VI STRP: ORAL_STRIP | 12 refills | Status: DC

## 2018-10-09 MED ORDER — OMEPRAZOLE 20 MG PO CPDR: 20.0000 mg | DELAYED_RELEASE_CAPSULE | Freq: Every day | ORAL | 1 refills | Status: DC

## 2018-10-09 MED ORDER — OMEGA-3-ACID ETHYL ESTERS 1 G PO CAPS: 1.0000 g | ORAL_CAPSULE | Freq: Two times a day (BID) | ORAL | 3 refills | Status: DC

## 2018-10-09 MED ORDER — FLUTICASONE PROPIONATE 50 MCG/ACT NA SUSP: 2.0000 | Freq: Every day | NASAL | 6 refills | Status: DC

## 2018-10-09 MED FILL — FLUTICASONE PROP 50 MCG SPR: 50 | 30 days supply | Qty: 16 | Fill #0

## 2018-10-09 MED FILL — TRUE METRIX TEST STRIP: 25 days supply | Qty: 100 | Fill #0

## 2018-10-09 MED FILL — ?OMEPRAZOLE 20MG CAP DR: 20 | 30 days supply | Qty: 30 | Fill #0

## 2018-10-09 MED FILL — OMEGA-3 ETHYL ESTERS 1 GM C: 1 | 30 days supply | Qty: 60 | Fill #0

## 2018-10-09 NOTE — Progress Notes (Signed)
Assessment & Plan:  Erin Huff was seen today for follow-up.  Diagnoses and all orders for this visit:  Uncontrolled type 2 diabetes mellitus with hyperglycemia, without long-term current use of insulin (HCC) -     Glucose (CBG) -     HgB A1c -     Cancel: Basic metabolic panel -     Cancel: CBC -     Lipid panel -     Discontinue: glucose blood (TRUE METRIX BLOOD GLUCOSE TEST) test strip; Use as instructed. Patient will pick up scripts Monday. -     glucose blood (TRUE METRIX BLOOD GLUCOSE TEST) test strip; Use as instructed. Controlled Continue medications as prescribed.  Continue blood sugar control as discussed in office today, low carbohydrate diet, and regular physical exercise as tolerated, 150 minutes per week (30 min each day, 5 days per week, or 50 min 3 days per week). Keep blood sugar logs with fasting goal of 90-130 mg/dl, post prandial (after you eat) less than 180.  For Hypoglycemia: BS <60 and Hyperglycemia BS >400; contact the clinic ASAP. Annual eye exams and foot exams are recommended.  Essential hypertension Continue all antihypertensives as prescribed.  Remember to bring in your blood pressure log with you for your follow up appointment.  DASH/Mediterranean Diets are healthier choices for HTN.     Abdominal bloating. -     omeprazole (PRILOSEC) 20 MG capsule; Take 1 capsule (20 mg total) by mouth daily. INSTRUCTIONS: Avoid GERD Triggers: acidic, spicy or fried foods, caffeine, coffee, sodas,  alcohol and chocolate.   Hypothyroidism, unspecified type -     TSH  Leukocytosis, unspecified type -     CBC with Differential  Elevated alkaline phosphatase level -     CMP14+EGFR  Hypertriglyceridemia -     omega-3 acid ethyl esters (LOVAZA) 1 g capsule; Take 1 capsule (1 g total) by mouth 2 (two) times daily. INSTRUCTIONS: Work on a low fat, heart healthy diet and participate in regular aerobic exercise program by working out at least 150 minutes per week; 5  days a week-30 minutes per day. Avoid red meat, fried foods. junk foods, sodas, sugary drinks, unhealthy snacking, alcohol and smoking.  Drink at least 48oz of water per day and monitor your carbohydrate intake daily.      Patient has been counseled on age-appropriate routine health concerns for screening and prevention. These are reviewed and up-to-date. Referrals have been placed accordingly. Immunizations are up-to-date or declined.    Subjective:   Chief Complaint  Patient presents with  . Follow-up    Pt. is here to follow up on DM and Thyroid.    HPI Erin Huff 63 y.o. female presents to office today for follow up  has a past medical history of Allergy, Depression, Diabetes mellitus without complication ([place]), GERD (gastroesophageal reflux disease), Heart murmur, Hyperlipidemia, Hypertension, and Thyroid disease.  Complaints of stomach burning however she is not taking her omeprazole 20 daily as prescribed.  We went over instructions on how to take omeprazole and will continue to monitor.   Essential Hypertension She takes her blood pressure medication with food so she did not take it today as she is fasting.  Current medications include lisinopril 20 mg daily. Denies chest pain, shortness of breath, palpitations, lightheadedness, dizziness, headaches or BLE edema.  BP Readings from Last 3 Encounters:  10/09/18 (!) 145/81  01/05/18 (!) 149/87  10/03/17 132/82    DM TYPE 2  Well controlled.  A1c 6.9.  Current  medications include metformin 1000 mg twice daily, glipizide XL 5 mg daily.  She is taking a statin and ACE.  Monitoring blood glucose levels at home twice per day with average fasting glucose: 90-110s.  Postprandial: 140-160s. Lab Results  Component Value Date   HGBA1C 6.9 (A) 10/09/2018    MIXED hyperlipidemia  LDL at goal of less than 70 . Endorses medication compliance taking atorvastatin 20 mg daily.  She is not consistently compliant with taking  omega-3.  Denies any statin intolerance or myalgias. Lab Results  Component Value Date   LDLCALC 40 01/05/2018    Hypothyroidism:  Patient presents for evaluation of thyroid function. She denies fatigue, weight changes, heat/cold intolerance, bowel/skin changes or CVS symptoms.  Taking levothyroxine 100 mcg daily as prescribed. Lab Results  Component Value Date   TSH 1.250 06/15/2018    ROS  Past Medical History:  Diagnosis Date  . Allergy   . Depression    mild  . Diabetes mellitus without complication ([place])   . GERD (gastroesophageal reflux disease)   . Heart murmur   . Hyperlipidemia   . Hypertension   . Thyroid disease     Past Surgical History:  Procedure Laterality Date  . NO PAST SURGERIES      Family History  Problem Relation Age of Onset  . Hypertension Sister   . Diabetes Brother   . Breast cancer Maternal Aunt   . Colon cancer Neg Hx   . Colon polyps Neg Hx   . Esophageal cancer Neg Hx   . Rectal cancer Neg Hx   . Stomach cancer Neg Hx     Social History Reviewed with no changes to be made today.   Outpatient Medications Prior to Visit  Medication Sig Dispense Refill  . atorvastatin (LIPITOR) 20 MG tablet Take 1 tablet (20 mg total) by mouth daily at 6 PM. Patient will pick up scripts Monday. 90 tablet 1  . Blood Glucose Monitoring Suppl (TRUE METRIX METER) w/Device KIT Use as directed 1 kit 0  . glipiZIDE (GLIPIZIDE XL) 5 MG 24 hr tablet Take 1 tablet (5 mg total) by mouth daily with breakfast. Patient will pick up scripts Monday. 90 tablet 1  . lisinopril (ZESTRIL) 20 MG tablet Take 1 tablet (20 mg total) by mouth daily. 90 tablet 3  . loratadine (CLARITIN) 10 MG tablet Take 1 tablet (10 mg total) by mouth daily. 30 tablet 11  . metFORMIN (GLUCOPHAGE) 1000 MG tablet Take 1 tablet (1,000 mg total) by mouth 2 (two) times daily with a meal. Patient will pick up scripts Monday. 60 tablet 5  . Multiple Vitamin (MULTIVITAMIN) tablet Take 1 tablet by  mouth daily.    . Olopatadine HCl 0.2 % SOLN Apply 1-2 drops per eye daily. 2.5 mL 1  . TRUEPLUS LANCETS 28G MISC Use as directed 100 each 5  . glucose blood (TRUE METRIX BLOOD GLUCOSE TEST) test strip Use as instructed. Patient will pick up scripts Monday. 100 each 12  . omeprazole (PRILOSEC) 20 MG capsule Take 1 capsule (20 mg total) by mouth daily. 30 capsule 0  . levothyroxine (SYNTHROID) 100 MCG tablet Take 1 tablet (100 mcg total) by mouth daily before breakfast for 30 days. Patient will pick up scripts Monday. 30 tablet 2  . OVER THE COUNTER MEDICATION nutrilite carb blocker as needed- helps with constipation    . fluticasone (FLONASE) 50 MCG/ACT nasal spray Place 2 sprays into both nostrils daily. (Patient not taking: Reported on 06/20/2018) 16  g 6  . omega-3 acid ethyl esters (LOVAZA) 1 g capsule Take 1 capsule (1 g total) by mouth 2 (two) times daily. (Patient not taking: Reported on 10/09/2018) 60 capsule 3  . triamcinolone cream (KENALOG) 0.1 % Apply 1 application topically 2 (two) times daily. (Patient not taking: Reported on 10/09/2018) 30 g 0   Facility-Administered Medications Prior to Visit  Medication Dose Route Frequency Provider Last Rate Last Dose  . 0.9 %  sodium chloride infusion  500 mL Intravenous Continuous Pyrtle, Lajuan Lines, MD        Allergies  Allergen Reactions  . Motrin [Ibuprofen] Rash  . Penicillins Rash       Objective:    BP (!) 145/81 (BP Location: Right Arm, Patient Position: Sitting, Cuff Size: Large)   Pulse 89   Temp 99.1 F (37.3 C) (Oral)   Ht 5' (1.524 m)   Wt 163 lb (73.9 kg)   SpO2 99%   BMI 31.83 kg/m  Wt Readings from Last 3 Encounters:  10/09/18 163 lb (73.9 kg)  01/05/18 174 lb (78.9 kg)  10/03/17 168 lb 12.8 oz (76.6 kg)    Physical Exam Vitals signs and nursing note reviewed.  Constitutional:      Appearance: She is well-developed.  HENT:     Head: Normocephalic and atraumatic.  Neck:     Musculoskeletal: Normal range of  motion.  Cardiovascular:     Rate and Rhythm: Normal rate and regular rhythm.     Heart sounds: Normal heart sounds. No murmur. No friction rub. No gallop.   Pulmonary:     Effort: Pulmonary effort is normal. No tachypnea or respiratory distress.     Breath sounds: Normal breath sounds. No decreased breath sounds, wheezing, rhonchi or rales.  Chest:     Chest wall: No tenderness.  Abdominal:     General: Bowel sounds are normal.     Palpations: Abdomen is soft.  Musculoskeletal: Normal range of motion.  Skin:    General: Skin is warm and dry.  Neurological:     Mental Status: She is alert and oriented to person, place, and time.     Coordination: Coordination normal.  Psychiatric:        Behavior: Behavior normal. Behavior is cooperative.        Thought Content: Thought content normal.        Judgment: Judgment normal.          Patient has been counseled extensively about nutrition and exercise as well as the importance of adherence with medications and regular follow-up. The patient was given clear instructions to go to ER or return to medical center if symptoms don't improve, worsen or new problems develop. The patient verbalized understanding.   Follow-up: Return for Physical ONLY no labs.   Gildardo Pounds, FNP-BC [facility] and [place], [place]   10/09/2018, 10:55 AM

## 2018-10-10 LAB — CBC WITH DIFFERENTIAL/PLATELET
Basophils Absolute: 0 10*3/uL (ref 0.0–0.2)
Basos: 0 %
EOS (ABSOLUTE): 0.2 10*3/uL (ref 0.0–0.4)
Eos: 3 %
Hematocrit: 41.2 % (ref 34.0–46.6)
Hemoglobin: 13.2 g/dL (ref 11.1–15.9)
Immature Grans (Abs): 0 10*3/uL (ref 0.0–0.1)
Immature Granulocytes: 0 %
Lymphocytes Absolute: 2.1 10*3/uL (ref 0.7–3.1)
Lymphs: 25 %
MCH: 29.2 pg (ref 26.6–33.0)
MCHC: 32 g/dL (ref 31.5–35.7)
MCV: 91 fL (ref 79–97)
Monocytes Absolute: 0.5 10*3/uL (ref 0.1–0.9)
Monocytes: 6 %
Neutrophils Absolute: 5.4 10*3/uL (ref 1.4–7.0)
Neutrophils: 66 %
Platelets: 220 10*3/uL (ref 150–450)
RBC: 4.52 x10E6/uL (ref 3.77–5.28)
RDW: 13 % (ref 11.7–15.4)
WBC: 8.2 10*3/uL (ref 3.4–10.8)

## 2018-10-10 LAB — LIPID PANEL
Chol/HDL Ratio: 2.4 ratio (ref 0.0–4.4)
Cholesterol, Total: 150 mg/dL (ref 100–199)
HDL: 62 mg/dL (ref >39)
LDL Calculated: 57 mg/dL (ref 0–99)
Triglycerides: 154 mg/dL — ABNORMAL HIGH (ref 0–149)
VLDL Cholesterol Cal: 31 mg/dL (ref 5–40)

## 2018-10-10 LAB — CMP14+EGFR
ALT: 17 IU/L (ref 0–32)
AST: 22 IU/L (ref 0–40)
Albumin/Globulin Ratio: 1.7 (ref 1.2–2.2)
Albumin: 4.9 g/dL — ABNORMAL HIGH (ref 3.8–4.8)
Alkaline Phosphatase: 106 IU/L (ref 39–117)
BUN/Creatinine Ratio: 18 (ref 12–28)
BUN: 15 mg/dL (ref 8–27)
Bilirubin Total: 0.5 mg/dL (ref 0.0–1.2)
CO2: 21 mmol/L (ref 20–29)
Calcium: 10.1 mg/dL (ref 8.7–10.3)
Chloride: 99 mmol/L (ref 96–106)
Creatinine, Ser: 0.84 mg/dL (ref 0.57–1.00)
GFR calc Af Amer: 87 mL/min/{1.73_m2} (ref >59)
GFR calc non Af Amer: 75 mL/min/{1.73_m2} (ref >59)
Globulin, Total: 2.9 g/dL (ref 1.5–4.5)
Glucose: 165 mg/dL — ABNORMAL HIGH (ref 65–99)
Potassium: 4.6 mmol/L (ref 3.5–5.2)
Sodium: 138 mmol/L (ref 134–144)
Total Protein: 7.8 g/dL (ref 6.0–8.5)

## 2018-10-10 LAB — TSH: TSH: 3.53 u[IU]/mL (ref 0.450–4.500)

## 2018-10-15 ENCOUNTER — Other Ambulatory Visit: Payer: Self-pay | Admitting: Nurse Practitioner

## 2018-10-15 DIAGNOSIS — E039 Hypothyroidism, unspecified: Secondary | ICD-10-CM

## 2018-10-15 MED ORDER — LEVOTHYROXINE SODIUM 100 MCG PO TABS: 100.0000 ug | ORAL_TABLET | Freq: Every day | ORAL | 2 refills | Status: DC

## 2018-10-16 ENCOUNTER — Telehealth: Payer: Self-pay | Admitting: Nurse Practitioner

## 2018-10-16 NOTE — Telephone Encounter (Signed)
Patient called to get her lab results. Please follow up. °

## 2018-10-17 NOTE — Telephone Encounter (Signed)
Patient called back for their results. Please follow up advised CMA will follow up with a interpreter.

## 2018-10-18 NOTE — Telephone Encounter (Signed)
CMA called patient back and inform on results.  Spanish interpreter assist with the call.

## 2018-10-30 ENCOUNTER — Telehealth: Payer: Self-pay | Admitting: Nurse Practitioner

## 2018-10-30 NOTE — Telephone Encounter (Signed)
Pt was inform that the CAFA is already expired  An she need to contact the Lab to talk about they bill

## 2018-10-30 NOTE — Telephone Encounter (Signed)
Patient wanted to check in regards to some bills. Please follow up

## 2018-10-31 MED FILL — metFORMIN HCL 1000 MG TABS: 1000 | 30 days supply | Qty: 60 | Fill #2

## 2018-10-31 MED FILL — LEVOTHYROXINE 100 MCG TAB: 100 | 30 days supply | Qty: 30 | Fill #2

## 2018-10-31 MED FILL — LISINOPRIL 20 MG TABLET: 20 | 30 days supply | Qty: 30 | Fill #2

## 2018-11-09 ENCOUNTER — Ambulatory Visit: Payer: Self-pay | Attending: Family Medicine

## 2018-11-09 ENCOUNTER — Other Ambulatory Visit: Payer: Self-pay

## 2018-11-16 ENCOUNTER — Ambulatory Visit: Payer: Self-pay | Attending: Nurse Practitioner | Admitting: Nurse Practitioner

## 2018-11-16 ENCOUNTER — Other Ambulatory Visit: Payer: Self-pay

## 2018-11-16 ENCOUNTER — Encounter: Payer: Self-pay | Admitting: Nurse Practitioner

## 2018-11-16 VITALS — BP 143/69 | HR 78 | Temp 99.3°F | Ht 60.0 in | Wt 171.2 lb

## 2018-11-16 DIAGNOSIS — D692 Other nonthrombocytopenic purpura: Secondary | ICD-10-CM

## 2018-11-16 DIAGNOSIS — E782 Mixed hyperlipidemia: Secondary | ICD-10-CM

## 2018-11-16 DIAGNOSIS — E039 Hypothyroidism, unspecified: Secondary | ICD-10-CM

## 2018-11-16 DIAGNOSIS — Z0000 Encounter for general adult medical examination without abnormal findings: Secondary | ICD-10-CM

## 2018-11-16 DIAGNOSIS — E1165 Type 2 diabetes mellitus with hyperglycemia: Secondary | ICD-10-CM

## 2018-11-16 LAB — GLUCOSE, POCT (MANUAL RESULT ENTRY)
POC Glucose: 274 mg/dl — AB (ref 70–99)
POC Glucose: 313 mg/dl — AB (ref 70–99)

## 2018-11-16 MED ORDER — ATORVASTATIN CALCIUM 20 MG PO TABS: 20.0000 mg | ORAL_TABLET | Freq: Every day | ORAL | 1 refills | Status: DC

## 2018-11-16 MED ORDER — LEVOTHYROXINE SODIUM 100 MCG PO TABS: 100.0000 ug | ORAL_TABLET | Freq: Every day | ORAL | 2 refills | Status: DC

## 2018-11-16 MED ORDER — INSULIN ASPART 100 UNIT/ML ~~LOC~~ SOLN
10.0000 [IU] | Freq: Once | SUBCUTANEOUS | Status: AC
  Administered 2018-11-16: 11:00:00 10 [IU] via SUBCUTANEOUS

## 2018-11-16 MED FILL — ATORVASTATIN 20 MG TABLET: 20 | 30 days supply | Qty: 30 | Fill #0

## 2018-11-16 NOTE — Progress Notes (Signed)
Assessment & Plan:  Erin Huff was seen today for annual exam.  Diagnoses and all orders for this visit:  Encounter for annual physical exam  Uncontrolled type 2 diabetes mellitus with hyperglycemia, without long-term current use of insulin (HCC) -     Glucose (CBG) -     insulin aspart (novoLOG) injection 10 Units -     Glucose (CBG) Continue blood sugar control as discussed in office today, low carbohydrate diet, and regular physical exercise as tolerated, 150 minutes per week (30 min each day, 5 days per week, or 50 min 3 days per week). Keep blood sugar logs with fasting goal of 90-130 mg/dl, post prandial (after you eat) less than 180.  For Hypoglycemia: BS <60 and Hyperglycemia BS >400; contact the clinic ASAP. Annual eye exams and foot exams are recommended. Lab Results  Component Value Date   HGBA1C 6.9 (A) 10/09/2018    Mixed hyperlipidemia -     atorvastatin (LIPITOR) 20 MG tablet; Take 1 tablet (20 mg total) by mouth daily at 6 PM. Patient will pick up scripts Monday. LDL AT GOAL INSTRUCTIONS: Work on a low fat, heart healthy diet and participate in regular aerobic exercise program by working out at least 150 minutes per week; 5 days a week-30 minutes per day. Avoid red meat, fried foods. junk foods, sodas, sugary drinks, unhealthy snacking, alcohol and smoking.  Drink at least 48oz of water per day and monitor your carbohydrate intake daily.  Lab Results  Component Value Date   LDLCALC 57 10/09/2018    Acquired hypothyroidism -     levothyroxine (SYNTHROID) 100 MCG tablet; Take 1 tablet (100 mcg total) by mouth daily before breakfast. Patient will pick up scripts Monday. Continue synthroid daily as prescribed.  Lab Results  Component Value Date   TSH 3.530 10/09/2018    Thrombocytopenia RESOLVED   Patient has been counseled on age-appropriate routine health concerns for screening and prevention. These are reviewed and up-to-date. Referrals have been placed  accordingly. Immunizations are up-to-date or declined.    Subjective:   Chief Complaint  Patient presents with  . Annual Exam   HPI Erin Huff 63 y.o. female presents to office today for annual physical.  has a past medical history of Allergy, Depression, Diabetes mellitus without complication ([place]), GERD (gastroesophageal reflux disease), Heart murmur, Hyperlipidemia, Hypertension, and Thyroid disease.  Review of Systems  Constitutional: Negative for fever, malaise/fatigue and weight loss.  HENT: Negative.  Negative for nosebleeds.   Eyes: Negative.  Negative for blurred vision, double vision and photophobia.  Respiratory: Negative.  Negative for cough and shortness of breath.   Cardiovascular: Negative.  Negative for chest pain, palpitations and leg swelling.  Gastrointestinal: Positive for heartburn. Negative for nausea and vomiting.  Genitourinary: Negative.   Musculoskeletal: Negative.  Negative for myalgias.  Skin: Negative.   Neurological: Negative.  Negative for dizziness, focal weakness, seizures and headaches.  Endo/Heme/Allergies: Positive for environmental allergies.  Psychiatric/Behavioral: Positive for depression (mild, stable). Negative for suicidal ideas.    Past Medical History:  Diagnosis Date  . Allergy   . Depression    mild  . Diabetes mellitus without complication ([place])   . GERD (gastroesophageal reflux disease)   . Heart murmur   . Hyperlipidemia   . Hypertension   . Thyroid disease     Past Surgical History:  Procedure Laterality Date  . NO PAST SURGERIES      Family History  Problem Relation Age of Onset  . Hypertension  Sister   . Diabetes Brother   . Breast cancer Maternal Aunt   . Colon cancer Neg Hx   . Colon polyps Neg Hx   . Esophageal cancer Neg Hx   . Rectal cancer Neg Hx   . Stomach cancer Neg Hx     Social History Reviewed with no changes to be made today.   Outpatient Medications Prior to Visit  Medication  Sig Dispense Refill  . Blood Glucose Monitoring Suppl (TRUE METRIX METER) w/Device KIT Use as directed 1 kit 0  . fluticasone (FLONASE) 50 MCG/ACT nasal spray Place 2 sprays into both nostrils daily. 16 g 6  . glipiZIDE (GLIPIZIDE XL) 5 MG 24 hr tablet Take 1 tablet (5 mg total) by mouth daily with breakfast. Patient will pick up scripts Monday. 90 tablet 1  . glucose blood (TRUE METRIX BLOOD GLUCOSE TEST) test strip Use as instructed. 100 each 12  . lisinopril (ZESTRIL) 20 MG tablet Take 1 tablet (20 mg total) by mouth daily. 90 tablet 3  . loratadine (CLARITIN) 10 MG tablet Take 1 tablet (10 mg total) by mouth daily. 30 tablet 11  . metFORMIN (GLUCOPHAGE) 1000 MG tablet Take 1 tablet (1,000 mg total) by mouth 2 (two) times daily with a meal. Patient will pick up scripts Monday. 60 tablet 5  . Multiple Vitamin (MULTIVITAMIN) tablet Take 1 tablet by mouth daily.    . Olopatadine HCl 0.2 % SOLN Apply 1-2 drops per eye daily. 2.5 mL 1  . omega-3 acid ethyl esters (LOVAZA) 1 g capsule Take 1 capsule (1 g total) by mouth 2 (two) times daily. 60 capsule 3  . omeprazole (PRILOSEC) 20 MG capsule Take 1 capsule (20 mg total) by mouth daily. 90 capsule 1  . OVER THE COUNTER MEDICATION nutrilite carb blocker as needed- helps with constipation    . TRUEPLUS LANCETS 28G MISC Use as directed 100 each 5  . atorvastatin (LIPITOR) 20 MG tablet Take 1 tablet (20 mg total) by mouth daily at 6 PM. Patient will pick up scripts Monday. 90 tablet 1  . levothyroxine (SYNTHROID) 100 MCG tablet Take 1 tablet (100 mcg total) by mouth daily before breakfast. Patient will pick up scripts Monday. 30 tablet 2   Facility-Administered Medications Prior to Visit  Medication Dose Route Frequency Provider Last Rate Last Dose  . 0.9 %  sodium chloride infusion  500 mL Intravenous Continuous Pyrtle, Lajuan Lines, MD        Allergies  Allergen Reactions  . Motrin [Ibuprofen] Rash  . Penicillins Rash       Objective:    BP (!)  143/69 (BP Location: Left Arm, Patient Position: Sitting, Cuff Size: Normal)   Pulse 78   Temp 99.3 F (37.4 C) (Oral)   Ht 5' (1.524 m)   Wt 171 lb 3.2 oz (77.7 kg)   SpO2 99%   BMI 33.44 kg/m  Wt Readings from Last 3 Encounters:  11/16/18 171 lb 3.2 oz (77.7 kg)  10/09/18 163 lb (73.9 kg)  01/05/18 174 lb (78.9 kg)    Physical Exam Constitutional:      Appearance: She is well-developed.  HENT:     Head: Normocephalic and atraumatic.     Right Ear: External ear normal.     Left Ear: External ear normal.     Nose: Nose normal.     Mouth/Throat:     Lips: Pink.     Mouth: Mucous membranes are moist.     Dentition: Abnormal  dentition.     Tongue: No lesions.     Palate: No mass.     Pharynx: No oropharyngeal exudate.     Comments: Partial upper plate Several fillings on lower teeth Eyes:     General: Lids are normal. No scleral icterus.       Right eye: No discharge.     Extraocular Movements: Extraocular movements intact.     Conjunctiva/sclera: Conjunctivae normal.     Pupils: Pupils are equal, round, and reactive to light.  Neck:     Musculoskeletal: Normal range of motion and neck supple.     Thyroid: No thyromegaly.     Trachea: No tracheal deviation.  Cardiovascular:     Rate and Rhythm: Normal rate and regular rhythm.     Pulses:          Dorsalis pedis pulses are 2+ on the right side and 2+ on the left side.       Posterior tibial pulses are 2+ on the right side and 2+ on the left side.     Heart sounds: Murmur present. No friction rub.  Pulmonary:     Effort: Pulmonary effort is normal. No accessory muscle usage or respiratory distress.     Breath sounds: Normal breath sounds. No decreased breath sounds, wheezing, rhonchi or rales.  Chest:     Chest wall: No tenderness.     Breasts: Breasts are symmetrical.        Right: No inverted nipple, mass, nipple discharge, skin change or tenderness.        Left: No inverted nipple, mass, nipple discharge, skin  change or tenderness.  Abdominal:     General: Bowel sounds are normal. There is no distension.     Palpations: Abdomen is soft. There is no mass.     Tenderness: There is no abdominal tenderness. There is no guarding or rebound.  Musculoskeletal: Normal range of motion.        General: No tenderness or deformity.  Feet:     Right foot:     Protective Sensation: 10 sites tested. 10 sites sensed.     Skin integrity: Skin integrity normal.     Left foot:     Protective Sensation: 10 sites tested. 10 sites sensed.     Skin integrity: Skin integrity normal.  Lymphadenopathy:     Cervical: No cervical adenopathy.     Upper Body:     Right upper body: No supraclavicular, axillary or pectoral adenopathy.     Left upper body: No supraclavicular, axillary or pectoral adenopathy.  Skin:    General: Skin is warm and dry.     Findings: No erythema.  Neurological:     Mental Status: She is alert and oriented to person, place, and time.     Cranial Nerves: No cranial nerve deficit.     Sensory: Sensation is intact.     Motor: Motor function is intact.     Coordination: Coordination is intact. Coordination normal.     Gait: Gait is intact.     Deep Tendon Reflexes:     Reflex Scores:      Patellar reflexes are 1+ on the right side and 1+ on the left side. Psychiatric:        Speech: Speech normal.        Behavior: Behavior normal.        Thought Content: Thought content normal.        Judgment: Judgment normal.  Patient has been counseled extensively about nutrition and exercise as well as the importance of adherence with medications and regular follow-up. The patient was given clear instructions to go to ER or return to medical center if symptoms don't improve, worsen or new problems develop. The patient verbalized understanding.   Follow-up: Return in about 3 months (around 02/15/2019) for HTN .   Gildardo Pounds, FNP-BC [facility] and [facility]  [place], [place]   11/16/2018, 1:31 PM

## 2018-11-22 ENCOUNTER — Other Ambulatory Visit: Payer: Self-pay | Admitting: Nurse Practitioner

## 2018-11-22 DIAGNOSIS — Z1231 Encounter for screening mammogram for malignant neoplasm of breast: Secondary | ICD-10-CM

## 2018-11-27 MED FILL — glipiZIDE XL 5 MG TB24: 5 | 30 days supply | Qty: 30 | Fill #2

## 2018-11-27 MED FILL — metFORMIN HCL 1000 MG TABS: 1000 | 30 days supply | Qty: 60 | Fill #3

## 2018-11-27 MED FILL — LEVOTHYROXINE 100 MCG TAB: 100 | 30 days supply | Qty: 30 | Fill #0

## 2018-11-27 MED FILL — LISINOPRIL 20 MG TABLET: 20 | 30 days supply | Qty: 30 | Fill #3

## 2018-12-11 ENCOUNTER — Telehealth: Payer: Self-pay | Admitting: Nurse Practitioner

## 2018-12-11 NOTE — Telephone Encounter (Signed)
Pt was informed that we are still waiting for the Financial dept. To review her application to please call back next week to check on that

## 2018-12-11 NOTE — Telephone Encounter (Signed)
Patient called to check on the Status of her OC/BC please follow up

## 2018-12-24 MED FILL — metFORMIN HCL 1000 MG TABS: 1000 | 30 days supply | Qty: 60 | Fill #4

## 2018-12-24 MED FILL — glipiZIDE XL 5 MG TB24: 5 | 30 days supply | Qty: 30 | Fill #3

## 2018-12-24 MED FILL — LEVOTHYROXINE 100 MCG TAB: 100 | 30 days supply | Qty: 30 | Fill #1

## 2018-12-24 MED FILL — LISINOPRIL 20 MG TABLET: 20 | 30 days supply | Qty: 30 | Fill #4

## 2019-01-11 ENCOUNTER — Ambulatory Visit
Admission: RE | Admit: 2019-01-11 | Discharge: 2019-01-11 | Disposition: A | Discharge status: Home / Self Care | Payer: No Typology Code available for payment source | Source: Ambulatory Visit | Attending: Nurse Practitioner | Admitting: Nurse Practitioner

## 2019-01-11 ENCOUNTER — Other Ambulatory Visit: Payer: Self-pay

## 2019-01-11 DIAGNOSIS — Z1231 Encounter for screening mammogram for malignant neoplasm of breast: Secondary | ICD-10-CM

## 2019-01-21 ENCOUNTER — Telehealth: Payer: Self-pay | Admitting: Nurse Practitioner

## 2019-01-21 MED FILL — LISINOPRIL 20 MG TABLET: 20 | 30 days supply | Qty: 30 | Fill #5

## 2019-01-21 MED FILL — glipiZIDE XL 5 MG TB24: 5 | 30 days supply | Qty: 30 | Fill #4

## 2019-01-21 MED FILL — OMEGA-3 ETHYL ESTERS 1 GM C: 1 | 30 days supply | Qty: 60 | Fill #1

## 2019-01-21 MED FILL — LEVOTHYROXINE 100 MCG TAB: 100 | 30 days supply | Qty: 30 | Fill #2

## 2019-01-21 MED FILL — metFORMIN HCL 1000 MG TABS: 1000 | 30 days supply | Qty: 60 | Fill #5

## 2019-01-21 NOTE — Telephone Encounter (Signed)
Patient called requesting to know the status of her CAFA letter. Patient applied back in September. Please f/u

## 2019-01-22 NOTE — Telephone Encounter (Signed)
Pt was informed that she was approve for CAFA and she can pick up a copy of the letter at the [facility]

## 2019-01-28 MED FILL — TRUE METRIX TEST STRIP: 25 days supply | Qty: 100 | Fill #1

## 2019-02-12 MED FILL — ?OMEPRAZOLE 20MG CAP DR: 20 | 30 days supply | Qty: 30 | Fill #1

## 2019-02-18 ENCOUNTER — Encounter: Payer: Self-pay | Admitting: Nurse Practitioner

## 2019-02-18 ENCOUNTER — Ambulatory Visit: Payer: Self-pay | Attending: Nurse Practitioner | Admitting: Nurse Practitioner

## 2019-02-18 ENCOUNTER — Other Ambulatory Visit: Payer: Self-pay

## 2019-02-18 DIAGNOSIS — E039 Hypothyroidism, unspecified: Secondary | ICD-10-CM

## 2019-02-18 DIAGNOSIS — E781 Pure hyperglyceridemia: Secondary | ICD-10-CM

## 2019-02-18 DIAGNOSIS — R140 Abdominal distension (gaseous): Secondary | ICD-10-CM

## 2019-02-18 DIAGNOSIS — E1165 Type 2 diabetes mellitus with hyperglycemia: Secondary | ICD-10-CM

## 2019-02-18 DIAGNOSIS — I10 Essential (primary) hypertension: Secondary | ICD-10-CM

## 2019-02-18 MED ORDER — TRUEPLUS LANCETS 28G MISC: 5 refills | Status: DC

## 2019-02-18 MED ORDER — METFORMIN HCL 1000 MG PO TABS: 1000.0000 mg | ORAL_TABLET | Freq: Two times a day (BID) | ORAL | 5 refills | Status: DC

## 2019-02-18 MED ORDER — TRUE METRIX BLOOD GLUCOSE TEST VI STRP: ORAL_STRIP | 12 refills | Status: DC

## 2019-02-18 MED ORDER — LEVOTHYROXINE SODIUM 100 MCG PO TABS: 100.0000 ug | ORAL_TABLET | Freq: Every day | ORAL | 2 refills | Status: DC

## 2019-02-18 MED ORDER — LISINOPRIL 20 MG PO TABS: 20.0000 mg | ORAL_TABLET | Freq: Every day | ORAL | 3 refills | Status: DC

## 2019-02-18 MED ORDER — GLIPIZIDE ER 5 MG PO TB24: 5.0000 mg | ORAL_TABLET | Freq: Every day | ORAL | 1 refills | Status: DC

## 2019-02-18 MED ORDER — OMEGA-3-ACID ETHYL ESTERS 1 G PO CAPS: 2.0000 g | ORAL_CAPSULE | Freq: Two times a day (BID) | ORAL | 1 refills | Status: DC

## 2019-02-18 MED ORDER — OMEPRAZOLE 20 MG PO CPDR: 20.0000 mg | DELAYED_RELEASE_CAPSULE | Freq: Every day | ORAL | 1 refills | Status: DC

## 2019-02-18 MED FILL — metFORMIN HCL 1000 MG TABS: 1000 | 30 days supply | Qty: 60 | Fill #0

## 2019-02-18 MED FILL — glipiZIDE XL 5 MG TB24: 5 | 30 days supply | Qty: 30 | Fill #5

## 2019-02-18 MED FILL — TRUE METRIX TEST STRIP: 25 days supply | Qty: 100 | Fill #2

## 2019-02-18 MED FILL — LISINOPRIL 20 MG TABLET: 20 | 30 days supply | Qty: 30 | Fill #6

## 2019-02-18 MED FILL — TRUEplus LANCETS 28G MISC: 30 days supply | Qty: 100 | Fill #0

## 2019-02-18 MED FILL — LEVOTHYROXINE 100 MCG TAB: 100 | 30 days supply | Qty: 30 | Fill #2

## 2019-02-18 MED FILL — OMEGA-3 ETHYL ESTERS 1 GM C: 1 | 30 days supply | Qty: 60 | Fill #2

## 2019-02-18 NOTE — Progress Notes (Signed)
Virtual Visit via Telephone Note Due to national recommendations of social distancing due to [place] 19, telehealth visit is felt to be most appropriate for this patient at this time.  I discussed the limitations, risks, security and privacy concerns of performing an evaluation and management service by telephone and the availability of in person appointments. I also discussed with the patient that there may be a patient responsible charge related to this service. The patient expressed understanding and agreed to proceed.    I connected with Erin Huff on 02/18/19  at  11:10 AM EST  EDT by telephone and verified that I am speaking with the correct person using two identifiers.   Consent I discussed the limitations, risks, security and privacy concerns of performing an evaluation and management service by telephone and the availability of in person appointments. I also discussed with the patient that there may be a patient responsible charge related to this service. The patient expressed understanding and agreed to proceed.   Location of Patient: Private Residence    Location of Provider: [place] and [place] participating in Telemedicine visit: [name] FNP-BC [place]  Spanish Interpreter ID: Rosemarie Ax [id number]   History of Present Illness: Telemedicine visit for: Follow UP   Hypothyroidism She denies fatigue, weight changes, heat/cold intolerance, bowel/skin changes or CVS symptoms. Taking synthroid 100 mg daily as prescribed.   Essential Hypertension Blood pressure not well controlled. She endorses medication compliance however a few of her blood pressure medications have expired based on pharmacy report. Current medications include: lisinopril 20 mg daily. Denies chest pain, shortness of breath, palpitations, lightheadedness, dizziness, headaches or BLE edema.  BP Readings from Last 3 Encounters:   11/16/18 (!) 143/69  10/09/18 (!) 145/81  01/05/18 (!) 149/87   DM TYPE 2 Well controlled. She denies any hypo or hyperglycemic symptoms. Currently taking glipizide XL 5 mg daily and metformin 1000 mg BID. She is overdue for eye exam. Taking ACE as prescribed. LDL at goal of<70. Could not tolerate Atorvastatin and states it was causing constipation. Will increase omega 3 at this time.  Lab Results  Component Value Date   HGBA1C 6.9 (A) 10/09/2018   Lab Results  Component Value Date   LDLCALC 57 10/09/2018    Past Medical History:  Diagnosis Date  . Allergy   . Depression    mild  . Diabetes mellitus without complication ([place])   . GERD (gastroesophageal reflux disease)   . Heart murmur   . Hyperlipidemia   . Hypertension   . Thyroid disease     Past Surgical History:  Procedure Laterality Date  . NO PAST SURGERIES      Family History  Problem Relation Age of Onset  . Hypertension Sister   . Diabetes Brother   . Breast cancer Maternal Aunt   . Colon cancer Neg Hx   . Colon polyps Neg Hx   . Esophageal cancer Neg Hx   . Rectal cancer Neg Hx   . Stomach cancer Neg Hx     Social History   Socioeconomic History  . Marital status: Married    Spouse name: Not on file  . Number of children: Not on file  . Years of education: Not on file  . Highest education level: Not on file  Occupational History  . Not on file  Tobacco Use  . Smoking status: Never Smoker  . Smokeless tobacco: Never Used  Substance and Sexual Activity  .  Alcohol use: No    Comment: rare   . Drug use: No  . Sexual activity: Yes    Birth control/protection: None  Other Topics Concern  . Not on file  Social History Narrative   Sometimes husband is aggressive but she talks back and is aggressive with him. Does not take it.    Social Determinants of Health   Financial Resource Strain:   . Difficulty of Paying Living Expenses: Not on file  Food Insecurity:   . Worried About Brewing technologist in the Last Year: Not on file  . Ran Out of Food in the Last Year: Not on file  Transportation Needs:   . Lack of Transportation (Medical): Not on file  . Lack of Transportation (Non-Medical): Not on file  Physical Activity:   . Days of Exercise per Week: Not on file  . Minutes of Exercise per Session: Not on file  Stress:   . Feeling of Stress : Not on file  Social Connections:   . Frequency of Communication with Friends and Family: Not on file  . Frequency of Social Gatherings with Friends and Family: Not on file  . Attends Religious Services: Not on file  . Active Member of Clubs or Organizations: Not on file  . Attends Banker Meetings: Not on file  . Marital Status: Not on file     Observations/Objective: Awake, alert and oriented x 3   Review of Systems  Constitutional: Negative for fever, malaise/fatigue and weight loss.  HENT: Negative.  Negative for nosebleeds.   Eyes: Negative.  Negative for blurred vision, double vision and photophobia.  Respiratory: Negative.  Negative for cough and shortness of breath.   Cardiovascular: Negative.  Negative for chest pain, palpitations and leg swelling.  Gastrointestinal: Negative.  Negative for heartburn, nausea and vomiting.  Musculoskeletal: Negative.  Negative for myalgias.  Neurological: Negative.  Negative for dizziness, focal weakness, seizures and headaches.  Psychiatric/Behavioral: Negative.  Negative for suicidal ideas.    Assessment and Plan: Erin Huff was seen today for follow-up.  Diagnoses and all orders for this visit:  Acquired hypothyroidism -     levothyroxine (SYNTHROID) 100 MCG tablet; Take 1 tablet (100 mcg total) by mouth daily before breakfast. Patient will pick up scripts Monday.  Essential hypertension -     lisinopril (ZESTRIL) 20 MG tablet; Take 1 tablet (20 mg total) by mouth daily.  Uncontrolled type 2 diabetes mellitus with hyperglycemia, without long-term current use of insulin  (HCC) -     glipiZIDE (GLIPIZIDE XL) 5 MG 24 hr tablet; Take 1 tablet (5 mg total) by mouth daily with breakfast. Patient will pick up scripts Monday. -     metFORMIN (GLUCOPHAGE) 1000 MG tablet; Take 1 tablet (1,000 mg total) by mouth 2 (two) times daily with a meal. Patient will pick up scripts Monday. -     TRUEplus Lancets 28G MISC; Use as directed -     glucose blood (TRUE METRIX BLOOD GLUCOSE TEST) test strip; Use as instructed.  Abdominal bloating -     omeprazole (PRILOSEC) 20 MG capsule; Take 1 capsule (20 mg total) by mouth daily.  Hypertriglyceridemia -     omega-3 acid ethyl esters (LOVAZA) 1 g capsule; Take 2 capsules (2 g total) by mouth 2 (two) times daily.     Follow Up Instructions Return in about 2 months (around 04/21/2019).     I discussed the assessment and treatment plan with the patient. The patient was  provided an opportunity to ask questions and all were answered. The patient agreed with the plan and demonstrated an understanding of the instructions.   The patient was advised to call back or seek an in-person evaluation if the symptoms worsen or if the condition fails to improve as anticipated.  I provided 21 minutes of non-face-to-face time during this encounter including median intraservice time, reviewing previous notes, labs, imaging, medications and explaining diagnosis and management.  Gildardo Pounds, FNP-BC

## 2019-02-21 ENCOUNTER — Encounter: Payer: Self-pay | Admitting: Nurse Practitioner

## 2019-02-28 ENCOUNTER — Telehealth: Payer: Self-pay | Admitting: Nurse Practitioner

## 2019-02-28 NOTE — Telephone Encounter (Signed)
Pt would like a copy of her OC as she never received the last one. She will pick I up sometime next week

## 2019-02-28 NOTE — Telephone Encounter (Signed)
I called back the Pt and informed her that is a copy of the OC will be at the FO

## 2019-03-27 MED FILL — metFORMIN HCL 1000 MG TABS: 1000 | 30 days supply | Qty: 60 | Fill #1

## 2019-03-27 MED FILL — ?OMEPRAZOLE 20MG CAP DR: 20 | 30 days supply | Qty: 30 | Fill #2

## 2019-03-27 MED FILL — LISINOPRIL 20 MG TABLET: 20 | 30 days supply | Qty: 30 | Fill #7

## 2019-04-11 ENCOUNTER — Encounter: Payer: Self-pay | Admitting: Nurse Practitioner

## 2019-04-11 ENCOUNTER — Ambulatory Visit: Payer: Self-pay | Attending: Nurse Practitioner | Admitting: Nurse Practitioner

## 2019-04-11 ENCOUNTER — Other Ambulatory Visit: Payer: Self-pay

## 2019-04-11 DIAGNOSIS — R87612 Low grade squamous intraepithelial lesion on cytologic smear of cervix (LGSIL): Secondary | ICD-10-CM

## 2019-04-11 DIAGNOSIS — R300 Dysuria: Secondary | ICD-10-CM

## 2019-04-11 DIAGNOSIS — I10 Essential (primary) hypertension: Secondary | ICD-10-CM

## 2019-04-11 DIAGNOSIS — E781 Pure hyperglyceridemia: Secondary | ICD-10-CM

## 2019-04-11 DIAGNOSIS — E1165 Type 2 diabetes mellitus with hyperglycemia: Secondary | ICD-10-CM

## 2019-04-11 DIAGNOSIS — E039 Hypothyroidism, unspecified: Secondary | ICD-10-CM

## 2019-04-11 MED ORDER — ATORVASTATIN CALCIUM 20 MG PO TABS: 20.0000 mg | ORAL_TABLET | Freq: Every day | ORAL | 0 refills | Status: DC

## 2019-04-11 MED ORDER — PHENAZOPYRIDINE HCL 100 MG PO TABS: 100.0000 mg | ORAL_TABLET | Freq: Three times a day (TID) | ORAL | 0 refills | Status: DC | PRN

## 2019-04-11 MED ORDER — LISINOPRIL 30 MG PO TABS: 30.0000 mg | ORAL_TABLET | Freq: Every day | ORAL | 0 refills | Status: DC

## 2019-04-11 MED ORDER — LEVOTHYROXINE SODIUM 100 MCG PO TABS: 100.0000 ug | ORAL_TABLET | Freq: Every day | ORAL | 2 refills | Status: DC

## 2019-04-11 MED ORDER — OMEGA-3-ACID ETHYL ESTERS 1 G PO CAPS: 2.0000 g | ORAL_CAPSULE | Freq: Two times a day (BID) | ORAL | 1 refills | Status: DC

## 2019-04-11 MED FILL — LEVOTHYROXINE SODIUM 100 MC: 100 | 30 days supply | Qty: 30 | Fill #0

## 2019-04-11 MED FILL — ?ATORVASTATIN 20 MG TABLET: 20 | 30 days supply | Qty: 30 | Fill #0

## 2019-04-11 MED FILL — LISINOPRIL 30 MG TABS: 30 | 30 days supply | Qty: 30 | Fill #0

## 2019-04-11 MED FILL — OMEGA-3 ETHYL ESTERS 1 GM C: 1 | 30 days supply | Qty: 120 | Fill #0

## 2019-04-11 NOTE — Progress Notes (Signed)
Virtual Visit via Telephone Note Due to national recommendations of social distancing due to COVID 19, telehealth visit is felt to be most appropriate for this patient at this time.  I discussed the limitations, risks, security and privacy concerns of performing an evaluation and management service by telephone and the availability of in person appointments. I also discussed with the patient that there may be a patient responsible charge related to this service. The patient expressed understanding and agreed to proceed.    I connected with Erin Huff on 04/11/19  at   9:30 AM EST  EDT by telephone and verified that I am speaking with the correct person using two identifiers.   Consent I discussed the limitations, risks, security and privacy concerns of performing an evaluation and management service by telephone and the availability of in person appointments. I also discussed with the patient that there may be a patient responsible charge related to this service. The patient expressed understanding and agreed to proceed.   Location of Patient: Private Residence   [facility] of Provider[facility]    Persons participating in Telemedicine visit: [name] FNP-BC [place] Alesia Banda CMA Chirsty Huff  [id number]   History of Present Illness: Telemedicine visit for: f/u  has a past medical history of Allergy, Depression, Diabetes mellitus without complication (HCC), GERD (gastroesophageal reflux disease), Heart murmur, Hyperlipidemia, Hypertension, and Thyroid disease.  UTI symptoms Intermittent dysuria and burning sensation. Onset 2 weeks. No other associated symptoms such as discharge,  pelvic pain, nausea, vomiting or hematuria.  Hypothyroidism Taking synthroid 100 mg daily as prescribed. Patient denies change in energy level, diarrhea, heat / cold intolerance, nervousness, palpitations and weight changes Lab Results  Component Value Date    TSH 3.530 10/09/2018    DM TYPE 2 Well controlled. Taking metformin 1000 mg BID and glipizide 5 mg daily as prescribed. She denies any symptoms of hypo or hyperglycemia. Monitoring her blood glucose levels once a day with readings 110-130s. Overdue for eye exam.  Lab Results  Component Value Date   HGBA1C 6.9 (A) 10/09/2018    Essential Hypertension Taking lisinopril 20 mg daily as prescribed. Blood pressure not well controlled. Will increase to 30 mg today. She endorses medication compliance. Denies chest pain, shortness of breath, palpitations, lightheadedness, dizziness, headaches or BLE edema.  BP Readings from Last 3 Encounters:  11/16/18 (!) 143/69  10/09/18 (!) 145/81  01/05/18 (!) 149/87    Past Medical History:  Diagnosis Date  . Allergy   . Depression    mild  . Diabetes mellitus without complication (HCC)   . GERD (gastroesophageal reflux disease)   . Heart murmur   . Hyperlipidemia   . Hypertension   . Thyroid disease     Past Surgical History:  Procedure Laterality Date  . NO PAST SURGERIES      Family History  Problem Relation Age of Onset  . Hypertension Sister   . Diabetes Brother   . Breast cancer Maternal Aunt   . Colon cancer Neg Hx   . Colon polyps Neg Hx   . Esophageal cancer Neg Hx   . Rectal cancer Neg Hx   . Stomach cancer Neg Hx     Social History   Socioeconomic History  . Marital status: Married    Spouse name: Not on file  . Number of children: Not on file  . Years of education: Not on file  . Highest education level: Not on file  Occupational History  .  Not on file  Tobacco Use  . Smoking status: Never Smoker  . Smokeless tobacco: Never Used  Substance and Sexual Activity  . Alcohol use: No    Comment: rare   . Drug use: No  . Sexual activity: Yes    Birth control/protection: None  Other Topics Concern  . Not on file  Social History Narrative   Sometimes husband is aggressive but she talks back and is aggressive with  him. Does not take it.    Social Determinants of Health   Financial Resource Strain:   . Difficulty of Paying Living Expenses: Not on file  Food Insecurity:   . Worried About Programme researcher, broadcasting/film/video in the Last Year: Not on file  . Ran Out of Food in the Last Year: Not on file  Transportation Needs:   . Lack of Transportation (Medical): Not on file  . Lack of Transportation (Non-Medical): Not on file  Physical Activity:   . Days of Exercise per Week: Not on file  . Minutes of Exercise per Session: Not on file  Stress:   . Feeling of Stress : Not on file  Social Connections:   . Frequency of Communication with Friends and Family: Not on file  . Frequency of Social Gatherings with Friends and Family: Not on file  . Attends Religious Services: Not on file  . Active Member of Clubs or Organizations: Not on file  . Attends Banker Meetings: Not on file  . Marital Status: Not on file     Observations/Objective: Awake, alert and oriented x 3   ROS  Assessment and Plan: Aunisty was seen today for dysuria.  Diagnoses and all orders for this visit:  Dysuria -     phenazopyridine (PYRIDIUM) 100 MG tablet; Take 1 tablet (100 mg total) by mouth 3 (three) times daily as needed for pain.  LGSIL on Pap smear of cervix -     Ambulatory referral to Gynecology  Acquired hypothyroidism -     levothyroxine (SYNTHROID) 100 MCG tablet; Take 1 tablet (100 mcg total) by mouth daily before breakfast. Patient will pick up scripts Monday.  Essential hypertension -     lisinopril (ZESTRIL) 30 MG tablet; Take 1 tablet (30 mg total) by mouth daily. Continue all antihypertensives as prescribed.  Remember to bring in your blood pressure log with you for your follow up appointment.  DASH/Mediterranean Diets are healthier choices for HTN.  Continue all antihypertensives as prescribed.  Remember to bring in your blood pressure log with you for your follow up appointment.   DASH/Mediterranean Diets are healthier choices for HTN.     Hypertriglyceridemia -     omega-3 acid ethyl esters (LOVAZA) 1 g capsule; Take 2 capsules (2 g total) by mouth 2 (two) times daily. -     atorvastatin (LIPITOR) 20 MG tablet; Take 1 tablet (20 mg total) by mouth daily. INSTRUCTIONS: Work on a low fat, heart healthy diet and participate in regular aerobic exercise program by working out at least 150 minutes per week; 5 days a week-30 minutes per day. Avoid red meat/beef/steak,  fried foods. junk foods, sodas, sugary drinks, unhealthy snacking, alcohol and smoking.  Drink at least 80 oz of water per day and monitor your carbohydrate intake daily.    Uncontrolled type 2 diabetes mellitus with hyperglycemia, without long-term current use of insulin (HCC) -     Ambulatory referral to Ophthalmology     Follow Up Instructions Return in about 3 months (  around 07/09/2019).     I discussed the assessment and treatment plan with the patient. The patient was provided an opportunity to ask questions and all were answered. The patient agreed with the plan and demonstrated an understanding of the instructions.   The patient was advised to call back or seek an in-person evaluation if the symptoms worsen or if the condition fails to improve as anticipated.  I provided 19 minutes of non-face-to-face time during this encounter including median intraservice time, reviewing previous notes, labs, imaging, medications and explaining diagnosis and management.  Gildardo Pounds, FNP-BC

## 2019-04-12 ENCOUNTER — Ambulatory Visit: Payer: No Typology Code available for payment source | Admitting: Nurse Practitioner

## 2019-04-17 ENCOUNTER — Other Ambulatory Visit: Payer: Self-pay | Admitting: Nurse Practitioner

## 2019-04-17 ENCOUNTER — Other Ambulatory Visit: Payer: Self-pay

## 2019-04-17 ENCOUNTER — Ambulatory Visit: Payer: Self-pay | Attending: Nurse Practitioner

## 2019-04-17 DIAGNOSIS — E039 Hypothyroidism, unspecified: Secondary | ICD-10-CM

## 2019-04-17 DIAGNOSIS — Z130 Encounter for screening for diseases of the blood and blood-forming organs and certain disorders involving the immune mechanism: Secondary | ICD-10-CM

## 2019-04-17 DIAGNOSIS — E1165 Type 2 diabetes mellitus with hyperglycemia: Secondary | ICD-10-CM

## 2019-04-17 DIAGNOSIS — I10 Essential (primary) hypertension: Secondary | ICD-10-CM

## 2019-04-17 DIAGNOSIS — E7800 Pure hypercholesterolemia, unspecified: Secondary | ICD-10-CM

## 2019-04-17 MED FILL — glipiZIDE XL 5 MG TB24: 5 | 30 days supply | Qty: 30 | Fill #0

## 2019-04-18 LAB — CBC
Hematocrit: 38.5 % (ref 34.0–46.6)
Hemoglobin: 13.3 g/dL (ref 11.1–15.9)
MCH: 30.4 pg (ref 26.6–33.0)
MCHC: 34.5 g/dL (ref 31.5–35.7)
MCV: 88 fL (ref 79–97)
Platelets: 238 10*3/uL (ref 150–450)
RBC: 4.38 x10E6/uL (ref 3.77–5.28)
RDW: 12.3 % (ref 11.7–15.4)
WBC: 8.2 10*3/uL (ref 3.4–10.8)

## 2019-04-18 LAB — CMP14+EGFR
ALT: 30 IU/L (ref 0–32)
AST: 31 IU/L (ref 0–40)
Albumin/Globulin Ratio: 1.8 (ref 1.2–2.2)
Albumin: 4.8 g/dL (ref 3.8–4.8)
Alkaline Phosphatase: 109 IU/L (ref 39–117)
BUN/Creatinine Ratio: 17 (ref 12–28)
BUN: 13 mg/dL (ref 8–27)
Bilirubin Total: 0.5 mg/dL (ref 0.0–1.2)
CO2: 21 mmol/L (ref 20–29)
Calcium: 9.9 mg/dL (ref 8.7–10.3)
Chloride: 99 mmol/L (ref 96–106)
Creatinine, Ser: 0.77 mg/dL (ref 0.57–1.00)
GFR calc Af Amer: 96 mL/min/{1.73_m2} (ref >59)
GFR calc non Af Amer: 83 mL/min/{1.73_m2} (ref >59)
Globulin, Total: 2.7 g/dL (ref 1.5–4.5)
Glucose: 169 mg/dL — ABNORMAL HIGH (ref 65–99)
Potassium: 4.7 mmol/L (ref 3.5–5.2)
Sodium: 135 mmol/L (ref 134–144)
Total Protein: 7.5 g/dL (ref 6.0–8.5)

## 2019-04-18 LAB — LIPID PANEL
Chol/HDL Ratio: 3.4 ratio (ref 0.0–4.4)
Cholesterol, Total: 201 mg/dL — ABNORMAL HIGH (ref 100–199)
HDL: 60 mg/dL (ref >39)
LDL Chol Calc (NIH): 110 mg/dL — ABNORMAL HIGH (ref 0–99)
Triglycerides: 182 mg/dL — ABNORMAL HIGH (ref 0–149)
VLDL Cholesterol Cal: 31 mg/dL (ref 5–40)

## 2019-04-18 LAB — HEMOGLOBIN A1C
Est. average glucose Bld gHb Est-mCnc: 171 mg/dL
Hgb A1c MFr Bld: 7.6 % — ABNORMAL HIGH (ref 4.8–5.6)

## 2019-04-18 LAB — TSH: TSH: 3.38 u[IU]/mL (ref 0.450–4.500)

## 2019-04-24 ENCOUNTER — Telehealth: Payer: Self-pay | Admitting: Nurse Practitioner

## 2019-04-24 NOTE — Telephone Encounter (Signed)
Patient is requesting a call back on Labs for cholesterol. Please call patient back.

## 2019-04-26 NOTE — Telephone Encounter (Signed)
Spoke to patient and informed on lab results.  Spanish interpreter Pacific assist with the call.

## 2019-04-29 MED FILL — metFORMIN HCL 1000 MG TABS: 1000 | 30 days supply | Qty: 60 | Fill #2

## 2019-05-03 ENCOUNTER — Ambulatory Visit: Payer: No Typology Code available for payment source | Admitting: Nurse Practitioner

## 2019-05-03 MED FILL — PHENAZOPYRIDINE 100 MG TAB: 100 | 3 days supply | Qty: 10 | Fill #0

## 2019-05-08 ENCOUNTER — Other Ambulatory Visit: Payer: Self-pay

## 2019-05-08 ENCOUNTER — Ambulatory Visit: Payer: No Typology Code available for payment source | Admitting: Family Medicine

## 2019-05-08 ENCOUNTER — Telehealth: Payer: Self-pay | Admitting: Nurse Practitioner

## 2019-05-08 NOTE — Telephone Encounter (Signed)
Patient called saying that she went to the gynecologist and was not seen because she did not have insurance. Please refer to the referral placed.

## 2019-05-10 NOTE — Telephone Encounter (Signed)
Patient need to apply for the [facility]. I sent an in basket to  [employer] and Wardell Heath they will contact patient .

## 2019-05-17 ENCOUNTER — Ambulatory Visit: Payer: Self-pay | Attending: Nurse Practitioner | Admitting: Nurse Practitioner

## 2019-05-17 ENCOUNTER — Other Ambulatory Visit: Payer: Self-pay

## 2019-05-17 ENCOUNTER — Encounter: Payer: Self-pay | Admitting: Nurse Practitioner

## 2019-05-17 DIAGNOSIS — E7800 Pure hypercholesterolemia, unspecified: Secondary | ICD-10-CM

## 2019-05-17 NOTE — Progress Notes (Signed)
Virtual Visit via Telephone Note Due to national recommendations of social distancing due to [place] 19, telehealth visit is felt to be most appropriate for this patient at this time.  I discussed the limitations, risks, security and privacy concerns of performing an evaluation and management service by telephone and the availability of in person appointments. I also discussed with the patient that there may be a patient responsible charge related to this service. The patient expressed understanding and agreed to proceed.    I connected with Erin Huff on 05/17/19  at   2:10 PM EDT  EDT by telephone and verified that I am speaking with the correct person using two identifiers.   Consent I discussed the limitations, risks, security and privacy concerns of performing an evaluation and management service by telephone and the availability of in person appointments. I also discussed with the patient that there may be a patient responsible charge related to this service. The patient expressed understanding and agreed to proceed.   Location of Patient: Private Residence   Location of Provider: [place] and [place] participating in Telemedicine visit: [name] FNP-BC [place] Interpreter ID# [phone]   History of Present Illness: Telemedicine visit for: Cholesterol  Dyslipidemia She had stopped taking atorvastatin as she did not like the way it made her feel. I instructed her to take omega 3 two capsules twice a day. She has only been taking 1 capsule twice a day. Cholesterol levels are not well controlled. I have instructed her that she should be taking 2 capsules twice a day.  Lab Results  Component Value Date   CHOL 201 (H) 04/17/2019   CHOL 150 10/09/2018   CHOL 165 01/05/2018   Lab Results  Component Value Date   HDL 60 04/17/2019   HDL 62 10/09/2018   HDL 53 01/05/2018   Lab Results   Component Value Date   LDLCALC 110 (H) 04/17/2019   LDLCALC 57 10/09/2018   LDLCALC 40 01/05/2018   Lab Results  Component Value Date   TRIG 182 (H) 04/17/2019   TRIG 154 (H) 10/09/2018   TRIG 360 (H) 01/05/2018   Lab Results  Component Value Date   CHOLHDL 3.4 04/17/2019   CHOLHDL 2.4 10/09/2018   CHOLHDL 3.1 01/05/2018    She is inquiring if it is okay for her to receive the vaccine. I have instructed her that she has no contraindications to receiving the vaccine at this time.   Past Medical History:  Diagnosis Date  . Allergy   . Depression    mild  . Diabetes mellitus without complication ([place])   . GERD (gastroesophageal reflux disease)   . Heart murmur   . Hyperlipidemia   . Hypertension   . Thyroid disease     Past Surgical History:  Procedure Laterality Date  . NO PAST SURGERIES      Family History  Problem Relation Age of Onset  . Hypertension Sister   . Diabetes Brother   . Breast cancer Maternal Aunt   . Colon cancer Neg Hx   . Colon polyps Neg Hx   . Esophageal cancer Neg Hx   . Rectal cancer Neg Hx   . Stomach cancer Neg Hx     Social History   Socioeconomic History  . Marital status: Married    Spouse name: Not on file  . Number of children: Not on file  . Years of education: Not on file  .  Highest education level: Not on file  Occupational History  . Not on file  Tobacco Use  . Smoking status: Never Smoker  . Smokeless tobacco: Never Used  Substance and Sexual Activity  . Alcohol use: No    Comment: rare   . Drug use: No  . Sexual activity: Yes    Birth control/protection: None  Other Topics Concern  . Not on file  Social History Narrative   Sometimes husband is aggressive but she talks back and is aggressive with him. Does not take it.    Social Determinants of Health   Financial Resource Strain:   . Difficulty of Paying Living Expenses:   Food Insecurity:   . Worried About Programme researcher, broadcasting/film/video in the Last Year:   . Garment/textile technologist in the Last Year:   Transportation Needs:   . Freight forwarder (Medical):   Marland Kitchen Lack of Transportation (Non-Medical):   Physical Activity:   . Days of Exercise per Week:   . Minutes of Exercise per Session:   Stress:   . Feeling of Stress :   Social Connections:   . Frequency of Communication with Friends and Family:   . Frequency of Social Gatherings with Friends and Family:   . Attends Religious Services:   . Active Member of Clubs or Organizations:   . Attends Banker Meetings:   Marland Kitchen Marital Status:      Observations/Objective: Awake, alert and oriented x 3   Review of Systems  Constitutional: Negative for fever, malaise/fatigue and weight loss.  HENT: Negative.  Negative for nosebleeds.   Eyes: Negative.  Negative for blurred vision, double vision and photophobia.  Respiratory: Negative.  Negative for cough and shortness of breath.   Cardiovascular: Negative.  Negative for chest pain, palpitations and leg swelling.  Gastrointestinal: Negative.  Negative for heartburn, nausea and vomiting.  Musculoskeletal: Negative.  Negative for myalgias.  Neurological: Negative.  Negative for dizziness, focal weakness, seizures and headaches.  Psychiatric/Behavioral: Negative.  Negative for suicidal ideas.    Assessment and Plan: Giani was seen today for follow-up.  Diagnoses and all orders for this visit:  HYPERCHOLESTEROLEMIA Continue omega 3 as prescribed.   Follow Up Instructions Return in about 7 weeks (around 07/05/2019).     I discussed the assessment and treatment plan with the patient. The patient was provided an opportunity to ask questions and all were answered. The patient agreed with the plan and demonstrated an understanding of the instructions.   The patient was advised to call back or seek an in-person evaluation if the symptoms worsen or if the condition fails to improve as anticipated.  I provided 15 minutes of non-face-to-face time  during this encounter including median intraservice time, reviewing previous notes, labs, imaging, medications and explaining diagnosis and management.  Claiborne Rigg, FNP-BC

## 2019-05-21 MED FILL — LISINOPRIL 30 MG TABS: 30 | 30 days supply | Qty: 30 | Fill #1

## 2019-05-21 MED FILL — glipiZIDE XL 5 MG TB24: 5 | 30 days supply | Qty: 30 | Fill #1

## 2019-05-21 MED FILL — LEVOTHYROXINE SODIUM 100 MC: 100 | 30 days supply | Qty: 30 | Fill #1

## 2019-05-21 MED FILL — metFORMIN HCL 1000 MG TABS: 1000 | 30 days supply | Qty: 60 | Fill #3

## 2019-05-22 MED FILL — TRUE METRIX TEST STRIP: 25 days supply | Qty: 100 | Fill #3

## 2019-06-04 ENCOUNTER — Ambulatory Visit: Payer: Self-pay | Admitting: Medical

## 2019-06-04 ENCOUNTER — Encounter: Payer: Self-pay | Admitting: Medical

## 2019-06-04 ENCOUNTER — Other Ambulatory Visit: Payer: Self-pay

## 2019-06-04 VITALS — BP 136/78 | Temp 98.9°F | Wt 173.0 lb

## 2019-06-04 DIAGNOSIS — R87612 Low grade squamous intraepithelial lesion on cytologic smear of cervix (LGSIL): Secondary | ICD-10-CM

## 2019-06-04 NOTE — Progress Notes (Signed)
[name] is a 64 y.o. G5P0 female who presents to [facility] clinic today with no complaints.    Pap Smear: Pap smear completed today. Last Pap smear was 03/31/2017 at [facility] was abnormal - LSIL +HPV. 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 Per patient has history of an abnormal Pap smear. Last Pap smear result is available in Epic.   Physical exam: Breasts Breasts symmetrical. No skin abnormalities bilateral breasts. No nipple retraction bilateral breasts. No nipple discharge bilateral breasts. No lymphadenopathy. No lumps palpated bilateral breasts.       Pelvic/Bimanual Ext Genitalia No lesions, no swelling and no discharge observed on external genitalia. Mild erythema noted.    Vagina Vagina pink and normal texture. No lesions or abnormal discharge observed in vagina.        Cervix Cervix is present. Cervix pink and of normal texture. No discharge observed. Scant bleeding following pap smear.   Uterus Uterus is present. Uterus in normal position and normal size.        Adnexae Bilateral ovaries present. No tenderness on palpation.         Rectovaginal No rectal exam completed today since patient had no rectal complaints. No skin abnormalities observed on exam.     Smoking History: Patient has never smoked.    Patient Navigation: Patient education provided. Access to services provided for patient through [facility] program. Interpreter provided.    Colorectal Cancer Screening: Per patient has never had colonoscopy completed No complaints today.    Breast and Cervical Cancer Risk Assessment: Patient has family history of breast cancer (maternal aunt), no known genetic mutations, or radiation treatment to the chest before age 87. Patient has history of cervical dysplasia (LSIL), no history of immunocompromised state or DES exposure in-utero.  Risk Assessment    Risk Scores      06/04/2019   Last edited by: Narda Rutherford, LPN   5-year risk: 0.9 %   Lifetime risk: 4.3 %          A: B[facility]CP exam with pap smear H/O abnormal pap smear, LSIL with +HPV in 2019  P: Ref[facility] patient to [facility] for a screening mammogram. Patient to call and schedule in November 2021.  Marny Lowenstein, PA-C 06/04/2019 3:42 PM

## 2019-06-04 NOTE — Patient Instructions (Signed)
Prueba de Papanicolaou Pap Test Por qu me debo realizar esta prueba? La prueba de Papanicolaou, tambin denominada citologa vaginal, es una prueba de cribado para detectar signos de:  Cncer de la vagina, del cuello uterino y del tero. El cuello uterino es la parte baja del tero que se abre hacia la vagina.  Infeccin.  Cambios que podran ser un signo de que se est desarrollando un cncer (cambios precancerosos). Las mujeres deben realizarse esta prueba con regularidad. En general, debe hacerse una prueba de Papanicolaou cada 3 aos hasta alcanzar la menopausia o hasta los 65 aos. Las mujeres entre 30 y 60 aos de edad pueden elegir realizarse la prueba de Papanicolaou al mismo tiempo que la prueba del VPH (virus del papiloma humano) cada 5 aos (en lugar de cada 3 aos). El mdico puede recomendarle que se realice pruebas de Papanicolaou con ms o menos frecuencia en funcin de sus afecciones mdicas y los resultados de la prueba de Papanicolaou anterior. Qu tipo de muestra se toma?  El mdico recolectar una muestra de clulas de la superficie del cuello uterino. Lo har utilizando un pequeo hisopo de algodn, una esptula de plstico o un cepillo. Esta muestra se recolecta durante un examen plvico, mientras usted est recostada boca arriba sobre la mesa de examen con los pies en los descansos para pies (estribos). En algunos casos, tambin pueden recolectarse fluidos (secreciones) del cuello uterino y la vagina. Cmo debo prepararme para esta prueba?  Tenga en cuenta en qu etapa del ciclo menstrual se encuentra. Es posible que se le pida que vuelva a programar la prueba si est menstruando el da en que debe realizrsela.  Si el da en que debe realizarse la prueba tiene una infeccin vaginal aparente, deber volver a programar la prueba.  Siga las indicaciones del mdico acerca de lo siguiente: ? Cambiar o suspender los medicamentos que toma habitualmente. Algunos  medicamentos pueden provocar resultados anormales de la prueba, como los digitlicos y la tetraciclina. ? Evite las duchas vaginales o los baos de inmersin el da de la prueba o el da anterior. Informe al mdico acerca de lo siguiente:  Cualquier alergia que tenga.  Todos los medicamentos que utiliza, incluidos vitaminas, hierbas, gotas oftlmicas, cremas y medicamentos de venta libre.  Cualquier enfermedad de la sangre que tenga.  Cirugas a las que se someti.  Cualquier afeccin mdica que tenga.  Si est embarazada o podra estarlo. Cmo se informan los resultados? Los resultados de la prueba se informarn como anormales o normales. Puede producirse un resultado positivo falso. Este tipo de resultado es incorrecto porque indica que una enfermedad est presente cuando en realidad no lo est. Puede producirse un resultado negativo falso. Este tipo de resultado es incorrecto porque indica que una enfermedad no est presente cuando en realidad lo est. Qu significan los resultados? Un resultado normal en la prueba significa que no tiene signos de cncer de la vagina, del cuello uterino o del tero. Un resultado anormal puede significar que tiene:  Cncer. Una prueba de Papanicolaou por s sola no es suficiente para diagnosticar cncer. En este caso, se le realizarn ms pruebas.  Cambios precancerosos en la vagina, cuello uterino o tero.  Inflamacin del cuello uterino.  Enfermedades de transmisin sexual (ETS).  Infecciones por hongos.  Infecciones por parsitos. Hable con su mdico sobre lo que significan sus resultados. Preguntas para hacerle al mdico Consulte a su mdico o pregunte en el departamento donde se realiza la prueba acerca de lo siguiente:    Cundo estarn disponibles mis resultados?  Cmo obtendr mis resultados?  Cules son mis opciones de tratamiento?  Qu otras pruebas necesito?  Cules son los prximos pasos que debo seguir? Resumen  En  general, las mujeres deben hacerse una prueba de Papanicolaou cada 3 aos hasta alcanzar la menopausia o hasta los 65 aos de edad.  El mdico recolectar una muestra de clulas de la superficie del cuello uterino. Lo har utilizando un pequeo hisopo de algodn, una esptula de plstico o un cepillo.  En algunos casos, tambin pueden recolectarse fluidos (secreciones) del cuello uterino y la vagina. Esta informacin no tiene como fin reemplazar el consejo del mdico. Asegrese de hacerle al mdico cualquier pregunta que tenga. Document Revised: 01/17/2017 Document Reviewed: 01/17/2017 Elsevier Patient Education  2020 Elsevier Inc.  

## 2019-06-07 LAB — CYTOLOGY - PAP
Comment: NEGATIVE
High risk HPV: POSITIVE — AB

## 2019-06-10 ENCOUNTER — Telehealth: Payer: Self-pay

## 2019-06-10 NOTE — Telephone Encounter (Signed)
Via Delorise Royals, Spanish Interpreter, patient informed pap smear results not changed, LGSIL with positive HPV. Per Vonzella Nipple, PA-C repeat pap smear within 1 year. Patient verbalized understanding, requested information regarding to be mailed to her. (late entry-call took place on 06/07/2019).

## 2019-06-18 ENCOUNTER — Ambulatory Visit: Payer: No Typology Code available for payment source | Admitting: Family Medicine

## 2019-06-19 MED FILL — metFORMIN HCL 1000 MG TABS: 1000 | 30 days supply | Qty: 60 | Fill #4

## 2019-06-19 MED FILL — LEVOTHYROXINE SODIUM 100 MC: 100 | 30 days supply | Qty: 30 | Fill #2

## 2019-06-19 MED FILL — ?OMEPRAZOLE 20 MG CAP DR: 20 | 30 days supply | Qty: 30 | Fill #3

## 2019-06-19 MED FILL — glipiZIDE XL 5 MG TB24: 5 | 30 days supply | Qty: 30 | Fill #2

## 2019-06-21 MED FILL — LISINOPRIL 20 MG TABLET: 20 | 30 days supply | Qty: 30 | Fill #8

## 2019-07-02 ENCOUNTER — Telehealth: Payer: Self-pay | Admitting: Nurse Practitioner

## 2019-07-02 NOTE — Telephone Encounter (Signed)
Patient called requesting to speak with Mikle Bosworth regarding some forms she would like to ask him. Patient states she will need to re-apply for the CAFA and OC

## 2019-07-02 NOTE — Telephone Encounter (Signed)
Pt was call and schedule a financial appt

## 2019-07-12 ENCOUNTER — Ambulatory Visit: Payer: No Typology Code available for payment source

## 2019-07-15 ENCOUNTER — Ambulatory Visit: Payer: Self-pay | Attending: Nurse Practitioner | Admitting: Nurse Practitioner

## 2019-07-15 ENCOUNTER — Other Ambulatory Visit: Payer: Self-pay

## 2019-07-15 ENCOUNTER — Encounter: Payer: Self-pay | Admitting: Nurse Practitioner

## 2019-07-15 VITALS — BP 158/76 | HR 83 | Temp 97.9°F | Ht 60.0 in | Wt 173.2 lb

## 2019-07-15 DIAGNOSIS — E1165 Type 2 diabetes mellitus with hyperglycemia: Secondary | ICD-10-CM

## 2019-07-15 DIAGNOSIS — J309 Allergic rhinitis, unspecified: Secondary | ICD-10-CM

## 2019-07-15 DIAGNOSIS — I10 Essential (primary) hypertension: Secondary | ICD-10-CM

## 2019-07-15 DIAGNOSIS — E781 Pure hyperglyceridemia: Secondary | ICD-10-CM

## 2019-07-15 DIAGNOSIS — E039 Hypothyroidism, unspecified: Secondary | ICD-10-CM

## 2019-07-15 DIAGNOSIS — R140 Abdominal distension (gaseous): Secondary | ICD-10-CM

## 2019-07-15 LAB — POCT GLYCOSYLATED HEMOGLOBIN (HGB A1C): Hemoglobin A1C: 7.6 % — AB (ref 4.0–5.6)

## 2019-07-15 LAB — GLUCOSE, POCT (MANUAL RESULT ENTRY): POC Glucose: 258 mg/dl — AB (ref 70–99)

## 2019-07-15 MED ORDER — LEVOTHYROXINE SODIUM 100 MCG PO TABS: 100.0000 ug | ORAL_TABLET | Freq: Every day | ORAL | 2 refills | Status: DC

## 2019-07-15 MED ORDER — LISINOPRIL 30 MG PO TABS: 30.0000 mg | ORAL_TABLET | Freq: Every day | ORAL | 0 refills | Status: DC

## 2019-07-15 MED ORDER — METFORMIN HCL 1000 MG PO TABS: 1000.0000 mg | ORAL_TABLET | Freq: Two times a day (BID) | ORAL | 5 refills | Status: DC

## 2019-07-15 MED ORDER — GLIPIZIDE ER 5 MG PO TB24: 5.0000 mg | ORAL_TABLET | Freq: Every day | ORAL | 1 refills | Status: DC

## 2019-07-15 MED ORDER — TRUEPLUS LANCETS 28G MISC: 5 refills | Status: DC

## 2019-07-15 MED ORDER — LORATADINE 10 MG PO TABS: 10.0000 mg | ORAL_TABLET | Freq: Every day | ORAL | 11 refills | Status: DC

## 2019-07-15 MED ORDER — TRUE METRIX BLOOD GLUCOSE TEST VI STRP: ORAL_STRIP | 12 refills | Status: DC

## 2019-07-15 MED ORDER — OMEPRAZOLE 20 MG PO CPDR: 20.0000 mg | DELAYED_RELEASE_CAPSULE | Freq: Every day | ORAL | 1 refills | Status: DC

## 2019-07-15 MED ORDER — OMEGA-3-ACID ETHYL ESTERS 1 G PO CAPS: 2.0000 g | ORAL_CAPSULE | Freq: Two times a day (BID) | ORAL | 1 refills | Status: DC

## 2019-07-15 MED FILL — glipiZIDE XL 5 MG TB24: 5 | 30 days supply | Qty: 30 | Fill #0

## 2019-07-15 MED FILL — LISINOPRIL 30 MG TABS: 30 | 30 days supply | Qty: 30 | Fill #0

## 2019-07-15 MED FILL — OMEGA-3 ETHYL ESTERS 1 GM C: 1 | 30 days supply | Qty: 120 | Fill #0

## 2019-07-15 MED FILL — LEVOTHYROXINE SODIUM 100 MC: 100 | 30 days supply | Qty: 30 | Fill #0

## 2019-07-15 MED FILL — TRUE METRIX TEST STRIP: 30 days supply | Qty: 100 | Fill #0

## 2019-07-15 MED FILL — TRUEplus LANCETS 28G MISC: 30 days supply | Qty: 100 | Fill #0

## 2019-07-15 MED FILL — metFORMIN HCL 1000 MG TABS: 1000 | 30 days supply | Qty: 60 | Fill #0

## 2019-07-15 MED FILL — OMEPRAZOLE 20 MG CAP: 20 | 30 days supply | Qty: 30 | Fill #0

## 2019-07-15 NOTE — Patient Instructions (Addendum)
Recommend: Modern[employer] or [employer]

## 2019-07-15 NOTE — Progress Notes (Signed)
Assessment & Plan:  Erin Huff was seen today for follow-up.  Diagnoses and all orders for this visit:  Uncontrolled type 2 diabetes mellitus with hyperglycemia, without long-term current use of insulin (HCC) -     Glucose (CBG) -     HgB A1c -     Microalbumin/Creatinine Ratio, Urine -     glipiZIDE (GLIPIZIDE XL) 5 MG 24 hr tablet; Take 1 tablet (5 mg total) by mouth daily with breakfast. Patient will pick up scripts Monday. -     glucose blood (TRUE METRIX BLOOD GLUCOSE TEST) test strip; Use as instructed. -     metFORMIN (GLUCOPHAGE) 1000 MG tablet; Take 1 tablet (1,000 mg total) by mouth 2 (two) times daily with a meal. Patient will pick up scripts Monday. -     TRUEplus Lancets 28G MISC; Use as directed Continue blood sugar control as discussed in office today, low carbohydrate diet, and regular physical exercise as tolerated, 150 minutes per week (30 min each day, 5 days per week, or 50 min 3 days per week). Keep blood sugar logs with fasting goal of 90-130 mg/dl, post prandial (after you eat) less than 180.  For Hypoglycemia: BS <60 and Hyperglycemia BS >400; contact the clinic ASAP. Annual eye exams and foot exams are recommended.  Acquired hypothyroidism -     levothyroxine (SYNTHROID) 100 MCG tablet; Take 1 tablet (100 mcg total) by mouth daily before breakfast. Patient will pick up scripts Monday.  Essential hypertension -     lisinopril (ZESTRIL) 30 MG tablet; Take 1 tablet (30 mg total) by mouth daily. -     Basic metabolic panel Continue all antihypertensives as prescribed.  Remember to bring in your blood pressure log with you for your follow up appointment.  DASH/Mediterranean Diets are healthier choices for HTN.    Allergic rhinitis, unspecified seasonality, unspecified trigger -     loratadine (CLARITIN) 10 MG tablet; Take 1 tablet (10 mg total) by mouth daily.  Hypertriglyceridemia -     omega-3 acid ethyl esters (LOVAZA) 1 g capsule; Take 2 capsules (2 g total)  by mouth 2 (two) times daily. -     Lipid panel INSTRUCTIONS: Work on a low fat, heart healthy diet and participate in regular aerobic exercise program by working out at least 150 minutes per week; 5 days a week-30 minutes per day. Avoid red meat/beef/steak,  fried foods. junk foods, sodas, sugary drinks, unhealthy snacking, alcohol and smoking.  Drink at least 80 oz of water per day and monitor your carbohydrate intake daily.    GERD -     omeprazole (PRILOSEC) 20 MG capsule; Take 1 capsule (20 mg total) by mouth daily. \INSTRUCTIONS: Avoid GERD Triggers: acidic, spicy or fried foods, caffeine, coffee, sodas,  alcohol and chocolate.    Patient has been counseled on age-appropriate routine health concerns for screening and prevention. These are reviewed and up-to-date. Referrals have been placed accordingly. Immunizations are up-to-date or declined.    Subjective:   Chief Complaint  Patient presents with  . Follow-up    Pt. is here for diabetes follow up.    HPI Erin Huff 64 y.o. female presents to office today for follow up.  She is questioning whether she can get the COVID vaccine as she has a history of heart murmur. I have instructed her that she would be a candidate for the vaccine despite her medical history.   DM TYPE 2 Taking glipizide 5 mg daily and metformin 1000 mg BID.  Overdue for eye exam. Patient has been advised to apply for financial assistance and schedule to see our financial counselor. Has not been routinely monitoring blood glucose levels at home.  Lab Results  Component Value Date   HGBA1C 7.6 (H) 04/17/2019   Dyslipidemia Taking lovaza 2gm BID. Denies any statin intolerance or myalgias. LDL not at goal. Poor dietary adherence.  Lab Results  Component Value Date   LDLCALC 110 (H) 04/17/2019   Essential Hypertension Not Well controlled. Not taking lisinopril 30 mg daily. Denies chest pain, shortness of breath, palpitations, lightheadedness,  dizziness, headaches or BLE edema.  BP Readings from Last 3 Encounters:  07/15/19 (!) 158/76  06/04/19 136/78  11/16/18 (!) 143/69      Review of Systems  Constitutional: Negative for fever, malaise/fatigue and weight loss.  HENT: Negative.  Negative for nosebleeds.   Eyes: Negative.  Negative for blurred vision, double vision and photophobia.  Respiratory: Negative.  Negative for cough and shortness of breath.   Cardiovascular: Negative.  Negative for chest pain, palpitations and leg swelling.  Gastrointestinal: Negative.  Negative for heartburn, nausea and vomiting.  Musculoskeletal: Negative.  Negative for myalgias.  Neurological: Negative.  Negative for dizziness, focal weakness, seizures and headaches.  Endo/Heme/Allergies: Positive for environmental allergies.  Psychiatric/Behavioral: Negative.  Negative for suicidal ideas.    Past Medical History:  Diagnosis Date  . Allergy   . Depression    mild  . Diabetes mellitus without complication ([place])   . GERD (gastroesophageal reflux disease)   . Heart murmur   . Hyperlipidemia   . Hypertension   . Thyroid disease     Past Surgical History:  Procedure Laterality Date  . NO PAST SURGERIES      Family History  Problem Relation Age of Onset  . Hypertension Sister   . Diabetes Brother   . Breast cancer Maternal Aunt   . Colon cancer Neg Hx   . Colon polyps Neg Hx   . Esophageal cancer Neg Hx   . Rectal cancer Neg Hx   . Stomach cancer Neg Hx     Social History Reviewed with no changes to be made today.   Outpatient Medications Prior to Visit  Medication Sig Dispense Refill  . Blood Glucose Monitoring Suppl (TRUE METRIX METER) w/Device KIT Use as directed 1 kit 0  . Multiple Vitamin (MULTIVITAMIN) tablet Take 1 tablet by mouth daily.    Marland Kitchen glipiZIDE (GLIPIZIDE XL) 5 MG 24 hr tablet Take 1 tablet (5 mg total) by mouth daily with breakfast. Patient will pick up scripts Monday. 90 tablet 1  . glucose blood (TRUE  METRIX BLOOD GLUCOSE TEST) test strip Use as instructed. 100 each 12  . loratadine (CLARITIN) 10 MG tablet Take 1 tablet (10 mg total) by mouth daily. 30 tablet 11  . metFORMIN (GLUCOPHAGE) 1000 MG tablet Take 1 tablet (1,000 mg total) by mouth 2 (two) times daily with a meal. Patient will pick up scripts Monday. 60 tablet 5  . TRUEplus Lancets 28G MISC Use as directed 100 each 5  . OVER THE COUNTER MEDICATION nutrilite carb blocker as needed- helps with constipation    . fluticasone (FLONASE) 50 MCG/ACT nasal spray Place 2 sprays into both nostrils daily. (Patient not taking: Reported on 06/04/2019) 16 g 6  . levothyroxine (SYNTHROID) 100 MCG tablet Take 1 tablet (100 mcg total) by mouth daily before breakfast. Patient will pick up scripts Monday. 30 tablet 2  . lisinopril (ZESTRIL) 30 MG tablet Take 1  tablet (30 mg total) by mouth daily. 90 tablet 0  . omega-3 acid ethyl esters (LOVAZA) 1 g capsule Take 2 capsules (2 g total) by mouth 2 (two) times daily. 360 capsule 1  . omeprazole (PRILOSEC) 20 MG capsule Take 1 capsule (20 mg total) by mouth daily. 90 capsule 1  . phenazopyridine (PYRIDIUM) 100 MG tablet Take 1 tablet (100 mg total) by mouth 3 (three) times daily as needed for pain. (Patient not taking: Reported on 07/15/2019) 10 tablet 0   Facility-Administered Medications Prior to Visit  Medication Dose Route Frequency Provider Last Rate Last Admin  . 0.9 %  sodium chloride infusion  500 mL Intravenous Continuous Pyrtle, Lajuan Lines, MD        Allergies  Allergen Reactions  . Motrin [Ibuprofen] Rash  . Penicillins Rash       Objective:    BP (!) 158/76 (BP Location: Right Arm, Patient Position: Sitting, Cuff Size: Normal)   Pulse 83   Temp 97.9 F (36.6 C) (Temporal)   Ht 5' (1.524 m)   Wt 173 lb 3.2 oz (78.6 kg)   SpO2 98%   BMI 33.83 kg/m  Wt Readings from Last 3 Encounters:  07/15/19 173 lb 3.2 oz (78.6 kg)  06/04/19 173 lb (78.5 kg)  11/16/18 171 lb 3.2 oz (77.7 kg)     Physical Exam Vitals and nursing note reviewed.  Constitutional:      Appearance: She is well-developed.  HENT:     Head: Normocephalic and atraumatic.  Cardiovascular:     Rate and Rhythm: Normal rate and regular rhythm.     Heart sounds: Normal heart sounds. No murmur. No friction rub. No gallop.   Pulmonary:     Effort: Pulmonary effort is normal. No tachypnea or respiratory distress.     Breath sounds: Normal breath sounds. No decreased breath sounds, wheezing, rhonchi or rales.  Chest:     Chest wall: No tenderness.  Abdominal:     General: Bowel sounds are normal.     Palpations: Abdomen is soft.  Musculoskeletal:        General: Normal range of motion.     Cervical back: Normal range of motion.  Skin:    General: Skin is warm and dry.  Neurological:     Mental Status: She is alert and oriented to person, place, and time.     Coordination: Coordination normal.  Psychiatric:        Behavior: Behavior normal. Behavior is cooperative.        Thought Content: Thought content normal.        Judgment: Judgment normal.          Patient has been counseled extensively about nutrition and exercise as well as the importance of adherence with medications and regular follow-up. The patient was given clear instructions to go to ER or return to medical center if symptoms don't improve, worsen or new problems develop. The patient verbalized understanding.   Follow-up: Return in about 3 months (around 10/15/2019).   Gildardo Pounds, FNP-BC [facility] [place], [place]   07/15/2019, 2:59 PM

## 2019-07-16 ENCOUNTER — Ambulatory Visit: Payer: No Typology Code available for payment source | Admitting: Family Medicine

## 2019-07-16 LAB — LIPID PANEL
Chol/HDL Ratio: 3.3 ratio (ref 0.0–4.4)
Cholesterol, Total: 206 mg/dL — ABNORMAL HIGH (ref 100–199)
HDL: 62 mg/dL (ref >39)
LDL Chol Calc (NIH): 115 mg/dL — ABNORMAL HIGH (ref 0–99)
Triglycerides: 170 mg/dL — ABNORMAL HIGH (ref 0–149)
VLDL Cholesterol Cal: 29 mg/dL (ref 5–40)

## 2019-07-16 LAB — BASIC METABOLIC PANEL
BUN/Creatinine Ratio: 20 (ref 12–28)
BUN: 15 mg/dL (ref 8–27)
CO2: 20 mmol/L (ref 20–29)
Calcium: 9.8 mg/dL (ref 8.7–10.3)
Chloride: 97 mmol/L (ref 96–106)
Creatinine, Ser: 0.74 mg/dL (ref 0.57–1.00)
GFR calc Af Amer: 100 mL/min/{1.73_m2} (ref >59)
GFR calc non Af Amer: 87 mL/min/{1.73_m2} (ref >59)
Glucose: 219 mg/dL — ABNORMAL HIGH (ref 65–99)
Potassium: 4.2 mmol/L (ref 3.5–5.2)
Sodium: 138 mmol/L (ref 134–144)

## 2019-07-16 LAB — MICROALBUMIN / CREATININE URINE RATIO
Creatinine, Urine: 21.5 mg/dL
Microalb/Creat Ratio: <14 mg/g creat (ref 0–29)
Microalbumin, Urine: <3 ug/mL

## 2019-07-17 ENCOUNTER — Ambulatory Visit: Payer: Self-pay | Attending: Nurse Practitioner

## 2019-07-17 ENCOUNTER — Other Ambulatory Visit: Payer: Self-pay

## 2019-07-23 MED FILL — LISINOPRIL 20 MG TABLET: 20 | 30 days supply | Qty: 30 | Fill #9

## 2019-07-26 ENCOUNTER — Telehealth: Payer: Self-pay | Admitting: Nurse Practitioner

## 2019-07-26 NOTE — Telephone Encounter (Signed)
Patient called requesting her lab results. Please f/u  

## 2019-07-26 NOTE — Telephone Encounter (Signed)
Spoke to patient and informed on lab results. Pt. Understood and verified DOB.

## 2019-08-21 MED FILL — glipiZIDE XL 5 MG TB24: 5 | 30 days supply | Qty: 30 | Fill #1

## 2019-08-21 MED FILL — metFORMIN HCL 1000 MG TABS: 1000 | 30 days supply | Qty: 60 | Fill #1

## 2019-08-21 MED FILL — LEVOTHYROXINE SODIUM 100 MC: 100 | 30 days supply | Qty: 30 | Fill #1

## 2019-09-17 ENCOUNTER — Other Ambulatory Visit: Payer: Self-pay | Admitting: Nurse Practitioner

## 2019-09-17 DIAGNOSIS — I10 Essential (primary) hypertension: Secondary | ICD-10-CM

## 2019-09-17 MED ORDER — LISINOPRIL 30 MG PO TABS: 30.0000 mg | ORAL_TABLET | Freq: Every day | ORAL | 0 refills | Status: DC

## 2019-09-17 MED FILL — LISINOPRIL 30 MG TABS: 30 | 30 days supply | Qty: 30 | Fill #0

## 2019-09-17 MED FILL — glipiZIDE XL 5 MG TB24: 5 | 30 days supply | Qty: 30 | Fill #2

## 2019-09-17 MED FILL — OMEGA-3 ETHYL ESTERS 1 GM C: 1 | 30 days supply | Qty: 120 | Fill #1

## 2019-09-17 MED FILL — metFORMIN HCL 1000 MG TABS: 1000 | 30 days supply | Qty: 60 | Fill #2

## 2019-09-17 MED FILL — LEVOTHYROXINE SODIUM 100 MC: 100 | 30 days supply | Qty: 30 | Fill #2

## 2019-09-17 NOTE — Telephone Encounter (Signed)
Pt request refill  lisinopril (ZESTRIL) 30 MG tablet [facility] [place], [place] [facility] [place] [address] [employer] Phone:  [phone]  Fax:  [phone]

## 2019-09-24 ENCOUNTER — Other Ambulatory Visit: Payer: Self-pay | Admitting: Nurse Practitioner

## 2019-09-24 DIAGNOSIS — I10 Essential (primary) hypertension: Secondary | ICD-10-CM

## 2019-09-24 DIAGNOSIS — E039 Hypothyroidism, unspecified: Secondary | ICD-10-CM

## 2019-09-24 NOTE — Telephone Encounter (Signed)
Requested medication (s) are due for refill today: yes  Requested medication (s) are on the active medication list:yes  Last refill:  07/15/19  #30  2 refills  Future visit scheduled: yes  Notes to clinic: Medication has end date 08/14/19 Expired Rx    Requested Prescriptions  Pending Prescriptions Disp Refills   levothyroxine (SYNTHROID) 100 MCG tablet [Pharmacy Med Name: LEVOTHYROXINE SODIUM 100 MC 100 Tablet] 30 tablet 2    Sig: TAKE 1 TABLET (100 MCG TOTAL) BY MOUTH DAILY BEFORE BREAKFAST.      Endocrinology:  Hypothyroid Agents Failed - 09/24/2019  5:11 PM      Failed - TSH needs to be rechecked within 3 months after an abnormal result. Refill until TSH is due.      Passed - TSH in normal range and within 360 days    TSH  Date Value Ref Range Status  04/17/2019 3.380 0.450 - 4.500 uIU/mL Final          Passed - Valid encounter within last 12 months    Recent Outpatient Visits           2 months ago Uncontrolled type 2 diabetes mellitus with hyperglycemia, without long-term current use of insulin [facility])   [facility] [place], Shea Stakes, NP   4 months ago HYPERCHOLESTEROLEMIA   [facility] [place], Shea Stakes, NP   5 months ago Dysuria   [facility] [place], Shea Stakes, NP   7 months ago Acquired hypothyroidism   [place] [address] [facility] [place], Shea Stakes, NP   10 months ago Encounter for annual physical exam   [facility] [place], Shea Stakes, NP       Future Appointments             In 3 weeks Claiborne Rigg, NP [place] [employer] [facility]             Refused Prescriptions Disp Refills   lisinopril (ZESTRIL) 20 MG tablet [Pharmacy Med Name: LISINOPRIL 20 MG TABLET 20 Tablet] 30 tablet 3    Sig: TAKE 1 TABLET (20 MG TOTAL) BY MOUTH DAILY.      Cardiovascular:  ACE Inhibitors Failed - 09/24/2019  5:11 PM      Failed - Last  BP in normal range    BP Readings from Last 1 Encounters:  07/15/19 (!) 158/76          Passed - Cr in normal range and within 180 days    Creat  Date Value Ref Range Status  03/01/2016 0.81 0.50 - 1.05 mg/dL Final    Comment:      For patients > or = 62 years of age: The upper reference limit for Creatinine is approximately 13% higher for people identified as African-American.      Creatinine, Ser  Date Value Ref Range Status  07/15/2019 0.74 0.57 - 1.00 mg/dL Final   Creatinine, Urine  Date Value Ref Range Status  03/01/2016 251 20 - 320 mg/dL Final          Passed - K in normal range and within 180 days    Potassium  Date Value Ref Range Status  07/15/2019 4.2 3.5 - 5.2 mmol/L Final          Passed - Patient is not pregnant      Passed - Valid encounter within last 6 months    Recent  Outpatient Visits           2 months ago Uncontrolled type 2 diabetes mellitus with hyperglycemia, without long-term current use of insulin [facility])   [facility] [place], Shea Stakes, NP   4 months ago HYPERCHOLESTEROLEMIA   [facility] [place], Shea Stakes, NP   5 months ago Dysuria   [facility] [place], Shea Stakes, NP   7 months ago Acquired hypothyroidism   [facility] [place], Shea Stakes, NP   10 months ago Encounter for annual physical exam   [facility] [place], Shea Stakes, NP       Future Appointments             In 3 weeks Claiborne Rigg, NP [employer] [facility]

## 2019-09-24 NOTE — Telephone Encounter (Signed)
Requested Prescriptions  Pending Prescriptions Disp Refills  . levothyroxine (SYNTHROID) 100 MCG tablet [Pharmacy Med Name: LEVOTHYROXINE SODIUM 100 MC 100 Tablet] 30 tablet 2    Sig: TAKE 1 TABLET (100 MCG TOTAL) BY MOUTH DAILY BEFORE BREAKFAST.     Endocrinology:  Hypothyroid Agents Failed - 09/24/2019  5:11 PM      Failed - TSH needs to be rechecked within 3 months after an abnormal result. Refill until TSH is due.      Passed - TSH in normal range and within 360 days    TSH  Date Value Ref Range Status  04/17/2019 3.380 0.450 - 4.500 uIU/mL Final         Passed - Valid encounter within last 12 months    Recent Outpatient Visits          2 months ago Uncontrolled type 2 diabetes mellitus with hyperglycemia, without long-term current use of insulin [facility])   [facility] [place], Shea Stakes, NP   4 months ago HYPERCHOLESTEROLEMIA   [name] [place], Shea Stakes, NP   5 months ago Dysuria   [facility] [place], Shea Stakes, NP   7 months ago Acquired hypothyroidism   [facility] [place], Shea Stakes, NP   10 months ago Encounter for annual physical exam   [facility] [place], Shea Stakes, NP      Future Appointments            In 3 weeks Claiborne Rigg, NP [employer] [facility]           Refused Prescriptions Disp Refills  . lisinopril (ZESTRIL) 20 MG tablet [Pharmacy Med Name: LISINOPRIL 20 MG TABLET 20 Tablet] 30 tablet 3    Sig: TAKE 1 TABLET (20 MG TOTAL) BY MOUTH DAILY.     Cardiovascular:  ACE Inhibitors Failed - 09/24/2019  5:11 PM      Failed - Last BP in normal range    BP Readings from Last 1 Encounters:  07/15/19 (!) 158/76         Passed - Cr in normal range and within 180 days    Creat  Date Value Ref Range Status  03/01/2016 0.81 0.50 - 1.05 mg/dL Final    Comment:      For patients > or = 62 years of  age: The upper reference limit for Creatinine is approximately 13% higher for people identified as African-American.      Creatinine, Ser  Date Value Ref Range Status  07/15/2019 0.74 0.57 - 1.00 mg/dL Final   Creatinine, Urine  Date Value Ref Range Status  03/01/2016 251 20 - 320 mg/dL Final         Passed - K in normal range and within 180 days    Potassium  Date Value Ref Range Status  07/15/2019 4.2 3.5 - 5.2 mmol/L Final         Passed - Patient is not pregnant      Passed - Valid encounter within last 6 months    Recent Outpatient Visits          2 months ago Uncontrolled type 2 diabetes mellitus with hyperglycemia, without long-term current use of insulin [facility]   [facility] [place], Shea Stakes, NP   4 months ago HYPERCHOLESTEROLEMIA   [facility] [place], Shea Stakes, NP  5 months ago Dysuria   [facility] [place], Shea Stakes, NP   7 months ago Acquired hypothyroidism   [facility] [place], Shea Stakes, NP   10 months ago Encounter for annual physical exam   [facility] [place], Shea Stakes, NP      Future Appointments            In 3 weeks Claiborne Rigg, NP [facility]           dose is wrong lisinopril is listed as 30 mg on medication list

## 2019-09-25 ENCOUNTER — Other Ambulatory Visit: Payer: Self-pay | Admitting: Family Medicine

## 2019-09-26 ENCOUNTER — Telehealth: Payer: Self-pay | Admitting: Nurse Practitioner

## 2019-09-26 NOTE — Telephone Encounter (Signed)
Patient called saying she used to take 20mg  for the lisinopril and her Rx is for 30mg . Patient states that the 30mg  makes her feel bad. Please f/u

## 2019-09-29 ENCOUNTER — Other Ambulatory Visit: Payer: Self-pay

## 2019-09-29 ENCOUNTER — Encounter (HOSPITAL_COMMUNITY): Payer: Self-pay | Admitting: *Deleted

## 2019-09-29 ENCOUNTER — Emergency Department (HOSPITAL_COMMUNITY)
Admission: EM | Admit: 2019-09-29 | Discharge: 2019-09-29 | Disposition: A | Discharge status: Home / Self Care | Payer: No Typology Code available for payment source | Attending: Emergency Medicine | Admitting: Emergency Medicine

## 2019-09-29 DIAGNOSIS — Z5321 Procedure and treatment not carried out due to patient leaving prior to being seen by health care provider: Secondary | ICD-10-CM | POA: Insufficient documentation

## 2019-09-29 DIAGNOSIS — I10 Essential (primary) hypertension: Secondary | ICD-10-CM | POA: Insufficient documentation

## 2019-09-29 NOTE — ED Triage Notes (Signed)
Pt is here with headache earlier that has now resolved after taking her BP meds.  She states her blood pressure keeps going up and down.

## 2019-09-29 NOTE — ED Notes (Signed)
Called x 3 NO answer 

## 2019-10-01 ENCOUNTER — Other Ambulatory Visit: Payer: Self-pay | Admitting: Nurse Practitioner

## 2019-10-01 DIAGNOSIS — I10 Essential (primary) hypertension: Secondary | ICD-10-CM

## 2019-10-01 MED ORDER — AMLODIPINE BESYLATE 5 MG PO TABS
5.0000 mg | ORAL_TABLET | Freq: Every day | ORAL | 3 refills | Status: DC
Start: 2019-10-01 — End: 2019-10-01

## 2019-10-01 MED ORDER — LISINOPRIL 20 MG PO TABS: 20.0000 mg | ORAL_TABLET | Freq: Every day | ORAL | 0 refills | Status: DC

## 2019-10-01 NOTE — Telephone Encounter (Signed)
Will send lisinopril 20 mg to pharmacy. Will also be starting on amlodipine 5 mg daily. Will need to pick up 2 blood pressure medications from pharmacy

## 2019-10-02 MED FILL — LISINOPRIL 20 MG TABLET: 20 | 30 days supply | Qty: 30 | Fill #0

## 2019-10-02 MED FILL — ?AMLODIPINE BESYLATE 5MG TA: 5 | 30 days supply | Qty: 30 | Fill #0

## 2019-10-04 NOTE — Telephone Encounter (Signed)
Attempt to reach patient to inform on medication. No answer and the VM has not been set up to leave a VM.

## 2019-10-16 ENCOUNTER — Other Ambulatory Visit: Payer: Self-pay

## 2019-10-16 ENCOUNTER — Other Ambulatory Visit: Payer: Self-pay | Admitting: Nurse Practitioner

## 2019-10-16 ENCOUNTER — Ambulatory Visit: Payer: Self-pay | Attending: Nurse Practitioner | Admitting: Nurse Practitioner

## 2019-10-16 DIAGNOSIS — K219 Gastro-esophageal reflux disease without esophagitis: Secondary | ICD-10-CM

## 2019-10-16 DIAGNOSIS — E1165 Type 2 diabetes mellitus with hyperglycemia: Secondary | ICD-10-CM

## 2019-10-16 DIAGNOSIS — E781 Pure hyperglyceridemia: Secondary | ICD-10-CM

## 2019-10-16 DIAGNOSIS — I10 Essential (primary) hypertension: Secondary | ICD-10-CM

## 2019-10-16 MED ORDER — OMEPRAZOLE 20 MG PO CPDR: 20.0000 mg | DELAYED_RELEASE_CAPSULE | Freq: Every day | ORAL | 1 refills | Status: DC

## 2019-10-16 MED ORDER — OMEGA-3-ACID ETHYL ESTERS 1 G PO CAPS: 2.0000 g | ORAL_CAPSULE | Freq: Two times a day (BID) | ORAL | 1 refills | Status: DC

## 2019-10-16 MED FILL — ?OMEPRAZOLE 20 MG CPDR: 20 | 30 days supply | Qty: 30 | Fill #0

## 2019-10-16 MED FILL — AMLODIPINE BESYLATE 5 MG TA: 5 | 30 days supply | Qty: 30 | Fill #0

## 2019-10-16 MED FILL — OMEGA-3 ETHYL ESTERS 1 GM C: 1 | 30 days supply | Qty: 120 | Fill #0

## 2019-10-16 MED FILL — LISINOPRIL 20 MG TABLET: 20 | 30 days supply | Qty: 30 | Fill #0

## 2019-10-16 NOTE — Progress Notes (Signed)
Virtual Visit via Telephone Note Due to national recommendations of social distancing due to COVID 19, telehealth visit is felt to be most appropriate for this patient at this time.  I discussed the limitations, risks, security and privacy concerns of performing an evaluation and management service by telephone and the availability of in person appointments. I also discussed with the patient that there may be a patient responsible charge related to this service. The patient expressed understanding and agreed to proceed.    I connected with Erin Huff on 10/16/19  at   2:10 PM EDT  EDT by telephone and verified that I am speaking with the correct person using two identifiers.   Consent I discussed the limitations, risks, security and privacy concerns of performing an evaluation and management service by telephone and the availability of in person appointments. I also discussed with the patient that there may be a patient responsible charge related to this service. The patient expressed understanding and agreed to proceed.   Location of Patient: Private Residence    [facility] Provider[facility]    Persons participating in Telemedicine visit: [name] FNP-BC [facility] CMA Janiece Huff  Elita Quick Spanish Interpreter #[id number]   History of Present Illness: Telemedicine visit for: F/U  Essential Hypertension Not well controlled. She could not tolerate lisinopril 30mg  (made her feel bad) so I decreased it back to 20 mg and added amlodipine 5 mg. As of today she has not picked up the amlodipine 5 mg. Denies chest pain, shortness of breath, palpitations, lightheadedness, dizziness, headaches or BLE edema.  BP Readings from Last 3 Encounters:  09/29/19 (!) 150/80  07/15/19 (!) 158/76  06/04/19 136/78    Migraines She endorses intermittent mild to moderate severity frontal headaches. Is not taking fioricet that has been prescribed  for her migraines.   UTI symptoms She endorses pain in the left flank. She went to another clinic last week and states she was instructed to take antibiotics which she completed last week. She is concerned she may have a UTI. I am unable to find any report  From an urgent care or ED for her reported visit.  Will check UA.   DM TYPE 2 Not at goal. This morning's blood glucose reading was 150 fasting. She only monitors only once a day. She is not consistently diet or exercise adherent. Taking glipizide 5 mg daily and metformin 1000 mg BID. LDL not at goal. She endorses a statin intolerance. States she did not like the way it made her feel. She is not taking lovaza as prescribed.  Lab Results  Component Value Date   HGBA1C 7.6 (A) 07/15/2019   Lab Results  Component Value Date   LDLCALC 115 (H) 07/15/2019    Hypothyroidism Taking synthroid 100 mcg daily as prescribed. Denies fatigue, palpitations, weight loss, weight gain.  Lab Results  Component Value Date   TSH 3.380 04/17/2019    Past Medical History:  Diagnosis Date  . Allergy   . Depression    mild  . Diabetes mellitus without complication (HCC)   . GERD (gastroesophageal reflux disease)   . Heart murmur   . Hyperlipidemia   . Hypertension   . Thyroid disease     Past Surgical History:  Procedure Laterality Date  . NO PAST SURGERIES      Family History  Problem Relation Age of Onset  . Hypertension Sister   . Diabetes Brother   . Breast cancer Maternal Aunt   .  Colon cancer Neg Hx   . Colon polyps Neg Hx   . Esophageal cancer Neg Hx   . Rectal cancer Neg Hx   . Stomach cancer Neg Hx     Social History   Socioeconomic History  . Marital status: Married    Spouse name: Not on file  . Number of children: 5  . Years of education: Not on file  . Highest education level: 2nd grade  Occupational History  . Not on file  Tobacco Use  . Smoking status: Never Smoker  . Smokeless tobacco: Never Used  Vaping Use   . Vaping Use: Never used  Substance and Sexual Activity  . Alcohol use: No    Comment: rare   . Drug use: No  . Sexual activity: Yes    Birth control/protection: None  Other Topics Concern  . Not on file  Social History Narrative   Sometimes husband is aggressive but she talks back and is aggressive with him. Does not take it.    Social Determinants of Health   Financial Resource Strain:   . Difficulty of Paying Living Expenses: Not on file  Food Insecurity:   . Worried About Programme researcher, broadcasting/film/video in the Last Year: Not on file  . Ran Out of Food in the Last Year: Not on file  Transportation Needs: No Transportation Needs  . Lack of Transportation (Medical): No  . Lack of Transportation (Non-Medical): No  Physical Activity:   . Days of Exercise per Week: Not on file  . Minutes of Exercise per Session: Not on file  Stress:   . Feeling of Stress : Not on file  Social Connections:   . Frequency of Communication with Friends and Family: Not on file  . Frequency of Social Gatherings with Friends and Family: Not on file  . Attends Religious Services: Not on file  . Active Member of Clubs or Organizations: Not on file  . Attends Banker Meetings: Not on file  . Marital Status: Not on file     Observations/Objective: Awake, alert and oriented x 3   Review of Systems  Constitutional: Negative for fever, malaise/fatigue and weight loss.  HENT: Negative.  Negative for nosebleeds.   Eyes: Negative.  Negative for blurred vision, double vision and photophobia.  Respiratory: Negative.  Negative for cough and shortness of breath.   Cardiovascular: Negative.  Negative for chest pain, palpitations and leg swelling.  Gastrointestinal: Positive for heartburn. Negative for nausea and vomiting.  Musculoskeletal: Negative.  Negative for myalgias.  Neurological: Positive for headaches. Negative for dizziness, focal weakness and seizures.  Psychiatric/Behavioral: Negative.   Negative for suicidal ideas.    Assessment and Plan: Brandolyn was seen today for follow-up.  Diagnoses and all orders for this visit:  Essential hypertension Continue all antihypertensives as prescribed.  Remember to bring in your blood pressure log with you for your follow up appointment.  DASH/Mediterranean Diets are healthier choices for HTN.    Hypertriglyceridemia -     omega-3 acid ethyl esters (LOVAZA) 1 g capsule; Take 2 capsules (2 g total) by mouth 2 (two) times daily. INSTRUCTIONS: Work on a low fat, heart healthy diet and participate in regular aerobic exercise program by working out at least 150 minutes per week; 5 days a week-30 minutes per day. Avoid red meat/beef/steak,  fried foods. junk foods, sodas, sugary drinks, unhealthy snacking, alcohol and smoking.  Drink at least 80 oz of water per day and monitor your carbohydrate intake  daily.    Gastroesophageal reflux disease without esophagitis -     omeprazole (PRILOSEC) 20 MG capsule; Take 1 capsule (20 mg total) by mouth daily. INSTRUCTIONS: Avoid GERD Triggers: acidic, spicy or fried foods, caffeine, coffee, sodas,  alcohol and chocolate.   Uncontrolled type 2 diabetes mellitus with hyperglycemia, without long-term current use of insulin (HCC) Continue blood sugar control as discussed in office today, low carbohydrate diet, and regular physical exercise as tolerated, 150 minutes per week (30 min each day, 5 days per week, or 50 min 3 days per week). Keep blood sugar logs with fasting goal of 90-130 mg/dl, post prandial (after you eat) less than 180.  For Hypoglycemia: BS <60 and Hyperglycemia BS >400; contact the clinic ASAP. Annual eye exams and foot exams are recommended.   Follow Up Instructions Return in about 3 months (around 01/16/2020).     I discussed the assessment and treatment plan with the patient. The patient was provided an opportunity to ask questions and all were answered. The patient agreed with the  plan and demonstrated an understanding of the instructions.   The patient was advised to call back or seek an in-person evaluation if the symptoms worsen or if the condition fails to improve as anticipated.  I provided 19 minutes of non-face-to-face time during this encounter including median intraservice time, reviewing previous notes, labs, imaging, medications and explaining diagnosis and management.  Claiborne Rigg, FNP-BC

## 2019-10-21 ENCOUNTER — Other Ambulatory Visit: Payer: Self-pay | Admitting: Nurse Practitioner

## 2019-10-21 ENCOUNTER — Encounter: Payer: Self-pay | Admitting: Nurse Practitioner

## 2019-10-21 ENCOUNTER — Ambulatory Visit: Payer: No Typology Code available for payment source | Attending: Family Medicine

## 2019-10-21 ENCOUNTER — Other Ambulatory Visit: Payer: Self-pay

## 2019-10-21 DIAGNOSIS — E7800 Pure hypercholesterolemia, unspecified: Secondary | ICD-10-CM

## 2019-10-21 DIAGNOSIS — E039 Hypothyroidism, unspecified: Secondary | ICD-10-CM

## 2019-10-21 DIAGNOSIS — E1165 Type 2 diabetes mellitus with hyperglycemia: Secondary | ICD-10-CM

## 2019-10-21 DIAGNOSIS — I10 Essential (primary) hypertension: Secondary | ICD-10-CM

## 2019-10-21 MED FILL — metFORMIN HCL 1000 MG TABS: 1000 | 30 days supply | Qty: 60 | Fill #3

## 2019-10-21 MED FILL — TRUE METRIX TEST STRIP: 30 days supply | Qty: 100 | Fill #1

## 2019-10-22 LAB — LIPID PANEL
Chol/HDL Ratio: 3.1 ratio (ref 0.0–4.4)
Cholesterol, Total: 168 mg/dL (ref 100–199)
HDL: 55 mg/dL (ref >39)
LDL Chol Calc (NIH): 86 mg/dL (ref 0–99)
Triglycerides: 154 mg/dL — ABNORMAL HIGH (ref 0–149)
VLDL Cholesterol Cal: 27 mg/dL (ref 5–40)

## 2019-10-22 LAB — BASIC METABOLIC PANEL
BUN/Creatinine Ratio: 14 (ref 12–28)
BUN: 10 mg/dL (ref 8–27)
CO2: 22 mmol/L (ref 20–29)
Calcium: 9.4 mg/dL (ref 8.7–10.3)
Chloride: 97 mmol/L (ref 96–106)
Creatinine, Ser: 0.69 mg/dL (ref 0.57–1.00)
GFR calc Af Amer: 108 mL/min/{1.73_m2} (ref >59)
GFR calc non Af Amer: 94 mL/min/{1.73_m2} (ref >59)
Glucose: 292 mg/dL — ABNORMAL HIGH (ref 65–99)
Potassium: 4.4 mmol/L (ref 3.5–5.2)
Sodium: 135 mmol/L (ref 134–144)

## 2019-10-22 LAB — HEMOGLOBIN A1C
Est. average glucose Bld gHb Est-mCnc: 180 mg/dL
Hgb A1c MFr Bld: 7.9 % — ABNORMAL HIGH (ref 4.8–5.6)

## 2019-10-22 LAB — TSH: TSH: 3.24 u[IU]/mL (ref 0.450–4.500)

## 2019-10-25 ENCOUNTER — Other Ambulatory Visit: Payer: Self-pay | Admitting: Nurse Practitioner

## 2019-10-25 DIAGNOSIS — E1165 Type 2 diabetes mellitus with hyperglycemia: Secondary | ICD-10-CM

## 2019-10-25 MED ORDER — GLIPIZIDE ER 10 MG PO TB24: 10.0000 mg | ORAL_TABLET | Freq: Every day | ORAL | 1 refills | Status: DC

## 2019-10-25 MED FILL — glipiZIDE XL 10 MG TB24: 10 | 30 days supply | Qty: 30 | Fill #0

## 2019-10-30 MED FILL — glipiZIDE XL 5 MG TB24: 5 | 30 days supply | Qty: 30 | Fill #3

## 2019-11-01 ENCOUNTER — Telehealth: Payer: Self-pay

## 2019-11-01 MED FILL — LEVOTHYROXINE SODIUM 100 MC: 100 | 30 days supply | Qty: 30 | Fill #0

## 2019-11-01 NOTE — Telephone Encounter (Signed)
Spoke to patient and informed on results.  °

## 2019-11-01 NOTE — Telephone Encounter (Signed)
Copied from CRM [id number]. Topic: General - Other >> Oct 31, 2019 11:08 AM Jaquita Rector A wrote: Reason for CRM: patient would like a call back with lab results please. Need someone that speak spanish to call back Ph# [phone]

## 2019-11-04 MED FILL — glipiZIDE XL 10 MG TB24: 10 | 30 days supply | Qty: 30 | Fill #0

## 2019-11-13 ENCOUNTER — Other Ambulatory Visit: Payer: Self-pay | Admitting: Nurse Practitioner

## 2019-11-13 DIAGNOSIS — E1165 Type 2 diabetes mellitus with hyperglycemia: Secondary | ICD-10-CM

## 2019-11-13 MED ORDER — METFORMIN HCL 1000 MG PO TABS: 1000.0000 mg | ORAL_TABLET | Freq: Two times a day (BID) | ORAL | 0 refills | Status: DC

## 2019-11-13 NOTE — Telephone Encounter (Signed)
metFORMIN (GLUCOPHAGE) 1000 MG tablet Medication Date: 07/15/2019 Department: [facility] Ordering/Authorizing: Claiborne Rigg, NP   [facility] - [place], [place] - [place] [address]  [address] [place] [address]  Phone: [phone] Fax: [phone]  Hours: Not open 24 hours

## 2019-11-14 MED FILL — AMLODIPINE BESYLATE 5 MG TA: 5 | 30 days supply | Qty: 30 | Fill #1

## 2019-11-14 MED FILL — LISINOPRIL 20 MG TABLET: 20 | 30 days supply | Qty: 30 | Fill #1

## 2019-11-15 MED FILL — METFORMIN HCL 1000 MG TABS: 1000 | 30 days supply | Qty: 60 | Fill #0

## 2019-12-06 ENCOUNTER — Telehealth: Payer: Self-pay | Admitting: Nurse Practitioner

## 2019-12-06 DIAGNOSIS — E1165 Type 2 diabetes mellitus with hyperglycemia: Secondary | ICD-10-CM

## 2019-12-06 DIAGNOSIS — I10 Essential (primary) hypertension: Secondary | ICD-10-CM

## 2019-12-06 MED FILL — glipiZIDE XL 10 MG TB24: 10 | 30 days supply | Qty: 30 | Fill #1

## 2019-12-06 MED FILL — LEVOTHYROXINE SODIUM 100 MC: 100 | 30 days supply | Qty: 30 | Fill #1

## 2019-12-06 MED FILL — LISINOPRIL 20 MG TABLET: 20 | 30 days supply | Qty: 30 | Fill #2

## 2019-12-06 NOTE — Telephone Encounter (Signed)
Refills available in our pharmacy. I have placed these for her.

## 2019-12-06 NOTE — Telephone Encounter (Signed)
Medication Refill - Medication: lisinopril (ZESTRIL) 20 MG tablet  levothyroxine (SYNTHROID) 100 MCG tablet glipiZIDE (GLIPIZIDE XL) 10 MG 24 hr tablet   Has the patient contacted their pharmacy? Yes.   (Agent: If no, request that the patient contact the pharmacy for the refill.) (Agent: If yes, when and what did the pharmacy advise?)  Preferred Pharmacy (with phone number or street name):  [facility] - [place], [place] - [place] [address]  [address] [place] [address]  Phone: [phone] Fax: [phone]     Agent: Please be advised that RX refills may take up to 3 business days. We ask that you follow-up with your pharmacy.

## 2019-12-24 ENCOUNTER — Telehealth: Payer: Self-pay | Admitting: Nurse Practitioner

## 2019-12-24 MED FILL — OMEGA-3 ETHYL ESTERS 1 GM C: 1 | 30 days supply | Qty: 120 | Fill #1

## 2019-12-24 MED FILL — METFORMIN HCL 1000 MG TABS: 1000 | 30 days supply | Qty: 60 | Fill #1

## 2019-12-24 NOTE — Telephone Encounter (Signed)
Medication Refill - Medication: metformin, omega 3   Has the patient contacted their pharmacy? Yes.   (Agent: If no, request that the patient contact the pharmacy for the refill.) (Agent: If yes, when and what did the pharmacy advise?)  Preferred Pharmacy (with phone number or street name):  [facility] - [place], [place] - [place] [address]  [address] [place] [address]  Phone: [phone] Fax: [phone]  Hours: Not open 24 hours     Agent: Please be advised that RX refills may take up to 3 business days. We ask that you follow-up with your pharmacy.

## 2019-12-24 NOTE — Telephone Encounter (Signed)
Patient has refills in the pharmacy. I have placed these for her.

## 2020-01-02 ENCOUNTER — Other Ambulatory Visit: Payer: Self-pay | Admitting: Family Medicine

## 2020-01-02 ENCOUNTER — Other Ambulatory Visit: Payer: Self-pay | Admitting: Nurse Practitioner

## 2020-01-02 DIAGNOSIS — E1165 Type 2 diabetes mellitus with hyperglycemia: Secondary | ICD-10-CM

## 2020-01-02 DIAGNOSIS — E039 Hypothyroidism, unspecified: Secondary | ICD-10-CM

## 2020-01-02 DIAGNOSIS — I10 Essential (primary) hypertension: Secondary | ICD-10-CM

## 2020-01-02 MED ORDER — LISINOPRIL 20 MG PO TABS: 20.0000 mg | ORAL_TABLET | Freq: Every day | ORAL | 0 refills | Status: DC

## 2020-01-02 MED ORDER — GLIPIZIDE ER 10 MG PO TB24: 10.0000 mg | ORAL_TABLET | Freq: Every day | ORAL | 0 refills | Status: DC

## 2020-01-02 MED ORDER — LEVOTHYROXINE SODIUM 100 MCG PO TABS: ORAL_TABLET | ORAL | 2 refills | Status: DC

## 2020-01-02 NOTE — Telephone Encounter (Signed)
Medication Refill - Medication: levothyroxine, lisinopril, glipizide   Has the patient contacted their pharmacy? Yes.  Pt states that she is out of these medications. Please advise.  (Agent: If no, request that the patient contact the pharmacy for the refill.) (Agent: If yes, when and what did the pharmacy advise?)  Preferred Pharmacy (with phone number or street name):  [facility] - [place], [place] - [place] [address]  [address] [place] [address]  Phone: [phone] Fax: [phone]  Hours: Not open 24 hours     Agent: Please be advised that RX refills may take up to 3 business days. We ask that you follow-up with your pharmacy.

## 2020-01-06 MED FILL — glipiZIDE XL 10 MG TB24: 10 | 30 days supply | Qty: 30 | Fill #0

## 2020-01-06 MED FILL — LEVOTHYROXINE SODIUM 100 MC: 100 | 30 days supply | Qty: 30 | Fill #0

## 2020-01-06 MED FILL — LISINOPRIL 20 MG TABLET: 20 | 30 days supply | Qty: 30 | Fill #0

## 2020-01-20 ENCOUNTER — Other Ambulatory Visit: Payer: Self-pay | Admitting: Nurse Practitioner

## 2020-01-20 DIAGNOSIS — E1165 Type 2 diabetes mellitus with hyperglycemia: Secondary | ICD-10-CM

## 2020-01-20 MED ORDER — METFORMIN HCL 1000 MG PO TABS: 1000.0000 mg | ORAL_TABLET | Freq: Two times a day (BID) | ORAL | 0 refills | Status: DC

## 2020-01-20 MED ORDER — TRUE METRIX BLOOD GLUCOSE TEST VI STRP: ORAL_STRIP | 12 refills | Status: DC

## 2020-01-20 NOTE — Telephone Encounter (Signed)
Medication Refill - Medication: metFORMIN (GLUCOPHAGE) 1000 MG tablet glucose blood (TRUE METRIX BLOOD GLUCOSE TEST) test strip     Preferred Pharmacy (with phone number or street name):  [facility] - [place], [place] - [place] [address] Phone:  [phone]  Fax:  [phone]       Agent: Please be advised that RX refills may take up to 3 business days. We ask that you follow-up with your pharmacy.

## 2020-01-21 ENCOUNTER — Ambulatory Visit: Payer: Self-pay | Attending: Nurse Practitioner | Admitting: Nurse Practitioner

## 2020-01-21 ENCOUNTER — Other Ambulatory Visit: Payer: Self-pay

## 2020-01-21 ENCOUNTER — Encounter: Payer: Self-pay | Admitting: Nurse Practitioner

## 2020-01-21 VITALS — BP 152/82 | HR 78 | Temp 96.9°F | Ht 60.0 in | Wt 169.0 lb

## 2020-01-21 DIAGNOSIS — E1165 Type 2 diabetes mellitus with hyperglycemia: Secondary | ICD-10-CM

## 2020-01-21 DIAGNOSIS — E781 Pure hyperglyceridemia: Secondary | ICD-10-CM

## 2020-01-21 DIAGNOSIS — Z1211 Encounter for screening for malignant neoplasm of colon: Secondary | ICD-10-CM

## 2020-01-21 DIAGNOSIS — K219 Gastro-esophageal reflux disease without esophagitis: Secondary | ICD-10-CM

## 2020-01-21 DIAGNOSIS — I10 Essential (primary) hypertension: Secondary | ICD-10-CM

## 2020-01-21 LAB — POCT GLYCOSYLATED HEMOGLOBIN (HGB A1C): Hemoglobin A1C: 7.7 % — AB (ref 4.0–5.6)

## 2020-01-21 LAB — GLUCOSE, POCT (MANUAL RESULT ENTRY): POC Glucose: 178 mg/dl — AB (ref 70–99)

## 2020-01-21 MED ORDER — CEPACOL SORE THROAT 5.4 MG MT LOZG: LOZENGE | OROMUCOSAL | 0 refills | Status: DC

## 2020-01-21 MED ORDER — OMEPRAZOLE 20 MG PO CPDR: 20.0000 mg | DELAYED_RELEASE_CAPSULE | Freq: Every day | ORAL | 1 refills | Status: DC

## 2020-01-21 MED FILL — TRUE METRIX TEST STRIP: 25 days supply | Qty: 100 | Fill #0

## 2020-01-21 MED FILL — ?OMEPRAZOLE 20 MG CPDR: 20 | 30 days supply | Qty: 30 | Fill #0

## 2020-01-21 MED FILL — METFORMIN HCL 1000 MG TABS: 1000 | 30 days supply | Qty: 60 | Fill #0

## 2020-01-21 NOTE — Progress Notes (Signed)
Assessment & Plan:  Erin Huff was seen today for follow-up.  Diagnoses and all orders for this visit:  Uncontrolled type 2 diabetes mellitus with hyperglycemia, without long-term current use of insulin (HCC) -     Glucose (CBG) -     HgB A1c -     Microalbumin/Creatinine Ratio, Urine -     CMP14+EGFR; Future Continue blood sugar control as discussed in office today, low carbohydrate diet, and regular physical exercise as tolerated, 150 minutes per week (30 min each day, 5 days per week, or 50 min 3 days per week). Keep blood sugar logs with fasting goal of 90-130 mg/dl, post prandial (after you eat) less than 180.  For Hypoglycemia: BS <60 and Hyperglycemia BS >400; contact the clinic ASAP. Annual eye exams and foot exams are recommended.   Colon cancer screening -     Fecal occult blood, imunochemical([facility])  Gastroesophageal reflux disease without esophagitis -     Menthol (CEPACOL SORE THROAT) 5.4 MG LOZG; Take daily by mouth as needed -     omeprazole (PRILOSEC) 20 MG capsule; Take 1 capsule (20 mg total) by mouth daily. FOR ACID REFLUX INSTRUCTIONS: Avoid GERD Triggers: acidic, spicy or fried foods, caffeine, coffee, sodas,  alcohol and chocolate.   Essential hypertension -     CMP14+EGFR; Future Continue all antihypertensives as prescribed.  Remember to bring in your blood pressure log with you for your follow up appointment.  DASH/Mediterranean Diets are healthier choices for HTN.     Patient has been counseled on age-appropriate routine health concerns for screening and prevention. These are reviewed and up-to-date. Referrals have been placed accordingly. Immunizations are up-to-date or declined.    Subjective:   Chief Complaint  Patient presents with   Follow-up    Pt. is here for diabetes follow up.    HPI Erin Huff 64 y.o. female presents to office today for follow up. VRI was used to communicate directly with patient for the entire  encounter including providing detailed patient instructions.    DM 2 Not at goal. She endorses medication compliance taking metformin 1000 mg BID, glipizide XL 10 mg daily.  LDL not at goal. She declines statin. Currently taking lovaza 2 gm  BID as prescribed.  Lab Results  Component Value Date   HGBA1C 7.7 (A) 01/21/2020   Lab Results  Component Value Date   HGBA1C 7.9 (H) 10/21/2019   Lab Results  Component Value Date   LDLCALC 86 10/21/2019   GERD She had loose stools last week which have resolved. Since Sunday she has been experiencing what she describes as left sided stomach inflammation and burning in her throat. She is no longer taking omeprazole 20 mg daily. She stopped taking it almost 2 months ago. Will resume to see if this helps with her symptoms.   Hypothyroidism Well controlled. She is taking levothyroxine 100 mg daily as prescribed. She does endorse symptoms of fatigue today.  Lab Results  Component Value Date   TSH 3.240 10/21/2019    Essential Hypertension Not well controlled. She is taking lisinopril 20 mg daily as prescribed however she has not been taking the amlodipine 5 mg that was prescribed for her several months ago. States she has not been taking it because she did not know what it was for. Denies chest pain, shortness of breath, palpitations, lightheadedness, dizziness, headaches or BLE edema.  BP Readings from Last 3 Encounters:  01/21/20 (!) 152/82  09/29/19 (!) 150/80  07/15/19 (!) 158/76  Review of Systems  Constitutional: Negative for fever, malaise/fatigue and weight loss.  HENT: Positive for sore throat. Negative for nosebleeds.   Eyes: Negative.  Negative for blurred vision, double vision and photophobia.  Respiratory: Negative.  Negative for cough, shortness of breath and wheezing.   Cardiovascular: Negative.  Negative for chest pain, palpitations and leg swelling.  Gastrointestinal: Positive for abdominal pain and heartburn. Negative for  blood in stool, constipation, diarrhea, melena, nausea and vomiting.  Musculoskeletal: Negative.  Negative for myalgias.  Neurological: Negative.  Negative for dizziness, focal weakness, seizures and headaches.  Psychiatric/Behavioral: Negative.  Negative for suicidal ideas.    Past Medical History:  Diagnosis Date   Allergy    Depression    mild   Diabetes mellitus without complication (HCC)    GERD (gastroesophageal reflux disease)    Heart murmur    Hyperlipidemia    Hypertension    Thyroid disease     Past Surgical History:  Procedure Laterality Date   NO PAST SURGERIES      Family History  Problem Relation Age of Onset   Hypertension Sister    Diabetes Brother    Breast cancer Maternal Aunt    Colon cancer Neg Hx    Colon polyps Neg Hx    Esophageal cancer Neg Hx    Rectal cancer Neg Hx    Stomach cancer Neg Hx     Social History Reviewed with no changes to be made today.   Outpatient Medications Prior to Visit  Medication Sig Dispense Refill   amLODipine (NORVASC) 5 MG tablet Take 1 tablet (5 mg total) by mouth daily. 90 tablet 3   Blood Glucose Monitoring Suppl (TRUE METRIX METER) w/Device KIT Use as directed 1 kit 0   glipiZIDE (GLIPIZIDE XL) 10 MG 24 hr tablet Take 1 tablet (10 mg total) by mouth daily with breakfast. 90 tablet 0   glucose blood (TRUE METRIX BLOOD GLUCOSE TEST) test strip Use as instructed. 100 each 12   levothyroxine (SYNTHROID) 100 MCG tablet TAKE 1 TABLET (100 MCG TOTAL) BY MOUTH DAILY BEFORE BREAKFAST. 30 tablet 2   lisinopril (ZESTRIL) 20 MG tablet Take 1 tablet (20 mg total) by mouth daily. 90 tablet 0   loratadine (CLARITIN) 10 MG tablet Take 1 tablet (10 mg total) by mouth daily. 30 tablet 11   metFORMIN (GLUCOPHAGE) 1000 MG tablet Take 1 tablet (1,000 mg total) by mouth 2 (two) times daily with a meal. Patient will pick up scripts Monday. 180 tablet 0   Multiple Vitamin (MULTIVITAMIN) tablet Take 1 tablet by  mouth daily.     OVER THE COUNTER MEDICATION nutrilite carb blocker as needed- helps with constipation     TRUEplus Lancets 28G MISC Use as directed 100 each 5   omega-3 acid ethyl esters (LOVAZA) 1 g capsule Take 2 capsules (2 g total) by mouth 2 (two) times daily. 360 capsule 1   omeprazole (PRILOSEC) 20 MG capsule Take 1 capsule (20 mg total) by mouth daily. 90 capsule 1   Facility-Administered Medications Prior to Visit  Medication Dose Route Frequency Provider Last Rate Last Admin   0.9 %  sodium chloride infusion  500 mL Intravenous Continuous Pyrtle, Lajuan Lines, MD        Allergies  Allergen Reactions   Motrin [Ibuprofen] Rash   Penicillins Rash       Objective:    BP (!) 152/82 (BP Location: Left Arm, Patient Position: Sitting, Cuff Size: Normal)    Pulse 78  Temp (!) 96.9 F (36.1 C) (Temporal)    Ht 5' (1.524 m)    Wt 169 lb (76.7 kg)    SpO2 97%    BMI 33.01 kg/m  Wt Readings from Last 3 Encounters:  01/21/20 169 lb (76.7 kg)  07/15/19 173 lb 3.2 oz (78.6 kg)  06/04/19 173 lb (78.5 kg)    Physical Exam Vitals and nursing note reviewed.  Constitutional:      Appearance: She is well-developed.  HENT:     Head: Normocephalic and atraumatic.  Cardiovascular:     Rate and Rhythm: Normal rate and regular rhythm.     Heart sounds: Normal heart sounds. No murmur heard.  No friction rub. No gallop.   Pulmonary:     Effort: Pulmonary effort is normal. No tachypnea or respiratory distress.     Breath sounds: Normal breath sounds. No decreased breath sounds, wheezing, rhonchi or rales.  Chest:     Chest wall: No tenderness.  Abdominal:     General: Bowel sounds are normal.     Palpations: Abdomen is soft.  Musculoskeletal:        General: Normal range of motion.     Cervical back: Normal range of motion.  Skin:    General: Skin is warm and dry.  Neurological:     Mental Status: She is alert and oriented to person, place, and time.     Coordination:  Coordination normal.  Psychiatric:        Behavior: Behavior normal. Behavior is cooperative.        Thought Content: Thought content normal.        Judgment: Judgment normal.          Patient has been counseled extensively about nutrition and exercise as well as the importance of adherence with medications and regular follow-up. The patient was given clear instructions to go to ER or return to medical center if symptoms don't improve, worsen or new problems develop. The patient verbalized understanding.   Follow-up: Return in about 3 months (around 04/20/2020).   Gildardo Pounds, FNP-BC [facility], [place]   01/21/2020, 2:39 PM

## 2020-01-22 LAB — CMP14+EGFR
ALT: 41 IU/L — ABNORMAL HIGH (ref 0–32)
AST: 46 IU/L — ABNORMAL HIGH (ref 0–40)
Albumin/Globulin Ratio: 1.6 (ref 1.2–2.2)
Albumin: 4.7 g/dL (ref 3.8–4.8)
Alkaline Phosphatase: 108 IU/L (ref 44–121)
BUN/Creatinine Ratio: 18 (ref 12–28)
BUN: 12 mg/dL (ref 8–27)
Bilirubin Total: 0.4 mg/dL (ref 0.0–1.2)
CO2: 24 mmol/L (ref 20–29)
Calcium: 9.9 mg/dL (ref 8.7–10.3)
Chloride: 99 mmol/L (ref 96–106)
Creatinine, Ser: 0.67 mg/dL (ref 0.57–1.00)
GFR calc Af Amer: 109 mL/min/{1.73_m2} (ref >59)
GFR calc non Af Amer: 95 mL/min/{1.73_m2} (ref >59)
Globulin, Total: 3 g/dL (ref 1.5–4.5)
Glucose: 131 mg/dL — ABNORMAL HIGH (ref 65–99)
Potassium: 4.8 mmol/L (ref 3.5–5.2)
Sodium: 136 mmol/L (ref 134–144)
Total Protein: 7.7 g/dL (ref 6.0–8.5)

## 2020-01-22 LAB — MICROALBUMIN / CREATININE URINE RATIO
Creatinine, Urine: 21.2 mg/dL
Microalb/Creat Ratio: <14 mg/g creat (ref 0–29)
Microalbumin, Urine: <3 ug/mL

## 2020-01-29 LAB — FECAL OCCULT BLOOD, IMMUNOCHEMICAL: Fecal Occult Bld: NEGATIVE

## 2020-01-30 ENCOUNTER — Telehealth: Payer: Self-pay | Admitting: Nurse Practitioner

## 2020-01-30 ENCOUNTER — Telehealth: Payer: Self-pay | Admitting: *Deleted

## 2020-01-30 NOTE — Telephone Encounter (Signed)
Attempt to call patient to inform on results. No answer and unable to LVM no mailbox setup.  Will route the results to [facility] nurse pool.

## 2020-01-30 NOTE — Telephone Encounter (Signed)
Reviewed lab results and provider's note with the patient via interpreter [phone]. Answered all questions at this time.

## 2020-01-30 NOTE — Telephone Encounter (Signed)
Pt call to request lab result please call back in spanish

## 2020-01-31 MED FILL — glipiZIDE XL 10 MG TB24: 10 | 30 days supply | Qty: 30 | Fill #1

## 2020-01-31 MED FILL — LISINOPRIL 20 MG TABLET: 20 | 30 days supply | Qty: 30 | Fill #1

## 2020-01-31 MED FILL — LEVOTHYROXINE SODIUM 100 MC: 100 | 30 days supply | Qty: 30 | Fill #1

## 2020-02-05 ENCOUNTER — Other Ambulatory Visit: Payer: Self-pay

## 2020-02-05 ENCOUNTER — Ambulatory Visit: Payer: No Typology Code available for payment source | Attending: Nurse Practitioner

## 2020-02-19 MED FILL — METFORMIN HCL 1000 MG TABS: 1000 | 30 days supply | Qty: 60 | Fill #1

## 2020-02-19 MED FILL — AMLODIPINE BESYLATE 5 MG TA: 5 | 30 days supply | Qty: 30 | Fill #2

## 2020-02-28 ENCOUNTER — Other Ambulatory Visit: Payer: Self-pay | Admitting: Pharmacist

## 2020-02-28 DIAGNOSIS — Z1231 Encounter for screening mammogram for malignant neoplasm of breast: Secondary | ICD-10-CM

## 2020-03-03 MED FILL — LEVOTHYROXINE SODIUM 100 MC: 100 | 30 days supply | Qty: 30 | Fill #2

## 2020-03-03 MED FILL — glipiZIDE XL 10 MG TB24: 10 | 30 days supply | Qty: 30 | Fill #2

## 2020-03-03 MED FILL — LISINOPRIL 20 MG TABLET: 20 | 30 days supply | Qty: 30 | Fill #2

## 2020-03-18 MED FILL — METFORMIN HCL 1000 MG TABS: 1000 | 30 days supply | Qty: 60 | Fill #2

## 2020-03-24 ENCOUNTER — Ambulatory Visit
Admission: RE | Admit: 2020-03-24 | Discharge: 2020-03-24 | Disposition: A | Discharge status: Home / Self Care | Payer: No Typology Code available for payment source | Source: Ambulatory Visit | Attending: Pharmacist | Admitting: Pharmacist

## 2020-03-24 ENCOUNTER — Other Ambulatory Visit: Payer: Self-pay

## 2020-03-24 DIAGNOSIS — Z1231 Encounter for screening mammogram for malignant neoplasm of breast: Secondary | ICD-10-CM

## 2020-03-31 ENCOUNTER — Other Ambulatory Visit: Payer: Self-pay | Admitting: Pharmacist

## 2020-03-31 ENCOUNTER — Telehealth: Payer: Self-pay | Admitting: Family Medicine

## 2020-03-31 DIAGNOSIS — I10 Essential (primary) hypertension: Secondary | ICD-10-CM

## 2020-03-31 DIAGNOSIS — E1165 Type 2 diabetes mellitus with hyperglycemia: Secondary | ICD-10-CM

## 2020-03-31 DIAGNOSIS — E039 Hypothyroidism, unspecified: Secondary | ICD-10-CM

## 2020-03-31 MED ORDER — GLIPIZIDE ER 10 MG PO TB24: 10.0000 mg | ORAL_TABLET | Freq: Every day | ORAL | 2 refills | Status: DC

## 2020-03-31 MED ORDER — LEVOTHYROXINE SODIUM 100 MCG PO TABS: ORAL_TABLET | ORAL | 2 refills | Status: DC

## 2020-03-31 MED FILL — LEVOTHYROXINE SODIUM 100 MC: 100 | 30 days supply | Qty: 30 | Fill #2

## 2020-03-31 MED FILL — LISINOPRIL 20 MG TABLET: 20 | 30 days supply | Qty: 30 | Fill #0

## 2020-03-31 MED FILL — glipiZIDE XL 10 MG TB24: 10 | 30 days supply | Qty: 30 | Fill #2

## 2020-03-31 NOTE — Telephone Encounter (Signed)
Pt called to report that the patient is completely out of her current supply levothyroxine (SYNTHROID) 100 MCG tablet glipiZIDE (GLIPIZIDE XL) 10 MG 24 hr tablet   Pt is also requesting these two as well [facility] - [place], [place] - [place] [address]  [address] [place] [address]  Phone: [phone] Fax: [phone]

## 2020-03-31 NOTE — Telephone Encounter (Signed)
Rxs sent

## 2020-04-21 MED FILL — AMLODIPINE BESYLATE 5 MG TA: 5 | 30 days supply | Qty: 30 | Fill #3

## 2020-04-21 MED FILL — METFORMIN HCL 1000 MG TABS: 1000 | 30 days supply | Qty: 60 | Fill #2

## 2020-04-22 ENCOUNTER — Telehealth: Payer: Self-pay | Admitting: Nurse Practitioner

## 2020-04-22 NOTE — Telephone Encounter (Signed)
Copied from CRM [id number]. Topic: General - Inquiry >> Apr 22, 2020 11:32 AM Aretta Nip wrote: Reason for CRM: Pt (Spanish) is calling to check on the status of her card, states she applied in December and wants a fu call Mikle Bosworth [phone]

## 2020-04-22 NOTE — Telephone Encounter (Signed)
I return Pt call, was inform that she was approve for 75% discount for the [employer] program

## 2020-04-24 ENCOUNTER — Ambulatory Visit: Payer: No Typology Code available for payment source | Admitting: Pharmacist

## 2020-04-30 MED FILL — LEVOTHYROXINE SODIUM 100 MC: 100 | 30 days supply | Qty: 30 | Fill #0

## 2020-04-30 MED FILL — glipiZIDE XL 10 MG TB24: 10 | 30 days supply | Qty: 30 | Fill #3

## 2020-04-30 MED FILL — LISINOPRIL 20 MG TABLET: 20 | 30 days supply | Qty: 30 | Fill #1

## 2020-05-20 ENCOUNTER — Ambulatory Visit: Payer: No Typology Code available for payment source | Admitting: Pharmacist

## 2020-05-20 ENCOUNTER — Ambulatory Visit: Payer: No Typology Code available for payment source | Admitting: Nurse Practitioner

## 2020-05-23 ENCOUNTER — Other Ambulatory Visit: Payer: Self-pay

## 2020-05-24 ENCOUNTER — Other Ambulatory Visit: Payer: Self-pay

## 2020-05-24 MED ORDER — OMEPRAZOLE 20 MG PO CPDR: 20.0000 mg | DELAYED_RELEASE_CAPSULE | Freq: Every day | ORAL | 1 refills | Status: DC

## 2020-05-25 ENCOUNTER — Ambulatory Visit: Payer: No Typology Code available for payment source

## 2020-06-01 ENCOUNTER — Telehealth: Payer: Self-pay

## 2020-06-01 DIAGNOSIS — E039 Hypothyroidism, unspecified: Secondary | ICD-10-CM

## 2020-06-01 NOTE — Telephone Encounter (Signed)
Patient called to make a medication refill for   metFORMIN (GLUCOPHAGE) 1000 MG tablet   lisinopril (ZESTRIL) 20 MG tablet   levothyroxine (SYNTHROID) 100 MCG tablet   Patient uses   [facility]   Please advice [phone]

## 2020-06-02 ENCOUNTER — Other Ambulatory Visit: Payer: Self-pay

## 2020-06-02 MED ORDER — LEVOTHYROXINE SODIUM 100 MCG PO TABS
ORAL_TABLET | ORAL | 2 refills | Status: DC
  Filled 2020-06-02: qty 30, 30d supply, fill #0
  Filled 2020-07-06: qty 30, 30d supply, fill #1

## 2020-06-02 NOTE — Addendum Note (Signed)
Addended by: Guy Franco on: 06/02/2020 09:55 AM   Modules accepted: Orders

## 2020-06-02 NOTE — Telephone Encounter (Signed)
Rx for Metformin and Lisinopril denied.  Patient need f/u OV.  Sent Rx for levothyoroxine.

## 2020-06-03 ENCOUNTER — Other Ambulatory Visit: Payer: Self-pay

## 2020-06-03 DIAGNOSIS — E1165 Type 2 diabetes mellitus with hyperglycemia: Secondary | ICD-10-CM

## 2020-06-03 DIAGNOSIS — E039 Hypothyroidism, unspecified: Secondary | ICD-10-CM

## 2020-06-03 DIAGNOSIS — I10 Essential (primary) hypertension: Secondary | ICD-10-CM

## 2020-06-03 MED ORDER — GLIPIZIDE ER 10 MG PO TB24
ORAL_TABLET | Freq: Every day | ORAL | 0 refills | Status: DC
  Filled 2020-06-03: qty 30, 30d supply, fill #0
  Filled 2020-07-27: qty 30, 30d supply, fill #1

## 2020-06-03 MED ORDER — METFORMIN HCL 1000 MG PO TABS
ORAL_TABLET | Freq: Two times a day (BID) | ORAL | 0 refills | Status: DC
  Filled 2020-06-03: qty 60, 30d supply, fill #0
  Filled 2020-07-06: qty 60, 30d supply, fill #1
  Filled 2020-08-14: qty 60, 30d supply, fill #2

## 2020-06-03 MED ORDER — LISINOPRIL 20 MG PO TABS
ORAL_TABLET | Freq: Every day | ORAL | 0 refills | Status: DC
  Filled 2020-06-03: qty 30, 30d supply, fill #0
  Filled 2020-07-06: qty 30, 30d supply, fill #1
  Filled 2020-07-27: qty 30, 30d supply, fill #2

## 2020-06-03 NOTE — Telephone Encounter (Signed)
Copied from CRM [id number]. Topic: Quick Communication - Rx Refill/Question >> Jun 03, 2020 11:10 AM Louie Bun, Rosey Bath D wrote: Medication: metFORMIN (GLUCOPHAGE) 1000 MG tablet,levothyroxine (SYNTHROID) 100 MCG tablet, lisinopril (ZESTRIL) 20 MG tablet,glipiZIDE (GLUCOTROL XL) 10 MG 24 hr tablet  Has the patient contacted their pharmacy? Yes.   (Agent: If no, request that the patient contact the pharmacy for the refill.) (Agent: If yes, when and what did the pharmacy advise?)  Preferred Pharmacy (with phone number or street name): [facility]  Agent: Please be advised that RX refills may take up to 3 business days. We ask that you follow-up with your pharmacy.

## 2020-06-30 ENCOUNTER — Other Ambulatory Visit: Payer: Self-pay | Admitting: *Deleted

## 2020-06-30 ENCOUNTER — Other Ambulatory Visit: Payer: Self-pay

## 2020-06-30 DIAGNOSIS — Z124 Encounter for screening for malignant neoplasm of cervix: Secondary | ICD-10-CM

## 2020-06-30 NOTE — Progress Notes (Signed)
Patient: Erin Huff           Date of Birth: May 17, 1955           MRN: [id number] Visit Date: 06/30/2020 PCP: Claiborne Rigg, NP  Cervical Cancer Screening Do you smoke?: No Have you ever had or been told you have an allergy to latex products?: No Marital status: Married Date of last pap smear: Within the last year Date of last menstrual period:  (postmenopausal) Number of pregnancies: 5 Number of births: 5 (2 passed away) Have you ever had any of the following? Hysterectomy: No Tubal ligation (tubes tied): No Abnormal bleeding: No Abnormal pap smear: Yes Venereal warts: No A sex partner with venereal warts: No A high risk* sex partner: No  Cervical Exam  Abnormal Observations: Pinpoint sized flesh colored bump observed right lower outer external genitalia.  Recommendations: Last Pap smear was 06/04/2019 at [facility] and was LSIL with positive HPV. Per Vonzella Nipple repeat in Pap smear in one year was recommended for follow-up. Patient has a history of one other abnormal Pap smear 03/31/2017 at [facility] and was LSIL with positive HPV. 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 Last two Pap smear results are available in Epic. Next Pap and follow-up will be based on the result of today's Pap smear. Patient requested referral to the [facility] for [facility] to follow-up for the flesh colored bump on her external genitalia. Patient given the [facility] Application and referral sent. Let patient know that will follow-up with her within the next couple of weeks with results of today's Pap smear by phone or letter. Patient verbalized understanding.    Used Spanish interpreter [employer] from [place].  Patient's History Patient Active Problem List   Diagnosis Date Noted  . Uncontrolled type 2 diabetes mellitus with hyperglycemia, without long-term current use of insulin (HCC)  05/23/2017  . Mixed hyperlipidemia 12/20/2016  . Hypertension 03/01/2016  . Thrombocytopenia (HCC) 01/14/2015  . Hyponatremia 01/14/2015  . Allergic rhinitis 05/29/2009  . OBESITY 12/25/2008  . ACUTE CYSTITIS 02/26/2008  . SKIN RASH 11/21/2007  . Hypothyroidism 10/26/2007  . HYPERCHOLESTEROLEMIA 10/26/2007  . CARDIAC MURMUR 10/26/2007  . Diabetes mellitus (HCC) 04/20/2006  . Essential hypertension 04/20/2006   Past Medical History:  Diagnosis Date  . Allergy   . Depression    mild  . Diabetes mellitus without complication (HCC)   . GERD (gastroesophageal reflux disease)   . Heart murmur   . Hyperlipidemia   . Hypertension   . Thyroid disease     Family History  Problem Relation Age of Onset  . Hypertension Sister   . Diabetes Brother   . Breast cancer Maternal Aunt   . Colon cancer Neg Hx   . Colon polyps Neg Hx   . Esophageal cancer Neg Hx   . Rectal cancer Neg Hx   . Stomach cancer Neg Hx     Social History   Occupational History  . Not on file  Tobacco Use  . Smoking status: Never Smoker  . Smokeless tobacco: Never Used  Vaping Use  . Vaping Use: Never used  Substance and Sexual Activity  . Alcohol use: No    Comment: rare   . Drug use: No  . Sexual activity: Yes    Birth control/protection: None

## 2020-06-30 NOTE — Addendum Note (Signed)
Addended by: Narda Rutherford on: 06/30/2020 03:42 PM   Modules accepted: Orders

## 2020-07-02 LAB — CYTOLOGY - PAP
Comment: NEGATIVE
High risk HPV: POSITIVE — AB

## 2020-07-06 ENCOUNTER — Encounter: Payer: Self-pay | Admitting: Obstetrics and Gynecology

## 2020-07-06 ENCOUNTER — Other Ambulatory Visit (HOSPITAL_COMMUNITY)
Admission: RE | Admit: 2020-07-06 | Discharge: 2020-07-06 | Disposition: A | Discharge status: Home / Self Care | Payer: No Typology Code available for payment source | Source: Ambulatory Visit | Attending: Obstetrics and Gynecology | Admitting: Obstetrics and Gynecology

## 2020-07-06 ENCOUNTER — Ambulatory Visit (INDEPENDENT_AMBULATORY_CARE_PROVIDER_SITE_OTHER): Payer: Self-pay | Admitting: Obstetrics and Gynecology

## 2020-07-06 ENCOUNTER — Other Ambulatory Visit: Payer: Self-pay

## 2020-07-06 DIAGNOSIS — R87612 Low grade squamous intraepithelial lesion on cytologic smear of cervix (LGSIL): Secondary | ICD-10-CM

## 2020-07-06 NOTE — Patient Instructions (Signed)

## 2020-07-06 NOTE — Progress Notes (Signed)
    GYNECOLOGY CLINIC COLPOSCOPY PROCEDURE NOTE  65 y.o. G5P0 here for colposcopy for low-grade squamous intraepithelial neoplasia (LGSIL - encompassing HPV,mild dysplasia,CIN I) pap smear on 06/2020. Discussed role for HPV in cervical dysplasia, need for surveillance. H/O abnormal pap in the past, ? Prior colpo  Patient given informed consent, signed copy in the chart, time out was performed.  Placed in lithotomy position. Cervix viewed with speculum and colposcope after application of acetic acid.   Colposcopy adequate? Yes  acetowhite lesion(s) noted at 12 o'clock; corresponding biopsies obtained.  ECC specimen obtained. Monsel's applied All specimens were labelled and sent to pathology.   Patient was given post procedure instructions.  Will follow up pathology and manage accordingly.  Routine preventative health maintenance measures emphasized.    Nettie Elm, MD, FACOG Attending Obstetrician & Gynecologist Center for [facility], [facility]

## 2020-07-07 ENCOUNTER — Telehealth: Payer: Self-pay

## 2020-07-07 ENCOUNTER — Other Ambulatory Visit: Payer: Self-pay

## 2020-07-07 NOTE — Telephone Encounter (Addendum)
Colpo completed on 07/06/2020.  ----- Message from Priscille Heidelberg, RN sent at 07/06/2020  3:32 PM EDT ----- Erin Huff,  Please notify patient of results and that she will need a colpo for follow-up.

## 2020-07-08 ENCOUNTER — Other Ambulatory Visit: Payer: Self-pay

## 2020-07-08 LAB — SURGICAL PATHOLOGY

## 2020-07-08 MED FILL — Omega-3-acid Ethyl Esters Cap 1 GM: ORAL | 30 days supply | Qty: 120 | Fill #0 | Status: AC

## 2020-07-10 ENCOUNTER — Other Ambulatory Visit: Payer: Self-pay

## 2020-07-13 ENCOUNTER — Telehealth: Payer: Self-pay | Admitting: Lactation Services

## 2020-07-13 NOTE — Telephone Encounter (Signed)
Called patient with assistance of Raquel Carrolyn Meiers, [employer].   Patient given results of Colpo. Reviewed she can return in 1 year for Pap and HPV or can opt for Cryotherapy to freeze abnormal cells. Patient would like to think about it and will call the office back if she would like to have the Cryotherapy. Patient given call back number.   She was informed if wants a repeat Pap in 1 year, she needs to call to schedule a Pap in March or April of next year.      Patient reports she was using Zolmic cream in [place] and would like to know if she can still use it. Internet search reveals it is the same of Miconazole, reviewed that is ok if she is having yeast symptoms. Marland Kitchen

## 2020-07-13 NOTE — Telephone Encounter (Signed)
-----   Message from Hermina Staggers, MD sent at 07/10/2020 10:13 AM EDT ----- Please let pt know that her Colpo revealed CIN 1. She can elect to have a repeat pap smear with HPV co testing in 1 yr or have cryotherapy to the cervix.  Thanks Casimiro Needle

## 2020-07-27 ENCOUNTER — Other Ambulatory Visit: Payer: Self-pay

## 2020-07-28 ENCOUNTER — Other Ambulatory Visit: Payer: Self-pay

## 2020-07-29 ENCOUNTER — Encounter: Payer: Self-pay | Admitting: Nurse Practitioner

## 2020-07-29 ENCOUNTER — Other Ambulatory Visit: Payer: Self-pay

## 2020-07-29 ENCOUNTER — Ambulatory Visit: Payer: Self-pay | Attending: Nurse Practitioner | Admitting: Nurse Practitioner

## 2020-07-29 DIAGNOSIS — E781 Pure hyperglyceridemia: Secondary | ICD-10-CM

## 2020-07-29 DIAGNOSIS — J309 Allergic rhinitis, unspecified: Secondary | ICD-10-CM

## 2020-07-29 DIAGNOSIS — D72829 Elevated white blood cell count, unspecified: Secondary | ICD-10-CM

## 2020-07-29 DIAGNOSIS — E1165 Type 2 diabetes mellitus with hyperglycemia: Secondary | ICD-10-CM

## 2020-07-29 DIAGNOSIS — E039 Hypothyroidism, unspecified: Secondary | ICD-10-CM

## 2020-07-29 DIAGNOSIS — I10 Essential (primary) hypertension: Secondary | ICD-10-CM

## 2020-07-29 MED ORDER — AMLODIPINE BESYLATE 5 MG PO TABS
ORAL_TABLET | Freq: Every day | ORAL | 1 refills | Status: DC
  Filled 2020-07-29: qty 30, 30d supply, fill #0

## 2020-07-29 MED ORDER — LISINOPRIL 20 MG PO TABS
ORAL_TABLET | Freq: Every day | ORAL | 1 refills | Status: DC
  Filled 2020-07-29: qty 30, 30d supply, fill #0
  Filled 2020-09-04: qty 30, 30d supply, fill #1
  Filled 2020-10-02: qty 30, 30d supply, fill #2
  Filled 2020-10-29: qty 30, 30d supply, fill #3

## 2020-07-29 MED ORDER — OMEPRAZOLE 20 MG PO CPDR
20.0000 mg | DELAYED_RELEASE_CAPSULE | Freq: Every day | ORAL | 1 refills | Status: DC
  Filled 2020-07-29: qty 30, 30d supply, fill #0

## 2020-07-29 MED ORDER — GLUCOSE BLOOD VI STRP
ORAL_STRIP | 12 refills | Status: DC
  Filled 2020-07-29: qty 100, 25d supply, fill #0
  Filled 2020-11-11: qty 100, 25d supply, fill #1
  Filled 2021-01-28: qty 100, 25d supply, fill #2
  Filled 2021-02-26: qty 100, 25d supply, fill #0
  Filled 2021-02-26: qty 100, 25d supply, fill #3
  Filled 2021-05-03: qty 100, 25d supply, fill #0

## 2020-07-29 MED ORDER — LORATADINE 10 MG PO TABS
10.0000 mg | ORAL_TABLET | Freq: Every day | ORAL | 3 refills | Status: DC
  Filled 2020-07-29: qty 30, 30d supply, fill #0

## 2020-07-29 MED ORDER — TRUEPLUS LANCETS 28G MISC
5 refills | Status: DC
  Filled 2020-07-29: qty 100, 25d supply, fill #0
  Filled 2021-01-28: qty 100, 25d supply, fill #1

## 2020-07-29 MED ORDER — OMEGA-3-ACID ETHYL ESTERS 1 G PO CAPS
2.0000 | ORAL_CAPSULE | Freq: Two times a day (BID) | ORAL | 1 refills | Status: DC
  Filled 2020-07-29 – 2020-08-27 (×2): qty 120, 30d supply, fill #0

## 2020-07-29 MED ORDER — LEVOTHYROXINE SODIUM 100 MCG PO TABS
ORAL_TABLET | ORAL | 2 refills | Status: DC
  Filled 2020-07-29: qty 30, 30d supply, fill #0
  Filled 2020-09-04: qty 30, 30d supply, fill #1
  Filled 2020-10-02: qty 30, 30d supply, fill #2

## 2020-07-29 MED ORDER — GLIPIZIDE ER 10 MG PO TB24: ORAL_TABLET | Freq: Every day | ORAL | 1 refills | Status: DC

## 2020-07-29 NOTE — Progress Notes (Signed)
Virtual Visit via Telephone Note Due to national recommendations of social distancing due to [place] 19, telehealth visit is felt to be most appropriate for this patient at this time.  I discussed the limitations, risks, security and privacy concerns of performing an evaluation and management service by telephone and the availability of in person appointments. I also discussed with the patient that there may be a patient responsible charge related to this service. The patient expressed understanding and agreed to proceed.    I connected with Erin Huff on 07/29/20  at   4:10 PM EDT  EDT by telephone and verified that I am speaking with the correct person using two identifiers.  Location of Patient: Private Residence   Location of Provider: [place] and [employer] Office    Persons participating in Telemedicine visit: Erin Rankins FNP-BC Erin Huff  Spanish Interpreter Erin Huff  ID [phone]    History of Present Illness: Telemedicine visit for: Follow up She has a past medical history of Allergy, Depression, DM2, GERD, Heart murmur, Hyperlipidemia, Hypertension, and Thyroid disease.  DM 2 Not well optimized. Blood glucose readings at home 130-140s.  She is taking glipizide 10 mg daily and metformin 1000 mg twice daily.  LDL not at goal with twice daily dosing omega-3. Lab Results  Component Value Date   HGBA1C 7.7 (A) 01/21/2020   Lab Results  Component Value Date   LDLCALC 86 10/21/2019   Essential Hypertension Not well controlled with amlodipine 5 mg daily and lisinopril 20 mg daily. Denies chest pain, shortness of breath, palpitations, lightheadedness, dizziness, headaches or BLE edema. She endorses medication adherence.  BP Readings from Last 3 Encounters:  07/06/20 (!) 149/84  01/21/20 (!) 152/82  09/29/19 (!) 150/80   Hypothyroidism She denies any symptoms of hypo or hyperthyroidism.  Taking Synthroid 100 mcg daily as prescribed. Lab  Results  Component Value Date   TSH 3.240 10/21/2019    ABNORMAL PAP LGSIL She is status post colposcopy on 07/06/2020.  Today she states she wanted to speak to me regarding how she should proceed after obtaining her colposcopy results.  Options are repeat Pap with HPV cotesting in 1 year or cryotherapy to the cervix.  I have deferred her back to gynecology for her questions as cryotherapy treatment is beyond my expertise. Past Medical History:  Diagnosis Date  . Allergy   . Depression    mild  . Diabetes mellitus without complication ([place])   . GERD (gastroesophageal reflux disease)   . Heart murmur   . Hyperlipidemia   . Hypertension   . Thyroid disease     Past Surgical History:  Procedure Laterality Date  . NO PAST SURGERIES      Family History  Problem Relation Age of Onset  . Hypertension Sister   . Diabetes Brother   . Breast cancer Maternal Aunt   . Colon cancer Neg Hx   . Colon polyps Neg Hx   . Esophageal cancer Neg Hx   . Rectal cancer Neg Hx   . Stomach cancer Neg Hx     Social History   Socioeconomic History  . Marital status: Married    Spouse name: Not on file  . Number of children: 5  . Years of education: Not on file  . Highest education level: 2nd grade  Occupational History  . Not on file  Tobacco Use  . Smoking status: Never Smoker  . Smokeless tobacco: Never Used  Vaping Use  . Vaping Use: Never used  Substance and Sexual Activity  . Alcohol use: No    Comment: rare   . Drug use: No  . Sexual activity: Yes    Birth control/protection: None  Other Topics Concern  . Not on file  Social History Narrative   Sometimes husband is aggressive but she talks back and is aggressive with him. Does not take it.    Social Determinants of Health   Financial Resource Strain: Not on file  Food Insecurity: Not on file  Transportation Needs: Not on file  Physical Activity: Not on file  Stress: Not on file  Social Connections: Not on file      Observations/Objective: Awake, alert and oriented x 3   Review of Systems  Constitutional: Negative for fever, malaise/fatigue and weight loss.  HENT: Negative.  Negative for nosebleeds.   Eyes: Negative.  Negative for blurred vision, double vision and photophobia.  Respiratory: Negative.  Negative for cough and shortness of breath.   Cardiovascular: Negative.  Negative for chest pain, palpitations and leg swelling.  Gastrointestinal: Positive for heartburn. Negative for nausea and vomiting.  Musculoskeletal: Negative.  Negative for myalgias.  Neurological: Negative.  Negative for dizziness, focal weakness, seizures and headaches.  Psychiatric/Behavioral: Negative.  Negative for suicidal ideas.    Assessment and Plan: Diagnoses and all orders for this visit:  Uncontrolled type 2 diabetes mellitus with hyperglycemia, without long-term current use of insulin (HCC) -     glipiZIDE (GLUCOTROL XL) 10 MG 24 hr tablet; TAKE 1 TABLET (10 MG TOTAL) BY MOUTH DAILY WITH BREAKFAST. -     glucose blood test strip; USE AS INSTRUCTED. -     TRUEplus Lancets 28G MISC; Use as directed -     CMP14+EGFR; Future -     Hemoglobin A1c; Future Continue blood sugar control as discussed in office today, low carbohydrate diet, and regular physical exercise as tolerated, 150 minutes per week (30 min each day, 5 days per week, or 50 min 3 days per week). Keep blood sugar logs with fasting goal of 90-130 mg/dl, post prandial (after you eat) less than 180.  For Hypoglycemia: BS <60 and Hyperglycemia BS >400; contact the clinic ASAP. Annual eye exams and foot exams are recommended.  Primary hypertension -     amLODipine (NORVASC) 5 MG tablet; TAKE 1 TABLET (5 MG TOTAL) BY MOUTH DAILY. -     lisinopril (ZESTRIL) 20 MG tablet; TAKE 1 TABLET (20 MG TOTAL) BY MOUTH DAILY. -     CMP14+EGFR; Future Continue all antihypertensives as prescribed.  Remember to bring in your blood pressure log with you for your follow up  appointment.  DASH/Mediterranean Diets are healthier choices for HTN.    Acquired hypothyroidism -     levothyroxine (SYNTHROID) 100 MCG tablet; TAKE 1 TABLET (100 MCG TOTAL) BY MOUTH DAILY BEFORE BREAKFAST. -     Thyroid Panel With TSH; Future  Allergic rhinitis, unspecified seasonality, unspecified trigger -     loratadine (CLARITIN) 10 MG tablet; Take 1 tablet (10 mg total) by mouth daily.  Hypertriglyceridemia -     omega-3 acid ethyl esters (LOVAZA) 1 g capsule; TAKE 2 CAPSULES (2 G TOTAL) BY MOUTH 2 (TWO) TIMES DAILY. -     Lipid panel; Future INSTRUCTIONS: Work on a low fat, heart healthy diet and participate in regular aerobic exercise program by working out at least 150 minutes per week; 5 days a week-30 minutes per day. Avoid red meat/beef/steak,  fried foods. junk foods, sodas, sugary drinks, unhealthy  snacking, alcohol and smoking.  Drink at least 80 oz of water per day and monitor your carbohydrate intake daily.    Leukocytosis, unspecified type -     CBC with Differential; Future  GERD -     omeprazole (PRILOSEC) 20 MG capsule; TAKE 1 CAPSULE (20 MG TOTAL) BY MOUTH DAILY. FOR ACID REFLUX INSTRUCTIONS: Avoid GERD Triggers: acidic, spicy or fried foods, caffeine, coffee, sodas,  alcohol and chocolate.     Follow Up Instructions Return in about 3 months (around 10/29/2020).     I discussed the assessment and treatment plan with the patient. The patient was provided an opportunity to ask questions and all were answered. The patient agreed with the plan and demonstrated an understanding of the instructions.   The patient was advised to call back or seek an in-person evaluation if the symptoms worsen or if the condition fails to improve as anticipated.  I provided 16 minutes of non-face-to-face time during this encounter including median intraservice time, reviewing previous notes, labs, imaging, medications and explaining diagnosis and management.  Gildardo Pounds, FNP-BC

## 2020-07-30 ENCOUNTER — Ambulatory Visit: Payer: Self-pay | Attending: Nurse Practitioner

## 2020-07-30 ENCOUNTER — Other Ambulatory Visit: Payer: Self-pay

## 2020-07-30 DIAGNOSIS — E781 Pure hyperglyceridemia: Secondary | ICD-10-CM

## 2020-07-30 DIAGNOSIS — E1165 Type 2 diabetes mellitus with hyperglycemia: Secondary | ICD-10-CM

## 2020-07-30 DIAGNOSIS — I10 Essential (primary) hypertension: Secondary | ICD-10-CM

## 2020-07-30 DIAGNOSIS — D72829 Elevated white blood cell count, unspecified: Secondary | ICD-10-CM

## 2020-07-30 DIAGNOSIS — E039 Hypothyroidism, unspecified: Secondary | ICD-10-CM

## 2020-07-31 ENCOUNTER — Other Ambulatory Visit: Payer: Self-pay

## 2020-07-31 LAB — CMP14+EGFR
ALT: 16 IU/L (ref 0–32)
AST: 18 IU/L (ref 0–40)
Albumin/Globulin Ratio: 1.5 (ref 1.2–2.2)
Albumin: 4.4 g/dL (ref 3.8–4.8)
Alkaline Phosphatase: 104 IU/L (ref 44–121)
BUN/Creatinine Ratio: 20 (ref 12–28)
BUN: 13 mg/dL (ref 8–27)
Bilirubin Total: 0.2 mg/dL (ref 0.0–1.2)
CO2: 22 mmol/L (ref 20–29)
Calcium: 9.3 mg/dL (ref 8.7–10.3)
Chloride: 101 mmol/L (ref 96–106)
Creatinine, Ser: 0.66 mg/dL (ref 0.57–1.00)
Globulin, Total: 2.9 g/dL (ref 1.5–4.5)
Glucose: 205 mg/dL — ABNORMAL HIGH (ref 65–99)
Potassium: 4.7 mmol/L (ref 3.5–5.2)
Sodium: 138 mmol/L (ref 134–144)
Total Protein: 7.3 g/dL (ref 6.0–8.5)
eGFR: 99 mL/min/{1.73_m2} (ref >59)

## 2020-07-31 LAB — CBC WITH DIFFERENTIAL/PLATELET
Basophils Absolute: 0.1 10*3/uL (ref 0.0–0.2)
Basos: 1 %
EOS (ABSOLUTE): 0.2 10*3/uL (ref 0.0–0.4)
Eos: 3 %
Hematocrit: 37.1 % (ref 34.0–46.6)
Hemoglobin: 12.1 g/dL (ref 11.1–15.9)
Immature Grans (Abs): 0 10*3/uL (ref 0.0–0.1)
Immature Granulocytes: 1 %
Lymphocytes Absolute: 2.7 10*3/uL (ref 0.7–3.1)
Lymphs: 31 %
MCH: 29.4 pg (ref 26.6–33.0)
MCHC: 32.6 g/dL (ref 31.5–35.7)
MCV: 90 fL (ref 79–97)
Monocytes Absolute: 0.5 10*3/uL (ref 0.1–0.9)
Monocytes: 6 %
Neutrophils Absolute: 5.2 10*3/uL (ref 1.4–7.0)
Neutrophils: 58 %
Platelets: 231 10*3/uL (ref 150–450)
RBC: 4.12 x10E6/uL (ref 3.77–5.28)
RDW: 12.5 % (ref 11.7–15.4)
WBC: 8.7 10*3/uL (ref 3.4–10.8)

## 2020-07-31 LAB — LIPID PANEL
Chol/HDL Ratio: 3.2 ratio (ref 0.0–4.4)
Cholesterol, Total: 184 mg/dL (ref 100–199)
HDL: 57 mg/dL (ref >39)
LDL Chol Calc (NIH): 87 mg/dL (ref 0–99)
Triglycerides: 239 mg/dL — ABNORMAL HIGH (ref 0–149)
VLDL Cholesterol Cal: 40 mg/dL (ref 5–40)

## 2020-07-31 LAB — HEMOGLOBIN A1C
Est. average glucose Bld gHb Est-mCnc: 171 mg/dL
Hgb A1c MFr Bld: 7.6 % — ABNORMAL HIGH (ref 4.8–5.6)

## 2020-07-31 LAB — THYROID PANEL WITH TSH
Free Thyroxine Index: 2.2 (ref 1.2–4.9)
T3 Uptake Ratio: 25 % (ref 24–39)
T4, Total: 8.8 ug/dL (ref 4.5–12.0)
TSH: 5.91 u[IU]/mL — ABNORMAL HIGH (ref 0.450–4.500)

## 2020-08-02 ENCOUNTER — Other Ambulatory Visit: Payer: Self-pay | Admitting: Nurse Practitioner

## 2020-08-02 DIAGNOSIS — E039 Hypothyroidism, unspecified: Secondary | ICD-10-CM

## 2020-08-02 DIAGNOSIS — E1165 Type 2 diabetes mellitus with hyperglycemia: Secondary | ICD-10-CM

## 2020-08-02 MED ORDER — GLIPIZIDE ER 10 MG PO TB24
20.0000 mg | ORAL_TABLET | Freq: Every day | ORAL | 0 refills | Status: DC
  Filled 2020-08-02: qty 180, 90d supply, fill #0
  Filled 2020-08-14: qty 60, 30d supply, fill #0
  Filled 2020-10-29: qty 60, 30d supply, fill #1

## 2020-08-03 ENCOUNTER — Other Ambulatory Visit: Payer: Self-pay

## 2020-08-07 ENCOUNTER — Telehealth: Payer: Self-pay | Admitting: Nurse Practitioner

## 2020-08-07 NOTE — Telephone Encounter (Signed)
Copied from CRM [id number]. Topic: General - Other >> Aug 07, 2020  2:21 PM Jaquita Rector A wrote: Reason for CRM: Patient called in to inquire about results of testing done last week when she was seen. Asking for a call back at Ph# [phone]

## 2020-08-13 NOTE — Telephone Encounter (Signed)
Patient aware of lab results.  Contacted by Viann Shove, RN

## 2020-08-14 ENCOUNTER — Other Ambulatory Visit: Payer: Self-pay

## 2020-08-18 ENCOUNTER — Other Ambulatory Visit: Payer: Self-pay

## 2020-08-25 ENCOUNTER — Other Ambulatory Visit: Payer: Self-pay

## 2020-08-25 ENCOUNTER — Encounter: Payer: Self-pay | Admitting: Physician Assistant

## 2020-08-25 ENCOUNTER — Ambulatory Visit: Payer: Self-pay | Admitting: Physician Assistant

## 2020-08-25 VITALS — BP 145/80 | HR 78 | Temp 98.2°F | Resp 18 | Ht 60.0 in | Wt 168.0 lb

## 2020-08-25 DIAGNOSIS — E1165 Type 2 diabetes mellitus with hyperglycemia: Secondary | ICD-10-CM

## 2020-08-25 DIAGNOSIS — E039 Hypothyroidism, unspecified: Secondary | ICD-10-CM

## 2020-08-25 DIAGNOSIS — G43009 Migraine without aura, not intractable, without status migrainosus: Secondary | ICD-10-CM

## 2020-08-25 MED ORDER — SUMATRIPTAN SUCCINATE 50 MG PO TABS
50.0000 mg | ORAL_TABLET | ORAL | 0 refills | Status: DC | PRN
  Filled 2020-08-25: qty 9, 23d supply, fill #0

## 2020-08-25 NOTE — Patient Instructions (Signed)
Cefalea migraosa Migraine Headache Una cefalea migraosa es un dolor intenso y punzante en uno o ambos lados de la cabeza. Las cefaleas migraosas tambin pueden causar otros sntomas, como nuseas, vmitos y sensibilidad a la luz y el ruido. Una cefalea migraosa puede durar desde 4 horas hasta 3 das. Hable con su mdico sobre los factores que pueden causar (desencadenar) las cefaleas migraosas. Cules son las causas? Se desconoce la causa exacta de esta afeccin. Sin embargo, una migraa puede aparecer cuando los nervios del cerebro se irritan y liberan ciertas sustancias qumicas que causan inflamacin de los vasos sanguneos. Esta inflamacin provoca dolor. Esta afeccin puede desencadenarse o ser causada por lo siguiente: Consumo de alcohol. Consumo de cigarrillos. Tomar medicamentos como por ejemplo: Medicamentos para aliviar el dolor torcico (nitroglicerina). Anticonceptivos orales. Estrgeno. Ciertos medicamentos para la presin arterial. Comer o beber productos que contienen nitratos, glutamato, aspartamo o tiramina. Los quesos aejados, el chocolate o la cafena tambin pueden ser desencadenantes. Hacer actividad fsica. Otros factores que pueden provocar cefalea migraosa son los siguientes: Menstruacin. Embarazo. Hambre. Estrs. Dormir poco o dormir demasiado. Cambios climticos. Fatiga. Qu incrementa el riesgo? Los siguientes factores pueden hacer que usted sea ms propenso a tener migraas: Tener cierta edad. Es ms probable que esta afeccin se manifieste en personas que tienen entre 25 y 55 aos. Ser mujer. Tener antecedentes familiares de cefalea migraosa. Ser de raza caucsica. Tener una afeccin de salud mental, como depresin o ansiedad. Ser obeso. Cules son los signos o los sntomas? El principal sntoma de esta afeccin es el dolor intenso y punzante. Este dolor puede tener las siguientes caractersticas: Puede aparecer en cualquier regin de la  cabeza, tanto de un lado como de ambos. Puede interferir con las actividades de la vida cotidiana. Puede empeorar con la actividad fsica. Puede empeorar ante la exposicin a luces brillantes o a ruidos fuertes. Otros sntomas pueden incluir: Nuseas. Vmitos. Mareos. Sensibilidad general a las luces brillantes, a los ruidos fuertes o a los olores. Antes de tener una cefalea migraosa, puede recibir seales de advertencia (aura). Un aura puede incluir: Ver luces intermitentes o tener puntos ciegos. Ver puntos brillantes, halos o lneas en zigzag. Tener una visin en tnel o visin borrosa. Sentir entumecimiento u hormigueo. Tener dificultad para hablar. Debilidad muscular. Algunas personas tienen sntomas despus de una cefalea migraosa (fase posdromal), como los siguientes: Cansancio. Dificultad para concentrarte. Cmo se diagnostica? La cefalea migraosa se diagnostica en funcin de lo siguiente: Sus sntomas. Un examen fsico. Pruebas, como, por ejemplo: Tomografa computarizada (TC) o resonancia magntica (RM) de la cabeza. Estos estudios de diagnstico por imgenes pueden ayudar a descartar otras causas de cefalea. Tomar una muestra de lquido de la mdula espinal (puncin lumbar) para analizar (anlisis de lquido cefalorraqudeo o anlisis de LCR). Cmo se trata? Esta afeccin se puede tratar con medicamentos para: Aliviar el dolor. Aliviar las nuseas. Prevenir las cefaleas migraosas. El tratamiento de esta afeccin tambin puede incluir lo siguiente: Acupuntura. Cambios en el estilo de vida, como evitar los alimentos que provocan las cefaleas migraosas. Biorretroalimentacin. Terapia cognitiva conductual. Siga estas instrucciones en su casa: Medicamentos Use los medicamentos de venta libre y los recetados solamente como se lo haya indicado el mdico. Pregntele al mdico si el medicamento recetado: Hace que sea necesario que evite conducir o usar maquinaria  pesada. Puede causarle estreimiento. Es posible que tenga que tomar estas medidas para prevenir o tratar el estreimiento: Beber suficiente lquido como para mantener la orina de color amarillo plido. Tomar   medicamentos recetados o de [employer]. Consumir alimentos ricos en fibra, como frijoles, cereales integrales, y frutas y verduras frescas. Limitar el consumo de alimentos ricos en grasa y azcares procesados, como los alimentos fritos o dulces. Estilo de vida No beba alcohol. No consuma ningn producto que contenga nicotina o tabaco, como cigarrillos, cigarrillos electrnicos y tabaco de Theatre manager. Si necesita ayuda para dejar de fumar, consulte al mdico. Duerma como mnimo 8 horas todas las noches. Encuentre modos de [employer], por ejemplo, a travs de la meditacin, la respiracin profunda o el yoga. Indicaciones generales     Lleve un registro diario para Financial risk analyst lo que Advice worker. Registre, por ejemplo, lo siguiente: Lo que usted come y bebe. El tiempo que duerme. Algn cambio en su dieta o en los medicamentos. Si tiene una cefalea migraosa: Evite los factores que [employer] sntomas, como las luces brillantes. Resulta til acostarse en una habitacin oscura y silenciosa. No conduzca vehculos ni opere maquinaria pesada. Pregntele al mdico qu actividades son seguras para usted cuando tiene sntomas. Concurra a todas las visitas de [address] se lo haya indicado el mdico. Esto es importante. Comunquese con un mdico si: Tiene sntomas de cefalea migraosa que son distintos o ms intensos que los habituales. Tiene ms de [address] de cefalea por mes. Solicite ayuda inmediatamente si: La cefalea migraosa se vuelve cada vez ms intensa. La cefalea migraosa dura ms de 72 horas. Tiene fiebre. Presenta rigidez en el cuello. Presenta prdida de la visin. Siente debilidad en los msculos o que no puede controlarlos. Comienza a  perder el equilibrio con frecuencia. Presenta dificultad para caminar. Se desmaya. Tiene una convulsin. Resumen [place] cefalea migraosa es un dolor intenso y punzante en uno o ambos lados de la cabeza. Las migraas tambin pueden causar otros sntomas, como nuseas, vmitos y sensibilidad a la luz y el ruido. Esta afeccin puede tratarse con medicamentos y cambios en el estilo de vida. Tambin es posible que deba evitar ciertos factores que desencadenan una cefalea migraosa. Lleve un registro diario para Financial risk analyst lo que Advice worker. Comunquese con el mdico si tiene ms de 15 das de cefalea en un mes o presenta sntomas de cefalea migraosa que son distintos o ms intensos que los habituales. Esta informacin no tiene Theme park manager el consejo del mdico. Asegresede hacerle al mdico cualquier pregunta que tenga. Document Revised: 04/13/2018 Document Reviewed: 04/13/2018 Elsevier Patient Education  2022 [employer].

## 2020-08-25 NOTE — Progress Notes (Signed)
Established Patient Office Visit  Subjective:  Patient ID: Erin Huff, female    DOB: [birth year]  Age: 66 y.o. MRN: [id number]  CC:  Chief Complaint  Patient presents with   Headache    HPI Erin Huff States that she has been having all over headaches for the past two days, describes it throbbing, does endorses some left side abdominal pain, but denies n/v.  No photophobia   .  States that she took tylenol without relief   States that she is drinking about 4 bottles of water States that she is sleeping well Has not had recent eye exam - does endorse some change in vision in right side States that her stress level is slightly elevated , does endorse that she will feel it in her neck  Also endorse that she colored her hair the day before the headache started and wondered if that caused it   Due to language barrier, an interpreter was present during the history-taking and subsequent discussion (and for part of the physical exam) with this patient.     Past Medical History:  Diagnosis Date   Allergy    Depression    mild   Diabetes mellitus without complication (HCC)    GERD (gastroesophageal reflux disease)    Heart murmur    Hyperlipidemia    Hypertension    Thyroid disease     Past Surgical History:  Procedure Laterality Date   NO PAST SURGERIES      Family History  Problem Relation Age of Onset   Hypertension Sister    Diabetes Brother    Breast cancer Maternal Aunt    Colon cancer Neg Hx    Colon polyps Neg Hx    Esophageal cancer Neg Hx    Rectal cancer Neg Hx    Stomach cancer Neg Hx     Social History   Socioeconomic History   Marital status: Married    Spouse name: Not on file   Number of children: 5   Years of education: Not on file   Highest education level: 2nd grade  Occupational History   Not on file  Tobacco Use   Smoking status: Never   Smokeless tobacco: Never  Vaping Use   Vaping Use: Never used   Substance and Sexual Activity   Alcohol use: No    Comment: rare    Drug use: No   Sexual activity: Yes    Birth control/protection: None  Other Topics Concern   Not on file  Social History Narrative   Sometimes husband is aggressive but she talks back and is aggressive with him. Does not take it.    Social Determinants of Health   Financial Resource Strain: Not on file  Food Insecurity: Not on file  Transportation Needs: Not on file  Physical Activity: Not on file  Stress: Not on file  Social Connections: Not on file  Intimate Partner Violence: Not on file    Outpatient Medications Prior to Visit  Medication Sig Dispense Refill   amLODipine (NORVASC) 5 MG tablet TAKE 1 TABLET (5 MG TOTAL) BY MOUTH DAILY. 90 tablet 1   Blood Glucose Monitoring Suppl (TRUE METRIX METER) w/Device KIT Use as directed 1 kit 0   glipiZIDE (GLUCOTROL XL) 10 MG 24 hr tablet Take 2 tablets (20 mg total) by mouth daily with breakfast. 180 tablet 0   glucose blood test strip USE AS INSTRUCTED. 100 strip 12   levothyroxine (SYNTHROID) 100 MCG tablet TAKE 1 TABLET (  100 MCG TOTAL) BY MOUTH DAILY BEFORE BREAKFAST. 30 tablet 2   lisinopril (ZESTRIL) 20 MG tablet TAKE 1 TABLET (20 MG TOTAL) BY MOUTH DAILY. 90 tablet 1   loratadine (CLARITIN) 10 MG tablet Take 1 tablet (10 mg total) by mouth daily. 90 tablet 3   Menthol (CEPACOL SORE THROAT) 5.4 MG LOZG Take daily by mouth as needed 30 lozenge 0   metFORMIN (GLUCOPHAGE) 1000 MG tablet TAKE 1 TABLET (1,000 MG TOTAL) BY MOUTH 2 (TWO) TIMES DAILY WITH A MEAL. 180 tablet 0   Multiple Vitamin (MULTIVITAMIN) tablet Take 1 tablet by mouth daily.     omega-3 acid ethyl esters (LOVAZA) 1 g capsule TAKE 2 CAPSULES (2 G TOTAL) BY MOUTH 2 (TWO) TIMES DAILY. 360 capsule 1   omeprazole (PRILOSEC) 20 MG capsule TAKE 1 CAPSULE (20 MG TOTAL) BY MOUTH DAILY. FOR ACID REFLUX 90 capsule 1   OVER THE COUNTER MEDICATION nutrilite carb blocker as needed- helps with constipation      permethrin (ELIMITE) 5 % cream Apply topically as directed.     TRUEplus Lancets 28G MISC Use as directed 100 each 5   Facility-Administered Medications Prior to Visit  Medication Dose Route Frequency Provider Last Rate Last Admin   0.9 %  sodium chloride infusion  500 mL Intravenous Continuous Pyrtle, Lajuan Lines, MD        Allergies  Allergen Reactions   Motrin [Ibuprofen] Rash   Penicillins Rash    ROS Review of Systems  Constitutional:  Negative for chills and fever.  HENT: Negative.    Eyes:  Negative for photophobia.  Respiratory:  Negative for shortness of breath.   Cardiovascular:  Negative for chest pain.  Gastrointestinal:  Positive for abdominal pain. Negative for diarrhea, nausea and vomiting.  Endocrine: Negative.   Genitourinary: Negative.   Musculoskeletal: Negative.   Skin: Negative.   Allergic/Immunologic: Negative.   Neurological:  Positive for headaches. Negative for dizziness, seizures, speech difficulty, weakness and light-headedness.  Hematological: Negative.   Psychiatric/Behavioral: Negative.       Objective:    Physical Exam Vitals and nursing note reviewed.  Constitutional:      Appearance: She is well-developed.  HENT:     Head: Normocephalic and atraumatic.     Right Ear: Tympanic membrane, ear canal and external ear normal.     Left Ear: Tympanic membrane, ear canal and external ear normal.     Nose: Nose normal.     Mouth/Throat:     Mouth: Mucous membranes are moist.     Pharynx: Oropharynx is clear.  Eyes:     Extraocular Movements: Extraocular movements intact.     Conjunctiva/sclera: Conjunctivae normal.     Pupils: Pupils are equal, round, and reactive to light. Pupils are equal.  Cardiovascular:     Rate and Rhythm: Normal rate and regular rhythm.     Pulses: Normal pulses.     Heart sounds: Normal heart sounds.  Pulmonary:     Effort: Pulmonary effort is normal.     Breath sounds: Normal breath sounds.  Abdominal:      Palpations: Abdomen is soft.     Tenderness: There is no abdominal tenderness.  Musculoskeletal:        General: Normal range of motion.     Cervical back: Normal range of motion and neck supple.  Skin:    General: Skin is warm and dry.  Neurological:     General: No focal deficit present.     Mental  Status: She is alert and oriented to person, place, and time.     Cranial Nerves: No cranial nerve deficit or facial asymmetry.  Psychiatric:        Mood and Affect: Mood normal.        Speech: Speech normal.        Behavior: Behavior normal.        Thought Content: Thought content normal.    BP (!) 145/80 (BP Location: Right Arm, Patient Position: Sitting, Cuff Size: Normal)   Pulse 78   Temp 98.2 F (36.8 C) (Temporal)   Resp 18   Ht 5' (1.524 m)   Wt 168 lb (76.2 kg)   SpO2 98%   BMI 32.81 kg/m  Wt Readings from Last 3 Encounters:  08/25/20 168 lb (76.2 kg)  07/06/20 172 lb 8 oz (78.2 kg)  01/21/20 169 lb (76.7 kg)     Health Maintenance Due  Topic Date Due   COVID-19 Vaccine (1) Never done   OPHTHALMOLOGY EXAM  Never done   Zoster Vaccines- Shingrix (1 of 2) Never done   FOOT EXAM  11/16/2019    There are no preventive care reminders to display for this patient.  Lab Results  Component Value Date   TSH 5.910 (H) 07/30/2020   Lab Results  Component Value Date   WBC 8.7 07/30/2020   HGB 12.1 07/30/2020   HCT 37.1 07/30/2020   MCV 90 07/30/2020   PLT 231 07/30/2020   Lab Results  Component Value Date   NA 138 07/30/2020   K 4.7 07/30/2020   CO2 22 07/30/2020   GLUCOSE 205 (H) 07/30/2020   BUN 13 07/30/2020   CREATININE 0.66 07/30/2020   BILITOT 0.2 07/30/2020   ALKPHOS 104 07/30/2020   AST 18 07/30/2020   ALT 16 07/30/2020   PROT 7.3 07/30/2020   ALBUMIN 4.4 07/30/2020   CALCIUM 9.3 07/30/2020   ANIONGAP 11 07/15/2015   EGFR 99 07/30/2020   Lab Results  Component Value Date   CHOL 184 07/30/2020   Lab Results  Component Value Date   HDL 57  07/30/2020   Lab Results  Component Value Date   LDLCALC 87 07/30/2020   Lab Results  Component Value Date   TRIG 239 (H) 07/30/2020   Lab Results  Component Value Date   CHOLHDL 3.2 07/30/2020   Lab Results  Component Value Date   HGBA1C 7.6 (H) 07/30/2020      Assessment & Plan:   Problem List Items Addressed This Visit       Endocrine   Hypothyroidism   Uncontrolled type 2 diabetes mellitus with hyperglycemia, without long-term current use of insulin (HCC)   Other Visit Diagnoses     Migraine without aura and without status migrainosus, not intractable    -  Primary   Relevant Medications   SUMAtriptan (IMITREX) 50 MG tablet       Meds ordered this encounter  Medications   SUMAtriptan (IMITREX) 50 MG tablet    Sig: Take 1 tablet (50 mg total) by mouth every 2 (two) hours as needed for migraine. May repeat in 2 hours if headache persists or recurs.    Dispense:  10 tablet    Refill:  0    Order Specific Question:   Supervising Provider    Answer:   Joya Gaskins, PATRICK E [[id number]]  1. Migraine without aura and without status migrainosus, not intractable Trial Imitrex.  Patient education given on proper hydration, obtain vision exam, keep  journal of headache occurrences.  Red flags given for prompt reevaluation - SUMAtriptan (IMITREX) 50 MG tablet; Take 1 tablet (50 mg total) by mouth every 2 (two) hours as needed for migraine. May repeat in 2 hours if headache persists or recurs.  Dispense: 10 tablet; Refill: 0  2. Uncontrolled type 2 diabetes mellitus with hyperglycemia, without long-term current use of insulin [facility]) Patient had appointment with her PCP on July 30, 2020, A1c 7.6 at that time.  Patient encouraged to work on lifestyle modifications  3. Hypothyroidism, unspecified type Patient had abnormal thyroid panel on July 30, 2020, patient is taking thyroid medication on a daily basis, and is due to have thyroid levels repeated by PCP.   I have reviewed the  patient's medical history (PMH, PSH, Social History, Family History, Medications, and allergies) , and have been updated if relevant. I spent 31 minutes reviewing chart and  face to face time with patient.     Follow-up: Return if symptoms worsen or fail to improve.    Loraine Grip Mayers, PA-C

## 2020-08-25 NOTE — Progress Notes (Signed)
Patient reports a Right side HA presenting yesterday. Pain is described as throbbing and increased at night. Patient took tylenol on yesterday. Patient took chronic medications around 9 am this morning and has eaten today.

## 2020-08-27 ENCOUNTER — Other Ambulatory Visit: Payer: Self-pay

## 2020-08-31 ENCOUNTER — Ambulatory Visit: Payer: No Typology Code available for payment source

## 2020-09-04 ENCOUNTER — Other Ambulatory Visit: Payer: Self-pay

## 2020-09-04 ENCOUNTER — Ambulatory Visit: Payer: No Typology Code available for payment source

## 2020-09-04 ENCOUNTER — Ambulatory Visit: Payer: Self-pay | Attending: Nurse Practitioner

## 2020-09-07 ENCOUNTER — Other Ambulatory Visit: Payer: Self-pay

## 2020-09-16 ENCOUNTER — Ambulatory Visit: Payer: Self-pay | Attending: Nurse Practitioner

## 2020-09-16 ENCOUNTER — Other Ambulatory Visit: Payer: Self-pay

## 2020-09-16 DIAGNOSIS — E1165 Type 2 diabetes mellitus with hyperglycemia: Secondary | ICD-10-CM

## 2020-09-16 DIAGNOSIS — E039 Hypothyroidism, unspecified: Secondary | ICD-10-CM

## 2020-09-17 LAB — BASIC METABOLIC PANEL
BUN/Creatinine Ratio: 18 (ref 12–28)
BUN: 11 mg/dL (ref 8–27)
CO2: 22 mmol/L (ref 20–29)
Calcium: 9.6 mg/dL (ref 8.7–10.3)
Chloride: 99 mmol/L (ref 96–106)
Creatinine, Ser: 0.62 mg/dL (ref 0.57–1.00)
Glucose: 231 mg/dL — ABNORMAL HIGH (ref 65–99)
Potassium: 4.7 mmol/L (ref 3.5–5.2)
Sodium: 136 mmol/L (ref 134–144)
eGFR: 100 mL/min/{1.73_m2} (ref >59)

## 2020-09-17 LAB — TSH: TSH: 2.57 u[IU]/mL (ref 0.450–4.500)

## 2020-09-24 ENCOUNTER — Other Ambulatory Visit: Payer: Self-pay | Admitting: Family Medicine

## 2020-09-24 ENCOUNTER — Other Ambulatory Visit: Payer: Self-pay

## 2020-09-24 DIAGNOSIS — E1165 Type 2 diabetes mellitus with hyperglycemia: Secondary | ICD-10-CM

## 2020-09-24 MED ORDER — METFORMIN HCL 1000 MG PO TABS
ORAL_TABLET | Freq: Two times a day (BID) | ORAL | 0 refills | Status: DC
  Filled 2020-09-24: qty 60, 30d supply, fill #0
  Filled 2020-10-29: qty 60, 30d supply, fill #1

## 2020-09-25 ENCOUNTER — Other Ambulatory Visit: Payer: Self-pay

## 2020-10-02 ENCOUNTER — Other Ambulatory Visit: Payer: Self-pay

## 2020-10-29 ENCOUNTER — Other Ambulatory Visit: Payer: Self-pay | Admitting: Nurse Practitioner

## 2020-10-29 ENCOUNTER — Other Ambulatory Visit: Payer: Self-pay

## 2020-10-29 DIAGNOSIS — E039 Hypothyroidism, unspecified: Secondary | ICD-10-CM

## 2020-10-29 MED ORDER — LEVOTHYROXINE SODIUM 100 MCG PO TABS
ORAL_TABLET | ORAL | 2 refills | Status: DC
  Filled 2020-10-29: qty 30, 30d supply, fill #0
  Filled 2020-11-30: qty 30, 30d supply, fill #1
  Filled 2020-12-29: qty 30, 30d supply, fill #2

## 2020-10-30 ENCOUNTER — Other Ambulatory Visit: Payer: Self-pay

## 2020-10-30 ENCOUNTER — Ambulatory Visit: Payer: Self-pay | Attending: Nurse Practitioner | Admitting: Nurse Practitioner

## 2020-10-30 VITALS — BP 128/80 | HR 84 | Wt 171.4 lb

## 2020-10-30 DIAGNOSIS — Z23 Encounter for immunization: Secondary | ICD-10-CM

## 2020-10-30 DIAGNOSIS — E1165 Type 2 diabetes mellitus with hyperglycemia: Secondary | ICD-10-CM

## 2020-10-30 DIAGNOSIS — I10 Essential (primary) hypertension: Secondary | ICD-10-CM

## 2020-10-30 DIAGNOSIS — E039 Hypothyroidism, unspecified: Secondary | ICD-10-CM

## 2020-10-30 LAB — POCT GLYCOSYLATED HEMOGLOBIN (HGB A1C): Hemoglobin A1C: 7.4 % — AB (ref 4.0–5.6)

## 2020-10-30 LAB — GLUCOSE, POCT (MANUAL RESULT ENTRY): POC Glucose: 305 mg/dl — AB (ref 70–99)

## 2020-10-30 MED ORDER — METFORMIN HCL 1000 MG PO TABS
ORAL_TABLET | Freq: Two times a day (BID) | ORAL | 1 refills | Status: DC
  Filled 2020-11-30: qty 60, 30d supply, fill #0
  Filled 2020-12-29: qty 60, 30d supply, fill #1
  Filled 2021-01-27: qty 60, 30d supply, fill #2
  Filled 2021-02-17: qty 60, 30d supply, fill #3
  Filled 2021-03-16: qty 60, 30d supply, fill #0
  Filled 2021-04-19 (×2): qty 60, 30d supply, fill #1

## 2020-10-30 MED ORDER — GLIPIZIDE ER 10 MG PO TB24
20.0000 mg | ORAL_TABLET | Freq: Every day | ORAL | 1 refills | Status: DC
  Filled 2021-01-01: qty 60, 30d supply, fill #0

## 2020-10-30 MED ORDER — AMLODIPINE BESYLATE 5 MG PO TABS
ORAL_TABLET | Freq: Every day | ORAL | 1 refills | Status: DC
  Filled 2020-10-30: qty 30, 30d supply, fill #0

## 2020-10-30 MED ORDER — LISINOPRIL 20 MG PO TABS
ORAL_TABLET | Freq: Every day | ORAL | 1 refills | Status: DC
  Filled 2020-11-30: qty 30, 30d supply, fill #0
  Filled 2020-12-29: qty 30, 30d supply, fill #1
  Filled 2021-01-27: qty 30, 30d supply, fill #2
  Filled 2021-02-17: qty 30, 30d supply, fill #3
  Filled 2021-03-16: qty 30, 30d supply, fill #0
  Filled 2021-04-19 (×2): qty 30, 30d supply, fill #1

## 2020-10-30 NOTE — Progress Notes (Signed)
Erin Huff [id number]

## 2020-10-30 NOTE — Progress Notes (Signed)
Assessment & Plan:  Erin Huff was seen today for diabetes and hypertension.  Diagnoses and all orders for this visit:  Uncontrolled type 2 diabetes mellitus with hyperglycemia, without long-term current use of insulin (HCC) -     POCT glucose (manual entry) -     POCT glycosylated hemoglobin (Hb A1C) -     metFORMIN (GLUCOPHAGE) 1000 MG tablet; TAKE 1 TABLET (1,000 MG TOTAL) BY MOUTH 2 (TWO) TIMES DAILY WITH A MEAL. -     glipiZIDE (GLUCOTROL XL) 10 MG 24 hr tablet; Take 2 tablets (20 mg total) by mouth daily with breakfast. -     CMP14+EGFR Continue blood sugar control as discussed in office today, low carbohydrate diet, and regular physical exercise as tolerated, 150 minutes per week (30 min each day, 5 days per week, or 50 min 3 days per week). Keep blood sugar logs with fasting goal of 90-130 mg/dl, post prandial (after you eat) less than 180.  For Hypoglycemia: BS <60 and Hyperglycemia BS >400; contact the clinic ASAP. Annual eye exams and foot exams are recommended.   Primary hypertension -     amLODipine (NORVASC) 5 MG tablet; TAKE 1 TABLET (5 MG TOTAL) BY MOUTH DAILY. -     lisinopril (ZESTRIL) 20 MG tablet; TAKE 1 TABLET (20 MG TOTAL) BY MOUTH DAILY. -     CMP14+EGFR Continue all antihypertensives as prescribed.  Remember to bring in your blood pressure log with you for your follow up appointment.  DASH/Mediterranean Diets are healthier choices for HTN.    Need for immunization against influenza -     Flu Vaccine QUAD 40mo+IM (Fluarix, Fluzone & Alfiuria Quad PF)   Patient has been counseled on age-appropriate routine health concerns for screening and prevention. These are reviewed and up-to-date. Referrals have been placed accordingly. Immunizations are up-to-date or declined.    Subjective:   Chief Complaint  Patient presents with   Diabetes   Hypertension    HPI Erin Huff 65 y.o. female presents to office today for follow up to DM  and HTN.  She  has a past medical history of Allergy, Depression, Diabetes mellitus without complication, GERD, Heart murmur, Hyperlipidemia, Hypertension, and Thyroid disease.  VRI was used to communicate directly with patient for the entire encounter including providing detailed patient instructions.     She has a myriad of concerns today including. Left sided headache for which she is taking tylenol, sore throat (not taking omeprazole) which she feels is coming from air conditioner she sleeps under at night and intermittent left knee pain.    DM 2 Home readings 120-130s. Taking metformin 1000 mg BID, glipizide 20 mg daily as prescribed. Denies any symptoms of hypo or hyperglycemia. LDL not at goal with omega 3 two grams BID. Blood pressure is well controlled with amlodipine 5 mg daily and lisinopril 20 mg daily. Denies chest pain, shortness of breath, palpitations, lightheadedness, dizziness, headaches or BLE edema.   Lab Results  Component Value Date   HGBA1C 7.4 (A) 10/30/2020    Lab Results  Component Value Date   HGBA1C 7.6 (H) 07/30/2020    Lab Results  Component Value Date   LDLCALC 87 07/30/2020   BP Readings from Last 3 Encounters:  10/30/20 128/80  08/25/20 (!) 145/80  07/06/20 (!) 149/84     Hypothyroidism Well controlled with levothyroxine 100 mg daily. Asymptomatic Lab Results  Component Value Date   TSH 2.570 09/16/2020    Review of Systems  Constitutional:  Negative  for fever, malaise/fatigue and weight loss.  HENT:  Positive for sore throat. Negative for nosebleeds.   Eyes: Negative.  Negative for blurred vision, double vision and photophobia.  Respiratory: Negative.  Negative for cough and shortness of breath.   Cardiovascular: Negative.  Negative for chest pain, palpitations and leg swelling.  Gastrointestinal: Negative.  Negative for heartburn, nausea and vomiting.  Musculoskeletal:  Positive for myalgias (left sided pain).  Neurological:  Positive for headaches.  Negative for dizziness, focal weakness and seizures.  Psychiatric/Behavioral: Negative.  Negative for suicidal ideas.  Asymptomatic.   Past Medical History:  Diagnosis Date   Allergy    Depression    mild   Diabetes mellitus without complication (HCC)    GERD (gastroesophageal reflux disease)    Heart murmur    Hyperlipidemia    Hypertension    Thyroid disease     Past Surgical History:  Procedure Laterality Date   NO PAST SURGERIES      Family History  Problem Relation Age of Onset   Hypertension Sister    Diabetes Brother    Breast cancer Maternal Aunt    Colon cancer Neg Hx    Colon polyps Neg Hx    Esophageal cancer Neg Hx    Rectal cancer Neg Hx    Stomach cancer Neg Hx     Social History Reviewed with no changes to be made today.   Outpatient Medications Prior to Visit  Medication Sig Dispense Refill   Blood Glucose Monitoring Suppl (TRUE METRIX METER) w/Device KIT Use as directed 1 kit 0   glucose blood test strip USE AS INSTRUCTED. 100 strip 12   levothyroxine (SYNTHROID) 100 MCG tablet TAKE 1 TABLET (100 MCG TOTAL) BY MOUTH DAILY BEFORE BREAKFAST. 30 tablet 2   loratadine (CLARITIN) 10 MG tablet Take 1 tablet (10 mg total) by mouth daily. 90 tablet 3   Multiple Vitamin (MULTIVITAMIN) tablet Take 1 tablet by mouth daily.     omega-3 acid ethyl esters (LOVAZA) 1 g capsule TAKE 2 CAPSULES (2 G TOTAL) BY MOUTH 2 (TWO) TIMES DAILY. 360 capsule 1   omeprazole (PRILOSEC) 20 MG capsule TAKE 1 CAPSULE (20 MG TOTAL) BY MOUTH DAILY. FOR ACID REFLUX 90 capsule 1   OVER THE COUNTER MEDICATION nutrilite carb blocker as needed- helps with constipation     SUMAtriptan (IMITREX) 50 MG tablet Take 1 tablet (50 mg total) by mouth every 2 (two) hours as needed for migraine. May repeat in 2 hours if headache persists or recurs. 10 tablet 0   TRUEplus Lancets 28G MISC Use as directed 100 each 5   glipiZIDE (GLUCOTROL XL) 10 MG 24 hr tablet Take 2 tablets (20 mg total) by mouth daily  with breakfast. 180 tablet 0   lisinopril (ZESTRIL) 20 MG tablet TAKE 1 TABLET (20 MG TOTAL) BY MOUTH DAILY. 90 tablet 1   Menthol (CEPACOL SORE THROAT) 5.4 MG LOZG Take daily by mouth as needed 30 lozenge 0   metFORMIN (GLUCOPHAGE) 1000 MG tablet TAKE 1 TABLET (1,000 MG TOTAL) BY MOUTH 2 (TWO) TIMES DAILY WITH A MEAL. 180 tablet 0   permethrin (ELIMITE) 5 % cream Apply topically as directed.     amLODipine (NORVASC) 5 MG tablet TAKE 1 TABLET (5 MG TOTAL) BY MOUTH DAILY. 90 tablet 1   Facility-Administered Medications Prior to Visit  Medication Dose Route Frequency Provider Last Rate Last Admin   0.9 %  sodium chloride infusion  500 mL Intravenous Continuous Pyrtle, Lajuan Lines, MD  Allergies  Allergen Reactions   Motrin [Ibuprofen] Rash   Penicillins Rash       Objective:    BP 128/80   Pulse 84   Wt 171 lb 6 oz (77.7 kg)   SpO2 96%   BMI 33.47 kg/m  Wt Readings from Last 3 Encounters:  10/30/20 171 lb 6 oz (77.7 kg)  08/25/20 168 lb (76.2 kg)  07/06/20 172 lb 8 oz (78.2 kg)    Physical Exam Vitals and nursing note reviewed.  Constitutional:      Appearance: She is well-developed.  HENT:     Head: Normocephalic and atraumatic.     Mouth/Throat:     Lips: Pink.     Mouth: Mucous membranes are moist.     Pharynx: Oropharynx is clear.     Tonsils: No tonsillar exudate or tonsillar abscesses.  Cardiovascular:     Rate and Rhythm: Normal rate and regular rhythm.     Heart sounds: Normal heart sounds. No murmur heard.   No friction rub. No gallop.  Pulmonary:     Effort: Pulmonary effort is normal. No tachypnea or respiratory distress.     Breath sounds: Normal breath sounds. No decreased breath sounds, wheezing, rhonchi or rales.  Chest:     Chest wall: No tenderness.  Abdominal:     General: Bowel sounds are normal.     Palpations: Abdomen is soft.  Musculoskeletal:        General: Normal range of motion.     Cervical back: Normal range of motion.  Skin:     General: Skin is warm and dry.  Neurological:     Mental Status: She is alert and oriented to person, place, and time.     Coordination: Coordination normal.  Psychiatric:        Behavior: Behavior normal. Behavior is cooperative.        Thought Content: Thought content normal.        Judgment: Judgment normal.         Patient has been counseled extensively about nutrition and exercise as well as the importance of adherence with medications and regular follow-up. The patient was given clear instructions to go to ER or return to medical center if symptoms don't improve, worsen or new problems develop. The patient verbalized understanding.   Follow-up: Return for tele visit any tuesday in October/Left side pain. See me in 3 months for DM. Marland Kitchen   Gildardo Pounds, FNP-BC [facility] and [facility] [place], [place]   10/31/2020, 11:07 PM

## 2020-10-31 ENCOUNTER — Encounter: Payer: Self-pay | Admitting: Nurse Practitioner

## 2020-10-31 LAB — CMP14+EGFR
ALT: 21 IU/L (ref 0–32)
AST: 30 IU/L (ref 0–40)
Albumin/Globulin Ratio: 1.8 (ref 1.2–2.2)
Albumin: 4.7 g/dL (ref 3.8–4.8)
Alkaline Phosphatase: 108 IU/L (ref 44–121)
BUN/Creatinine Ratio: 17 (ref 12–28)
BUN: 11 mg/dL (ref 8–27)
Bilirubin Total: 0.3 mg/dL (ref 0.0–1.2)
CO2: 22 mmol/L (ref 20–29)
Calcium: 9.6 mg/dL (ref 8.7–10.3)
Chloride: 96 mmol/L (ref 96–106)
Creatinine, Ser: 0.66 mg/dL (ref 0.57–1.00)
Globulin, Total: 2.6 g/dL (ref 1.5–4.5)
Glucose: 261 mg/dL — ABNORMAL HIGH (ref 65–99)
Potassium: 4.5 mmol/L (ref 3.5–5.2)
Sodium: 134 mmol/L (ref 134–144)
Total Protein: 7.3 g/dL (ref 6.0–8.5)
eGFR: 99 mL/min/{1.73_m2} (ref >59)

## 2020-11-11 ENCOUNTER — Other Ambulatory Visit: Payer: Self-pay

## 2020-11-13 ENCOUNTER — Encounter (INDEPENDENT_AMBULATORY_CARE_PROVIDER_SITE_OTHER): Payer: Self-pay

## 2020-11-13 ENCOUNTER — Telehealth: Payer: Self-pay | Admitting: Nurse Practitioner

## 2020-11-13 ENCOUNTER — Other Ambulatory Visit: Payer: Self-pay

## 2020-11-13 NOTE — Telephone Encounter (Signed)
PT inquired about results. Advised to have my chart

## 2020-11-16 NOTE — Telephone Encounter (Signed)
Called pt and left vm to return the call to the office. Translator Nathaniel Man [phone])

## 2020-11-16 NOTE — Telephone Encounter (Signed)
Will forward to nurse 

## 2020-11-19 NOTE — Telephone Encounter (Signed)
Called Pt and informed her of her lab results. She repeated back and said she understood. Use Translator Leward Quan [phone])

## 2020-11-30 ENCOUNTER — Other Ambulatory Visit: Payer: Self-pay

## 2020-12-01 ENCOUNTER — Other Ambulatory Visit: Payer: Self-pay

## 2020-12-01 ENCOUNTER — Telehealth: Payer: Self-pay | Admitting: Nurse Practitioner

## 2020-12-01 ENCOUNTER — Ambulatory Visit: Payer: Self-pay | Attending: Nurse Practitioner | Admitting: Nurse Practitioner

## 2020-12-01 NOTE — Telephone Encounter (Signed)
Assisted by interpreter. No answer. LVM

## 2020-12-04 ENCOUNTER — Other Ambulatory Visit: Payer: Self-pay

## 2020-12-29 ENCOUNTER — Other Ambulatory Visit: Payer: Self-pay

## 2020-12-30 ENCOUNTER — Other Ambulatory Visit: Payer: Self-pay

## 2021-01-01 ENCOUNTER — Ambulatory Visit: Payer: Self-pay | Attending: Nurse Practitioner | Admitting: Nurse Practitioner

## 2021-01-01 ENCOUNTER — Encounter: Payer: Self-pay | Admitting: Nurse Practitioner

## 2021-01-01 ENCOUNTER — Other Ambulatory Visit: Payer: Self-pay

## 2021-01-01 VITALS — BP 127/73 | HR 74 | Ht 60.0 in | Wt 175.5 lb

## 2021-01-01 DIAGNOSIS — K219 Gastro-esophageal reflux disease without esophagitis: Secondary | ICD-10-CM

## 2021-01-01 DIAGNOSIS — I10 Essential (primary) hypertension: Secondary | ICD-10-CM

## 2021-01-01 DIAGNOSIS — M2550 Pain in unspecified joint: Secondary | ICD-10-CM

## 2021-01-01 DIAGNOSIS — E039 Hypothyroidism, unspecified: Secondary | ICD-10-CM

## 2021-01-01 MED ORDER — LEVOTHYROXINE SODIUM 100 MCG PO TABS
ORAL_TABLET | ORAL | 2 refills | Status: DC
  Filled 2021-01-01: qty 30, fill #0
  Filled 2021-01-27: qty 30, 30d supply, fill #0
  Filled 2021-02-17: qty 30, 30d supply, fill #1
  Filled 2021-03-16: qty 30, 30d supply, fill #0

## 2021-01-01 MED ORDER — OMEPRAZOLE 20 MG PO CPDR
20.0000 mg | DELAYED_RELEASE_CAPSULE | Freq: Every day | ORAL | 1 refills | Status: DC | PRN
Start: 2021-01-01 — End: 2022-02-16
  Filled 2021-01-01 – 2021-07-23 (×2): qty 30, 30d supply, fill #0

## 2021-01-01 MED ORDER — AMLODIPINE BESYLATE 5 MG PO TABS
5.0000 mg | ORAL_TABLET | Freq: Every day | ORAL | 1 refills | Status: DC
Start: 2021-01-01 — End: 2021-05-12
  Filled 2021-01-01 – 2021-05-03 (×2): qty 30, 30d supply, fill #0

## 2021-01-01 MED ORDER — DICLOFENAC SODIUM 1 % EX GEL
4.0000 g | Freq: Four times a day (QID) | CUTANEOUS | 1 refills | Status: AC
  Filled 2021-01-01: qty 200, 12d supply, fill #0

## 2021-01-01 NOTE — Progress Notes (Signed)
Assessment & Plan:  Erin Huff was seen today for foot pain.  Diagnoses and all orders for this visit:  Arthralgia of multiple joints -     diclofenac Sodium (VOLTAREN) 1 % GEL; Apply 4 g topically 4 (four) times daily.  Acquired hypothyroidism -     levothyroxine (SYNTHROID) 100 MCG tablet; TAKE 1 TABLET (100 MCG TOTAL) BY MOUTH DAILY BEFORE BREAKFAST. -     Thyroid Panel With TSH  Primary hypertension -     amLODipine (NORVASC) 5 MG tablet; Take 1 tablet (5 mg total) by mouth daily. para la presin arterial  Gastroesophageal reflux disease without esophagitis -     omeprazole (PRILOSEC) 20 MG capsule; Take 1 capsule (20 mg total) by mouth daily as needed. PARA ACIDO   Patient has been counseled on age-appropriate routine health concerns for screening and prevention. These are reviewed and up-to-date. Referrals have been placed accordingly. Immunizations are up-to-date or declined.    Subjective:   Chief Complaint  Patient presents with   Foot Pain   HPI Erin Huff 65 y.o. female presents to office today with complaints of intermittent left leg pain.   Endorses multiple joint arthralgias including her bilateral shoulders, left leg and lower back. Taking OTC tylenol only provides minimal relief of pain.    Blood pressure is well controlled. She is taking amlodipine 5 mg daily and lisinopril 20 mg daily as prescribed.  BP Readings from Last 3 Encounters:  01/01/21 127/73  10/30/20 128/80  08/25/20 (!) 145/80     Review of Systems  Constitutional:  Negative for fever, malaise/fatigue and weight loss.  HENT: Negative.  Negative for nosebleeds.   Eyes: Negative.  Negative for blurred vision, double vision and photophobia.  Respiratory: Negative.  Negative for cough and shortness of breath.   Cardiovascular: Negative.  Negative for chest pain, palpitations and leg swelling.  Gastrointestinal:  Positive for heartburn. Negative for nausea and vomiting.   Musculoskeletal: Negative.  Negative for myalgias.  Neurological: Negative.  Negative for dizziness, focal weakness, seizures and headaches.  Psychiatric/Behavioral: Negative.  Negative for suicidal ideas.    Past Medical History:  Diagnosis Date   Allergy    Depression    mild   Diabetes mellitus without complication (HCC)    GERD (gastroesophageal reflux disease)    Heart murmur    Hyperlipidemia    Hypertension    Thyroid disease     Past Surgical History:  Procedure Laterality Date   NO PAST SURGERIES      Family History  Problem Relation Age of Onset   Hypertension Sister    Diabetes Brother    Breast cancer Maternal Aunt    Colon cancer Neg Hx    Colon polyps Neg Hx    Esophageal cancer Neg Hx    Rectal cancer Neg Hx    Stomach cancer Neg Hx     Social History Reviewed with no changes to be made today.   Outpatient Medications Prior to Visit  Medication Sig Dispense Refill   Blood Glucose Monitoring Suppl (TRUE METRIX METER) w/Device KIT Use as directed 1 kit 0   glipiZIDE (GLUCOTROL XL) 10 MG 24 hr tablet Take 2 tablets (20 mg total) by mouth daily with breakfast. 180 tablet 1   glucose blood test strip USE AS INSTRUCTED. 100 strip 12   lisinopril (ZESTRIL) 20 MG tablet TAKE 1 TABLET (20 MG TOTAL) BY MOUTH DAILY. 90 tablet 1   loratadine (CLARITIN) 10 MG tablet Take 1 tablet (  10 mg total) by mouth daily. 90 tablet 3   metFORMIN (GLUCOPHAGE) 1000 MG tablet TAKE 1 TABLET (1,000 MG TOTAL) BY MOUTH 2 (TWO) TIMES DAILY WITH A MEAL. 180 tablet 1   Multiple Vitamin (MULTIVITAMIN) tablet Take 1 tablet by mouth daily.     omega-3 acid ethyl esters (LOVAZA) 1 g capsule TAKE 2 CAPSULES (2 G TOTAL) BY MOUTH 2 (TWO) TIMES DAILY. 360 capsule 1   OVER THE COUNTER MEDICATION nutrilite carb blocker as needed- helps with constipation     SUMAtriptan (IMITREX) 50 MG tablet Take 1 tablet (50 mg total) by mouth every 2 (two) hours as needed for migraine. May repeat in 2 hours if  headache persists or recurs. 10 tablet 0   TRUEplus Lancets 28G MISC Use as directed 100 each 5   amLODipine (NORVASC) 5 MG tablet TAKE 1 TABLET (5 MG TOTAL) BY MOUTH DAILY. 90 tablet 1   levothyroxine (SYNTHROID) 100 MCG tablet TAKE 1 TABLET (100 MCG TOTAL) BY MOUTH DAILY BEFORE BREAKFAST. 30 tablet 2   omeprazole (PRILOSEC) 20 MG capsule TAKE 1 CAPSULE (20 MG TOTAL) BY MOUTH DAILY. FOR ACID REFLUX 90 capsule 1   Facility-Administered Medications Prior to Visit  Medication Dose Route Frequency Provider Last Rate Last Admin   0.9 %  sodium chloride infusion  500 mL Intravenous Continuous Pyrtle, Lajuan Lines, MD        Allergies  Allergen Reactions   Motrin [Ibuprofen] Rash   Penicillins Rash       Objective:    BP 127/73   Pulse 74   Ht 5' (1.524 m)   Wt 175 lb 8 oz (79.6 kg)   SpO2 98%   BMI 34.27 kg/m  Wt Readings from Last 3 Encounters:  01/01/21 175 lb 8 oz (79.6 kg)  10/30/20 171 lb 6 oz (77.7 kg)  08/25/20 168 lb (76.2 kg)    Physical Exam Vitals and nursing note reviewed.  Constitutional:      Appearance: She is well-developed.  HENT:     Head: Normocephalic and atraumatic.  Cardiovascular:     Rate and Rhythm: Normal rate and regular rhythm.     Heart sounds: Normal heart sounds. No murmur heard.   No friction rub. No gallop.  Pulmonary:     Effort: Pulmonary effort is normal. No tachypnea or respiratory distress.     Breath sounds: Normal breath sounds. No decreased breath sounds, wheezing, rhonchi or rales.  Chest:     Chest wall: No tenderness.  Abdominal:     General: Bowel sounds are normal.     Palpations: Abdomen is soft.  Musculoskeletal:        General: Normal range of motion.     Cervical back: Normal range of motion.  Skin:    General: Skin is warm and dry.  Neurological:     Mental Status: She is alert and oriented to person, place, and time.     Coordination: Coordination normal.  Psychiatric:        Behavior: Behavior normal. Behavior is  cooperative.        Thought Content: Thought content normal.        Judgment: Judgment normal.         Patient has been counseled extensively about nutrition and exercise as well as the importance of adherence with medications and regular follow-up. The patient was given clear instructions to go to ER or return to medical center if symptoms don't improve, worsen or new problems develop.  The patient verbalized understanding.   Follow-up: Return in about 6 weeks (around 02/12/2021) for A1C.   Gildardo Pounds, FNP-BC [facility] and [facility] [place], [place]   01/01/2021, 12:56 PM

## 2021-01-02 LAB — THYROID PANEL WITH TSH
Free Thyroxine Index: 2.8 (ref 1.2–4.9)
T3 Uptake Ratio: 27 % (ref 24–39)
T4, Total: 10.2 ug/dL (ref 4.5–12.0)
TSH: 3.28 u[IU]/mL (ref 0.450–4.500)

## 2021-01-08 ENCOUNTER — Telehealth: Payer: Self-pay

## 2021-01-08 NOTE — Telephone Encounter (Signed)
-----   Message from Claiborne Rigg, NP sent at 01/08/2021  8:55 AM EST ----- Normal thyroid level.  Repeat in 6 months.

## 2021-01-08 NOTE — Telephone Encounter (Signed)
Called patient reviewed all information and repeated back to me. Will call if any questions.  With the help of interpretor Jaci Carrel id #[id number]

## 2021-01-08 NOTE — Progress Notes (Signed)
Called patient reviewed all information and repeated back to me. Will call if any questions.  With the help of interpretor Jaci Carrel id #[id number]

## 2021-01-08 NOTE — Progress Notes (Signed)
Called patient reviewed all information and repeated back to me. Will call if any questions.  With the help of interpretor Rodrigo id #[id number] 

## 2021-01-18 ENCOUNTER — Other Ambulatory Visit: Payer: Self-pay

## 2021-01-25 ENCOUNTER — Ambulatory Visit
Admission: RE | Admit: 2021-01-25 | Discharge: 2021-01-25 | Disposition: A | Discharge status: Home / Self Care | Payer: No Typology Code available for payment source | Source: Ambulatory Visit | Attending: Nurse Practitioner | Admitting: Nurse Practitioner

## 2021-01-25 ENCOUNTER — Other Ambulatory Visit: Payer: Self-pay | Admitting: Nurse Practitioner

## 2021-01-25 DIAGNOSIS — R011 Cardiac murmur, unspecified: Secondary | ICD-10-CM

## 2021-01-28 ENCOUNTER — Other Ambulatory Visit: Payer: Self-pay

## 2021-01-29 ENCOUNTER — Telehealth: Payer: Self-pay | Admitting: Nurse Practitioner

## 2021-01-29 ENCOUNTER — Ambulatory Visit: Payer: No Typology Code available for payment source | Admitting: Nurse Practitioner

## 2021-01-29 NOTE — Telephone Encounter (Signed)
Called and gave pt number for the mammography scholarship fund [phone]

## 2021-01-29 NOTE — Telephone Encounter (Signed)
Copied from CRM [id number]. Topic: General - Call Back - No Documentation >> Jan 26, 2021  3:43 PM Randol Kern wrote: Reason for CRM: Pt has questions about scheduling her mammogram, please advise  Best contact: [phone]

## 2021-02-03 ENCOUNTER — Telehealth: Payer: Self-pay

## 2021-02-03 NOTE — Telephone Encounter (Signed)
NOTES SCANNED TO REFERRAL 

## 2021-02-10 ENCOUNTER — Other Ambulatory Visit: Payer: Self-pay

## 2021-02-10 ENCOUNTER — Ambulatory Visit: Payer: Self-pay | Attending: Nurse Practitioner | Admitting: Pharmacist

## 2021-02-10 ENCOUNTER — Encounter: Payer: Self-pay | Admitting: Pharmacist

## 2021-02-10 DIAGNOSIS — E7800 Pure hypercholesterolemia, unspecified: Secondary | ICD-10-CM

## 2021-02-10 DIAGNOSIS — E1165 Type 2 diabetes mellitus with hyperglycemia: Secondary | ICD-10-CM

## 2021-02-10 LAB — POCT GLYCOSYLATED HEMOGLOBIN (HGB A1C): HbA1c, POC (controlled diabetic range): 7.3 % — AB (ref 0.0–7.0)

## 2021-02-10 LAB — GLUCOSE, POCT (MANUAL RESULT ENTRY): POC Glucose: 210 mg/dl — AB (ref 70–99)

## 2021-02-10 MED ORDER — EMPAGLIFLOZIN 10 MG PO TABS
10.0000 mg | ORAL_TABLET | Freq: Every day | ORAL | 2 refills | Status: DC
Start: 2021-02-10 — End: 2021-05-12
  Filled 2021-02-10: qty 30, 30d supply, fill #0

## 2021-02-10 MED ORDER — ROSUVASTATIN CALCIUM 5 MG PO TABS
5.0000 mg | ORAL_TABLET | Freq: Every day | ORAL | 2 refills | Status: DC
  Filled 2021-02-10 – 2021-03-15 (×2): qty 30, 30d supply, fill #0
  Filled 2021-03-15 – 2021-05-03 (×2): qty 30, 30d supply, fill #1

## 2021-02-10 MED ORDER — GLIPIZIDE ER 10 MG PO TB24
20.0000 mg | ORAL_TABLET | Freq: Every day | ORAL | 1 refills | Status: DC
  Filled 2021-02-10 – 2021-03-15 (×2): qty 60, 30d supply, fill #0
  Filled 2021-03-15 – 2021-06-04 (×2): qty 60, 30d supply, fill #1
  Filled 2021-07-21: qty 60, 30d supply, fill #2
  Filled 2021-09-02: qty 60, 30d supply, fill #3

## 2021-02-10 NOTE — Progress Notes (Signed)
° °  S:    PCP: [name]  Patient presents for diabetes evaluation, education, and management. Patient was referred and last seen by Primary Care Provider on 01/01/21. Due for A1c check today.   Family/Social History:  -Never smoker -HTN in sister, DM in brother  Insurance coverage/medication affordability: self pay  Medication adherence reported.   Current diabetes medications include: metformin 1000 mg BID, glipizide XL 10 mg daily Current hypertension medications include: amlodipine 5 mg daily, lisinopril 20 mg daily Current hyperlipidemia medications include: none  Patient denies hypoglycemic events.  Patient reported dietary habits: Eats 3 meals/day - morning, noon, and afternoon. Loves eating tortillas - "cannot resist eating them."  Patient-reported exercise habits: previously doing Zumba, hasn't done it recently  Patient reports nocturia (nighttime urination). 2x/night Patient denies neuropathy (nerve pain).  Patient denies visual changes.  Patient reports self foot exams.   O:  POCT BG: 210 (1hPP - ate a quesadilla)  Lab Results  Component Value Date   HGBA1C 7.3 (A) 02/10/2021   There were no vitals filed for this visit.  Lipid Panel     Component Value Date/Time   CHOL 184 07/30/2020 1642   TRIG 239 (H) 07/30/2020 1642   HDL 57 07/30/2020 1642   CHOLHDL 3.2 07/30/2020 1642   CHOLHDL 3.7 03/01/2016 0956   VLDL 47 (H) 03/01/2016 0956   LDLCALC 87 07/30/2020 1642   Checks BG once daily in the morning before breakfast Home fasting blood sugars: 120, 140, highest 150  Clinical Atherosclerotic Cardiovascular Disease (ASCVD): No  The 10-year ASCVD risk score (Arnett DK, et al., 2019) is: 10.5%   Values used to calculate the score:     Age: 65 years     Sex: Female     Is Non-Hispanic African American: No     Diabetic: Yes     Tobacco smoker: No     Systolic Blood Pressure: 127 mmHg     Is BP treated: Yes     HDL Cholesterol: 57 mg/dL     Total Cholesterol:  184 mg/dL   A/P: Diabetes longstanding currently uncontrolled with most recent A1c 7.3 today. Patient is able to verbalize appropriate hypoglycemia management plan. Medication adherence appears appropriate. Control is suboptimal due to patient reported dietary indiscretions. -Start Jardiance 10 mg daily -Continue metformin 1000 mg BID and glipizide XL 10 mg daily  -Extensively discussed pathophysiology of diabetes, recommended lifestyle interventions, dietary effects on blood sugar control -Counseled on s/sx of and management of hypoglycemia -Next A1C anticipated 04/2021.   Patient qualifies for moderate-intensity statin use given age 23-72 years old and DM. She previously took atorvastatin and said it made her feel bad. Will try an alternative statin at a low dose to see if she can tolerate this better.  -Start rosuvastatin 5 mg daily -Check fasting lipid panel in 4-12 weeks - at next visit  Total time in face to face counseling 30 minutes. Follow up Pharmacist Clinic Visit in 1 month, PCP visit in 2 months.  Pervis Hocking, PharmD PGY2 Ambulatory Care Pharmacy Resident 02/10/2021 4:26 PM

## 2021-02-17 ENCOUNTER — Other Ambulatory Visit: Payer: Self-pay

## 2021-02-17 ENCOUNTER — Ambulatory Visit: Payer: No Typology Code available for payment source | Admitting: Nurse Practitioner

## 2021-02-26 ENCOUNTER — Other Ambulatory Visit: Payer: Self-pay

## 2021-02-26 ENCOUNTER — Other Ambulatory Visit (HOSPITAL_COMMUNITY): Payer: Self-pay

## 2021-03-04 ENCOUNTER — Ambulatory Visit: Payer: No Typology Code available for payment source | Admitting: Internal Medicine

## 2021-03-11 ENCOUNTER — Other Ambulatory Visit: Payer: Self-pay

## 2021-03-11 NOTE — Progress Notes (Signed)
S:    PCP: [name]  Patient presents for diabetes evaluation, education, and management. Patient was referred and last seen by Primary Care Provider on 01/01/21. Seen by CPP 02/10/21 and started on Jardiance 10 mg and rosuvastatin 5 mg.    Today, patient arrives in good spirits and visit was completed with use of a Spanish video interpreter. She reports she has been tolerating rosuvastatin well but she ran out of it yesterday. She also ran out of her glipizide today. She reports she has not been taking Jardiance because she has not been drinking a lot of water. However she is drinking 4-5 bottles of water per day.   Family/Social History:  -Never smoker -HTN in sister, DM in brother  Insurance coverage/medication affordability: self pay  Medication adherence reported.   Current diabetes medications include: metformin 1000 mg BID, glipizide XL 10 mg daily, Jardiance 10 mg daily (has not been taking) Current hypertension medications include: amlodipine 5 mg daily, lisinopril 20 mg daily Current hyperlipidemia medications include: rosuvastatin 5 mg daily  Patient denies hypoglycemic events.  Patient reported dietary habits: Eats 3 meals/day - morning, noon, and afternoon. Loves eating tortillas - "cannot resist eating them." Reports eating more vegetables and whole grains. Tries to avoid meat.  Patient-reported exercise habits: started back at [employer]  Patient reports nocturia (nighttime urination). 1x/night Patient denies neuropathy (nerve pain).  Patient denies visual changes.  Patient reports self foot exams.   O:   Lab Results  Component Value Date   HGBA1C 7.3 (A) 02/10/2021   Lipid Panel     Component Value Date/Time   CHOL 184 07/30/2020 1642   TRIG 239 (H) 07/30/2020 1642   HDL 57 07/30/2020 1642   CHOLHDL 3.2 07/30/2020 1642   CHOLHDL 3.7 03/01/2016 0956   VLDL 47 (H) 03/01/2016 0956   LDLCALC 87 07/30/2020 1642   Checks BG once daily in the morning before  breakfast Home fasting blood sugars: mostly 130-143, lowest 120, highest 160 when felt sick  Clinical Atherosclerotic Cardiovascular Disease (ASCVD): No  The 10-year ASCVD risk score (Arnett DK, et al., 2019) is: 11.7%   Values used to calculate the score:     Age: 66 years     Sex: Female     Is Non-Hispanic African American: No     Diabetic: Yes     Tobacco smoker: No     Systolic Blood Pressure: [id number]     Is BP treated: Yes     HDL Cholesterol: 57 mg/dL     Total Cholesterol: 184 mg/dL   A/P: Diabetes longstanding currently uncontrolled with most recent A1c 7.3 today. Patient is able to verbalize appropriate hypoglycemia management plan. Medication adherence appears appropriate except she has not started Jardiance due to confusion. Explained that she is drinking plenty of water in a day to take Jardiance. Explained how to obtain refills from the pharmacy of her medications she has run out of.  -Start Jardiance 10 mg daily -Continue metformin 1000 mg BID and glipizide XL 10 mg daily  -Extensively discussed pathophysiology of diabetes, recommended lifestyle interventions, dietary effects on blood sugar control -Counseled on s/sx of and management of hypoglycemia -Next A1C anticipated 04/2021.   Patient qualifies for moderate-intensity statin use given age 14-18 years old and DM. She previously took atorvastatin and said it made her feel bad. Will try an alternative statin at a low dose to see if she can tolerate this better.  -Continue rosuvastatin 5 mg daily -Check fasting  lipid panel in 4-12 weeks - at next PCP visit  Total time in face to face counseling 30 minutes. Follow up PCP visit 04/14/21.   Rebbeca Paul, PharmD PGY2 Ambulatory Care Pharmacy Resident 03/11/2021 1:09 PM

## 2021-03-15 ENCOUNTER — Other Ambulatory Visit: Payer: Self-pay

## 2021-03-15 ENCOUNTER — Ambulatory Visit: Payer: Self-pay | Attending: Nurse Practitioner | Admitting: Pharmacist

## 2021-03-15 DIAGNOSIS — E1165 Type 2 diabetes mellitus with hyperglycemia: Secondary | ICD-10-CM

## 2021-03-16 ENCOUNTER — Other Ambulatory Visit: Payer: Self-pay

## 2021-03-16 ENCOUNTER — Ambulatory Visit: Payer: Self-pay | Attending: Nurse Practitioner

## 2021-03-22 ENCOUNTER — Encounter: Payer: Self-pay | Admitting: Cardiology

## 2021-03-22 ENCOUNTER — Ambulatory Visit (INDEPENDENT_AMBULATORY_CARE_PROVIDER_SITE_OTHER): Payer: Self-pay | Admitting: Cardiology

## 2021-03-22 ENCOUNTER — Other Ambulatory Visit: Payer: Self-pay

## 2021-03-22 VITALS — BP 144/76 | HR 92 | Ht 60.0 in | Wt 173.0 lb

## 2021-03-22 DIAGNOSIS — E1169 Type 2 diabetes mellitus with other specified complication: Secondary | ICD-10-CM

## 2021-03-22 DIAGNOSIS — E785 Hyperlipidemia, unspecified: Secondary | ICD-10-CM

## 2021-03-22 DIAGNOSIS — R9431 Abnormal electrocardiogram [ECG] [EKG]: Secondary | ICD-10-CM

## 2021-03-22 DIAGNOSIS — I10 Essential (primary) hypertension: Secondary | ICD-10-CM

## 2021-03-22 DIAGNOSIS — R011 Cardiac murmur, unspecified: Secondary | ICD-10-CM

## 2021-03-22 NOTE — Progress Notes (Signed)
Primary Care Provider: Claiborne Rigg, NP [facility] Cardiologist: Bryan Lemma, MD Electrophysiologist: None  Clinic Note: Chief Complaint  Patient presents with   New Patient (Initial Visit)    murmur   ===================================  ASSESSMENT/PLAN   Problem List Items Addressed This Visit       Cardiology Problems   Hyperlipidemia associated with type 2 diabetes mellitus (HCC) (Chronic)    Last A1c was 7.3.  Lipid panel not all that bad just elevated triglycerides-likely related to hyperglycemia.  LDL is 87.  Managed by PCP.  On low-dose rosuvastatin along with Lovaza for lipids.  On metformin, Jardiance, and glipizide for diabetes..  We did not talk about cardiovascular risk assessment at this time, however can discuss in follow-up.  May consider Coronary Calcium Score in.      Relevant Orders   EKG 12-Lead (Completed)   ECHOCARDIOGRAM COMPLETE   Essential hypertension (Chronic)    Blood pressure is little high today, but she does note that she is a bit stressed. Can reassess in follow-up.  She is on amlodipine and lisinopril. Not on diuretic which would be an appropriate addition to lisinopril if additional blood pressure control required..      Relevant Orders   EKG 12-Lead (Completed)   ECHOCARDIOGRAM COMPLETE     Other   Nonspecific abnormal electrocardiogram (ECG) (EKG)    EKG reviewed from referring provider is relatively poor copy.  There is sinus tachycardia--101 bpm.  Nondiagnostic/nonpathologic Q waves in lead III with some nonspecific ST-T wave changes.  Relatively benign  EKG here is essentially normal.      CARDIAC MURMUR - Primary (Chronic)    Soft murmur heard on exam.  She said that this is occurred in the past, but not mentioned in some time.  Murmur sounds like it is aortic in nature, however could potentially be mitral.  There is also concern about potential structural abnormalities.  Plan: Check 2D echocardiogram       Relevant Orders   EKG 12-Lead (Completed)   ECHOCARDIOGRAM COMPLETE   ===================================  HPI:    Erin Huff is a 66 y.o. female with PMH notable for uncontrolled DM-2, HTN, and HLD who is being seen today for the evaluation of Abnormal EKG / Tachycardia & Reported H/o Murmur at the request of Willaim Rayas, NP.  Erin Huff was last seen on 01/29/2021 by Charma Igo, NP At [facility] with complaints of hoarse voice, cough, fevers and malaise concerning for possible viral syndrome versus allergies.  Symptoms are going on for 5 days. ->  Murmur was noted on exam, and EKG showed sinus tachycardia.  (The provided EKG is read as SVT, but it is Sinus Tachycardia).   Recent Hospitalizations: None  Reviewed  CV studies:    The following studies were reviewed today: (if available, images/films reviewed: From Epic Chart or Care Everywhere) None:   Interval History:   Erin Huff presents here today with her daughter and Spanish interpreter who assists with the history taking.  She was not quite sure of the reason why she was referred.  She then recalled that there was mention of either an abnormal EKG or a murmur.  She basically indicates that she went to [facility] with complaints of a cold basically.  She had a fever feeling poorly for couple days.  Had some nausea and Was not drinking well.  She felt that her heart rate go fast at times.  But this is not  the only time she is noted that.  Otherwise, she is doing relatively well with no major issues.  She cannot recall having been told that she had rheumatic fever.  She did have measles and potentially malaria.  She was told that she had a murmur on exam.  At baseline, she does pretty well.  She is quite active.  Does Zumba exercises.  Has not had any issues -> prior to feeling sick, She had no chest pain or exertional dyspnea.  No PND orthopnea.  CV  Review of Symptoms (Summary) Cardiovascular ROS: no chest pain or dyspnea on exertion positive for - -occasional issues with fast heart rates when not feeling well. negative for - edema, orthopnea, palpitations, paroxysmal nocturnal dyspnea, shortness of breath, or lightheadedness, dizziness or wooziness, syncope/near syncope or TIA/amaurosis fugax, claudication  REVIEWED OF SYSTEMS   Review of Systems  Constitutional:  Negative for malaise/fatigue (Getting back to full energy level after recovering from cold) and weight loss.       No longer having the aftereffects of her cold.  Probably recovering.  HENT:  Negative for congestion (Resolved).   Respiratory:         Recovered from cold-no more cough or sneezing.  Cardiovascular:        Negative per HPI  Gastrointestinal:  Positive for abdominal pain (Some left lower quadrant pain). Negative for blood in stool and melena.  Genitourinary:  Negative for hematuria.  Musculoskeletal: Negative.   Neurological:  Negative for dizziness, focal weakness and weakness.  Psychiatric/Behavioral: Negative.     I have reviewed and (if needed) personally updated the patient's problem list, medications, allergies, past medical and surgical history, social and family history.   PAST MEDICAL HISTORY   Past Medical History:  Diagnosis Date   Allergy    Depression    mild   Diabetes mellitus without complication ([place])    GERD (gastroesophageal reflux disease)    Heart murmur    Hyperlipidemia    Hypertension    Thyroid disease     PAST SURGICAL HISTORY   Past Surgical History:  Procedure Laterality Date   NO PAST SURGERIES      Immunization History  Administered Date(s) Administered   Influenza Whole 12/24/2007, 12/25/2008, 12/08/2009   Influenza,inj,Quad PF,6+ Mos 01/15/2015, 03/01/2016, 12/02/2016, 11/16/2018, 10/30/2020   Pneumococcal Conjugate-13 08/26/2016   Pneumococcal Polysaccharide-23 10/26/2007   Td 02/21/2001   Tdap  11/07/2013    MEDICATIONS/ALLERGIES   Current Meds  Medication Sig   amLODipine (NORVASC) 5 MG tablet Take 1 tablet (5 mg total) by mouth daily. para la presin arterial   Blood Glucose Monitoring Suppl (TRUE METRIX METER) w/Device KIT Use as directed   empagliflozin (JARDIANCE) 10 MG TABS tablet Take 1 tablet (10 mg total) by mouth daily before breakfast.   glipiZIDE (GLUCOTROL XL) 10 MG 24 hr tablet Take 2 tablets (20 mg total) by mouth daily with breakfast.   glucose blood test strip USE AS INSTRUCTED.   levothyroxine (SYNTHROID) 100 MCG tablet TAKE 1 TABLET (100 MCG TOTAL) BY MOUTH DAILY BEFORE BREAKFAST.   lisinopril (ZESTRIL) 20 MG tablet TAKE 1 TABLET (20 MG TOTAL) BY MOUTH DAILY.   loratadine (CLARITIN) 10 MG tablet Take 1 tablet (10 mg total) by mouth daily.   metFORMIN (GLUCOPHAGE) 1000 MG tablet TAKE 1 TABLET (1,000 MG TOTAL) BY MOUTH 2 (TWO) TIMES DAILY WITH A MEAL.   Multiple Vitamin (MULTIVITAMIN) tablet Take 1 tablet by mouth daily.   omega-3 acid ethyl esters (LOVAZA)  1 g capsule TAKE 2 CAPSULES (2 G TOTAL) BY MOUTH 2 (TWO) TIMES DAILY.   omeprazole (PRILOSEC) 20 MG capsule Take 1 capsule (20 mg total) by mouth daily as needed. PARA ACIDO   OVER THE COUNTER MEDICATION nutrilite carb blocker as needed- helps with constipation   rosuvastatin (CRESTOR) 5 MG tablet Take 1 tablet (5 mg total) by mouth daily.   SUMAtriptan (IMITREX) 50 MG tablet Take 1 tablet (50 mg total) by mouth every 2 (two) hours as needed for migraine. May repeat in 2 hours if headache persists or recurs.   TRUEplus Lancets 28G MISC Use as directed   Current Facility-Administered Medications for the 03/22/21 encounter (Office Visit) with Leonie Man, MD  Medication   0.9 %  sodium chloride infusion    Allergies  Allergen Reactions   Motrin [Ibuprofen] Rash   Penicillins Rash    SOCIAL HISTORY/FAMILY HISTORY   Reviewed in Epic:   Social History   Tobacco Use   Smoking status: Never    Smokeless tobacco: Never  Vaping Use   Vaping Use: Never used  Substance Use Topics   Alcohol use: No    Comment: rare    Drug use: No   Social History   Social History Narrative   Sometimes husband is aggressive but she talks back and is aggressive with him. Does not take it.       Native of [place], but has been living in [place] area for 18+ years.  Speaks Spanish only.      Usually active, enjoys doing Zumba exercise   Family History  Problem Relation Age of Onset   Hypertension Sister    Diabetes Brother    Breast cancer Maternal Aunt    Colon cancer Neg Hx    Colon polyps Neg Hx    Esophageal cancer Neg Hx    Rectal cancer Neg Hx    Stomach cancer Neg Hx     OBJCTIVE -PE, EKG, labs   Wt Readings from Last 3 Encounters:  03/22/21 173 lb (78.5 kg)  01/01/21 175 lb 8 oz (79.6 kg)  10/30/20 171 lb 6 oz (77.7 kg)    Physical Exam: BP (!) 144/76 (BP Location: Right Arm, Patient Position: Sitting, Cuff Size: Normal)    Pulse 92    Ht 5' (1.524 m)    Wt 173 lb (78.5 kg)    BMI 33.79 kg/m  Physical Exam Vitals reviewed.  Constitutional:      General: She is not in acute distress.    Appearance: Normal appearance. She is obese. She is not ill-appearing or toxic-appearing.     Comments: Well-nourished, well-groomed.  HENT:     Head: Normocephalic and atraumatic.  Neck:     Vascular: No carotid bruit or JVD.  Cardiovascular:     Rate and Rhythm: Normal rate and regular rhythm. No extrasystoles are present.    Chest Wall: PMI is not displaced.     Pulses: Normal pulses.     Heart sounds: S1 normal and S2 normal. Heart sounds are distant. Murmur heard.  Harsh crescendo-decrescendo early systolic murmur is present with a grade of 1/6 at the upper right sternal border radiating to the neck.    No gallop.  Pulmonary:     Effort: Pulmonary effort is normal. No respiratory distress.     Breath sounds: Normal breath sounds. No wheezing, rhonchi or rales.  Chest:      Chest wall: No tenderness.  Abdominal:  General: Abdomen is flat. Bowel sounds are normal. There is no distension.     Palpations: Abdomen is soft. There is no mass.     Tenderness: There is abdominal tenderness (Left lower quadrant tenderness). There is no guarding.     Comments: No HSM or bruit  Musculoskeletal:        General: No swelling. Normal range of motion.     Cervical back: Normal range of motion and neck supple.  Skin:    General: Skin is warm and dry.  Neurological:     General: No focal deficit present.     Mental Status: She is alert and oriented to person, place, and time.     Gait: Gait normal.  Psychiatric:        Mood and Affect: Mood normal.        Behavior: Behavior normal.        Thought Content: Thought content normal.        Judgment: Judgment normal.     Adult ECG Report  Rate: 90;  Rhythm: normal sinus rhythm and normal axis, intervals and durations. ;   Narrative Interpretation: Stable/normal  When compared to clinic EKG that shows sinus tachycardia had minimal lower BPM.  No major changes.  Recent Labs: Reviewed Lab Results  Component Value Date   CHOL 184 07/30/2020   HDL 57 07/30/2020   LDLCALC 87 07/30/2020   TRIG 239 (H) 07/30/2020   CHOLHDL 3.2 07/30/2020   Lab Results  Component Value Date   CREATININE 0.66 10/30/2020   BUN 11 10/30/2020   NA 134 10/30/2020   K 4.5 10/30/2020   CL 96 10/30/2020   CO2 22 10/30/2020   CBC Latest Ref Rng & Units 07/30/2020 04/17/2019 10/09/2018  WBC 3.4 - 10.8 x10E3/uL 8.7 8.2 8.2  Hemoglobin 11.1 - 15.9 g/dL 12.1 13.3 13.2  Hematocrit 34.0 - 46.6 % 37.1 38.5 41.2  Platelets 150 - 450 x10E3/uL 231 238 220    Lab Results  Component Value Date   HGBA1C 7.3 (A) 02/10/2021   Lab Results  Component Value Date   TSH 3.280 01/01/2021    ==================================================  COVID-19 Education: The signs and symptoms of COVID-19 were discussed with the patient and how to seek care  for testing (follow up with PCP or arrange E-visit).    I spent a total of 33 minutes with the patient spent in direct patient consultation.  Additional time spent with chart review  / charting (studies, outside notes, etc): 20 min Total Time: 53 min  Current medicines are reviewed at length with the patient today.  (+/- concerns) n/a  This visit occurred during the SARS-CoV-2 public health emergency.  Safety protocols were in place, including screening questions prior to the visit, additional usage of staff PPE, and extensive cleaning of exam room while observing appropriate contact time as indicated for disinfecting solutions.  Notice: This dictation was prepared with Dragon dictation along with smart phrase technology. Any transcriptional errors that result from this process are unintentional and may not be corrected upon review.   Studies Ordered:  Orders Placed This Encounter  Procedures   EKG 12-Lead   ECHOCARDIOGRAM COMPLETE    Patient Instructions / Medication Changes & Studies & Tests Ordered   Patient Instructions  Medication Instructions:  No  changes  *If you need a refill on your cardiac medications before your next appointment, please call your pharmacy*   Lab Work: Not needed   Testing/Procedures: Will be schedule at [address] [place] has requested that you have an echocardiogram. Echocardiography is a painless test that uses sound waves to create images of your heart. It provides your doctor with information about the size and shape of your heart and how well your hearts chambers and valves are working. This procedure takes approximately one hour. There are no restrictions for this procedure.    Follow-Up: At [facility], you and your health needs are our priority.  As part of our continuing mission to provide you with exceptional heart care, we have created designated Provider Care Teams.  These Care Teams include your  primary Cardiologist (physician) and Advanced Practice Providers (APPs -  Physician Assistants and Nurse Practitioners) who all work together to provide you with the care you need, when you need it.  We recommend signing up for the patient portal called "MyChart".  Sign up information is provided on this After Visit Summary.  MyChart is used to connect with patients for Virtual Visits (Telemedicine).  Patients are able to view lab/test results, encounter notes, upcoming appointments, etc.  Non-urgent messages can be sent to your provider as well.   To learn more about what you can do with MyChart, go to [email or web address].    Your next appointment:   2 to 3 month(s)  The format for your next appointment:   In Person  Provider:   Glenetta Hew, MD         Glenetta Hew, M.D., M.S. Interventional Cardiologist   Pager # [phone] Phone # [phone] [address] [place], [place] [address]   Thank you for choosing Heartcare at [facility]!!

## 2021-03-22 NOTE — Patient Instructions (Addendum)
Medication Instructions:  No  changes  *If you need a refill on your cardiac medications before your next appointment, please call your pharmacy*   Lab Work: Not needed   Testing/Procedures: Will be schedule at [facility] [address] Your physician has requested that you have an echocardiogram. Echocardiography is a painless test that uses sound waves to create images of your heart. It provides your doctor with information about the size and shape of your heart and how well your hearts chambers and valves are working. This procedure takes approximately one hour. There are no restrictions for this procedure.    Follow-Up: At [facility], you and your health needs are our priority.  As part of our continuing mission to provide you with exceptional heart care, we have created designated Provider Care Teams.  These Care Teams include your primary Cardiologist (physician) and Advanced Practice Providers (APPs -  Physician Assistants and Nurse Practitioners) who all work together to provide you with the care you need, when you need it.  We recommend signing up for the patient portal called "MyChart".  Sign up information is provided on this After Visit Summary.  MyChart is used to connect with patients for Virtual Visits (Telemedicine).  Patients are able to view lab/test results, encounter notes, upcoming appointments, etc.  Non-urgent messages can be sent to your provider as well.   To learn more about what you can do with MyChart, go to [email or web address].    Your next appointment:   2 to 3 month(s)  The format for your next appointment:   In Person  Provider:   Bryan Lemma, MD

## 2021-03-24 ENCOUNTER — Encounter: Payer: Self-pay | Admitting: Cardiology

## 2021-03-24 DIAGNOSIS — R9431 Abnormal electrocardiogram [ECG] [EKG]: Secondary | ICD-10-CM | POA: Insufficient documentation

## 2021-03-24 NOTE — Assessment & Plan Note (Signed)
Soft murmur heard on exam.  She said that this is occurred in the past, but not mentioned in some time.  Murmur sounds like it is aortic in nature, however could potentially be mitral.  There is also concern about potential structural abnormalities.  Plan: Check 2D echocardiogram

## 2021-03-24 NOTE — Assessment & Plan Note (Signed)
EKG reviewed from referring provider is relatively poor copy.  There is sinus tachycardia--101 bpm.  Nondiagnostic/nonpathologic Q waves in lead III with some nonspecific ST-T wave changes.  Relatively benign  EKG here is essentially normal.

## 2021-03-24 NOTE — Assessment & Plan Note (Signed)
Blood pressure is little high today, but she does note that she is a bit stressed. Can reassess in follow-up.  She is on amlodipine and lisinopril. Not on diuretic which would be an appropriate addition to lisinopril if additional blood pressure control required.Marland Kitchen

## 2021-03-24 NOTE — Assessment & Plan Note (Addendum)
Last A1c was 7.3.  Lipid panel not all that bad just elevated triglycerides-likely related to hyperglycemia.  LDL is 87.  Managed by PCP.  On low-dose rosuvastatin along with Lovaza for lipids.  On metformin, Jardiance, and glipizide for diabetes..  We did not talk about cardiovascular risk assessment at this time, however can discuss in follow-up.  May consider Coronary Calcium Score in.

## 2021-04-05 ENCOUNTER — Other Ambulatory Visit: Payer: Self-pay

## 2021-04-05 ENCOUNTER — Ambulatory Visit (HOSPITAL_COMMUNITY): Payer: No Typology Code available for payment source | Attending: Cardiology

## 2021-04-05 DIAGNOSIS — E1169 Type 2 diabetes mellitus with other specified complication: Secondary | ICD-10-CM | POA: Insufficient documentation

## 2021-04-05 DIAGNOSIS — I10 Essential (primary) hypertension: Secondary | ICD-10-CM | POA: Insufficient documentation

## 2021-04-05 DIAGNOSIS — E785 Hyperlipidemia, unspecified: Secondary | ICD-10-CM | POA: Insufficient documentation

## 2021-04-05 DIAGNOSIS — R011 Cardiac murmur, unspecified: Secondary | ICD-10-CM | POA: Insufficient documentation

## 2021-04-05 HISTORY — PX: TRANSTHORACIC ECHOCARDIOGRAM: SHX275

## 2021-04-05 LAB — ECHOCARDIOGRAM COMPLETE
AR max vel: 1.05 cm2
AV Area VTI: 1.06 cm2
AV Area mean vel: 1.04 cm2
AV Mean grad: 27 mmHg
AV Peak grad: 46.9 mmHg
Ao pk vel: 3.42 m/s
Area-P 1/2: 3.65 cm2
S' Lateral: 2.9 cm

## 2021-04-06 ENCOUNTER — Encounter: Payer: Self-pay | Admitting: Cardiology

## 2021-04-06 DIAGNOSIS — I77810 Thoracic aortic ectasia: Secondary | ICD-10-CM | POA: Insufficient documentation

## 2021-04-06 DIAGNOSIS — I712 Thoracic aortic aneurysm, without rupture, unspecified: Secondary | ICD-10-CM | POA: Insufficient documentation

## 2021-04-06 DIAGNOSIS — I350 Nonrheumatic aortic (valve) stenosis: Secondary | ICD-10-CM | POA: Insufficient documentation

## 2021-04-06 HISTORY — DX: Thoracic aortic aneurysm, without rupture, unspecified: I71.20

## 2021-04-09 ENCOUNTER — Telehealth: Payer: Self-pay | Admitting: *Deleted

## 2021-04-09 NOTE — Telephone Encounter (Signed)
-----   Message from Marykay Lex, MD sent at 04/06/2021 11:14 PM EST ----- Echocardiogram results:  Normal pump function with an ejection fraction of 60 to 65% (normal range is 50 to 70%).  Grade 1 diastolic dysfunction is normal for age.  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 ->  This is the cause of the murmur. ->  We will need to follow this closely.  There is also suggestion of dilation of the aorta as it comes off the heart.  The measured number seems to be moderately elevated.  Would like to evaluate with a CT scan of the aorta.   (Pls order CTA Chest Aorta to assess thoracic Aortic aneurysm).  Bryan Lemma, MD

## 2021-04-09 NOTE — Telephone Encounter (Signed)
Called  using an language interpreter ( spanish) [phone] Erin Huff  Patient did not answer left a voice mail to call office for results

## 2021-04-13 ENCOUNTER — Other Ambulatory Visit: Payer: Self-pay

## 2021-04-14 ENCOUNTER — Ambulatory Visit: Payer: Self-pay | Admitting: Nurse Practitioner

## 2021-04-19 ENCOUNTER — Other Ambulatory Visit: Payer: Self-pay | Admitting: Nurse Practitioner

## 2021-04-19 ENCOUNTER — Other Ambulatory Visit: Payer: Self-pay

## 2021-04-19 DIAGNOSIS — E039 Hypothyroidism, unspecified: Secondary | ICD-10-CM

## 2021-04-19 MED ORDER — LEVOTHYROXINE SODIUM 100 MCG PO TABS
ORAL_TABLET | ORAL | 0 refills | Status: DC
  Filled 2021-04-19: qty 30, 30d supply, fill #0

## 2021-04-21 ENCOUNTER — Ambulatory Visit: Payer: Self-pay | Admitting: Nurse Practitioner

## 2021-04-30 ENCOUNTER — Ambulatory Visit (HOSPITAL_COMMUNITY)
Admission: EM | Admit: 2021-04-30 | Discharge: 2021-04-30 | Disposition: A | Discharge status: Home / Self Care | Payer: No Typology Code available for payment source | Attending: Urgent Care | Admitting: Urgent Care

## 2021-04-30 ENCOUNTER — Encounter (HOSPITAL_COMMUNITY): Payer: Self-pay | Admitting: Emergency Medicine

## 2021-04-30 ENCOUNTER — Other Ambulatory Visit: Payer: Self-pay

## 2021-04-30 DIAGNOSIS — I350 Nonrheumatic aortic (valve) stenosis: Secondary | ICD-10-CM

## 2021-04-30 DIAGNOSIS — K047 Periapical abscess without sinus: Secondary | ICD-10-CM

## 2021-04-30 DIAGNOSIS — I10 Essential (primary) hypertension: Secondary | ICD-10-CM

## 2021-04-30 DIAGNOSIS — M791 Myalgia, unspecified site: Secondary | ICD-10-CM

## 2021-04-30 LAB — POC INFLUENZA A AND B ANTIGEN (URGENT CARE ONLY)
INFLUENZA A ANTIGEN, POC: NEGATIVE
INFLUENZA B ANTIGEN, POC: NEGATIVE

## 2021-04-30 MED ORDER — CLINDAMYCIN HCL 300 MG PO CAPS
300.0000 mg | ORAL_CAPSULE | Freq: Four times a day (QID) | ORAL | 0 refills | Status: AC
  Filled 2021-04-30: qty 28, 7d supply, fill #0

## 2021-04-30 MED ORDER — CLINDAMYCIN HCL 300 MG PO CAPS
300.0000 mg | ORAL_CAPSULE | Freq: Four times a day (QID) | ORAL | 0 refills | Status: DC
  Filled 2021-04-30: qty 28, 7d supply, fill #0

## 2021-04-30 NOTE — ED Provider Notes (Signed)
[name]    CSN: [id number] Arrival date & time: 04/30/21  1549      History   Chief Complaint Chief Complaint  Patient presents with   Hypertension    HPI Erin Huff is a 66 y.o. female.   Pleasant 66 year old female presents today with concerns of body aches, chills, and hypertension.  She states over the past 2 days she has felt extremely achy in her muscles.  She also reports pain in her shoulders and elbows.  She just had dental work performed 2 days ago in which a crown was placed and infection was removed from her teeth.  She was started on azithromycin, and is taken two tablets, 566m each.  She reports the myalgias started the day she started the azithromycin.  She is uncertain if she has ever taken this medication before.  She feels that her blood pressure is elevated secondary to her infection.  She states "when I get sick, my blood pressure elevates".  She denies a known fever.  She feels chilled.  She denies cough, sore throat, ear pain.  She was recently seen by a cardiologist the end of January in which her blood pressure at that time was 144/76.  Apart from her recent dental work and new azithromycin, she denies any change to her overall health.  She denies any shortness of breath, dyspnea, cough, palpitations, chest pain.  She denies headache currently, but states she had a headache and was dizzy this morning.  At that time she took her blood pressure and was 170/80, which is abnormally high for her.  She does have a newly diagnosed severe aortic stenosis which is being evaluated by cardiology.    Hypertension   Past Medical History:  Diagnosis Date   Allergy    Depression    mild   Diabetes mellitus without complication (HCC)    GERD (gastroesophageal reflux disease)    Heart murmur    Hyperlipidemia    Hypertension    Thyroid disease     Patient Active Problem List   Diagnosis Date Noted   Moderate to severe aortic stenosis  04/06/2021   Thoracic aortic aneurysm 04/06/2021   Nonspecific abnormal electrocardiogram (ECG) (EKG) 03/24/2021   LGSIL on Pap smear of cervix 07/06/2020   Uncontrolled type 2 diabetes mellitus with hyperglycemia, without long-term current use of insulin ([facility] 05/23/2017   Hyperlipidemia associated with type 2 diabetes mellitus ([facility] 12/20/2016   Thrombocytopenia ([facility] 01/14/2015   Allergic rhinitis 05/29/2009   OBESITY 12/25/2008   SKIN RASH 11/21/2007   Hypothyroidism 10/26/2007   CARDIAC MURMUR 10/26/2007   Diabetes mellitus ([facility] 04/20/2006   Essential hypertension 04/20/2006    Past Surgical History:  Procedure Laterality Date   NO PAST SURGERIES      OB History     Gravida  5   Para      Term      Preterm      AB      Living  5      SAB      IAB      Ectopic      Multiple      Live Births  5            Home Medications    Prior to Admission medications   Medication Sig Start Date End Date Taking? Authorizing Provider  amLODipine (NORVASC) 5 MG tablet Take 1 tablet (5 mg total) by mouth daily. para la presin arterial 01/01/21 04/30/21  Yes Gildardo Pounds, NP  glipiZIDE (GLUCOTROL XL) 10 MG 24 hr tablet Take 2 tablets (20 mg total) by mouth daily with breakfast. 02/10/21 05/11/21 Yes Newlin, Charlane Ferretti, MD  levothyroxine (SYNTHROID) 100 MCG tablet TAKE 1 TABLET (100 MCG TOTAL) BY MOUTH DAILY BEFORE BREAKFAST. 04/19/21 04/19/22 Yes Newlin, Enobong, MD  lisinopril (ZESTRIL) 20 MG tablet TAKE 1 TABLET (20 MG TOTAL) BY MOUTH DAILY. 10/30/20 05/19/21 Yes Gildardo Pounds, NP  metFORMIN (GLUCOPHAGE) 1000 MG tablet TAKE 1 TABLET (1,000 MG TOTAL) BY MOUTH 2 (TWO) TIMES DAILY WITH A MEAL. 10/30/20 05/19/21 Yes Gildardo Pounds, NP  rosuvastatin (CRESTOR) 5 MG tablet Take 1 tablet (5 mg total) by mouth daily. 02/10/21  Yes Charlott Rakes, MD  Blood Glucose Monitoring Suppl (TRUE METRIX METER) w/Device KIT Use as directed 06/14/16   Tresa Garter, MD  clindamycin  (CLEOCIN) 300 MG capsule Take 1 capsule (300 mg total) by mouth 4 (four) times daily for 7 days. 04/30/21 05/07/21  Damarkus Balis, Loree Fee L, PA  empagliflozin (JARDIANCE) 10 MG TABS tablet Take 1 tablet (10 mg total) by mouth daily before breakfast. 02/10/21   Charlott Rakes, MD  glucose blood test strip USE AS INSTRUCTED. 07/29/20 07/29/21  Gildardo Pounds, NP  loratadine (CLARITIN) 10 MG tablet Take 1 tablet (10 mg total) by mouth daily. 07/29/20   Gildardo Pounds, NP  Multiple Vitamin (MULTIVITAMIN) tablet Take 1 tablet by mouth daily.    [provider]  omega-3 acid ethyl esters (LOVAZA) 1 g capsule TAKE 2 CAPSULES (2 G TOTAL) BY MOUTH 2 (TWO) TIMES DAILY. 07/29/20 07/29/21  Gildardo Pounds, NP  omeprazole (PRILOSEC) 20 MG capsule Take 1 capsule (20 mg total) by mouth daily as needed. PARA ACIDO 01/01/21 04/01/21  Gildardo Pounds, NP  OVER THE COUNTER MEDICATION nutrilite carb blocker as needed- helps with constipation    [provider]  SUMAtriptan (IMITREX) 50 MG tablet Take 1 tablet (50 mg total) by mouth every 2 (two) hours as needed for migraine. May repeat in 2 hours if headache persists or recurs. 08/25/20   Mayers, Loraine Grip, PA-C  TRUEplus Lancets 28G MISC Use as directed 07/29/20   Gildardo Pounds, NP    Family History Family History  Problem Relation Age of Onset   Hypertension Sister    Diabetes Brother    Breast cancer Maternal Aunt    Colon cancer Neg Hx    Colon polyps Neg Hx    Esophageal cancer Neg Hx    Rectal cancer Neg Hx    Stomach cancer Neg Hx     Social History Social History   Tobacco Use   Smoking status: Never   Smokeless tobacco: Never  Vaping Use   Vaping Use: Never used  Substance Use Topics   Alcohol use: No    Comment: rare    Drug use: No     Allergies   Motrin [ibuprofen] and Penicillins   Review of Systems Review of Systems  Constitutional:  Positive for chills and fatigue.  HENT:  Positive for dental problem.   Musculoskeletal:   Positive for myalgias.  All other systems reviewed and are negative.   Physical Exam Triage Vital Signs ED Triage Vitals  Enc Vitals Group     BP 04/30/21 1659 (!) 162/82     Pulse Rate 04/30/21 1659 91     Resp 04/30/21 1659 16     Temp 04/30/21 1659 97.8 F (36.6 C)     Temp  Source 04/30/21 1659 Oral     SpO2 04/30/21 1659 98 %     Weight --      Height --      Head Circumference --      Peak Flow --      Pain Score 04/30/21 1831 0     Pain Loc --      Pain Edu? --      Excl. in [place]? --    No data found.  Updated Vital Signs BP (!) 162/82    Pulse 91    Temp 97.8 F (36.6 C) (Oral)    Resp 16    SpO2 98%   Visual Acuity Right Eye Distance:   Left Eye Distance:   Bilateral Distance:    Right Eye Near:   Left Eye Near:    Bilateral Near:     Physical Exam Vitals and nursing note reviewed. Exam conducted with a chaperone present.  Constitutional:      General: She is not in acute distress.    Appearance: Normal appearance. She is obese. She is not ill-appearing, toxic-appearing or diaphoretic.     Comments: Pt wrapped in heavy jacket, no visible rigors  HENT:     Head: Normocephalic and atraumatic.     Right Ear: Tympanic membrane, ear canal and external ear normal. There is no impacted cerumen.     Left Ear: Tympanic membrane, ear canal and external ear normal. There is no impacted cerumen.     Nose: Nose normal. No congestion or rhinorrhea.     Mouth/Throat:     Mouth: Mucous membranes are moist.     Pharynx: Oropharynx is clear. No oropharyngeal exudate or posterior oropharyngeal erythema.     Comments: Trismus on L; poor dentition to bottom R jaw, mild swelling to buccal mucosa Eyes:     General: No scleral icterus.       Right eye: No discharge.        Left eye: No discharge.     Extraocular Movements: Extraocular movements intact.     Conjunctiva/sclera: Conjunctivae normal.     Pupils: Pupils are equal, round, and reactive to light.  Cardiovascular:      Rate and Rhythm: Normal rate.     Heart sounds: Murmur (2/6 holosystolic murmur on R #ICS) heard.  Pulmonary:     Effort: Pulmonary effort is normal. No respiratory distress.     Breath sounds: Normal breath sounds. No stridor. No wheezing or rhonchi.  Chest:     Chest wall: No tenderness.  Abdominal:     General: Abdomen is flat.  Musculoskeletal:        General: Tenderness (reproducible tenderness to musculature, generalized) present. No swelling. Normal range of motion.     Cervical back: Normal range of motion and neck supple. No rigidity or tenderness.     Right lower leg: No edema.     Left lower leg: No edema.  Lymphadenopathy:     Cervical: No cervical adenopathy.  Skin:    General: Skin is warm.     Capillary Refill: Capillary refill takes less than 2 seconds.     Coloration: Skin is not jaundiced.     Findings: No erythema or rash.  Neurological:     General: No focal deficit present.     Mental Status: She is alert and oriented to person, place, and time.  Psychiatric:        Mood and Affect: Mood normal.     UC  Treatments / Results  Labs (all labs ordered are listed, but only abnormal results are displayed) Labs Reviewed  POC INFLUENZA A AND B ANTIGEN (URGENT CARE ONLY)    EKG   Radiology No results found.  Procedures Procedures (including critical care time)  Medications Ordered in UC Medications - No data to display  Initial Impression / Assessment and Plan / UC Course  I have reviewed the triage vital signs and the nursing notes.  Pertinent labs & imaging results that were available during my care of the patient were reviewed by me and considered in my medical decision making (see chart for details).  Clinical Course as of 04/30/21 2037  Fri Apr 30, 2021  1727 172/81 [WC]    Clinical Course User Index [WC] Chaney Malling, [place]    Primary HTN - now being managed by cardiology. Pt with severe AS which limits medication options. Will defer  additional tx to cardiology, although he stated previously diuretics would be next best option. Pt believes her elevated BP is secondary to her not feeling well today. Call PCP and cardiology Monday to schedule follow up evaluation. AS - severe. BB and vasodilators not appropriate. Will defer additional workup and management to cardiology. Dental infection - pts sx started the day of being placed on azithromycin. She is concerned this is an adverse medication reaction. Will stop the zpack, particularly in light of recent cardiac issues, and start clindamycin. Pt understands major side effect can include C. Diff and sx to watch out for. Myalgias - question medication side effect vs viral illness. Flu test in office negative.   Final Clinical Impressions(s) / UC Diagnoses   Final diagnoses:  Primary hypertension  Aortic stenosis, severe  Dental infection  Myalgia     Discharge Instructions      Please stop the azithromycin. It appears the antibiotic may not be helping with the dental infection. Please start taking clindamycin four times daily with yogurt. This medication can cause diarrhea, so please monitor for this. If severe diarrhea occurs, stop the medication and follow up with PCP. Your flu test was negative.  Your BP is elevated. Please call your cardiologist on Monday to discuss possible additional medications. Head to the ER if any new or worsening symptoms develop.    ED Prescriptions     Medication Sig Dispense Auth. Provider   clindamycin (CLEOCIN) 300 MG capsule Take 1 capsule (300 mg total) by mouth 4 (four) times daily for 7 days. 28 capsule Marcine Gadway L, PA      PDMP not reviewed this encounter.   Chaney Malling, [place] 04/30/21 2040

## 2021-04-30 NOTE — Discharge Instructions (Addendum)
Please stop the azithromycin. It appears the antibiotic may not be helping with the dental infection. ?Please start taking clindamycin four times daily with yogurt. This medication can cause diarrhea, so please monitor for this. If severe diarrhea occurs, stop the medication and follow up with PCP. ?Your flu test was negative.  ?Your BP is elevated. Please call your cardiologist on Monday to discuss possible additional medications. ?Head to the ER if any new or worsening symptoms develop. ?

## 2021-04-30 NOTE — ED Triage Notes (Signed)
PT here for HTN. ? ?At home she has gotten: 170/80 ? ?Denies chest pain, SHOB ? ?Endorses dizziness and headache this AM ?No headache at this time.  ?

## 2021-04-30 NOTE — ED Triage Notes (Signed)
PT is taking azithromycin post dental procedures, she started this 2 days ago.  ?

## 2021-05-03 ENCOUNTER — Other Ambulatory Visit: Payer: Self-pay

## 2021-05-04 ENCOUNTER — Other Ambulatory Visit: Payer: Self-pay

## 2021-05-11 ENCOUNTER — Other Ambulatory Visit: Payer: Self-pay | Admitting: Nurse Practitioner

## 2021-05-11 ENCOUNTER — Other Ambulatory Visit: Payer: Self-pay

## 2021-05-11 DIAGNOSIS — E1165 Type 2 diabetes mellitus with hyperglycemia: Secondary | ICD-10-CM

## 2021-05-11 DIAGNOSIS — I10 Essential (primary) hypertension: Secondary | ICD-10-CM

## 2021-05-12 ENCOUNTER — Ambulatory Visit: Payer: No Typology Code available for payment source | Attending: Nurse Practitioner | Admitting: Nurse Practitioner

## 2021-05-12 ENCOUNTER — Other Ambulatory Visit: Payer: Self-pay

## 2021-05-12 ENCOUNTER — Encounter: Payer: Self-pay | Admitting: Nurse Practitioner

## 2021-05-12 VITALS — BP 159/85 | HR 89 | Resp 18 | Ht 60.0 in | Wt 173.5 lb

## 2021-05-12 DIAGNOSIS — E785 Hyperlipidemia, unspecified: Secondary | ICD-10-CM

## 2021-05-12 DIAGNOSIS — E1165 Type 2 diabetes mellitus with hyperglycemia: Secondary | ICD-10-CM

## 2021-05-12 DIAGNOSIS — I10 Essential (primary) hypertension: Secondary | ICD-10-CM

## 2021-05-12 DIAGNOSIS — E559 Vitamin D deficiency, unspecified: Secondary | ICD-10-CM

## 2021-05-12 LAB — POCT GLYCOSYLATED HEMOGLOBIN (HGB A1C): Hemoglobin A1C: 8.1 % — AB (ref 4.0–5.6)

## 2021-05-12 LAB — GLUCOSE, POCT (MANUAL RESULT ENTRY): POC Glucose: 260 mg/dl — AB (ref 70–99)

## 2021-05-12 MED ORDER — LISINOPRIL 20 MG PO TABS
ORAL_TABLET | Freq: Every day | ORAL | 1 refills | Status: DC
  Filled 2021-05-12: qty 30, 30d supply, fill #0
  Filled 2021-06-04: qty 30, 30d supply, fill #1

## 2021-05-12 MED ORDER — METFORMIN HCL 1000 MG PO TABS
ORAL_TABLET | Freq: Two times a day (BID) | ORAL | 1 refills | Status: DC
  Filled 2021-05-12: qty 180, 90d supply, fill #0
  Filled 2021-08-20: qty 180, 90d supply, fill #1

## 2021-05-12 MED ORDER — AMLODIPINE BESYLATE 10 MG PO TABS
10.0000 mg | ORAL_TABLET | Freq: Every day | ORAL | 1 refills | Status: DC
  Filled 2021-05-12: qty 90, 90d supply, fill #0

## 2021-05-12 NOTE — Progress Notes (Signed)
? ?Assessment & Plan:  ?[name] was seen today for diabetes and hypertension. ? ?Diagnoses and all orders for this visit: ? ?Primary hypertension ?-     amLODipine (NORVASC) 10 MG tablet; Take 1 tablet (10 mg total) by mouth daily. para la presi?n arterial ?-     CMP14+EGFR ? ?Uncontrolled type 2 diabetes mellitus with hyperglycemia, without long-term current use of insulin (HCC) ?-     POCT glycosylated hemoglobin (Hb A1C) ?-     POCT glucose (manual entry) ?-     CMP14+EGFR ? ?Dyslipidemia, goal LDL below 70 ?Not at [facility]. May need to increase crestor to 10 mg ?-     Lipid panel ? ?Vitamin D deficiency disease ?-     VITAMIN D 25 Hydroxy (Vit-D Deficiency, Fractures) ? ? ? ?Patient has been counseled on age-appropriate routine health concerns for screening and prevention. These are reviewed and up-to-date. Referrals have been placed accordingly. Immunizations are up-to-date or declined.    ?Subjective:  ? ?Chief Complaint  ?Patient presents with  ? Diabetes  ? Hypertension  ? ?HPI ?Erin Huff 66 y.o. female presents to office today for BP check and A1c ? ?VRI was used to communicate directly with patient for the entire encounter including providing detailed patient instructions.   ? ?DM 2 ?She has not been taking 2 tablets of glipizide as prescribed. Only 1 tablet daily. She never started [place]. I have instructed her to take 2 tablets of glipizide daily and continue metformin 1000 mg BID.  ?Lab Results  ?Component Value Date  ? HGBA1C 8.1 (A) 05/12/2021  ?  ?Lab Results  ?Component Value Date  ? HGBA1C 7.3 (A) 02/10/2021  ?  ?Lab Results  ?Component Value Date  ? [place] 87 07/30/2020  ?  ? ?HTN ?Currently taking amlodipine 5 mg daily and lisinopril 20 mg daily. Blood pressure is elevated today. Last home reading was 140/80.Will increase amlodipine to  10 mg daily.  ?BP Readings from Last 3 Encounters:  ?05/12/21 (!) 159/85  ?04/30/21 (!) 162/82  ?03/22/21 (!) 144/76  ?  ? ?She is still taking  antibiotics for a dental infection.  ? ?Review of Systems  ?Constitutional:  Negative for fever, malaise/fatigue and weight loss.  ?HENT: Negative.  Negative for nosebleeds.   ?Eyes: Negative.  Negative for blurred vision, double vision and photophobia.  ?Respiratory: Negative.  Negative for cough and shortness of breath.   ?Cardiovascular: Negative.  Negative for chest pain, palpitations and leg swelling.  ?Gastrointestinal: Negative.  Negative for heartburn, nausea and vomiting.  ?Musculoskeletal: Negative.  Negative for myalgias.  ?Neurological: Negative.  Negative for dizziness, focal weakness, seizures and headaches.  ?Psychiatric/Behavioral: Negative.  Negative for suicidal ideas.   ? ?Past Medical History:  ?Diagnosis Date  ? Allergy   ? Depression   ? mild  ? Diabetes mellitus without complication ([place])   ? GERD (gastroesophageal reflux disease)   ? Heart murmur   ? Hyperlipidemia   ? Hypertension   ? Thyroid disease   ? ? ?Past Surgical History:  ?Procedure Laterality Date  ? NO PAST SURGERIES    ? ? ?Family History  ?Problem Relation Age of Onset  ? Hypertension Sister   ? Diabetes Brother   ? Breast cancer Maternal Aunt   ? Colon cancer Neg Hx   ? Colon polyps Neg Hx   ? Esophageal cancer Neg Hx   ? Rectal cancer Neg Hx   ? Stomach cancer Neg Hx   ? ? ?Social  History Reviewed with no changes to be made today.  ? ?Outpatient Medications Prior to Visit  ?Medication Sig Dispense Refill  ? Blood Glucose Monitoring Suppl (TRUE METRIX METER) w/Device KIT Use as directed 1 kit 0  ? glucose blood test strip USE AS INSTRUCTED. 100 strip 12  ? levothyroxine (SYNTHROID) 100 MCG tablet TAKE 1 TABLET (100 MCG TOTAL) BY MOUTH DAILY BEFORE BREAKFAST. 30 tablet 0  ? loratadine (CLARITIN) 10 MG tablet Take 1 tablet (10 mg total) by mouth daily. 90 tablet 3  ? Multiple Vitamin (MULTIVITAMIN) tablet Take 1 tablet by mouth daily.    ? omega-3 acid ethyl esters (LOVAZA) 1 g capsule TAKE 2 CAPSULES (2 G TOTAL) BY MOUTH 2 (TWO)  TIMES DAILY. 360 capsule 1  ? OVER THE COUNTER MEDICATION nutrilite carb blocker as needed- helps with constipation    ? rosuvastatin (CRESTOR) 5 MG tablet Take 1 tablet (5 mg total) by mouth daily. 30 tablet 2  ? SUMAtriptan (IMITREX) 50 MG tablet Take 1 tablet (50 mg total) by mouth every 2 (two) hours as needed for migraine. May repeat in 2 hours if headache persists or recurs. 10 tablet 0  ? TRUEplus Lancets 28G MISC Use as directed 100 each 5  ? amLODipine (NORVASC) 5 MG tablet Take 1 tablet (5 mg total) by mouth daily. para la presi?n arterial 90 tablet 1  ? empagliflozin (JARDIANCE) 10 MG TABS tablet Take 1 tablet (10 mg total) by mouth daily before breakfast. 30 tablet 2  ? lisinopril (ZESTRIL) 20 MG tablet TAKE 1 TABLET (20 MG TOTAL) BY MOUTH DAILY. 90 tablet 1  ? metFORMIN (GLUCOPHAGE) 1000 MG tablet TAKE 1 TABLET (1,000 MG TOTAL) BY MOUTH 2 (TWO) TIMES DAILY WITH A MEAL. 180 tablet 1  ? glipiZIDE (GLUCOTROL XL) 10 MG 24 hr tablet Take 2 tablets (20 mg total) by mouth daily with breakfast. 180 tablet 1  ? omeprazole (PRILOSEC) 20 MG capsule Take 1 capsule (20 mg total) by mouth daily as needed. PARA ACIDO 30 capsule 1  ? ?Facility-Administered Medications Prior to Visit  ?Medication Dose Route Frequency Provider Last Rate Last Admin  ? 0.9 %  sodium chloride infusion  500 mL Intravenous Continuous Pyrtle, Carie Caddy, MD      ? ? ?Allergies  ?Allergen Reactions  ? Motrin [Ibuprofen] Rash  ? Penicillins Rash  ? ? ?   ?Objective:  ?  ?BP (!) 159/85   Pulse 89   Resp 18   Ht 5' (1.524 m)   Wt 173 lb 8 oz (78.7 kg)   SpO2 98%   BMI 33.88 kg/m?  ?Wt Readings from Last 3 Encounters:  ?05/12/21 173 lb 8 oz (78.7 kg)  ?03/22/21 173 lb (78.5 kg)  ?01/01/21 175 lb 8 oz (79.6 kg)  ? ? ?Physical Exam ?Vitals and nursing note reviewed.  ?Constitutional:   ?   Appearance: She is well-developed.  ?HENT:  ?   Head: Normocephalic and atraumatic.  ?Cardiovascular:  ?   Rate and Rhythm: Normal rate and regular rhythm.  ?    Heart sounds: Normal heart sounds. No murmur heard. ?  No friction rub. No gallop.  ?Pulmonary:  ?   Effort: Pulmonary effort is normal. No tachypnea or respiratory distress.  ?   Breath sounds: Normal breath sounds. No decreased breath sounds, wheezing, rhonchi or rales.  ?Chest:  ?   Chest wall: No tenderness.  ?Abdominal:  ?   General: Bowel sounds are normal.  ?  Palpations: Abdomen is soft.  ?Musculoskeletal:     ?   General: Normal range of motion.  ?   Cervical back: Normal range of motion.  ?Skin: ?   General: Skin is warm and dry.  ?Neurological:  ?   Mental Status: She is alert and oriented to person, place, and time.  ?   Coordination: Coordination normal.  ?Psychiatric:     ?   Behavior: Behavior normal. Behavior is cooperative.     ?   Thought Content: Thought content normal.     ?   Judgment: Judgment normal.  ? ? ? ? ?   ?Patient has been counseled extensively about nutrition and exercise as well as the importance of adherence with medications and regular follow-up. The patient was given clear instructions to go to ER or return to medical center if symptoms don't improve, worsen or new problems develop. The patient verbalized understanding.  ? ?Follow-up: Return in about 4 weeks (around 06/09/2021) for BP CHECK WITH LUKE in 4 weeks. See me in 3 months.  ? ?Gildardo Pounds, FNP-BC ?[place] ?[place], [place] ?[phone]   ?05/12/2021, 8:47 PM ?

## 2021-05-13 ENCOUNTER — Other Ambulatory Visit: Payer: Self-pay

## 2021-05-13 ENCOUNTER — Other Ambulatory Visit: Payer: Self-pay | Admitting: Nurse Practitioner

## 2021-05-13 LAB — CMP14+EGFR
ALT: 20 IU/L (ref 0–32)
AST: 20 IU/L (ref 0–40)
Albumin/Globulin Ratio: 1.5 (ref 1.2–2.2)
Albumin: 4.7 g/dL (ref 3.8–4.8)
Alkaline Phosphatase: 125 IU/L — ABNORMAL HIGH (ref 44–121)
BUN/Creatinine Ratio: 19 (ref 12–28)
BUN: 14 mg/dL (ref 8–27)
Bilirubin Total: 0.2 mg/dL (ref 0.0–1.2)
CO2: 17 mmol/L — ABNORMAL LOW (ref 20–29)
Calcium: 10 mg/dL (ref 8.7–10.3)
Chloride: 101 mmol/L (ref 96–106)
Creatinine, Ser: 0.73 mg/dL (ref 0.57–1.00)
Globulin, Total: 3.2 g/dL (ref 1.5–4.5)
Glucose: 233 mg/dL — ABNORMAL HIGH (ref 70–99)
Potassium: 4.5 mmol/L (ref 3.5–5.2)
Sodium: 135 mmol/L (ref 134–144)
Total Protein: 7.9 g/dL (ref 6.0–8.5)
eGFR: 92 mL/min/{1.73_m2} (ref >59)

## 2021-05-13 LAB — LIPID PANEL
Chol/HDL Ratio: 2.2 ratio (ref 0.0–4.4)
Cholesterol, Total: 146 mg/dL (ref 100–199)
HDL: 67 mg/dL (ref >39)
LDL Chol Calc (NIH): 57 mg/dL (ref 0–99)
Triglycerides: 128 mg/dL (ref 0–149)
VLDL Cholesterol Cal: 22 mg/dL (ref 5–40)

## 2021-05-13 LAB — VITAMIN D 25 HYDROXY (VIT D DEFICIENCY, FRACTURES): Vit D, 25-Hydroxy: 28.7 ng/mL — ABNORMAL LOW (ref 30.0–100.0)

## 2021-05-13 MED ORDER — VITAMIN D (ERGOCALCIFEROL) 1.25 MG (50000 UNIT) PO CAPS
50000.0000 [IU] | ORAL_CAPSULE | ORAL | 1 refills | Status: DC
  Filled 2021-05-13: qty 12, 84d supply, fill #0
  Filled 2021-09-17: qty 12, 84d supply, fill #1

## 2021-06-04 ENCOUNTER — Other Ambulatory Visit: Payer: Self-pay

## 2021-06-04 ENCOUNTER — Other Ambulatory Visit: Payer: Self-pay | Admitting: Family Medicine

## 2021-06-04 DIAGNOSIS — E039 Hypothyroidism, unspecified: Secondary | ICD-10-CM

## 2021-06-04 MED ORDER — LEVOTHYROXINE SODIUM 100 MCG PO TABS
ORAL_TABLET | ORAL | 2 refills | Status: DC
  Filled 2021-06-04: qty 30, 30d supply, fill #0
  Filled 2021-07-06: qty 30, 30d supply, fill #1
  Filled 2021-07-21: qty 30, 30d supply, fill #2

## 2021-06-07 ENCOUNTER — Other Ambulatory Visit: Payer: Self-pay

## 2021-06-10 ENCOUNTER — Other Ambulatory Visit: Payer: Self-pay

## 2021-06-10 ENCOUNTER — Ambulatory Visit: Payer: Self-pay | Attending: Nurse Practitioner | Admitting: Pharmacist

## 2021-06-10 VITALS — BP 134/80 | HR 84

## 2021-06-10 DIAGNOSIS — I10 Essential (primary) hypertension: Secondary | ICD-10-CM

## 2021-06-10 NOTE — Patient Instructions (Signed)

## 2021-06-10 NOTE — Progress Notes (Signed)
? ?  S:    ? ?No chief complaint on file. ? ? ?Erin Huff is a 66 y.o. female who presents for hypertension evaluation, education, and management. PMH is significant for T2DM, HTN, aortic stenosis, hypothyroidism, obesity. Patient was referred and last seen by Primary Care Provider, [name], on 05/12/2021. Amlodipine dose was increased at that appt.  ? ?Today, patient arrives in good spirits and presents without assistance. Denies dizziness, headache, blurred vision, swelling.  ? ?Patient reports hypertension is longstanding.  ? ?Family/Social history:  ?-Fhx: HTN, DM ?-Tobacco: never smoker  ?-Alcohol: denies use  ? ?Medication adherence reported. Patient has taken lisinopril today. She takes her amlodipine in the evening.  ? ?Current antihypertensives include: amlodipine 10 mg daily, lisinopril 20 mg daily ? ?Reported home BP readings: none ? ?Patient reported dietary habits:  ?-Compliant with sodium restriction.  ?-Denies drinking excessive caffeine.  ? ?Patient-reported exercise habits: none ? ?O:  ?Vitals:  ? 06/10/21 1531  ?BP: 134/80  ?Pulse: 84  ? ? ?Last 3 Office BP readings: ?BP Readings from Last 3 Encounters:  ?06/10/21 134/80  ?05/12/21 (!) 159/85  ?04/30/21 (!) 162/82  ? ? ?BMET ?   ?Component Value Date/Time  ? NA 135 05/12/2021 1150  ? K 4.5 05/12/2021 1150  ? CL 101 05/12/2021 1150  ? CO2 17 (L) 05/12/2021 1150  ? GLUCOSE 233 (H) 05/12/2021 1150  ? GLUCOSE 99 09/26/2016 1603  ? BUN 14 05/12/2021 1150  ? CREATININE 0.73 05/12/2021 1150  ? CREATININE 0.81 03/01/2016 0956  ? CALCIUM 10.0 05/12/2021 1150  ? GFRNONAA 95 01/21/2020 1455  ? GFRAA 109 01/21/2020 1455  ? ? ?Renal function: ?CrCl cannot be calculated (Patient's most recent lab result is older than the maximum 21 days allowed.). ? ?Clinical ASCVD: No  ?The 10-year ASCVD risk score (Arnett DK, et al., 2019) is: 10.6% ?  Values used to calculate the score: ?    Age: 78 years ?    Sex: Female ?    Is Non-Hispanic African  American: No ?    Diabetic: Yes ?    Tobacco smoker: No ?    Systolic Blood Pressure: [id number] mmHg ?    Is BP treated: Yes ?    HDL Cholesterol: 67 mg/dL ?    Total Cholesterol: 146 mg/dL ? ?A/P: ?Hypertension longstanding currently close to goal on current medications. BP goal < 130/80 mmHg. Medication adherence appears appropriate.  ?-Continued current regimen.   ?-Counseled on lifestyle modifications for blood pressure control including reduced dietary sodium, increased exercise, adequate sleep. ?-Encouraged patient to check BP at home and bring log of readings to next visit. Counseled on proper use of home BP cuff.  ? ?Results reviewed and written information provided. Patient verbalized understanding of treatment plan. Total time in face-to-face counseling 20 minutes.  ? ?F/u clinic visit in 1 month.  ? ?Benard Halsted, PharmD, BCACP, CPP ?Clinical Pharmacist ?[place] ?[phone] ? ? ?

## 2021-06-14 ENCOUNTER — Ambulatory Visit (INDEPENDENT_AMBULATORY_CARE_PROVIDER_SITE_OTHER): Payer: Self-pay | Admitting: Cardiology

## 2021-06-14 ENCOUNTER — Encounter: Payer: Self-pay | Admitting: Cardiology

## 2021-06-14 VITALS — BP 140/78 | HR 86 | Ht 60.0 in | Wt 172.0 lb

## 2021-06-14 DIAGNOSIS — I7121 Aneurysm of the ascending aorta, without rupture: Secondary | ICD-10-CM

## 2021-06-14 DIAGNOSIS — I350 Nonrheumatic aortic (valve) stenosis: Secondary | ICD-10-CM

## 2021-06-14 DIAGNOSIS — E1165 Type 2 diabetes mellitus with hyperglycemia: Secondary | ICD-10-CM

## 2021-06-14 DIAGNOSIS — I10 Essential (primary) hypertension: Secondary | ICD-10-CM

## 2021-06-14 MED ORDER — CARVEDILOL 6.25 MG PO TABS
6.2500 mg | ORAL_TABLET | Freq: Two times a day (BID) | ORAL | 3 refills | Status: DC
  Filled 2021-06-14: qty 180, 90d supply, fill #0
  Filled 2021-09-10: qty 180, 90d supply, fill #1
  Filled 2021-12-13: qty 180, 90d supply, fill #2

## 2021-06-14 MED ORDER — AMLODIPINE BESYLATE 10 MG PO TABS: 10.0000 mg | ORAL_TABLET | Freq: Every evening | ORAL | 1 refills | Status: DC

## 2021-06-14 MED ORDER — LISINOPRIL 20 MG PO TABS: 20.0000 mg | ORAL_TABLET | Freq: Every morning | ORAL | 1 refills | Status: DC

## 2021-06-14 NOTE — Progress Notes (Signed)
? ? ?Primary Care Provider: Claiborne Rigg, NP ?[facility] Cardiologist: Bryan Lemma, MD ?Electrophysiologist: None ? ?Clinic Note: ?No chief complaint on file. ? ?=================================== ? ?ASSESSMENT/PLAN  ? ?Problem List Items Addressed This Visit   ? ?  ? Cardiology Problems  ? Moderate to severe aortic stenosis (Chronic)  ?  Actually by exam, not unexpectedly moderate to severe stenosis.  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 ? ?Mean gradient is currently 27 mmHg on initial evaluation.  Would like to reassess echo in about 6 months to ensure that it is not increasing in size.  Concerning features also is that this is associated with a thoracic aortic aneurysm.  I explained to her that the decision about when and if to move forward with surgical repair with be either or related to the valve versus the aneurysm.  Went to refer to surgery will be based on the upcoming studies. ? ?We talked about the symptoms that are concerning for aortic stenosis including CHF and angina symptoms as well as syncope near syncope.  She has not had any of these.  I explained to her that this is very reassuring. ? ?Also explained that as part of the evaluation for this potential surgery, she would need ischemic evaluation, which I would be more inclined to perform using a Coronary CTA over cardiac catheterization. ? ?  ?  ? Relevant Medications  ? carvedilol (COREG) 6.25 MG tablet  ? amLODipine (NORVASC) 10 MG tablet  ? lisinopril (ZESTRIL) 20 MG tablet  ? Other Relevant Orders  ? CT ANGIO CHEST AORTA W/CM & OR WO/CM  ? Basic metabolic panel  ? Thoracic aortic aneurysm (HCC) (Chronic)  ?  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 ? ?Unfortunately this  would potentially make it very difficult to consider the possibility of TAVR over open Bentall procedure.  I spent quite a bit of time explaining the difference in this procedure I explained the pathophysiology. ? ?Plan: ?We will check Chest CTA-aorta (would prefer to go down to the abdomen pelvis.) ?Needs pre-CT labs checked. ?Better blood pressure control required. ->  She is already on amlodipine 10 mg daily along with lisinopril 20 mg daily. ?We will start carvedilol 6.25 mg twice daily ?Crestor was increased to 10 mg daily and will increase further to 20 mg. ?  ?  ? Relevant Medications  ? carvedilol (COREG) 6.25 MG tablet  ? amLODipine (NORVASC) 10 MG tablet  ? lisinopril (ZESTRIL) 20 MG tablet  ? Essential hypertension (Chronic)  ?  Blood pressure still up a little bit and she had some labile pressures. ?PCP recently increased amlodipine dose to 10 mg, but with aortic stenosis, would like to have a beta-blocker component. ? ?Plan: Continue current meds and add carvedilol 6.25 mg twice daily.  She will start off with taking 1/2 tablet twice daily for 10 days. ?-> She will adjust her dosing timing to take lisinopril in the morning and amlodipine in the evening with the planned carvedilol dose minimal. ? ?  ?  ? Relevant Medications  ? carvedilol (COREG) 6.25 MG tablet  ? amLODipine (NORVASC) 10 MG tablet  ? lisinopril (ZESTRIL) 20 MG tablet  ?  ? Other  ? Uncontrolled type 2 diabetes mellitus with hyperglycemia,  without long-term current use of insulin (HCC) - Primary (Chronic)  ?  Will defer to PCP.  Would like to see A1c down as low as possible. ?She is currently on glipizide and metformin.  Had been on Jardiance but is no longer present. ?In light of her cardiac disease, Jardiance or Marcelline Deist would unfavorable option. ? ?She is on rosuvastatin that was recently increased to 10 mg.  Need to see an improvement otherwise but increase to 20 mg. ?Continue Lovaza ? ?  ?  ? Relevant Medications  ? lisinopril  (ZESTRIL) 20 MG tablet  ? Other Relevant Orders  ? CT ANGIO CHEST AORTA W/CM & OR WO/CM  ? Basic metabolic panel  ? ?Other Visit Diagnoses   ? ? Primary hypertension      ? Relevant Medications  ? carvedilol (COREG) 6.25 MG tablet  ? amLODipine (NORVASC) 10 MG tablet  ? lisinopril (ZESTRIL) 20 MG tablet  ? ?  ?=================================== ? ?HPI:   ? ?Erin Huff is a 66 y.o. female with PMH notable for uncontrolled DM-2, HTN, and HLD who is being seen today for the evaluation of Abnormal EKG / Tachycardia & Reported H/o Murmur.  She was initially seen at the request of Claiborne Rigg, NP who noted a murmur on exam.. ? ?Erin Huff was seen in initial consultation on 03/12/2021: She presented with her daughter and Spanish interpreter.  The daughter actually helped with some of the, collisions.  She was not sure why she was referred indicating that she did not have any symptoms-having only on to the doctor's office because of having a cold.  She remembered something abnormal on her EKG and that she was told she had a murmur.  She could not recall any history of having a child of illness to suggest rheumatic fever, but does remember having a sore throat.  Very active at baseline does Zumba exercise classes and prior to her cold was not having any dyspnea or chest pain with rest or exertion.  No heart failure symptoms of PND orthopnea.  No syncope/near syncope. ?=> Pretty significant systolic ejection murmur heard concerning for aortic stenosis.  2D echo ordered. ? ?Recent Hospitalizations:  ?Urgent Care visit 04/30/2021 -> she presented with body aches chills and hypertension.  She had recently had a dental procedure done and was placed on antibiotics for infection.  Indicated that when she gets sick, her blood pressure increases. ?Seen in follow-up by Ms. Meredeth Ide, NP 05/12/2021 -> increased amlodipine to 10 mg daily along with lisinopril 20 mg daily. ?Crestor increased to 10 mg  daily. ?Noted that she had never started taking Jardiance.  Decided to hold off. ? ?Reviewed  CV studies:   ? ?The following studies were reviewed today: (if available, images/films reviewed: From Epic Chart or Care Everywhere) ?TTE 04/06/2011: EF 60 to 65%.  GR 1 DD.  Normal RV size and mildly elevated RVSP.  Normal RAP.  Moderate to Severe Aortic Stenosis.  Mean gradient 27 mmHg.  Moderate dilation of the ascending aorta measuring 46 mm.  Recommend CTA.: ? ? ?Interval History:  ? ?Erin Huff presents here today/interpreter to assist with dictation. ? ?CV Review of Symptoms (Summary) ?Cardiovascular ROS: no chest pain or dyspnea on exertion ?positive for - -occasional issues with fast heart rates when not feeling well. ?negative for - edema, orthopnea, palpitations, paroxysmal nocturnal dyspnea, shortness of breath, or lightheadedness, dizziness or wooziness, syncope/near syncope or TIA/amaurosis fugax, claudication ? ?REVIEWED OF SYSTEMS  ? ?Review of Systems  ?  Constitutional:  Negative for malaise/fatigue (Getting back to full energy level after recovering from cold) and weight loss.  ?     No longer having the aftereffects of her cold.  Probably recovering.  ?HENT:  Negative for congestion (Resolved).   ?Respiratory:    ?     Recovered from cold-no more cough or sneezing.  ?Cardiovascular:   ?     Negative per HPI  ?Gastrointestinal:  Positive for abdominal pain (Some left lower quadrant pain). Negative for blood in stool and melena.  ?Genitourinary:  Negative for hematuria.  ?Musculoskeletal: Negative.   ?Neurological:  Negative for dizziness, focal weakness and weakness.  ?Psychiatric/Behavioral: Negative.    ? ?I have reviewed and (if needed) personally updated the patient's problem list, medications, allergies, past medical and surgical history, social and family history.  ? ?PAST MEDICAL HISTORY  ? ?Past Medical History:  ?Diagnosis Date  ? Allergy   ? Depression   ? mild  ? Diabetes mellitus  without complication (HCC)   ? GERD (gastroesophageal reflux disease)   ? Hyperlipidemia   ? Hypertension   ? Moderate to severe aortic stenosis 03/2011  ? Moderate to severe AS noted on echo with mean gradient 27 mm

## 2021-06-14 NOTE — Patient Instructions (Signed)
Medication Instructions:  ? ? ?Start Carvedilol 6.25 mg  twice a day  ( morning and evening)follow instruction below: ? ?Lisinopril  20 mg  in the morning with dose carvedilol  6.25 mg  ? ?Amlodipine  10 mg  in the evening  with dose of carvedilol 6.25 mg ? ? ?*If you need a refill on your cardiac medications before your next appointment, please call your pharmacy* ? ? ?Lab Work: ?BMP--  please do lab work  once you receive appointment time for CT of chest angiogram of Aorta  ? ?If you have labs (blood work) drawn today and your tests are completely normal, you will receive your results only by: ?MyChart Message (if you have MyChart) OR ?A paper copy in the mail ?If you have any lab test that is abnormal or we need to change your treatment, we will call you to review the results. ? ? ?Testing/Procedures: ?Will be schedule at [address] ?Non-Cardiac CT Angiography (CTA), is a special type of CT scan will look at your aorta.   that uses a computer to produce multi-dimensional views of major blood vessels throughout the body. In CT angiography, a contrast material is injected through an IV to help visualize the blood vessels  ? ? ? ?Follow-Up: ?At [facility], you and your health needs are our priority.  As part of our continuing mission to provide you with exceptional heart care, we have created designated Provider Care Teams.  These Care Teams include your primary Cardiologist (physician) and Advanced Practice Providers (APPs -  Physician Assistants and Nurse Practitioners) who all work together to provide you with the care you need, when you need it. ? ?We recommend signing up for the patient portal called "MyChart".  Sign up information is provided on this After Visit Summary.  MyChart is used to connect with patients for Virtual Visits (Telemedicine).  Patients are able to view lab/test results, encounter notes, upcoming appointments, etc.  Non-urgent messages can be sent to your  provider as well.   ?To learn more about what you can do with MyChart, go to [email or web address].   ? ?Your next appointment:   ?2 month(s) ? ?The format for your next appointment:   ?In Person ? ?Provider:   ?Bryan Lemma, MD   ? ? ?Other Instructions ? ? ?Important Information About Sugar ? ? ? ? ?  ?

## 2021-06-15 ENCOUNTER — Other Ambulatory Visit: Payer: Self-pay

## 2021-06-16 ENCOUNTER — Other Ambulatory Visit: Payer: Self-pay | Admitting: Family Medicine

## 2021-06-16 ENCOUNTER — Encounter: Payer: Self-pay | Admitting: Cardiology

## 2021-06-16 ENCOUNTER — Other Ambulatory Visit: Payer: Self-pay | Admitting: Nurse Practitioner

## 2021-06-16 ENCOUNTER — Other Ambulatory Visit: Payer: Self-pay

## 2021-06-16 DIAGNOSIS — I10 Essential (primary) hypertension: Secondary | ICD-10-CM

## 2021-06-16 DIAGNOSIS — E7800 Pure hypercholesterolemia, unspecified: Secondary | ICD-10-CM

## 2021-06-16 MED ORDER — ROSUVASTATIN CALCIUM 5 MG PO TABS
5.0000 mg | ORAL_TABLET | Freq: Every day | ORAL | 2 refills | Status: DC
  Filled 2021-06-16 (×2): qty 30, 30d supply, fill #0
  Filled 2021-08-20: qty 30, 30d supply, fill #1
  Filled 2021-09-17: qty 30, 30d supply, fill #2

## 2021-06-16 NOTE — Telephone Encounter (Signed)
Franky Macho could you take a look at this. I don't know if I should refill or not ?

## 2021-06-16 NOTE — Assessment & Plan Note (Signed)
Actually by exam, not unexpectedly moderate to severe stenosis.  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 ? ?Mean gradient is currently 27 mmHg on initial evaluation.  Would like to reassess echo in about 6 months to ensure that it is not increasing in size.  Concerning features also is that this is associated with a thoracic aortic aneurysm.  I explained to her that the decision about when and if to move forward with surgical repair with be either or related to the valve versus the aneurysm.  Went to refer to surgery will be based on the upcoming studies. ? ?We talked about the symptoms that are concerning for aortic stenosis including CHF and angina symptoms as well as syncope near syncope.  She has not had any of these.  I explained to her that this is very reassuring. ? ?Also explained that as part of the evaluation for this potential surgery, she would need ischemic evaluation, which I would be more inclined to perform using a Coronary CTA over cardiac catheterization. ?

## 2021-06-16 NOTE — Assessment & Plan Note (Addendum)
Blood pressure still up a little bit and she had some labile pressures. ?PCP recently increased amlodipine dose to 10 mg, but with aortic stenosis, would like to have a beta-blocker component. ? ?Plan: Continue current meds and add carvedilol 6.25 mg twice daily.  She will start off with taking 1/2 tablet twice daily for 10 days. ?-> She will adjust her dosing timing to take lisinopril in the morning and amlodipine in the evening with the planned carvedilol dose minimal. ?

## 2021-06-16 NOTE — Assessment & Plan Note (Signed)
Will defer to PCP.  Would like to see A1c down as low as possible. ?She is currently on glipizide and metformin.  Had been on Jardiance but is no longer present. ?In light of her cardiac disease, Jardiance or Marcelline Deist would unfavorable option. ? ?She is on rosuvastatin that was recently increased to 10 mg.  Need to see an improvement otherwise but increase to 20 mg. ?Continue Lovaza ?

## 2021-06-16 NOTE — Assessment & Plan Note (Signed)
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 ? ?Unfortunately this would potentially make it very difficult to consider the possibility of TAVR over open Bentall procedure.  I spent quite a bit of time explaining the difference in this procedure I explained the pathophysiology. ? ?Plan: ?? We will check Chest CTA-aorta (would prefer to go down to the abdomen pelvis.) ?? Needs pre-CT labs checked. ?? Better blood pressure control required. ->  She is already on amlodipine 10 mg daily along with lisinopril 20 mg daily. ?? We will start carvedilol 6.25 mg twice daily ?? Crestor was increased to 10 mg daily and will increase further to 20 mg. ?

## 2021-06-17 ENCOUNTER — Other Ambulatory Visit: Payer: Self-pay

## 2021-06-17 MED ORDER — AMLODIPINE BESYLATE 10 MG PO TABS
10.0000 mg | ORAL_TABLET | Freq: Every day | ORAL | 1 refills | Status: DC
  Filled 2021-06-17: qty 90, 90d supply, fill #0

## 2021-06-18 ENCOUNTER — Other Ambulatory Visit: Payer: Self-pay

## 2021-06-25 ENCOUNTER — Telehealth: Payer: Self-pay | Admitting: Nurse Practitioner

## 2021-06-25 NOTE — Telephone Encounter (Signed)
Call returned to patient. Verified I was speaking with her using two identifiers. Assured her that it was okay to take carvedilol and Tylenol concurrently. Advised against NSAID use at this time. Pt was amenable and thanked me for my call.  ?

## 2021-06-25 NOTE — Telephone Encounter (Signed)
Patient came stating that her tooth hurt and that she wants to take Tylenol for the tooth pain but she says she is scared to take Tylenol because she is not sure if it would affect her heart medication she started taking called Carvedilol 6.25 mg.  ?

## 2021-07-06 ENCOUNTER — Other Ambulatory Visit: Payer: Self-pay

## 2021-07-09 NOTE — Progress Notes (Signed)
S:    Erin Huff is a 66 y.o. female who presents for hypertension evaluation, education, and management. PMH is significant for T2DM, HTN, aortic stenosis, hypothyroidism, obesity. Patient was referred and last seen by Primary Care Provider, [name], on 05/12/2021. Amlodipine dose was increased at that appt. Last seen by CPP 06/10/21 when BP was close to goal. She was seen by her cardiologist on 06/14/21 who started carvedilol for slightly elevated BP and aortic stenosis.   Today, patient arrives in good spirits and presents accompanied by her daughter and granddaughter. Denies dizziness, headache, blurred vision, swelling. She reports that since increasing amlodipine from 5 mg to 10 mg she "feels altered". Asked to elaborate and she said she feels agitated when she takes it. She says she did well on the 5 mg dose previously. Later in the visit she admits she is splitting her amlodipine tablets in half to take 5 mg total. She explains this feeling has been happening since amlodipine was increased, and adding carvedilol had no effect. She is taking carvedilol with a meal.   Patient reports hypertension is longstanding.   Family/Social history:  -Fhx: HTN, DM -Tobacco: never smoker  -Alcohol: denies use   Medication adherence reported. Patient has taken lisinopril and carvedilol today. She takes her amlodipine in the evening and did take it last night.   Current antihypertensives include: amlodipine 10 mg daily (taking 5 mg), lisinopril 20 mg daily, carvedilol 6.25 mg BID  Reported home BP readings: uses upper arm cuff. Reports systolic readings of 125-130  Patient reported dietary habits:  -Compliant with sodium restriction.  -Denies drinking excessive caffeine.   Patient-reported exercise habits: walks, does Zumba every now and then   O:  Vitals:   07/12/21 1527  BP: 136/78  Pulse: 73   Last 3 Office BP readings: BP Readings from Last 3 Encounters:  07/12/21  136/78  06/14/21 140/78  06/10/21 134/80   BMET    Component Value Date/Time   NA 135 05/12/2021 1150   K 4.5 05/12/2021 1150   CL 101 05/12/2021 1150   CO2 17 (L) 05/12/2021 1150   GLUCOSE 233 (H) 05/12/2021 1150   GLUCOSE 99 09/26/2016 1603   BUN 14 05/12/2021 1150   CREATININE 0.73 05/12/2021 1150   CREATININE 0.81 03/01/2016 0956   CALCIUM 10.0 05/12/2021 1150   GFRNONAA 95 01/21/2020 1455   GFRAA 109 01/21/2020 1455   Renal function: CrCl cannot be calculated (Patient's most recent lab result is older than the maximum 21 days allowed.).  Clinical ASCVD: No  The 10-year ASCVD risk score (Arnett DK, et al., 2019) is: 10.9%   Values used to calculate the score:     Age: 39 years     Sex: Female     Is Non-Hispanic African American: No     Diabetic: Yes     Tobacco smoker: No     Systolic Blood Pressure: 136 mmHg     Is BP treated: Yes     HDL Cholesterol: 67 mg/dL     Total Cholesterol: 146 mg/dL  A/P: Hypertension longstanding currently close to goal on current medications. BP goal < 130/80 mmHg. Medication adherence appears appropriate. Since she reports adverse effects of agitation on amlodipine 10 mg, will decrease back to 5 mg per patient request which is how she is currently taking it, however BP is not at goal on this regimen. Discussed options of increasing lisinopril or carvedilol. Says she has taken lisinopril 30 mg in the  past and had to decrease due to "feeling bad." Patient refuses medication changes at this time.  -Continue current regimen.  -Counseled on lifestyle modifications for blood pressure control including reduced dietary sodium, increased exercise, adequate sleep. -Encouraged patient to check BP at home and bring log of readings to next visit. Counseled on proper use of home BP cuff.   Results reviewed and written information provided. Patient verbalized understanding of treatment plan. Total time in face-to-face counseling 25 minutes.   F/u  pharmacist clinic visit in 1 month. Next visit with PCP 09/10/21.   Pervis Hocking, PharmD PGY2 Ambulatory Care Pharmacy Resident 07/12/2021 3:42 PM

## 2021-07-12 ENCOUNTER — Encounter: Payer: Self-pay | Admitting: Pharmacist

## 2021-07-12 ENCOUNTER — Other Ambulatory Visit: Payer: Self-pay

## 2021-07-12 ENCOUNTER — Ambulatory Visit: Payer: Self-pay | Attending: Nurse Practitioner | Admitting: Pharmacist

## 2021-07-12 VITALS — BP 136/78 | HR 73

## 2021-07-12 DIAGNOSIS — I10 Essential (primary) hypertension: Secondary | ICD-10-CM

## 2021-07-12 MED ORDER — AMLODIPINE BESYLATE 5 MG PO TABS
5.0000 mg | ORAL_TABLET | Freq: Every day | ORAL | 3 refills | Status: DC
  Filled 2021-07-12: qty 30, 30d supply, fill #0
  Filled 2021-09-02: qty 30, 30d supply, fill #1

## 2021-07-21 ENCOUNTER — Other Ambulatory Visit: Payer: Self-pay

## 2021-07-22 ENCOUNTER — Other Ambulatory Visit: Payer: Self-pay | Admitting: Obstetrics and Gynecology

## 2021-07-22 DIAGNOSIS — Z1231 Encounter for screening mammogram for malignant neoplasm of breast: Secondary | ICD-10-CM

## 2021-07-23 ENCOUNTER — Other Ambulatory Visit: Payer: Self-pay

## 2021-07-26 ENCOUNTER — Other Ambulatory Visit: Payer: Self-pay

## 2021-07-26 ENCOUNTER — Other Ambulatory Visit: Payer: Self-pay | Admitting: Nurse Practitioner

## 2021-07-26 DIAGNOSIS — I10 Essential (primary) hypertension: Secondary | ICD-10-CM

## 2021-07-26 MED ORDER — LISINOPRIL 20 MG PO TABS
ORAL_TABLET | Freq: Every day | ORAL | 2 refills | Status: DC
  Filled 2021-07-26: qty 30, 30d supply, fill #0
  Filled 2021-08-20: qty 30, 30d supply, fill #1
  Filled 2021-09-17: qty 30, 30d supply, fill #2

## 2021-07-27 ENCOUNTER — Other Ambulatory Visit: Payer: Self-pay

## 2021-07-30 ENCOUNTER — Ambulatory Visit: Payer: No Typology Code available for payment source | Admitting: Cardiology

## 2021-07-31 NOTE — Progress Notes (Deleted)
Cardiology Office Note:    Date:  07/31/2021   ID:  Erin Huff, DOB December 20, 1955, MRN [id number]  PCP:  Claiborne Rigg, NP  Cardiologist:  Bryan Lemma, MD  Electrophysiologist:  None   Referring MD: Claiborne Rigg, NP   Chief Complaint: follow-up of hypertension, aortic stenosis, and thoracic aortic aneurysm  History of Present Illness:    Erin Huff is a 66 y.o. female with a history of moderate to severe aortic stenosis, thoracic aortic aneurysm, hypertension, hyperlipidemia, type 2 diabetes mellitus, GERD, and hypothyroidism who is followed by Dr. Herbie Baltimore and presents today for routine follow-up.  Patient was referred to Dr. Herbie Baltimore in 02/2021 for further evaluation of a murmur. Echo was ordered and showed LVEF of 60-65% with normal wall motion and grade 1 diastolic dysfunction as well as moderate to severe AS with mean gradient of 27.0 mmHg, moderate dilation of the ascending aorta measuring 46 mm, and mildly elevated PASP. She was last seen by Dr. Herbie Baltimore in 05/2021 at which time she was doing well from a cardiac standpoint. BP was mildly elevated so she was started on Coreg and Crestor was increase for additional lipid control. Chest CTA was ordered for further evaluation of thoracic aortic aneurysm and plan was for repeat Echo in 6 months for routine surveillance of AS.   Patient presents today for follow-up. CTA has not been done yet. ***  Moderate to Severe Aortic Stenosis Noted on recent Echo in 03/2021. Mean gradient 27.0 mmHg and peak gradient 46.9 mmHg. - Plan is to repeat Echo in 09/2021. Will order this today. ***  Thoracic Aortic Aneurysm Echo in 03/2021 showed moderate dilation of the ascending aorta measuring 46 mm.  Chest CTA was ordered at last visit but has not been completed yet. - Continue strict BP control. - ***  Hypertension BP *** - Continue current medications: Amlodipine 5mg  daily, Lisinopril 20mg  daily, and Coreg 6.25mg  twice  daily.  Hyperlipidemia Most recent lipid panel in 04/2021: Total Cholesterol 146, Triglycerides 128, HDL 67, LDL 57. - Continue Crestor 20mg  daily. - Lab followed by PCP.  Type 2 Diabetes Mellitus Hemoglobin A1c 8.1% in 04/2021.  - On Metformin and Glipizide. - Management per PCP.  Past Medical History:  Diagnosis Date   Allergy    Depression    mild   Diabetes mellitus without complication (HCC)    GERD (gastroesophageal reflux disease)    Hyperlipidemia    Hypertension    Moderate to severe aortic stenosis 03/2011   Moderate to severe AS noted on echo with mean gradient 27 mmHg.  Also noted moderate dilation of the ascending aorta-46 mm.   Thoracic aortic aneurysm (HCC) 04/06/2021   Measuring 46 mm on echo   Thyroid disease     Past Surgical History:  Procedure Laterality Date   NO PAST SURGERIES      Current Medications: No outpatient medications have been marked as taking for the 08/03/21 encounter (Appointment) with 04/2011, PA-C.   Current Facility-Administered Medications for the 08/03/21 encounter (Appointment) with 08/05/21, PA-C  Medication   0.9 %  sodium chloride infusion     Allergies:   Motrin [ibuprofen] and Penicillins   Social History   Socioeconomic History   Marital status: Married    Spouse name: Not on file   Number of children: 5   Years of education: Not on file   Highest education level: 2nd grade  Occupational History   Not on file  Tobacco Use  Smoking status: Never   Smokeless tobacco: Never  Vaping Use   Vaping Use: Never used  Substance and Sexual Activity   Alcohol use: No    Comment: rare    Drug use: No   Sexual activity: Yes    Birth control/protection: None  Other Topics Concern   Not on file  Social History Narrative   Sometimes husband is aggressive but she talks back and is aggressive with him. Does not take it.       Native of [place], but has been living in [place] area for [place]+ years.   Speaks Spanish only.      Usually active, enjoys doing Zumba exercise   Social Determinants of Health   Financial Resource Strain: Not on file  Food Insecurity: Not on file  Transportation Needs: No Transportation Needs (06/04/2019)   PRAPARE - Administrator, Civil ServiceTransportation    Lack of Transportation (Medical): No    Lack of Transportation (Non-Medical): No  Physical Activity: Insufficiently Active (05/09/2017)   Exercise Vital Sign    Days of Exercise per Week: 2 days    Minutes of Exercise per Session: 30 min  Stress: No Stress Concern Present (05/09/2017)   [employer] - Occupational Stress Questionnaire    Feeling of Stress : Not at all  Social Connections: Moderately Integrated (05/09/2017)   Social Connection and Isolation Panel [NHANES]    Frequency of Communication with Friends and Family: Three times a week    Frequency of Social Gatherings with Friends and Family: Once a week    Attends Religious Services: More than 4 times per year    Active Member of [employer]ubs or Organizations: No    Attends Engineer, structuralClub or Organization Meetings: Never    Marital Status: Married     Family History: The patient's family history includes Breast cancer in her maternal aunt; Diabetes in her brother; Hypertension in her sister. There is no history of Colon cancer, Colon polyps, Esophageal cancer, Rectal cancer, or Stomach cancer.  ROS:   Please see the history of present illness.     EKGs/Labs/Other Studies Reviewed:    The following studies were reviewed:  Echocardiogram 04/05/2021: Impressions:  1. DI - 0.31. Left ventricular ejection fraction, by estimation, is 60 to  65%. The left ventricle has normal function. The left ventricle has no  regional wall motion abnormalities. Left ventricular diastolic parameters  are consistent with Grade I  diastolic dysfunction (impaired relaxation).   2. Right ventricular systolic function is normal. The right ventricular  size is normal. There  is mildly elevated pulmonary artery systolic  pressure.   3. The mitral valve is normal in structure. No evidence of mitral valve  regurgitation. No evidence of mitral stenosis.   4. The aortic valve has an indeterminant number of cusps. There is  moderate calcification of the aortic valve. There is moderate thickening  of the aortic valve. Aortic valve regurgitation is not visualized.  Moderate to severe aortic valve stenosis.  Aortic valve area, by VTI measures 1.06 cm. Aortic valve mean gradient  measures 27.0 mmHg. Aortic valve Vmax measures 3.42 m/s.   5. Aortic dilatation noted. There is moderate dilatation of the ascending  aorta, measuring 46 mm.   6. The inferior vena cava is normal in size with greater than 50%  respiratory variability, suggesting right atrial pressure of 3 mmHg.   Conclusion(s)/Recommendation(s): Consider CTA of chest to evaluate aortic size.  EKG:  EKG not ordered today.   Recent Labs: 01/01/2021:  TSH 3.280 05/12/2021: ALT 20; BUN 14; Creatinine, Ser 0.73; Potassium 4.5; Sodium 135  Recent Lipid Panel    Component Value Date/Time   CHOL 146 05/12/2021 1150   TRIG 128 05/12/2021 1150   HDL 67 05/12/2021 1150   CHOLHDL 2.2 05/12/2021 1150   CHOLHDL 3.7 03/01/2016 0956   VLDL 47 (H) 03/01/2016 0956   LDLCALC 57 05/12/2021 1150    Physical Exam:    Vital Signs: There were no vitals taken for this visit.    Wt Readings from Last 3 Encounters:  06/14/21 172 lb (78 kg)  05/12/21 173 lb 8 oz (78.7 kg)  03/22/21 173 lb (78.5 kg)     General: 66 y.o. female in no acute distress. HEENT: Normocephalic and atraumatic. Sclera clear. EOMs intact. Neck: Supple. No carotid bruits. No JVD. Heart: *** RRR. Distinct S1 and S2. No murmurs, gallops, or rubs. Radial and distal pedal pulses 2+ and equal bilaterally. Lungs: No increased work of breathing. Clear to ausculation bilaterally. No wheezes, rhonchi, or rales.  Abdomen: Soft, non-distended, and non-tender  to palpation. Bowel sounds present in all 4 quadrants.  MSK: Normal strength and tone for age. *** Extremities: No lower extremity edema.    Skin: Warm and dry. Neuro: Alert and oriented x3. No focal deficits. Psych: Normal affect. Responds appropriately.   Assessment:    No diagnosis found.  Plan:     Disposition: Follow up in ***   Medication Adjustments/Labs and Tests Ordered: Current medicines are reviewed at length with the patient today.  Concerns regarding medicines are outlined above.  No orders of the defined types were placed in this encounter.  No orders of the defined types were placed in this encounter.   There are no Patient Instructions on file for this visit.   Signed, Corrin Parker, PA-C  07/31/2021 11:33 AM    [facility]

## 2021-08-03 ENCOUNTER — Ambulatory Visit: Payer: No Typology Code available for payment source | Admitting: Student

## 2021-08-05 ENCOUNTER — Encounter: Payer: Self-pay | Admitting: Nurse Practitioner

## 2021-08-05 ENCOUNTER — Ambulatory Visit (INDEPENDENT_AMBULATORY_CARE_PROVIDER_SITE_OTHER): Payer: Self-pay | Admitting: Nurse Practitioner

## 2021-08-05 VITALS — BP 140/78 | HR 72 | Ht 60.0 in | Wt 170.8 lb

## 2021-08-05 DIAGNOSIS — I350 Nonrheumatic aortic (valve) stenosis: Secondary | ICD-10-CM

## 2021-08-05 DIAGNOSIS — E782 Mixed hyperlipidemia: Secondary | ICD-10-CM

## 2021-08-05 DIAGNOSIS — I7121 Aneurysm of the ascending aorta, without rupture: Secondary | ICD-10-CM

## 2021-08-05 DIAGNOSIS — I10 Essential (primary) hypertension: Secondary | ICD-10-CM

## 2021-08-05 DIAGNOSIS — E1165 Type 2 diabetes mellitus with hyperglycemia: Secondary | ICD-10-CM

## 2021-08-05 NOTE — Patient Instructions (Signed)
Medication Instructions:  Your physician recommends that you continue on your current medications as directed. Please refer to the Current Medication list given to you today.  *If you need a refill on your cardiac medications before your next appointment, please call your pharmacy*  Lab Work: NONE ordered at this time of appointment   If you have labs (blood work) drawn today and your tests are completely normal, you will receive your results only by: MyChart Message (if you have MyChart) OR A paper copy in the mail If you have any lab test that is abnormal or we need to change your treatment, we will call you to review the results.  Testing/Procedures: NONE ordered at this time of appointment    Follow-Up: At [facility], you and your health needs are our priority.  As part of our continuing mission to provide you with exceptional heart care, we have created designated Provider Care Teams.  These Care Teams include your primary Cardiologist (physician) and Advanced Practice Providers (APPs -  Physician Assistants and Nurse Practitioners) who all work together to provide you with the care you need, when you need it.  We recommend signing up for the patient portal called "MyChart".  Sign up information is provided on this After Visit Summary.  MyChart is used to connect with patients for Virtual Visits (Telemedicine).  Patients are able to view lab/test results, encounter notes, upcoming appointments, etc.  Non-urgent messages can be sent to your provider as well.   To learn more about what you can do with MyChart, go to [email or web address].    Your next appointment:   4-6 month(s)  The format for your next appointment:   In Person  Provider:   Bryan Lemma, MD     Other Instructions   Important Information About Sugar

## 2021-08-05 NOTE — Progress Notes (Signed)
Office Visit    Patient Name: Erin Huff Date of Encounter: 08/05/2021  Primary Care Provider:  Gildardo Pounds, NP Primary Cardiologist:  Glenetta Hew, MD  Chief Complaint    66 year old female with a history of severe aortic stenosis, thoracic aortic aneurysm, hypertension, hyperlipidemia, type 2 diabetes, GERD, and depression who presents for follow-up related to aortic stenosis and hypertension.  Past Medical History    Past Medical History:  Diagnosis Date   Allergy    Depression    mild   Diabetes mellitus without complication (HCC)    GERD (gastroesophageal reflux disease)    Hyperlipidemia    Hypertension    Moderate to severe aortic stenosis 03/2011   Moderate to severe AS noted on echo with mean gradient 27 mmHg.  Also noted moderate dilation of the ascending aorta-46 mm.   Thoracic aortic aneurysm ([place]) 04/06/2021   Measuring 46 mm on echo   Thyroid disease    Past Surgical History:  Procedure Laterality Date   NO PAST SURGERIES      Allergies  Allergies  Allergen Reactions   Motrin [Ibuprofen] Rash   Penicillins Rash    History of Present Illness    66 year old female with the above past medical history including severe aortic stenosis, thoracic aortic aneurysm, hypertension, hyperlipidemia, type 2 diabetes, GERD, and depression.  Initially referred to Dr. Ellyn Hack in January 2023 in the setting of tachycardia, heart murmur. Echocardiogram in February 2023 showed EF 60 to 65%, G1 DD, normal RV size, mildly elevated RVSP, normal RAP, moderate to severe aortic stenosis with mean gradient of 27 mmHg, moderate dilation of ascending aorta measuring 46 mm.  She was last seen in the office on 06/14/2021 stable overall from a cardiac standpoint.  BP was slightly elevated above goal, she was started on carvedilol.  Echocardiogram was recommended in 6 months. CT angio chest/aorta was ordered but was never completed.  She presents today for  follow-up accompanied by her daughter and grandchildren.  Since her last visit she has been stable from a cardiac standpoint.  She does note some mild ongoing fatigue if she walks long distances, she denies dizziness, dyspnea, chest pain, edema, PND, orthopnea, weight gain.  Her BP has been well controlled overall.  He did not go through with the CT chest/aorta because she was concerned about potential side effects/risks.  She also states she never received a phone call to schedule this.  Other than ongoing mild fatigue with exertion, she denies any additional concerns today.  Home Medications    Current Outpatient Medications  Medication Sig Dispense Refill   amLODipine (NORVASC) 5 MG tablet Take 1 tablet (5 mg total) by mouth daily. 30 tablet 3   Blood Glucose Monitoring Suppl (TRUE METRIX METER) w/Device KIT Use as directed 1 kit 0   carvedilol (COREG) 6.25 MG tablet Take 1 tablet (6.25 mg total) by mouth 2 (two) times daily. 180 tablet 3   diclofenac Sodium (VOLTAREN) 1 % GEL Apply topically as needed.     glipiZIDE (GLUCOTROL XL) 10 MG 24 hr tablet Take 2 tablets (20 mg total) by mouth daily with breakfast. 180 tablet 1   ibuprofen (ADVIL) 600 MG tablet Take 600 mg by mouth as needed.     levothyroxine (SYNTHROID) 100 MCG tablet TAKE 1 TABLET (100 MCG TOTAL) BY MOUTH DAILY BEFORE BREAKFAST. 30 tablet 2   lisinopril (ZESTRIL) 20 MG tablet TAKE 1 TABLET (20 MG TOTAL) BY MOUTH DAILY. 30 tablet 2  loratadine (CLARITIN) 10 MG tablet Take 1 tablet (10 mg total) by mouth daily. 90 tablet 3   metFORMIN (GLUCOPHAGE) 1000 MG tablet TAKE 1 TABLET (1,000 MG TOTAL) BY MOUTH 2 (TWO) TIMES DAILY WITH A MEAL. 180 tablet 1   omega-3 acid ethyl esters (LOVAZA) 1 g capsule TAKE 2 CAPSULES (2 G TOTAL) BY MOUTH 2 (TWO) TIMES DAILY. 360 capsule 1   omeprazole (PRILOSEC) 20 MG capsule Take 1 capsule (20 mg total) by mouth daily as needed. PARA ACIDO 30 capsule 1   OVER THE COUNTER MEDICATION nutrilite carb blocker  as needed- helps with constipation     rosuvastatin (CRESTOR) 5 MG tablet Take 1 tablet (5 mg total) by mouth daily. 30 tablet 2   TRUEplus Lancets 28G MISC Use as directed 100 each 5   Vitamin D, Ergocalciferol, (DRISDOL) 1.25 MG (50000 UNIT) CAPS capsule Take 1 capsule (50,000 Units total) by mouth every 7 (seven) days. 12 capsule 1   Multiple Vitamin (MULTIVITAMIN) tablet Take 1 tablet by mouth daily. (Patient not taking: Reported on 08/05/2021)     Current Facility-Administered Medications  Medication Dose Route Frequency Provider Last Rate Last Admin   0.9 %  sodium chloride infusion  500 mL Intravenous Continuous Pyrtle, Lajuan Lines, MD         Review of Systems    She denies chest pain, palpitations, dyspnea, pnd, orthopnea, n, v, dizziness, syncope, edema, weight gain, or early satiety. All other systems reviewed and are otherwise negative except as noted above.   Physical Exam    VS:  BP 140/78   Pulse 72   Ht 5' (1.524 m)   Wt 170 lb 12.8 oz (77.5 kg)   SpO2 100%   BMI 33.36 kg/m  GEN: Well nourished, well developed, in no acute distress. HEENT: normal. Neck: Supple, no JVD, carotid bruits, or masses. Cardiac: RRR, 2/6 murmur, no rubs, or gallops. No clubbing, cyanosis, edema.  Radials/DP/PT 2+ and equal bilaterally.  Respiratory:  Respirations regular and unlabored, clear to auscultation bilaterally. GI: Soft, nontender, nondistended, BS + x 4. MS: no deformity or atrophy. Skin: warm and dry, no rash. Neuro:  Strength and sensation are intact. Psych: Normal affect.  Accessory Clinical Findings    ECG personally reviewed by me today - No EKG in office today.  Lab Results  Component Value Date   WBC 8.7 07/30/2020   HGB 12.1 07/30/2020   HCT 37.1 07/30/2020   MCV 90 07/30/2020   PLT 231 07/30/2020   Lab Results  Component Value Date   CREATININE 0.73 05/12/2021   BUN 14 05/12/2021   NA 135 05/12/2021   K 4.5 05/12/2021   CL 101 05/12/2021   CO2 17 (L)  05/12/2021   Lab Results  Component Value Date   ALT 20 05/12/2021   AST 20 05/12/2021   ALKPHOS 125 (H) 05/12/2021   BILITOT 0.2 05/12/2021   Lab Results  Component Value Date   CHOL 146 05/12/2021   HDL 67 05/12/2021   LDLCALC 57 05/12/2021   TRIG 128 05/12/2021   CHOLHDL 2.2 05/12/2021    Lab Results  Component Value Date   HGBA1C 8.1 (A) 05/12/2021    Assessment & Plan    1. Aortic stenosis: Echocardiogram in February 2023 showed EF 60 to 65%, G1 DD, normal RV size, mildly elevated RVSP, normal RAP, moderate to severe aortic stenosis with mean gradient of 27 mmHg, moderate dilation of ascending aorta measuring 46 mm. Euvolemic and well compensated  on exam.  Denies dizziness, presyncope, syncope. Plan for repeat echo in 4-6 months.  In considering future management, she would require ischemic evaluation prior to any surgical intervention (per Dr. Allison Quarry note in April 2023, would favor coronary CT angiogram over cardiac catheterization).  Additionally, given the presence of thoracic aortic aneurysm on most recent echocardiogram, would make it more difficult to consider the possibility of TAVR over open Bentall procedure.   2. Thoracic aortic aneurysm: Measured 46 mm on most recent echo as above. CT of the chest/aorta was ordered at last visit but not completed.  Will schedule today.   3. Hypertension: BP overall well controlled. She reports home BP in the 120s-130s/70s. Continue to monitor BP at home and report BP consistenlty > 130/80. Continue current antihypertensive regimen.   4. Hyperlipidemia: LDL was 57 in 04/2021. Continue Crestor, Lovaza.   5. Type 2 diabetes: A1C was 7.4 in 01/2021. Monitored and managed per PCP.   6. Disposition: Follow-up in 4 months with Dr. Ellyn Hack.   Lenna Sciara, NP 08/05/2021, 4:44 PM

## 2021-08-10 ENCOUNTER — Other Ambulatory Visit: Payer: Self-pay

## 2021-08-11 ENCOUNTER — Other Ambulatory Visit: Payer: Self-pay

## 2021-08-12 LAB — BASIC METABOLIC PANEL
BUN/Creatinine Ratio: 16 (ref 12–28)
BUN: 12 mg/dL (ref 8–27)
CO2: 20 mmol/L (ref 20–29)
Calcium: 9.4 mg/dL (ref 8.7–10.3)
Chloride: 99 mmol/L (ref 96–106)
Creatinine, Ser: 0.77 mg/dL (ref 0.57–1.00)
Glucose: 272 mg/dL — ABNORMAL HIGH (ref 70–99)
Potassium: 4.7 mmol/L (ref 3.5–5.2)
Sodium: 134 mmol/L (ref 134–144)
eGFR: 86 mL/min/{1.73_m2} (ref >59)

## 2021-08-13 ENCOUNTER — Ambulatory Visit: Payer: No Typology Code available for payment source | Admitting: Nurse Practitioner

## 2021-08-16 ENCOUNTER — Encounter (HOSPITAL_COMMUNITY): Payer: Self-pay

## 2021-08-16 ENCOUNTER — Ambulatory Visit (HOSPITAL_COMMUNITY)
Admission: RE | Admit: 2021-08-16 | Discharge: 2021-08-16 | Disposition: A | Discharge status: Home / Self Care | Payer: No Typology Code available for payment source | Source: Ambulatory Visit | Attending: Cardiology | Admitting: Cardiology

## 2021-08-16 DIAGNOSIS — E1165 Type 2 diabetes mellitus with hyperglycemia: Secondary | ICD-10-CM | POA: Insufficient documentation

## 2021-08-16 DIAGNOSIS — I350 Nonrheumatic aortic (valve) stenosis: Secondary | ICD-10-CM | POA: Insufficient documentation

## 2021-08-16 MED ORDER — IOHEXOL 350 MG/ML SOLN
80.0000 mL | Freq: Once | INTRAVENOUS | Status: AC | PRN
  Administered 2021-08-16: 80 mL via INTRAVENOUS

## 2021-08-20 ENCOUNTER — Other Ambulatory Visit: Payer: Self-pay

## 2021-08-20 ENCOUNTER — Telehealth: Payer: Self-pay | Admitting: *Deleted

## 2021-08-20 NOTE — Telephone Encounter (Signed)
-----   Message from Marykay Lex, MD sent at 08/17/2021 11:14 PM EDT ----- Chest CT results:  Good news about the aorta.  The dilated aorta is only 4.2 x 4.2 cm.  She is a lot better than we thought it was going to be. => We can follow this up with manual CT scan.  The Echocardiogram likely "over-called" the extent of dilation.  We will also be following the Aortic valve disease.   Bryan Lemma, MD

## 2021-08-20 NOTE — Telephone Encounter (Signed)
Left message with assistance of pacific interpreters- "Erin Huff #[id number]"  Left message for patient to call back  to give results  results

## 2021-08-20 NOTE — Telephone Encounter (Signed)
-----   Message from Marykay Lex, MD sent at 08/12/2021  9:47 PM EDT ----- Chemistry panel shows normal kidney function and electrolytes.  Only issue is blood sugars are still elevated.

## 2021-08-30 ENCOUNTER — Encounter: Payer: Self-pay | Admitting: *Deleted

## 2021-09-02 ENCOUNTER — Other Ambulatory Visit: Payer: Self-pay

## 2021-09-02 ENCOUNTER — Other Ambulatory Visit: Payer: Self-pay | Admitting: Family Medicine

## 2021-09-02 ENCOUNTER — Ambulatory Visit: Payer: Self-pay | Admitting: *Deleted

## 2021-09-02 ENCOUNTER — Ambulatory Visit
Admission: RE | Admit: 2021-09-02 | Discharge: 2021-09-02 | Disposition: A | Discharge status: Home / Self Care | Payer: No Typology Code available for payment source | Source: Ambulatory Visit | Attending: Obstetrics and Gynecology | Admitting: Obstetrics and Gynecology

## 2021-09-02 VITALS — BP 158/80 | Wt 167.7 lb

## 2021-09-02 DIAGNOSIS — Z01419 Encounter for gynecological examination (general) (routine) without abnormal findings: Secondary | ICD-10-CM

## 2021-09-02 DIAGNOSIS — Z1211 Encounter for screening for malignant neoplasm of colon: Secondary | ICD-10-CM

## 2021-09-02 DIAGNOSIS — Z1231 Encounter for screening mammogram for malignant neoplasm of breast: Secondary | ICD-10-CM

## 2021-09-02 DIAGNOSIS — E039 Hypothyroidism, unspecified: Secondary | ICD-10-CM

## 2021-09-02 MED ORDER — LEVOTHYROXINE SODIUM 100 MCG PO TABS
ORAL_TABLET | ORAL | 1 refills | Status: DC
  Filled 2021-09-02: qty 90, 90d supply, fill #0
  Filled 2021-11-19: qty 90, 90d supply, fill #1

## 2021-09-02 NOTE — Patient Instructions (Signed)
Explained breast self awareness with Erin Huff. Pap smear completed today. Let her know that if today's Pap smear is normal and HPV negative that her next Pap smear will be due in one year due to her history of abnormal Pap smears. Referred patient to the [facility] for a screening mammogram on mobile unit. Appointment scheduled Thursday, September 02, 2021 at 1450. Patient aware of appointment and will be there. Let patient know will follow up with her within the next couple weeks with results of Pap smear by phone. Informed patient that the Breast Center will follow up with her within the next couple of weeks with results of her mammogram by letter or phone. [name] verbalized understanding.  Miliyah Luper, Kathaleen Maser, RN 2:29 PM

## 2021-09-02 NOTE — Telephone Encounter (Signed)
Requested Prescriptions  Pending Prescriptions Disp Refills  . levothyroxine (SYNTHROID) 100 MCG tablet 90 tablet 1    Sig: TAKE 1 TABLET (100 MCG TOTAL) BY MOUTH DAILY BEFORE BREAKFAST.     Endocrinology:  Hypothyroid Agents Passed - 09/02/2021  3:21 PM      Passed - TSH in normal range and within 360 days    TSH  Date Value Ref Range Status  01/01/2021 3.280 0.450 - 4.500 uIU/mL Final         Passed - Valid encounter within last 12 months    Recent Outpatient Visits          1 month ago Essential hypertension   [facility] Lois Huxley, Cornelius Moras, RPH-CPP   2 months ago Primary hypertension   [facility] [place], Cornelius Moras, RPH-CPP   3 months ago Primary hypertension   [facility] [address] [facility] [place], [place] W, NP   5 months ago Uncontrolled type 2 diabetes mellitus with hyperglycemia, without long-term current use of insulin [facility])   [place] [place], Jeannett Senior L, RPH-CPP   6 months ago Uncontrolled type 2 diabetes mellitus with hyperglycemia, without long-term current use of insulin [facility])   [facility] Drucilla Chalet, RPH-CPP      Future Appointments            In 1 week Claiborne Rigg, NP [facility]   In 4 months Herbie Baltimore, Piedad Climes, MD [facility], [facility]

## 2021-09-02 NOTE — Progress Notes (Signed)
Erin Huff is a 66 y.o. G5P0 female who presents to [facility] clinic today with no complaints.    Pap Smear: Pap smear completed today. 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 Patient has a history of two other abnormal Pap smears 06/04/2019 that was LSIL with positive HPV that a follow up Pap smear in one year was completed for follow up and 03/31/2017 that was LSIL with positive HPV that  a Colposcopy was completed for follow-up 06/02/2017 that showed CIN 1. Last three Pap smear results and two colposcopy results are available in Epic.   Physical exam: Breasts Breasts symmetrical. No skin abnormalities bilateral breasts. No nipple retraction bilateral breasts. No nipple discharge bilateral breasts. No lymphadenopathy. No lumps palpated bilateral breasts. No complaints of pain or tenderness on exam.     MS DIGITAL SCREENING TOMO BILATERAL  Result Date: 03/25/2020 CLINICAL DATA:  Screening. EXAM: DIGITAL SCREENING BILATERAL MAMMOGRAM WITH TOMO AND CAD COMPARISON:  Previous exam(s). ACR Breast Density Category b: There are scattered areas of fibroglandular density. FINDINGS: There are no findings suspicious for malignancy. The images were evaluated with computer-aided detection. IMPRESSION: No mammographic evidence of malignancy. A result letter of this screening mammogram will be mailed directly to the patient. RECOMMENDATION: Screening mammogram in one year. (Code:SM-B-01Y) BI-RADS CATEGORY  1: Negative. Electronically Signed   By: Erin Huff M.D.   On: 03/25/2020 10:44   MS DIGITAL SCREENING TOMO BILATERAL  Result Date: 01/15/2019 CLINICAL DATA:  Screening. EXAM: DIGITAL SCREENING BILATERAL MAMMOGRAM WITH TOMO AND CAD COMPARISON:  Previous exam(s). ACR Breast Density Category b: There are scattered areas of fibroglandular density. FINDINGS: There are no  findings suspicious for malignancy. Images were processed with CAD. IMPRESSION: No mammographic evidence of malignancy. A result letter of this screening mammogram will be mailed directly to the patient. RECOMMENDATION: Screening mammogram in one year. (Code:SM-B-01Y) BI-RADS CATEGORY  1: Negative. Electronically Signed   By: Erin Huff M.D.   On: 01/15/2019 10:26   MS DIGITAL SCREENING TOMO BILATERAL  Result Date: 05/09/2017 CLINICAL DATA:  Screening. EXAM: DIGITAL SCREENING BILATERAL MAMMOGRAM WITH TOMO AND CAD COMPARISON:  Previous exam(s). ACR Breast Density Category b: There are scattered areas of fibroglandular density. FINDINGS: There are no findings suspicious for malignancy. Images were processed with CAD. IMPRESSION: No mammographic evidence of malignancy. A result letter of this screening mammogram will be mailed directly to the patient. RECOMMENDATION: Screening mammogram in one year. (Code:SM-B-01Y) BI-RADS CATEGORY  1: Negative. Electronically Signed   By: Erin Huff M.D.   On: 05/09/2017 14:34     Pelvic/Bimanual Ext Genitalia No lesions, no swelling and no discharge observed on external genitalia.        Vagina Vagina pink and normal texture. No lesions or discharge observed in vagina.        Cervix Cervix is present. Cervix pink and of normal texture. No discharge observed.    Uterus Uterus is present and palpable. Uterus in normal position and normal size.        Adnexae Bilateral ovaries present and palpable. No tenderness on palpation.         Rectovaginal No rectal exam completed today since patient had no rectal complaints. No skin abnormalities observed on exam.     Smoking History: Patient has never smoked.   Patient Navigation: Patient education provided. Access to services provided for patient through [facility]  program. Spanish interpreter Erin Huff from [facility] provided.   Colorectal Cancer Screening: Patient had a colonoscopy completed 08/02/2016.  FIT Test will be mailed to patient to complete. No complaints today.    Breast and Cervical Cancer Risk Assessment: Patient has a family history of a maternal aunt being diagnosed with breast cancer. Patient has no known genetic mutations or history radiation treatment to the chest before age 30. Patient has history of cervical dysplasia. Patient has no history of being immunocompromised or DES exposure in-utero.  Risk Assessment     Risk Scores       09/02/2021 06/04/2019   Last edited by: Erin Huff, CMA Erin Huff, Erin Demetrius Charity, Erin Huff   5-year risk: 0.9 % 0.9 %   Lifetime risk: 4 % 4.3 %            A: [facility] exam with pap smear No complaints.  P: Referred patient to the [facility] for a screening mammogram on mobile unit. Appointment scheduled Thursday, September 02, 2021 at 1450.  Erin Heidelberg, RN 09/02/2021 2:29 PM

## 2021-09-03 ENCOUNTER — Other Ambulatory Visit: Payer: Self-pay

## 2021-09-09 LAB — CYTOLOGY - PAP
Comment: NEGATIVE
Diagnosis: NEGATIVE
Diagnosis: REACTIVE
High risk HPV: NEGATIVE

## 2021-09-10 ENCOUNTER — Encounter: Payer: Self-pay | Admitting: Nurse Practitioner

## 2021-09-10 ENCOUNTER — Ambulatory Visit: Payer: Self-pay | Attending: Nurse Practitioner | Admitting: Nurse Practitioner

## 2021-09-10 ENCOUNTER — Other Ambulatory Visit: Payer: Self-pay

## 2021-09-10 ENCOUNTER — Telehealth: Payer: Self-pay

## 2021-09-10 VITALS — BP 152/83 | HR 70 | Wt 172.2 lb

## 2021-09-10 DIAGNOSIS — L209 Atopic dermatitis, unspecified: Secondary | ICD-10-CM

## 2021-09-10 DIAGNOSIS — I10 Essential (primary) hypertension: Secondary | ICD-10-CM

## 2021-09-10 DIAGNOSIS — E1165 Type 2 diabetes mellitus with hyperglycemia: Secondary | ICD-10-CM

## 2021-09-10 DIAGNOSIS — D72829 Elevated white blood cell count, unspecified: Secondary | ICD-10-CM

## 2021-09-10 DIAGNOSIS — E039 Hypothyroidism, unspecified: Secondary | ICD-10-CM

## 2021-09-10 DIAGNOSIS — R300 Dysuria: Secondary | ICD-10-CM

## 2021-09-10 LAB — POCT URINALYSIS DIP (CLINITEK)
Bilirubin, UA: NEGATIVE
Blood, UA: NEGATIVE
Glucose, UA: NEGATIVE mg/dL
Ketones, POC UA: NEGATIVE mg/dL
Nitrite, UA: NEGATIVE
POC PROTEIN,UA: NEGATIVE
Spec Grav, UA: 1.01 (ref 1.010–1.025)
Urobilinogen, UA: 0.2 E.U./dL
pH, UA: 6.5 (ref 5.0–8.0)

## 2021-09-10 LAB — POCT GLYCOSYLATED HEMOGLOBIN (HGB A1C): HbA1c, POC (controlled diabetic range): 7.3 % — AB (ref 0.0–7.0)

## 2021-09-10 LAB — GLUCOSE, POCT (MANUAL RESULT ENTRY): POC Glucose: 174 mg/dl — AB (ref 70–99)

## 2021-09-10 MED ORDER — TRIAMCINOLONE ACETONIDE 0.1 % EX CREA
1.0000 | TOPICAL_CREAM | Freq: Two times a day (BID) | CUTANEOUS | 1 refills | Status: DC
  Filled 2021-09-10: qty 60, 30d supply, fill #0

## 2021-09-10 MED ORDER — GLIPIZIDE ER 10 MG PO TB24
20.0000 mg | ORAL_TABLET | Freq: Every day | ORAL | 1 refills | Status: DC
  Filled 2021-09-10 – 2021-10-11 (×2): qty 180, 90d supply, fill #0

## 2021-09-10 NOTE — Telephone Encounter (Signed)
Via Delorise Royals, Spanish Interpreter [facility]), Patient informed negative Pap/HPV-results, given history of abnormal pap smears, needs to repeat pap smear in 1 year. Patient verbalized understanding.

## 2021-09-10 NOTE — Progress Notes (Unsigned)
Assessment & Plan:  Erin Huff was seen today for diabetes.  Diagnoses and all orders for this visit:  Primary hypertension -     CMP14+EGFR DOSAGE CHANGE- amLODipine (NORVASC) 10 MG tablet; Take 1 tablet (10 mg total) by mouth daily.  Uncontrolled type 2 diabetes mellitus with hyperglycemia, without long-term current use of insulin (HCC) -     POCT glucose (manual entry) -     POCT glycosylated hemoglobin (Hb A1C) -     CMP14+EGFR -     glipiZIDE (GLUCOTROL XL) 10 MG 24 hr tablet; Take 2 tablets (20 mg total) by mouth daily with breakfast.  Hypothyroidism, unspecified type -     Thyroid Panel With TSH  Leukocytosis, unspecified type -     CBC  Atopic dermatitis, unspecified type -     triamcinolone cream (KENALOG) 0.1 %; Apply 1 Application topically 2 (two) times daily.  Burning with urination -     POCT URINALYSIS DIP (CLINITEK)    Patient has been counseled on age-appropriate routine health concerns for screening and prevention. These are reviewed and up-to-date. Referrals have been placed accordingly. Immunizations are up-to-date or declined.    Subjective:   Chief Complaint  Patient presents with   Diabetes   HPI Erin Huff 66 y.o. female presents to office today for follow up to DM and HTN.   She has a past medical history of Allergy, Depression, DM2, GERD, Hyperlipidemia, Hypertension, Moderate to severe aortic stenosis (03/2011), Thoracic aortic aneurysm (04/06/2021), and Thyroid disease.   VRI was used to communicate directly with patient for the entire encounter including providing detailed patient instructions.     UTI symptoms Endorses dysuria and feeling of incomplete bladder emptying. Started several months ago. Symptoms are intermittent.   Hypertension She is not exercising and is not adherent to low salt diet. Blood pressure at home are 130/70s.  She does not have a blood pressure log  today and does not monitor her blood pressure at  home.   Blood pressure is not well controlled today. She is currently prescribed carvedilol 6.25 mg BID, amlodipine 5 mg daily and lisinopril 20 mg daily. Will increase amlodipine today to $Remove'10mg'[id number]$ .  BP Readings from Last 3 Encounters:  09/10/21 (!) 152/83  09/02/21 (!) 158/80  08/05/21 140/78    Diabetes Mellitus Type II Known diabetic complications: none Cardiovascular risk factors: diabetes mellitus, dyslipidemia, and hypertension Current diabetic medications include:  Eye exam current (within one year): no Weight trend: stable Prior visit with dietician: no Current monitoring regimen: office lab tests - 4 times yearly Home blood sugar records: fasting range: 130-140s Any episodes of hypoglycemia? no Is She on ACE inhibitor or angiotensin II receptor blocker?  Yes  LDL at goal with crestor 5 units daily.  Lab Results  Component Value Date   HGBA1C 7.3 (A) 09/10/2021   HGBA1C 8.1 (A) 05/12/2021   HGBA1C 7.3 (A) 02/10/2021   Hypothyroidism Denies any symptoms of hyper or hypothyroidism. Taking levothyroxine 100 mg daily as prescribed.  Lab Results  Component Value Date   TSH 1.570 09/10/2021         ROS  Past Medical History:  Diagnosis Date   Allergy    Depression    mild   Diabetes mellitus without complication (HCC)    GERD (gastroesophageal reflux disease)    Hyperlipidemia    Hypertension    Moderate to severe aortic stenosis 03/2011   Moderate to severe AS noted on echo with mean gradient 27 mmHg.  Also noted moderate dilation of the ascending aorta-46 mm.   Thoracic aortic aneurysm ([place]) 04/06/2021   Measuring 46 mm on echo   Thyroid disease     Past Surgical History:  Procedure Laterality Date   NO PAST SURGERIES      Family History  Problem Relation Age of Onset   Hypertension Sister    Diabetes Brother    Breast cancer Maternal Aunt    Colon cancer Neg Hx    Colon polyps Neg Hx    Esophageal cancer Neg Hx    Rectal cancer Neg Hx    Stomach  cancer Neg Hx     Social History Reviewed with no changes to be made today.   Outpatient Medications Prior to Visit  Medication Sig Dispense Refill   Blood Glucose Monitoring Suppl (TRUE METRIX METER) w/Device KIT Use as directed 1 kit 0   carvedilol (COREG) 6.25 MG tablet Take 1 tablet (6.25 mg total) by mouth 2 (two) times daily. 180 tablet 3   diclofenac Sodium (VOLTAREN) 1 % GEL Apply topically as needed.     ibuprofen (ADVIL) 600 MG tablet Take 600 mg by mouth as needed.     levothyroxine (SYNTHROID) 100 MCG tablet TAKE 1 TABLET (100 MCG TOTAL) BY MOUTH DAILY BEFORE BREAKFAST. 90 tablet 1   lisinopril (ZESTRIL) 20 MG tablet TAKE 1 TABLET (20 MG TOTAL) BY MOUTH DAILY. 30 tablet 2   loratadine (CLARITIN) 10 MG tablet Take 1 tablet (10 mg total) by mouth daily. 90 tablet 3   metFORMIN (GLUCOPHAGE) 1000 MG tablet TAKE 1 TABLET (1,000 MG TOTAL) BY MOUTH 2 (TWO) TIMES DAILY WITH A MEAL. 180 tablet 1   Multiple Vitamin (MULTIVITAMIN) tablet Take 1 tablet by mouth daily.     OVER THE COUNTER MEDICATION nutrilite carb blocker as needed- helps with constipation     rosuvastatin (CRESTOR) 5 MG tablet Take 1 tablet (5 mg total) by mouth daily. 30 tablet 2   TRUEplus Lancets 28G MISC Use as directed 100 each 5   Vitamin D, Ergocalciferol, (DRISDOL) 1.25 MG (50000 UNIT) CAPS capsule Take 1 capsule (50,000 Units total) by mouth every 7 (seven) days. 12 capsule 1   amLODipine (NORVASC) 5 MG tablet Take 1 tablet (5 mg total) by mouth daily. 30 tablet 3   glipiZIDE (GLUCOTROL XL) 10 MG 24 hr tablet Take 2 tablets (20 mg total) by mouth daily with breakfast. 180 tablet 1   omega-3 acid ethyl esters (LOVAZA) 1 g capsule TAKE 2 CAPSULES (2 G TOTAL) BY MOUTH 2 (TWO) TIMES DAILY. 360 capsule 1   omeprazole (PRILOSEC) 20 MG capsule Take 1 capsule (20 mg total) by mouth daily as needed. PARA ACIDO 30 capsule 1   Facility-Administered Medications Prior to Visit  Medication Dose Route Frequency Provider Last  Rate Last Admin   0.9 %  sodium chloride infusion  500 mL Intravenous Continuous Pyrtle, Lajuan Lines, MD        Allergies  Allergen Reactions   Motrin [Ibuprofen] Rash   Penicillins Rash       Objective:    BP (!) 152/83   Pulse 70   Wt 172 lb 3.2 oz (78.1 kg)   SpO2 96%   BMI 33.63 kg/m  Wt Readings from Last 3 Encounters:  09/10/21 172 lb 3.2 oz (78.1 kg)  09/02/21 167 lb 11.2 oz (76.1 kg)  08/05/21 170 lb 12.8 oz (77.5 kg)    Physical Exam       Patient has  been counseled extensively about nutrition and exercise as well as the importance of adherence with medications and regular follow-up. The patient was given clear instructions to go to ER or return to medical center if symptoms don't improve, worsen or new problems develop. The patient verbalized understanding.   Follow-up: Return in about 3 months (around 12/11/2021) for televisit 2 weeks on a tuesday for BP check.   Gildardo Pounds, FNP-BC [facility] [place], [place]   09/12/2021, 11:37 AM

## 2021-09-10 NOTE — Progress Notes (Unsigned)
Patient states

## 2021-09-11 LAB — THYROID PANEL WITH TSH
Free Thyroxine Index: 2.6 (ref 1.2–4.9)
T3 Uptake Ratio: 25 % (ref 24–39)
T4, Total: 10.3 ug/dL (ref 4.5–12.0)
TSH: 1.57 u[IU]/mL (ref 0.450–4.500)

## 2021-09-11 LAB — CBC
Hematocrit: 36.9 % (ref 34.0–46.6)
Hemoglobin: 12.7 g/dL (ref 11.1–15.9)
MCH: 29.7 pg (ref 26.6–33.0)
MCHC: 34.4 g/dL (ref 31.5–35.7)
MCV: 86 fL (ref 79–97)
Platelets: 228 10*3/uL (ref 150–450)
RBC: 4.28 x10E6/uL (ref 3.77–5.28)
RDW: 12.3 % (ref 11.7–15.4)
WBC: 7.5 10*3/uL (ref 3.4–10.8)

## 2021-09-11 LAB — CMP14+EGFR
ALT: 19 IU/L (ref 0–32)
AST: 23 IU/L (ref 0–40)
Albumin/Globulin Ratio: 1.7 (ref 1.2–2.2)
Albumin: 4.6 g/dL (ref 3.9–4.9)
Alkaline Phosphatase: 123 IU/L — ABNORMAL HIGH (ref 44–121)
BUN/Creatinine Ratio: 17 (ref 12–28)
BUN: 12 mg/dL (ref 8–27)
Bilirubin Total: 0.3 mg/dL (ref 0.0–1.2)
CO2: 18 mmol/L — ABNORMAL LOW (ref 20–29)
Calcium: 9.8 mg/dL (ref 8.7–10.3)
Chloride: 99 mmol/L (ref 96–106)
Creatinine, Ser: 0.71 mg/dL (ref 0.57–1.00)
Globulin, Total: 2.7 g/dL (ref 1.5–4.5)
Glucose: 163 mg/dL — ABNORMAL HIGH (ref 70–99)
Potassium: 4.6 mmol/L (ref 3.5–5.2)
Sodium: 132 mmol/L — ABNORMAL LOW (ref 134–144)
Total Protein: 7.3 g/dL (ref 6.0–8.5)
eGFR: 95 mL/min/{1.73_m2} (ref >59)

## 2021-09-12 ENCOUNTER — Encounter: Payer: Self-pay | Admitting: Nurse Practitioner

## 2021-09-12 MED ORDER — AMLODIPINE BESYLATE 10 MG PO TABS
10.0000 mg | ORAL_TABLET | Freq: Every day | ORAL | 1 refills | Status: DC
  Filled 2021-09-12: qty 90, 90d supply, fill #0
  Filled 2021-12-16: qty 90, 90d supply, fill #1

## 2021-09-13 ENCOUNTER — Other Ambulatory Visit: Payer: Self-pay

## 2021-09-17 ENCOUNTER — Other Ambulatory Visit: Payer: Self-pay

## 2021-09-17 ENCOUNTER — Other Ambulatory Visit: Payer: Self-pay | Admitting: Nurse Practitioner

## 2021-09-17 DIAGNOSIS — E1165 Type 2 diabetes mellitus with hyperglycemia: Secondary | ICD-10-CM

## 2021-09-17 MED ORDER — TRUE METRIX BLOOD GLUCOSE TEST VI STRP
ORAL_STRIP | 3 refills | Status: DC
  Filled 2021-09-17: qty 100, 25d supply, fill #0
  Filled 2022-01-26: qty 100, 25d supply, fill #1
  Filled 2022-04-22: qty 100, 25d supply, fill #2
  Filled 2022-06-03 (×3): qty 100, 25d supply, fill #3

## 2021-09-17 MED ORDER — TRUEPLUS LANCETS 28G MISC
5 refills | Status: DC
  Filled 2021-09-17: qty 100, 25d supply, fill #0

## 2021-10-11 ENCOUNTER — Other Ambulatory Visit: Payer: Self-pay

## 2021-10-12 ENCOUNTER — Other Ambulatory Visit: Payer: Self-pay

## 2021-10-14 ENCOUNTER — Telehealth: Payer: Self-pay

## 2021-10-14 ENCOUNTER — Other Ambulatory Visit: Payer: Self-pay | Admitting: Nurse Practitioner

## 2021-10-14 ENCOUNTER — Other Ambulatory Visit: Payer: Self-pay | Admitting: Family Medicine

## 2021-10-14 ENCOUNTER — Other Ambulatory Visit: Payer: Self-pay

## 2021-10-14 ENCOUNTER — Ambulatory Visit: Payer: Self-pay | Attending: Nurse Practitioner | Admitting: Pharmacist

## 2021-10-14 ENCOUNTER — Encounter: Payer: Self-pay | Admitting: Pharmacist

## 2021-10-14 VITALS — BP 125/68 | HR 60

## 2021-10-14 DIAGNOSIS — I10 Essential (primary) hypertension: Secondary | ICD-10-CM

## 2021-10-14 DIAGNOSIS — E7800 Pure hypercholesterolemia, unspecified: Secondary | ICD-10-CM

## 2021-10-14 MED ORDER — ROSUVASTATIN CALCIUM 5 MG PO TABS
5.0000 mg | ORAL_TABLET | Freq: Every day | ORAL | 2 refills | Status: DC
  Filled 2021-10-14: qty 30, 30d supply, fill #0
  Filled 2022-02-08: qty 30, 30d supply, fill #1

## 2021-10-14 MED ORDER — LISINOPRIL 20 MG PO TABS
ORAL_TABLET | Freq: Every day | ORAL | 2 refills | Status: DC
  Filled 2021-10-14: qty 30, 30d supply, fill #0
  Filled 2021-11-19: qty 30, 30d supply, fill #1
  Filled 2021-12-13: qty 30, 30d supply, fill #2

## 2021-10-14 NOTE — Telephone Encounter (Signed)
Via Erin Huff, Patient informed due to delay in transport of stool specimen, specimen was not processed, if prefers, we can mail another kit to patient. Patient requested new FIT test kit to be mailed to her address. FIT test mailed.

## 2021-10-14 NOTE — Progress Notes (Signed)
   S:    No chief complaint on file.  Erin Huff is a 66 y.o. female who presents for hypertension evaluation, education, and management.  PMH is significant for HTN, T2DM, Aortic stenosis, thoracis aortic aneurysm.   Patient was referred and last seen by Primary Care Provider, [name], on 09/10/2021. At that visit, BP was 152/83, Amlodipine was increased from 5 mg daily to 10 mg daily.   Today, patient arrives in good spirits and presents without assistance. Denies dizziness, headache, blurred vision, swelling.   Family/Social history:  Fhx: diabetes, HTN Tobacco: never smoked  Medication adherence is appropriate. Patient has taken BP medications today.   Current antihypertensives include: Lisinopril 20 mg daily, Amlodipine 10 mg daily, Carvedilol 6.25 mg BID   Antihypertensives tried in the past include: Lisinopril/HCTZ 20-12.5 mg (Hyponatremia), Lisinopril 30 mg   Reported home BP readings: 110s-120s/70s  Patient reported dietary habits:  -Patient reports adherence with salt restriction.  -Caffeine: drinks one decaf coffee daily  Patient-reported exercise habits:  -Patient reports not attending Zumba classes anymore, but she goes for small walks.   O:  Vitals:   10/14/21 1518  BP: 125/68  Pulse: 60   Last 3 Office BP readings: BP Readings from Last 3 Encounters:  10/14/21 125/68  09/10/21 (!) 152/83  09/02/21 (!) 158/80   BMET    Component Value Date/Time   NA 132 (L) 09/10/2021 1613   K 4.6 09/10/2021 1613   CL 99 09/10/2021 1613   CO2 18 (L) 09/10/2021 1613   GLUCOSE 163 (H) 09/10/2021 1613   GLUCOSE 99 09/26/2016 1603   BUN 12 09/10/2021 1613   CREATININE 0.71 09/10/2021 1613   CREATININE 0.81 03/01/2016 0956   CALCIUM 9.8 09/10/2021 1613   GFRNONAA 95 01/21/2020 1455   GFRAA 109 01/21/2020 1455    Renal function: CrCl cannot be calculated (Patient's most recent lab result is older than the maximum 21 days allowed.).  Clinical  ASCVD: No  The 10-year ASCVD risk score (Arnett DK, et al., 2019) is: 9.2%   Values used to calculate the score:     Age: 44 years     Sex: Female     Is Non-Hispanic African American: No     Diabetic: Yes     Tobacco smoker: No     Systolic Blood Pressure: 125 mmHg     Is BP treated: Yes     HDL Cholesterol: 67 mg/dL     Total Cholesterol: 146 mg/dL  A/P: Hypertension longstanding currently controlled on current medications. BP goal < 130/80 mmHg. Medication adherence appears appropriate.  -Continued Amlodipine 10 mg once daily.  -Continued Carvedilol 6.25 mg twice daily. -Continued Lisinopril 20 mg once daily. -F/u labs ordered - none -Counseled on lifestyle modifications for blood pressure control including reduced dietary sodium, increased exercise, adequate sleep. -Encouraged patient to check BP at home and bring log of readings to next visit. Counseled on proper use of home BP cuff.    Results reviewed and written information provided.    Written patient instructions provided. Patient verbalized understanding of treatment plan.  Total time in face to face counseling 30 minutes.    Follow-up:  Follow up with PCP in October.  Patient seen with: Lajean Saver, PharmD Candidate [employer]    Pharmacist:  Butch Penny, PharmD, [place], CPP Clinical Pharmacist [facility] [phone]

## 2021-10-15 ENCOUNTER — Other Ambulatory Visit: Payer: Self-pay

## 2021-10-18 ENCOUNTER — Other Ambulatory Visit: Payer: Self-pay

## 2021-10-19 ENCOUNTER — Other Ambulatory Visit: Payer: Self-pay

## 2021-11-19 ENCOUNTER — Other Ambulatory Visit: Payer: Self-pay

## 2021-12-03 ENCOUNTER — Other Ambulatory Visit: Payer: Self-pay | Admitting: Nurse Practitioner

## 2021-12-03 ENCOUNTER — Other Ambulatory Visit: Payer: Self-pay

## 2021-12-03 ENCOUNTER — Other Ambulatory Visit: Payer: Self-pay | Admitting: Pharmacist

## 2021-12-03 DIAGNOSIS — E039 Hypothyroidism, unspecified: Secondary | ICD-10-CM

## 2021-12-03 DIAGNOSIS — E1165 Type 2 diabetes mellitus with hyperglycemia: Secondary | ICD-10-CM

## 2021-12-03 MED ORDER — METFORMIN HCL 1000 MG PO TABS
ORAL_TABLET | Freq: Two times a day (BID) | ORAL | 2 refills | Status: DC
  Filled 2021-12-03: qty 180, 90d supply, fill #0

## 2021-12-03 MED ORDER — LEVOTHYROXINE SODIUM 100 MCG PO TABS
ORAL_TABLET | ORAL | 2 refills | Status: DC
  Filled 2021-12-03: qty 90, 90d supply, fill #0

## 2021-12-06 ENCOUNTER — Telehealth: Payer: Self-pay | Admitting: *Deleted

## 2021-12-06 NOTE — Telephone Encounter (Signed)
Patient and daughter Erin Huff walked into the office. Mariela translated for patient.  Patient stating that her heart rate is fast once she takes her evening dose of carvedilol at night time,keeping her up at night.  RN informed patient this medication is usually does not usually keep patient up at night with fast heart rate.  Patient states when she takes heart rate the numbers are in the 60's to70's.  RN asked patient what time of the night  will patient take carvedilol . Answered translated around 8 pm . She takes her morning dose around 9 am.  RN informed patient to take carvedilol around 6 pm at night.  If she likes can take her other medication at the same time or wait and take it around 8 pm.  Keep  appointment for 12/31/21. Bring  blood pressure and pulse rate to office appointment.  Daughter and patient verbalized understanding.

## 2021-12-13 ENCOUNTER — Other Ambulatory Visit: Payer: Self-pay

## 2021-12-14 ENCOUNTER — Other Ambulatory Visit: Payer: Self-pay

## 2021-12-14 ENCOUNTER — Ambulatory Visit: Payer: No Typology Code available for payment source | Attending: Nurse Practitioner | Admitting: Nurse Practitioner

## 2021-12-14 DIAGNOSIS — E1165 Type 2 diabetes mellitus with hyperglycemia: Secondary | ICD-10-CM

## 2021-12-14 DIAGNOSIS — I10 Essential (primary) hypertension: Secondary | ICD-10-CM

## 2021-12-14 DIAGNOSIS — E785 Hyperlipidemia, unspecified: Secondary | ICD-10-CM

## 2021-12-14 NOTE — Progress Notes (Signed)
Patient has office visit on 10-26 with pharmacist for HTN. Will also obtain labs and she has been informed of this today with assistance from [place] interpreter

## 2021-12-15 NOTE — Progress Notes (Deleted)
   S:     PCP: [name]  66 y.o. female who presents for hypertension evaluation, education, and management.  PMH is significant for HTN, T2DM, HLD, obesity, and aortic stenosis.  Patient was referred and last seen by Primary Care Provider, NP [name], on 09/10/21. At that time BP was elevated and amlodipine dose was increased.  At last visit with the pharmacy team, BP was controlled so current medications were continued.  Patient reached out to the cardiologist on 10/16  reporting insomnia d/t HR elevations from her evening dose of carvedilol.   Today, patient arrives in *** spirits and presents without *** assistance. *** Denies dizziness, headache, blurred vision, swelling.   Patient reports hypertension was diagnosed in ***.   Family/Social history:  Fhx: diabetes, HTN Tobacco: never smoked  Medication adherence *** . Patient has *** taken BP medications today.   Current antihypertensives include: Lisinopril 20 mg daily, Amlodipine 10 mg daily, Carvedilol 6.25 mg BID    Antihypertensives tried in the past include: Lisinopril/HCTZ 20-12.5 mg (Hyponatremia), Lisinopril 30 mg   Patient reported dietary habits:  -Patient reports adherence with salt restriction.  -Caffeine: drinks one decaf coffee daily   Patient-reported exercise habits:  -Patient reports not attending Zumba classes anymore, but she goes for small walks.   O:    Last 3 Office BP readings: BP Readings from Last 3 Encounters:  10/14/21 125/68  09/10/21 (!) 152/83  09/02/21 (!) 158/80    BMET    Component Value Date/Time   NA 132 (L) 09/10/2021 1613   K 4.6 09/10/2021 1613   CL 99 09/10/2021 1613   CO2 18 (L) 09/10/2021 1613   GLUCOSE 163 (H) 09/10/2021 1613   GLUCOSE 99 09/26/2016 1603   BUN 12 09/10/2021 1613   CREATININE 0.71 09/10/2021 1613   CREATININE 0.81 03/01/2016 0956   CALCIUM 9.8 09/10/2021 1613   GFRNONAA 95 01/21/2020 1455   GFRAA 109 01/21/2020 1455    Renal  function: CrCl cannot be calculated (Patient's most recent lab result is older than the maximum 21 days allowed.).  Clinical ASCVD: No  The 10-year ASCVD risk score (Arnett DK, et al., 2019) is: 9.2%   Values used to calculate the score:     Age: 78 years     Sex: Female     Is Non-Hispanic African American: No     Diabetic: Yes     Tobacco smoker: No     Systolic Blood Pressure: 211 mmHg     Is BP treated: Yes     HDL Cholesterol: 67 mg/dL     Total Cholesterol: 146 mg/dL    A/P: Hypertension diagnosed *** currently *** on current medications. BP goal < 130/80 *** mmHg. Medication adherence appears ***. Control is suboptimal due to ***.  -{Meds adjust:18428} ***.  -Patient educated on purpose, proper use, and potential adverse effects of ***.  -F/u labs ordered - *** -Counseled on lifestyle modifications for blood pressure control including reduced dietary sodium, increased exercise, adequate sleep. -Encouraged patient to check BP at home and bring log of readings to next visit. Counseled on proper use of home BP cuff.    Results reviewed and written information provided.    Written patient instructions provided. Patient verbalized understanding of treatment plan.  Total time in face to face counseling *** minutes.    Follow-up:  Pharmacist ***. PCP clinic visit in December  Maryan Puls, PharmD PGY-1 [facility] Pharm[facility] Resident

## 2021-12-16 ENCOUNTER — Other Ambulatory Visit: Payer: Self-pay

## 2021-12-16 ENCOUNTER — Ambulatory Visit: Payer: Self-pay | Attending: Nurse Practitioner

## 2021-12-16 ENCOUNTER — Ambulatory Visit: Payer: No Typology Code available for payment source | Admitting: Pharmacist

## 2021-12-16 DIAGNOSIS — E1165 Type 2 diabetes mellitus with hyperglycemia: Secondary | ICD-10-CM

## 2021-12-16 DIAGNOSIS — E785 Hyperlipidemia, unspecified: Secondary | ICD-10-CM

## 2021-12-16 DIAGNOSIS — I10 Essential (primary) hypertension: Secondary | ICD-10-CM

## 2021-12-17 LAB — CMP14+EGFR
ALT: 23 IU/L (ref 0–32)
AST: 22 IU/L (ref 0–40)
Albumin/Globulin Ratio: 1.7 (ref 1.2–2.2)
Albumin: 4.4 g/dL (ref 3.9–4.9)
Alkaline Phosphatase: 104 IU/L (ref 44–121)
BUN/Creatinine Ratio: 12 (ref 12–28)
BUN: 9 mg/dL (ref 8–27)
Bilirubin Total: 0.3 mg/dL (ref 0.0–1.2)
CO2: 19 mmol/L — ABNORMAL LOW (ref 20–29)
Calcium: 8.9 mg/dL (ref 8.7–10.3)
Chloride: 94 mmol/L — ABNORMAL LOW (ref 96–106)
Creatinine, Ser: 0.78 mg/dL (ref 0.57–1.00)
Globulin, Total: 2.6 g/dL (ref 1.5–4.5)
Glucose: 236 mg/dL — ABNORMAL HIGH (ref 70–99)
Potassium: 4.9 mmol/L (ref 3.5–5.2)
Sodium: 129 mmol/L — ABNORMAL LOW (ref 134–144)
Total Protein: 7 g/dL (ref 6.0–8.5)
eGFR: 85 mL/min/{1.73_m2} (ref >59)

## 2021-12-17 LAB — LIPID PANEL
Chol/HDL Ratio: 2.2 ratio (ref 0.0–4.4)
Cholesterol, Total: 141 mg/dL (ref 100–199)
HDL: 63 mg/dL (ref >39)
LDL Chol Calc (NIH): 52 mg/dL (ref 0–99)
Triglycerides: 158 mg/dL — ABNORMAL HIGH (ref 0–149)
VLDL Cholesterol Cal: 26 mg/dL (ref 5–40)

## 2021-12-17 LAB — HEMOGLOBIN A1C
Est. average glucose Bld gHb Est-mCnc: 160 mg/dL
Hgb A1c MFr Bld: 7.2 % — ABNORMAL HIGH (ref 4.8–5.6)

## 2021-12-21 ENCOUNTER — Other Ambulatory Visit: Payer: Self-pay

## 2021-12-23 ENCOUNTER — Other Ambulatory Visit: Payer: Self-pay

## 2021-12-31 ENCOUNTER — Ambulatory Visit: Payer: Self-pay | Admitting: Cardiology

## 2022-01-07 ENCOUNTER — Telehealth: Payer: Self-pay | Admitting: Emergency Medicine

## 2022-01-07 NOTE — Telephone Encounter (Signed)
Copied from CRM [id number]. Topic: General - Inquiry >> Jan 07, 2022  4:39 PM Runell Gess P wrote: Reason for CRM: pt called saying she has not heard back from the office about the labs she had done a couple of weeks ago.  CB  [phone]

## 2022-01-10 NOTE — Telephone Encounter (Signed)
Voicemail left via language line interpreter [place] id# [phone], to return call for results.

## 2022-01-11 ENCOUNTER — Other Ambulatory Visit: Payer: Self-pay | Admitting: Family Medicine

## 2022-01-11 ENCOUNTER — Other Ambulatory Visit: Payer: Self-pay

## 2022-01-11 ENCOUNTER — Telehealth: Payer: Self-pay

## 2022-01-11 DIAGNOSIS — I10 Essential (primary) hypertension: Secondary | ICD-10-CM

## 2022-01-11 MED ORDER — LISINOPRIL 20 MG PO TABS
20.0000 mg | ORAL_TABLET | Freq: Every day | ORAL | 0 refills | Status: DC
  Filled 2022-01-11: qty 30, 30d supply, fill #0

## 2022-01-11 NOTE — Telephone Encounter (Signed)
Pt given results per notes of [name], NP on 01/11/22. Pt verbalized understanding. Interpreter ID Harrison Mons #[id number]. Scheduled lab appt tomorrow at 1030.

## 2022-01-11 NOTE — Telephone Encounter (Signed)
Noted  

## 2022-01-12 ENCOUNTER — Other Ambulatory Visit: Payer: Self-pay

## 2022-01-12 ENCOUNTER — Other Ambulatory Visit: Payer: Self-pay | Admitting: Nurse Practitioner

## 2022-01-12 ENCOUNTER — Ambulatory Visit: Payer: Self-pay | Attending: Nurse Practitioner

## 2022-01-12 DIAGNOSIS — E871 Hypo-osmolality and hyponatremia: Secondary | ICD-10-CM

## 2022-01-13 LAB — BASIC METABOLIC PANEL
BUN/Creatinine Ratio: 17 (ref 12–28)
BUN: 13 mg/dL (ref 8–27)
CO2: 22 mmol/L (ref 20–29)
Calcium: 9.3 mg/dL (ref 8.7–10.3)
Chloride: 98 mmol/L (ref 96–106)
Creatinine, Ser: 0.77 mg/dL (ref 0.57–1.00)
Glucose: 287 mg/dL — ABNORMAL HIGH (ref 70–99)
Potassium: 5 mmol/L (ref 3.5–5.2)
Sodium: 133 mmol/L — ABNORMAL LOW (ref 134–144)
eGFR: 86 mL/min/{1.73_m2} (ref >59)

## 2022-01-14 ENCOUNTER — Telehealth: Payer: Self-pay

## 2022-01-14 NOTE — Telephone Encounter (Signed)
Pt called for lab results - Results not released for [facility] to disclose.

## 2022-01-19 ENCOUNTER — Ambulatory Visit: Payer: Self-pay | Attending: Cardiology | Admitting: Cardiology

## 2022-01-19 ENCOUNTER — Encounter: Payer: Self-pay | Admitting: Cardiology

## 2022-01-19 VITALS — BP 132/80 | HR 70 | Ht 60.0 in | Wt 178.6 lb

## 2022-01-19 DIAGNOSIS — Z6834 Body mass index (BMI) 34.0-34.9, adult: Secondary | ICD-10-CM

## 2022-01-19 DIAGNOSIS — E785 Hyperlipidemia, unspecified: Secondary | ICD-10-CM

## 2022-01-19 DIAGNOSIS — I77810 Thoracic aortic ectasia: Secondary | ICD-10-CM

## 2022-01-19 DIAGNOSIS — I10 Essential (primary) hypertension: Secondary | ICD-10-CM

## 2022-01-19 DIAGNOSIS — E6609 Other obesity due to excess calories: Secondary | ICD-10-CM

## 2022-01-19 DIAGNOSIS — I350 Nonrheumatic aortic (valve) stenosis: Secondary | ICD-10-CM

## 2022-01-19 DIAGNOSIS — E1169 Type 2 diabetes mellitus with other specified complication: Secondary | ICD-10-CM

## 2022-01-19 NOTE — Patient Instructions (Addendum)
Medication Instructions:   No changes *If you need a refill on your cardiac medications before your next appointment, please call your pharmacy*   Lab Work: Not needed    Testing/Procedures: [address] in Late Jan?Feb 2024  Your physician has requested that you have an echocardiogram. Echocardiography is a painless test that uses sound waves to create images of your heart. It provides your doctor with information about the size and shape of your heart and how well your heart's chambers and valves are working. This procedure takes approximately one hour. There are no restrictions for this procedure. Please do NOT wear cologne, perfume, aftershave, or lotions (deodorant is allowed). Please arrive 15 minutes prior to your appointment time.    Follow-Up: At [facility], you and your health needs are our priority.  As part of our continuing mission to provide you with exceptional heart care, we have created designated Provider Care Teams.  These Care Teams include your primary Cardiologist (physician) and Advanced Practice Providers (APPs -  Physician Assistants and Nurse Practitioners) who all work together to provide you with the care you need, when you need it.     Your next appointment:    3 to 4 month(s) Feb or March 2024   The format for your next appointment:   In Person  Provider:   Bryan Lemma, MD

## 2022-01-19 NOTE — Progress Notes (Signed)
Primary Care Provider: Claiborne Rigg, NP [facility] Cardiologist: Bryan Lemma, MD Electrophysiologist: None  Clinic Note: Chief Complaint  Patient presents with   Follow-up    No major complaints   Aortic Stenosis    Doing well.  No symptoms.   ===================================  ASSESSMENT/PLAN   Problem List Items Addressed This Visit       Cardiology Problems   Moderate to severe aortic stenosis - Primary (Chronic)    Mean gradient of 47 mm rest recent echocardiogram.  Remains asymptomatic.  Plan will be follow-up echo in February 2024.  Thankfully, the CTA did not correlate with a 46 mm aortic dilation.  Is more like 4.2.  This is reassuring and she was very happy with that finding.  We went through the symptoms of aortic stenosis that to be concerned.  We also discussed the pathophysiology.  This was a long and detailed visit using the interpreter to explain step-by-step the pathophysiology.  She voiced much better understanding today than she did last time.  I think the first time she was just blown away by the details. Thankfully, she is completely asymptomatic.  BP is pretty well-controlled as are her lipids.      Relevant Orders   ECHOCARDIOGRAM COMPLETE   Thoracic aortic ectasia (HCC) (Chronic)    Echo in send 46 mm, however CT suggested 4.2.  We can follow-up annually to ensure that does not get larger.  Continue blood pressure control. Continue lipid and glycemic management.      Relevant Orders   ECHOCARDIOGRAM COMPLETE   Hyperlipidemia associated with type 2 diabetes mellitus (HCC) (Chronic)    Last lipids had an A1c of 7.2 LDL of 52 and triglycerides 158.  Relatively well-controlled.  Would like to see her A1c lower.  She is on Lovaza and 5 mg rosuvastatin with well-controlled lipids.  She is on glipizide and metformin for diabetes which could potentially be improved.  Could consider GLP-1 agonist or SGLT2 inhibitor for  cardioprotection as well.      Essential hypertension (Chronic)    Borderline BP today but has been stable.  She is on a pretty good regimen with amlodipine 10 mg daily, carvedilol 6.25 mg twice daily and lisinopril 40 mg daily.  She is not on a diuretic which would be the next potential step other than titrating up carvedilol.  If she has been started on SGLT2 inhibitor, probably would not use a diuretic.        Other   OBESITY (Chronic)    Stressed importance of diet and exercise try to lose weight.  This would lead stress on her valve.  Will also help her glycemic and lipid control as well as BP.  Consider SGLT2 inhibitor. She is very active at work.  Just needs to work on dietary changes.      ===================================  HPI:    Erin Huff is a 66 y.o. female with a PMH notable for Uncontrolled DM-2, HTN, HLD, Mod-Severe AS, TAA who presents today for ~6 month f/u. She follows-up at the request of Claiborne Rigg, NP. - Initial consult was for Tachycardia & Murmur (03/12/21)  Contracted Spanish Interpreter used for compete Interview.   I saw Erin Huff for f/u #1 on 06/14/21: Doing well - no CP, DOE, Orthopnea, PND or edema. No SSx of arhythmia / tachycardia. Recovering from Cold.  Echo Reviewed: Mod-Severe AS (AoV MG 27 mmHg. Mod Ascending TA dilation - ~ 46 mm => Rec CTA Chest-Aorta.  She was seen by Bernadene Person, NP on June 15 for follow-up.  She noted some mild fatigue only if walking long distances.  She did not go through with a CT scan because she was concerned about the risks.  She did not understand the scheduling process.->  CT rescheduled.  Otherwise stable.   Recent Hospitalizations: None  Reviewed  CV studies:    The following studies were reviewed today: (if available, images/films reviewed: From Epic Chart or Care Everywhere) CTA Chest- Aorta: 4.2 x 4.2 cm ascending aortic aneurysm. Recommend annual imaging followup by CTA  or MRA  Interval History:   Erin Huff returns today for follow-up doing well.  She really does not have demonstrated any symptoms aside some mild tightness in her chest with coughing.  Has not had any other symptoms to speak of.  No PND, orthopnea or edema.  No resting exertional chest tightness/pressure or dyspnea.  No irregular heartbeats or palpitation.  No syncope or near syncope.  No TIA or amaurosis fugax.  No claudication.  She is not all that active besides what she does at work.  Otherwise she seems to be doing well and feeling fairly well.  REVIEWED OF SYSTEMS   Review of Systems  Constitutional:  Negative for weight loss.  HENT:  Negative for congestion and nosebleeds.   Respiratory:  Positive for cough.   Gastrointestinal:  Negative for blood in stool and melena.  Genitourinary:  Negative for hematuria.  Musculoskeletal:  Negative for falls.  Neurological:  Negative for dizziness and headaches.  Psychiatric/Behavioral: Negative.    All other systems reviewed and are negative.  I have reviewed and (if needed) personally updated the patient's problem list, medications, allergies, past medical and surgical history, social and family history.   PAST MEDICAL HISTORY   Past Medical History:  Diagnosis Date   Allergy    Depression    mild   Diabetes mellitus without complication (HCC)    GERD (gastroesophageal reflux disease)    Hyperlipidemia    Hypertension    Moderate to severe aortic stenosis 03/2011   Moderate to severe AS noted on echo with mean gradient 27 mmHg.  Also noted moderate dilation of the ascending aorta-46 mm.   Thoracic aortic aneurysm (HCC) 04/06/2021   Measuring 46 mm on echo   Thyroid disease     PAST SURGICAL HISTORY   Past Surgical History:  Procedure Laterality Date   NO PAST SURGERIES     TRANSTHORACIC ECHOCARDIOGRAM  04/05/2021   EF 60 to 65%.  GR 1 DD.  Normal RV size and mildly elevated RVSP.  Normal RAP.  Moderate to  Severe Aortic Stenosis.  Mean gradient 27 mmHg.  Moderate dilation of the ascending aorta measuring 46 mm.  Recommend CTA    Immunization History  Administered Date(s) Administered   Influenza Whole 12/24/2007, 12/25/2008, 12/08/2009   Influenza,inj,Quad PF,6+ Mos 01/15/2015, 03/01/2016, 12/02/2016, 11/16/2018, 10/30/2020   Pneumococcal Conjugate-13 08/26/2016   Pneumococcal Polysaccharide-23 10/26/2007   Td 02/21/2001   Tdap 11/07/2013    MEDICATIONS/ALLERGIES   Current Meds  Medication Sig   amLODipine (NORVASC) 10 MG tablet Take 1 tablet (10 mg total) by mouth daily.   carvedilol (COREG) 6.25 MG tablet Take 1 tablet (6.25 mg total) by mouth 2 (two) times daily.   levothyroxine (SYNTHROID) 100 MCG tablet TAKE 1 TABLET (100 MCG TOTAL) BY MOUTH DAILY BEFORE BREAKFAST.   lisinopril (ZESTRIL) 20 MG tablet Take 1 tablet (20 mg total) by mouth daily.  metFORMIN (GLUCOPHAGE) 1000 MG tablet TAKE 1 TABLET (1,000 MG TOTAL) BY MOUTH 2 (TWO) TIMES DAILY WITH A MEAL.   omeprazole (PRILOSEC) 20 MG capsule Take 1 capsule (20 mg total) by mouth daily as needed. PARA ACIDO   rosuvastatin (CRESTOR) 5 MG tablet Take 1 tablet (5 mg total) by mouth daily.   Current Facility-Administered Medications for the 01/19/22 encounter (Office Visit) with Marykay LexHarding, Lynell Kussman W, MD  Medication   0.9 %  sodium chloride infusion    Allergies  Allergen Reactions   Motrin [Ibuprofen] Rash   Penicillins Rash    SOCIAL HISTORY/FAMILY HISTORY   Reviewed in Epic:  Pertinent findings:  Social History   Tobacco Use   Smoking status: Never   Smokeless tobacco: Never  Vaping Use   Vaping Use: Never used  Substance Use Topics   Alcohol use: No    Comment: rare    Drug use: No   Social History   Social History Narrative   Sometimes husband is aggressive but she talks back and is aggressive with him. Does not take it.       Native of [place], but has been living in [place] area for 18+ years.  Speaks  Spanish only.      Usually active, enjoys doing Zumba exercise    OBJCTIVE -PE, EKG, labs   Wt Readings from Last 3 Encounters:  01/19/22 178 lb 9.6 oz (81 kg)  09/10/21 172 lb 3.2 oz (78.1 kg)  09/02/21 167 lb 11.2 oz (76.1 kg)    Physical Exam: BP 132/80   Pulse 70   Ht 5' (1.524 m)   Wt 178 lb 9.6 oz (81 kg)   SpO2 96%   BMI 34.88 kg/m  Physical Exam Vitals reviewed.  Constitutional:      General: She is not in acute distress.    Appearance: Normal appearance. She is obese. She is not ill-appearing (Healthy-appearing.  Well-nourished, well-groomed) or toxic-appearing.  HENT:     Head: Normocephalic.  Neck:     Vascular: Normal carotid pulses (Normal carotid upstroke). No carotid bruit (Radiated AOV murmur) or JVD.  Cardiovascular:     Rate and Rhythm: Normal rate and regular rhythm. No extrasystoles are present.    Chest Wall: PMI is not displaced.     Pulses: Normal pulses.     Heart sounds: S1 normal and S2 normal. Heart sounds are distant. Murmur heard.     Harsh crescendo-decrescendo mid to late systolic murmur is present with a grade of 2/6 at the upper right sternal border radiating to the neck.     No friction rub. No gallop.  Pulmonary:     Effort: Pulmonary effort is normal. No respiratory distress.     Breath sounds: Normal breath sounds. No wheezing, rhonchi or rales.  Chest:     Chest wall: No tenderness.  Musculoskeletal:        General: No swelling. Normal range of motion.     Cervical back: Normal range of motion and neck supple.  Skin:    General: Skin is warm and dry.     Coloration: Skin is not jaundiced.  Neurological:     General: No focal deficit present.     Mental Status: She is alert and oriented to person, place, and time. Mental status is at baseline.     Gait: Gait normal.  Psychiatric:        Mood and Affect: Mood normal.        Behavior: Behavior normal.  Thought Content: Thought content normal.        Judgment: Judgment  normal.     Adult ECG Report N/a  Recent Labs:  reviewed.   Lab Results  Component Value Date   CHOL 141 12/16/2021   HDL 63 12/16/2021   LDLCALC 52 12/16/2021   TRIG 158 (H) 12/16/2021   CHOLHDL 2.2 12/16/2021   Lab Results  Component Value Date   CREATININE 0.77 01/12/2022   BUN 13 01/12/2022   NA 133 (L) 01/12/2022   K 5.0 01/12/2022   CL 98 01/12/2022   CO2 22 01/12/2022      Latest Ref Rng & Units 09/10/2021    4:13 PM 07/30/2020    4:42 PM 04/17/2019    9:06 AM  CBC  WBC 3.4 - 10.8 x10E3/uL 7.5  8.7  8.2   Hemoglobin 11.1 - 15.9 g/dL 82.5  00.3  70.4   Hematocrit 34.0 - 46.6 % 36.9  37.1  38.5   Platelets 150 - 450 x10E3/uL 228  231  238     Lab Results  Component Value Date   HGBA1C 7.2 (H) 12/16/2021   Lab Results  Component Value Date   TSH 1.570 09/10/2021    ================================================== I spent a total of 46 min with the patient spent in direct patient consultation.  Additional time spent with chart review  / charting (studies, outside notes, etc): 16 min Total Time: 62 min  Current medicines are reviewed at length with the patient today.  (+/- concerns) n/a  Notice: This dictation was prepared with Dragon dictation along with smart phrase technology. Any transcriptional errors that result from this process are unintentional and may not be corrected upon review.  Studies Ordered:   Orders Placed This Encounter  Procedures   ECHOCARDIOGRAM COMPLETE   No orders of the defined types were placed in this encounter.   Patient Instructions / Medication Changes & Studies & Tests Ordered   Patient Instructions  Medication Instructions:   No changes *If you need a refill on your cardiac medications before your next appointment, please call your pharmacy*   Lab Work: Not needed    Testing/Procedures: [address] in Late Jan?Feb 2024  Your physician has requested that you have an echocardiogram.  Echocardiography is a painless test that uses sound waves to create images of your heart. It provides your doctor with information about the size and shape of your heart and how well your heart's chambers and valves are working. This procedure takes approximately one hour. There are no restrictions for this procedure. Please do NOT wear cologne, perfume, aftershave, or lotions (deodorant is allowed). Please arrive 15 minutes prior to your appointment time.    Follow-Up: At [facility], you and your health needs are our priority.  As part of our continuing mission to provide you with exceptional heart care, we have created designated Provider Care Teams.  These Care Teams include your primary Cardiologist (physician) and Advanced Practice Providers (APPs -  Physician Assistants and Nurse Practitioners) who all work together to provide you with the care you need, when you need it.     Your next appointment:    3 to 4 month(s) Feb or March 2024   The format for your next appointment:   In Person  Provider:   Bryan Lemma, MD       Marykay Lex, MD, MS Bryan Lemma, M.D., M.S. Interventional Chartered certified accountant  Pager # [phone]  Phone # [phone] [address] [place], [place] [address]   Thank you for choosing [facility]!!

## 2022-01-24 ENCOUNTER — Ambulatory Visit: Payer: Self-pay | Attending: Nurse Practitioner | Admitting: Pharmacist

## 2022-01-24 ENCOUNTER — Encounter: Payer: Self-pay | Admitting: Pharmacist

## 2022-01-24 ENCOUNTER — Other Ambulatory Visit: Payer: Self-pay

## 2022-01-24 VITALS — BP 122/71 | HR 64

## 2022-01-24 DIAGNOSIS — I10 Essential (primary) hypertension: Secondary | ICD-10-CM

## 2022-01-24 MED ORDER — VITAMIN D (ERGOCALCIFEROL) 1.25 MG (50000 UNIT) PO CAPS
50000.0000 [IU] | ORAL_CAPSULE | ORAL | 0 refills | Status: DC
  Filled 2022-01-24: qty 12, 84d supply, fill #0

## 2022-01-24 NOTE — Progress Notes (Signed)
   S:    Chief Complaint  Patient presents with   Medication Management    Hypertension   66 y.o. female who presents for hypertension evaluation, education, and management.  PMH is significant for HTN, T2DM, Aortic stenosis, thoracis aortic aneurysm.  Patient was referred and last seen by Primary Care Provider, [name], on 09/10/2021. Last seen by pharmacy clinic on 10/14/2021. At last visit, BP was at goal 125/68 mmHg.   Today, patient arrives in good spirits and presents with her granddaughter and with the assistance of an online interpretor. Denies dizziness, headache, blurred vision, swelling.   Medication adherence reported. Patient has taken BP medications today.   Current antihypertensives include: amlodipine 10 mg daily, carvedilol 6.25 mg BID, lisinopril 20 mg daily   Reported home BP readings: 120-130/70s O:   Last 3 Office BP readings: BP Readings from Last 3 Encounters:  01/24/22 122/71  01/19/22 132/80  10/14/21 125/68    BMET    Component Value Date/Time   NA 133 (L) 01/12/2022 1100   K 5.0 01/12/2022 1100   CL 98 01/12/2022 1100   CO2 22 01/12/2022 1100   GLUCOSE 287 (H) 01/12/2022 1100   GLUCOSE 99 09/26/2016 1603   BUN 13 01/12/2022 1100   CREATININE 0.77 01/12/2022 1100   CREATININE 0.81 03/01/2016 0956   CALCIUM 9.3 01/12/2022 1100   GFRNONAA 95 01/21/2020 1455   GFRAA 109 01/21/2020 1455    Renal function: Estimated Creatinine Clearance: 67 mL/min (by C-G formula based on SCr of 0.77 mg/dL).  Clinical ASCVD: No  The 10-year ASCVD risk score (Arnett DK, et al., 2019) is: 8.9%   Values used to calculate the score:     Age: 84 years     Sex: Female     Is Non-Hispanic African American: No     Diabetic: Yes     Tobacco smoker: No     Systolic Blood Pressure: 122 mmHg     Is BP treated: Yes     HDL Cholesterol: 63 mg/dL     Total Cholesterol: 141 mg/dL    A/P: Hypertension longstanding, currently at goal on current medications based  on clinic and home BP readings. BP goal < 130/80 mmHg. Medication adherence appears appropriate. -Continued current doses of amlodipine, carvedilol, and lisinopril.  -Counseled on lifestyle modifications for blood pressure control including reduced dietary sodium, increased exercise, adequate sleep. -Encouraged patient to check BP at home and bring log of readings to next visit. Counseled on proper use of home BP cuff.   Results reviewed and written information provided.    Written patient instructions provided. Patient verbalized understanding of treatment plan.  Total time in face to face counseling 15 minutes.    Follow-up:  Pharmacist PRN. PCP clinic visit on 02/16/2022.  Valeda Malm, Pharm.D. PGY-2 Ambulatory Care Pharmacy Resident 01/24/2022 3:37 PM

## 2022-01-26 ENCOUNTER — Other Ambulatory Visit: Payer: Self-pay

## 2022-02-06 ENCOUNTER — Encounter: Payer: Self-pay | Admitting: Cardiology

## 2022-02-06 NOTE — Assessment & Plan Note (Signed)
Last lipids had an A1c of 7.2 LDL of 52 and triglycerides 158.  Relatively well-controlled.  Would like to see her A1c lower.  She is on Lovaza and 5 mg rosuvastatin with well-controlled lipids.  She is on glipizide and metformin for diabetes which could potentially be improved.  Could consider GLP-1 agonist or SGLT2 inhibitor for cardioprotection as well.

## 2022-02-06 NOTE — Assessment & Plan Note (Signed)
Stressed importance of diet and exercise try to lose weight.  This would lead stress on her valve.  Will also help her glycemic and lipid control as well as BP.  Consider SGLT2 inhibitor. She is very active at work.  Just needs to work on dietary changes.

## 2022-02-06 NOTE — Assessment & Plan Note (Signed)
Mean gradient of 47 mm rest recent echocardiogram.  Remains asymptomatic.  Plan will be follow-up echo in February 2024.  Thankfully, the CTA did not correlate with a 46 mm aortic dilation.  Is more like 4.2.  This is reassuring and she was very happy with that finding.  We went through the symptoms of aortic stenosis that to be concerned.  We also discussed the pathophysiology.  This was a long and detailed visit using the interpreter to explain step-by-step the pathophysiology.  She voiced much better understanding today than she did last time.  I think the first time she was just blown away by the details. Thankfully, she is completely asymptomatic.  BP is pretty well-controlled as are her lipids.

## 2022-02-06 NOTE — Assessment & Plan Note (Signed)
Borderline BP today but has been stable.  She is on a pretty good regimen with amlodipine 10 mg daily, carvedilol 6.25 mg twice daily and lisinopril 40 mg daily.  She is not on a diuretic which would be the next potential step other than titrating up carvedilol.  If she has been started on SGLT2 inhibitor, probably would not use a diuretic.

## 2022-02-06 NOTE — Assessment & Plan Note (Signed)
Echo in send 46 mm, however CT suggested 4.2.  We can follow-up annually to ensure that does not get larger.  Continue blood pressure control. Continue lipid and glycemic management.

## 2022-02-08 ENCOUNTER — Other Ambulatory Visit: Payer: Self-pay | Admitting: Family Medicine

## 2022-02-08 ENCOUNTER — Other Ambulatory Visit: Payer: Self-pay

## 2022-02-08 ENCOUNTER — Other Ambulatory Visit: Payer: Self-pay | Admitting: Nurse Practitioner

## 2022-02-08 DIAGNOSIS — E039 Hypothyroidism, unspecified: Secondary | ICD-10-CM

## 2022-02-08 DIAGNOSIS — I10 Essential (primary) hypertension: Secondary | ICD-10-CM

## 2022-02-08 MED ORDER — LEVOTHYROXINE SODIUM 100 MCG PO TABS
100.0000 ug | ORAL_TABLET | Freq: Every day | ORAL | 2 refills | Status: DC
  Filled 2022-02-08: qty 30, 30d supply, fill #0
  Filled 2022-04-01: qty 30, 30d supply, fill #1

## 2022-02-08 MED ORDER — LISINOPRIL 20 MG PO TABS
20.0000 mg | ORAL_TABLET | Freq: Every day | ORAL | 2 refills | Status: DC
  Filled 2022-02-08: qty 30, 30d supply, fill #0
  Filled 2022-03-11: qty 30, 30d supply, fill #1

## 2022-02-09 ENCOUNTER — Other Ambulatory Visit: Payer: Self-pay

## 2022-02-16 ENCOUNTER — Ambulatory Visit: Payer: Self-pay | Attending: Nurse Practitioner | Admitting: Nurse Practitioner

## 2022-02-16 ENCOUNTER — Other Ambulatory Visit: Payer: Self-pay

## 2022-02-16 ENCOUNTER — Encounter: Payer: Self-pay | Admitting: Nurse Practitioner

## 2022-02-16 VITALS — BP 134/78 | HR 70 | Ht 60.0 in | Wt 178.8 lb

## 2022-02-16 DIAGNOSIS — E781 Pure hyperglyceridemia: Secondary | ICD-10-CM

## 2022-02-16 DIAGNOSIS — I10 Essential (primary) hypertension: Secondary | ICD-10-CM

## 2022-02-16 DIAGNOSIS — K219 Gastro-esophageal reflux disease without esophagitis: Secondary | ICD-10-CM

## 2022-02-16 DIAGNOSIS — B89 Unspecified parasitic disease: Secondary | ICD-10-CM

## 2022-02-16 DIAGNOSIS — E1165 Type 2 diabetes mellitus with hyperglycemia: Secondary | ICD-10-CM

## 2022-02-16 DIAGNOSIS — Z23 Encounter for immunization: Secondary | ICD-10-CM

## 2022-02-16 DIAGNOSIS — L209 Atopic dermatitis, unspecified: Secondary | ICD-10-CM

## 2022-02-16 DIAGNOSIS — E7800 Pure hypercholesterolemia, unspecified: Secondary | ICD-10-CM

## 2022-02-16 MED ORDER — GLIPIZIDE ER 10 MG PO TB24
20.0000 mg | ORAL_TABLET | Freq: Every day | ORAL | 1 refills | Status: DC
  Filled 2022-02-16: qty 60, 30d supply, fill #0
  Filled 2022-02-24: qty 180, 90d supply, fill #0

## 2022-02-16 MED ORDER — AMLODIPINE BESYLATE 10 MG PO TABS
10.0000 mg | ORAL_TABLET | Freq: Every day | ORAL | 1 refills | Status: DC
  Filled 2022-02-16 – 2022-02-28 (×2): qty 90, 90d supply, fill #0
  Filled 2022-06-29: qty 90, 90d supply, fill #1

## 2022-02-16 MED ORDER — METFORMIN HCL 1000 MG PO TABS
1000.0000 mg | ORAL_TABLET | Freq: Two times a day (BID) | ORAL | 2 refills | Status: DC
  Filled 2022-02-16 – 2022-02-24 (×2): qty 60, 30d supply, fill #0

## 2022-02-16 MED ORDER — CARVEDILOL 6.25 MG PO TABS
6.2500 mg | ORAL_TABLET | Freq: Two times a day (BID) | ORAL | 3 refills | Status: DC
  Filled 2022-02-16 – 2022-02-28 (×2): qty 180, 90d supply, fill #0
  Filled 2022-07-19: qty 60, 30d supply, fill #1
  Filled 2022-08-15: qty 60, 30d supply, fill #2

## 2022-02-16 MED ORDER — ROSUVASTATIN CALCIUM 5 MG PO TABS
5.0000 mg | ORAL_TABLET | Freq: Every day | ORAL | 2 refills | Status: DC
  Filled 2022-02-16: qty 90, 90d supply, fill #0
  Filled 2022-03-11: qty 30, 30d supply, fill #0
  Filled 2022-04-22: qty 30, 30d supply, fill #1
  Filled 2022-06-03: qty 30, 30d supply, fill #2
  Filled 2022-07-06 (×2): qty 30, 30d supply, fill #3
  Filled 2022-08-26: qty 30, 30d supply, fill #4
  Filled 2022-10-06: qty 30, 30d supply, fill #5
  Filled 2022-11-30: qty 30, 30d supply, fill #6
  Filled 2023-01-18: qty 30, 30d supply, fill #7
  Filled 2023-02-16: qty 30, 30d supply, fill #8

## 2022-02-16 MED ORDER — OMEPRAZOLE 20 MG PO CPDR
20.0000 mg | DELAYED_RELEASE_CAPSULE | Freq: Every day | ORAL | 1 refills | Status: DC | PRN
  Filled 2022-02-16: qty 30, 30d supply, fill #0
  Filled 2022-02-24: qty 90, 90d supply, fill #0

## 2022-02-16 MED ORDER — OMEGA-3-ACID ETHYL ESTERS 1 G PO CAPS
2.0000 | ORAL_CAPSULE | Freq: Two times a day (BID) | ORAL | 1 refills | Status: DC
  Filled 2022-02-16 – 2022-02-24 (×2): qty 120, 30d supply, fill #0
  Filled 2022-07-27: qty 120, 30d supply, fill #1

## 2022-02-16 MED ORDER — TRIAMCINOLONE ACETONIDE 0.1 % EX CREA
1.0000 | TOPICAL_CREAM | Freq: Two times a day (BID) | CUTANEOUS | 3 refills | Status: DC
  Filled 2022-02-16: qty 60, 30d supply, fill #0

## 2022-02-16 NOTE — Progress Notes (Signed)
 Assessment & Plan:  Tatia was seen today for hypertension.  Diagnoses and all orders for this visit:  Primary hypertension -     amLODipine (NORVASC) 10 MG tablet; Take 1 tablet (10 mg total) by mouth daily. -     carvedilol (COREG) 6.25 MG tablet; Take 1 tablet (6.25 mg total) by mouth 2 (two) times daily. Continue all antihypertensives as prescribed.  Reminded to bring in blood pressure log for follow  up appointment.  RECOMMENDATIONS: DASH/Mediterranean Diets are healthier choices for HTN.     Atopic dermatitis, unspecified type -     triamcinolone cream (KENALOG) 0.1 %; Apply 1 Application topically 2 (two) times daily.  Parasite infection -     Giardia, EIA; Ova/Parasite  Need for immunization against influenza -     Flu Vaccine QUAD 6mo+IM (Fluarix, Fluzone & Alfiuria Quad PF)  Uncontrolled type 2 diabetes mellitus with hyperglycemia, without long-term current use of insulin (HCC) -     glipiZIDE (GLUCOTROL XL) 10 MG 24 hr tablet; Take 2 tablets (20 mg total) by mouth daily with breakfast. -     metFORMIN (GLUCOPHAGE) 1000 MG tablet; TAKE 1 TABLET (1,000 MG TOTAL) BY MOUTH 2 (TWO) TIMES DAILY WITH A MEAL.  Gastroesophageal reflux disease without esophagitis -     omeprazole (PRILOSEC) 20 MG capsule; Take 1 capsule (20 mg total) by mouth daily as needed. PARA ACIDO  Hypertriglyceridemia -     omega-3 acid ethyl esters (LOVAZA) 1 g capsule; TAKE 2 CAPSULES (2 G TOTAL) BY MOUTH 2 (TWO) TIMES DAILY.  Pure hypercholesterolemia -     rosuvastatin (CRESTOR) 5 MG tablet; Take 1 tablet (5 mg total) by mouth daily.    Patient has been counseled on age-appropriate routine health concerns for screening and prevention. These are reviewed and up-to-date. Referrals have been placed accordingly. Immunizations are up-to-date or declined.    Subjective:   Chief Complaint  Patient presents with   Hypertension   HPI Erin Huff 66 y.o. female presents to office  today for HTN. She also states her husband was recently given "a dewormer" and she was instructed to be tested and treated for a parasite infection.  She can not recall any specific diagnosis.   HTN  Blood pressure is well controlled with amlodipine 10 mg daily, carvedilol 6.25 mg BID, lisinopril 20 mg daily.  BP Readings from Last 3 Encounters:  02/16/22 134/78  01/24/22 122/71  01/19/22 132/80     She has a red patchy macular Rash on the back of the lower right leg. States the rash comes and goes when she applies an OTC ointment however she can not recall the nam of the ointment she has been using. Associated symptoms include pruritus.    Review of Systems  Constitutional:  Negative for fever, malaise/fatigue and weight loss.  HENT: Negative.  Negative for nosebleeds.   Eyes: Negative.  Negative for blurred vision, double vision and photophobia.  Respiratory: Negative.  Negative for cough and shortness of breath.   Cardiovascular: Negative.  Negative for chest pain, palpitations and leg swelling.  Gastrointestinal: Negative.  Negative for heartburn, nausea and vomiting.  Musculoskeletal: Negative.  Negative for myalgias.  Skin:  Positive for itching and rash.  Neurological: Negative.  Negative for dizziness, focal weakness, seizures and headaches.  Psychiatric/Behavioral: Negative.  Negative for suicidal ideas.     Past Medical History:  Diagnosis Date   Allergy    Depression    mild   Diabetes mellitus without   complication (HCC)    GERD (gastroesophageal reflux disease)    Hyperlipidemia    Hypertension    Moderate to severe aortic stenosis 03/2011   Moderate to severe AS noted on echo with mean gradient 27 mmHg.  Also noted moderate dilation of the ascending aorta-46 mm.   Thoracic aortic aneurysm ([place]) 04/06/2021   Measuring 46 mm on echo   Thyroid disease     Past Surgical History:  Procedure Laterality Date   NO PAST SURGERIES     TRANSTHORACIC ECHOCARDIOGRAM   04/05/2021   EF 60 to 65%.  GR 1 DD.  Normal RV size and mildly elevated RVSP.  Normal RAP.  Moderate to Severe Aortic Stenosis.  Mean gradient 27 mmHg.  Moderate dilation of the ascending aorta measuring 46 mm.  Recommend CTA    Family History  Problem Relation Age of Onset   Hypertension Sister    Diabetes Brother    Breast cancer Maternal Aunt    Colon cancer Neg Hx    Colon polyps Neg Hx    Esophageal cancer Neg Hx    Rectal cancer Neg Hx    Stomach cancer Neg Hx     Social History Reviewed with no changes to be made today.   Outpatient Medications Prior to Visit  Medication Sig Dispense Refill   Blood Glucose Monitoring Suppl (TRUE METRIX METER) w/Device KIT Use as directed 1 kit 0   glucose blood (TRUE METRIX BLOOD GLUCOSE TEST) test strip USE AS INSTRUCTED. 100 strip 3   ibuprofen (ADVIL) 600 MG tablet Take 600 mg by mouth as needed.     levothyroxine (SYNTHROID) 100 MCG tablet Take 1 tablet (100 mcg total) by mouth daily before breakfast. 30 tablet 2   lisinopril (ZESTRIL) 20 MG tablet Take 1 tablet (20 mg total) by mouth daily. 30 tablet 2   loratadine (CLARITIN) 10 MG tablet Take 1 tablet (10 mg total) by mouth daily. 90 tablet 3   Multiple Vitamin (MULTIVITAMIN) tablet Take 1 tablet by mouth daily.     OVER THE COUNTER MEDICATION nutrilite carb blocker as needed- helps with constipation     TRUEplus Lancets 28G MISC Use as directed 100 each 5   Vitamin D, Ergocalciferol, (DRISDOL) 1.25 MG (50000 UNIT) CAPS capsule Take 1 capsule (50,000 Units total) by mouth every 7 (seven) days. 12 capsule 0   amLODipine (NORVASC) 10 MG tablet Take 1 tablet (10 mg total) by mouth daily. 90 tablet 1   carvedilol (COREG) 6.25 MG tablet Take 1 tablet (6.25 mg total) by mouth 2 (two) times daily. 180 tablet 3   glipiZIDE (GLUCOTROL XL) 10 MG 24 hr tablet Take 2 tablets (20 mg total) by mouth daily with breakfast. 180 tablet 1   metFORMIN (GLUCOPHAGE) 1000 MG tablet TAKE 1 TABLET (1,000 MG  TOTAL) BY MOUTH 2 (TWO) TIMES DAILY WITH A MEAL. 60 tablet 2   rosuvastatin (CRESTOR) 5 MG tablet Take 1 tablet (5 mg total) by mouth daily. 30 tablet 2   diclofenac Sodium (VOLTAREN) 1 % GEL Apply topically as needed. (Patient not taking: Reported on 02/16/2022)     omega-3 acid ethyl esters (LOVAZA) 1 g capsule TAKE 2 CAPSULES (2 G TOTAL) BY MOUTH 2 (TWO) TIMES DAILY. 360 capsule 1   omeprazole (PRILOSEC) 20 MG capsule Take 1 capsule (20 mg total) by mouth daily as needed. PARA ACIDO 30 capsule 1   triamcinolone cream (KENALOG) 0.1 % Apply 1 Application topically 2 (two) times daily. (Patient not taking:  Reported on 01/19/2022) 60 g 1   Facility-Administered Medications Prior to Visit  Medication Dose Route Frequency Provider Last Rate Last Admin   0.9 %  sodium chloride infusion  500 mL Intravenous Continuous Pyrtle, Lajuan Lines, MD        Allergies  Allergen Reactions   Motrin [Ibuprofen] Rash   Penicillins Rash       Objective:    BP 134/78   Pulse 70   Ht 5' (1.524 m)   Wt 178 lb 12.8 oz (81.1 kg)   SpO2 97%   BMI 34.92 kg/m  Wt Readings from Last 3 Encounters:  02/16/22 178 lb 12.8 oz (81.1 kg)  01/19/22 178 lb 9.6 oz (81 kg)  09/10/21 172 lb 3.2 oz (78.1 kg)    Physical Exam Vitals and nursing note reviewed.  Constitutional:      Appearance: She is well-developed.  HENT:     Head: Normocephalic and atraumatic.  Cardiovascular:     Rate and Rhythm: Normal rate and regular rhythm.     Heart sounds: Normal heart sounds. No murmur heard.    No friction rub. No gallop.  Pulmonary:     Effort: Pulmonary effort is normal. No tachypnea or respiratory distress.     Breath sounds: Normal breath sounds. No decreased breath sounds, wheezing, rhonchi or rales.  Chest:     Chest wall: No tenderness.  Abdominal:     General: Bowel sounds are normal.     Palpations: Abdomen is soft.  Musculoskeletal:        General: Normal range of motion.     Cervical back: Normal range of  motion.  Skin:    General: Skin is warm and dry.     Findings: Rash present. Rash is macular.       Neurological:     Mental Status: She is alert and oriented to person, place, and time.     Coordination: Coordination normal.  Psychiatric:        Behavior: Behavior normal. Behavior is cooperative.        Thought Content: Thought content normal.        Judgment: Judgment normal.          Patient has been counseled extensively about nutrition and exercise as well as the importance of adherence with medications and regular follow-up. The patient was given clear instructions to go to ER or return to medical center if symptoms don't improve, worsen or new problems develop. The patient verbalized understanding.   Follow-up: Return in about 8 weeks (around 04/13/2022) for DM/ THYROID.   Gildardo Pounds, FNP-BC [facility] and [facility] [place], [place]   02/16/2022, 6:03 PM

## 2022-02-16 NOTE — Progress Notes (Signed)
Erin Huff [phone]

## 2022-02-17 ENCOUNTER — Other Ambulatory Visit: Payer: Self-pay

## 2022-02-18 ENCOUNTER — Other Ambulatory Visit: Payer: Self-pay

## 2022-02-24 ENCOUNTER — Other Ambulatory Visit: Payer: Self-pay

## 2022-02-25 LAB — GIARDIA, EIA; OVA/PARASITE: Giardia Ag, Stl: NEGATIVE

## 2022-02-28 ENCOUNTER — Other Ambulatory Visit: Payer: Self-pay

## 2022-03-11 ENCOUNTER — Other Ambulatory Visit: Payer: Self-pay

## 2022-03-14 ENCOUNTER — Other Ambulatory Visit: Payer: Self-pay

## 2022-03-18 ENCOUNTER — Ambulatory Visit (HOSPITAL_COMMUNITY): Payer: Self-pay | Attending: Cardiology

## 2022-03-18 DIAGNOSIS — I350 Nonrheumatic aortic (valve) stenosis: Secondary | ICD-10-CM | POA: Insufficient documentation

## 2022-03-18 DIAGNOSIS — I77810 Thoracic aortic ectasia: Secondary | ICD-10-CM | POA: Insufficient documentation

## 2022-03-20 LAB — ECHOCARDIOGRAM COMPLETE
AR max vel: 0.79 cm2
AV Area VTI: 0.78 cm2
AV Area mean vel: 0.71 cm2
AV Mean grad: 32 mmHg
AV Peak grad: 55.4 mmHg
Ao pk vel: 3.72 m/s
Area-P 1/2: 4.21 cm2
S' Lateral: 2.8 cm

## 2022-04-01 ENCOUNTER — Other Ambulatory Visit: Payer: Self-pay

## 2022-04-11 ENCOUNTER — Encounter: Payer: Self-pay | Admitting: Nurse Practitioner

## 2022-04-11 ENCOUNTER — Ambulatory Visit: Payer: Self-pay | Attending: Nurse Practitioner | Admitting: Nurse Practitioner

## 2022-04-11 ENCOUNTER — Other Ambulatory Visit: Payer: Self-pay

## 2022-04-11 VITALS — BP 130/78 | HR 76 | Ht 60.0 in | Wt 181.6 lb

## 2022-04-11 DIAGNOSIS — R21 Rash and other nonspecific skin eruption: Secondary | ICD-10-CM

## 2022-04-11 DIAGNOSIS — K219 Gastro-esophageal reflux disease without esophagitis: Secondary | ICD-10-CM

## 2022-04-11 DIAGNOSIS — E1165 Type 2 diabetes mellitus with hyperglycemia: Secondary | ICD-10-CM

## 2022-04-11 DIAGNOSIS — E039 Hypothyroidism, unspecified: Secondary | ICD-10-CM

## 2022-04-11 DIAGNOSIS — I10 Essential (primary) hypertension: Secondary | ICD-10-CM

## 2022-04-11 LAB — POCT GLYCOSYLATED HEMOGLOBIN (HGB A1C): Hemoglobin A1C: 8 % — AB (ref 4.0–5.6)

## 2022-04-11 LAB — GLUCOSE, POCT (MANUAL RESULT ENTRY): POC Glucose: 254 mg/dl — AB (ref 70–99)

## 2022-04-11 MED ORDER — METFORMIN HCL 1000 MG PO TABS
1000.0000 mg | ORAL_TABLET | Freq: Two times a day (BID) | ORAL | 2 refills | Status: DC
  Filled 2022-04-11: qty 180, 90d supply, fill #0

## 2022-04-11 MED ORDER — GLIPIZIDE ER 10 MG PO TB24
20.0000 mg | ORAL_TABLET | Freq: Every day | ORAL | 1 refills | Status: DC
  Filled 2022-04-11: qty 180, 90d supply, fill #0
  Filled 2022-06-03: qty 60, 30d supply, fill #0
  Filled 2022-07-28: qty 60, 30d supply, fill #1
  Filled 2022-09-02: qty 60, 30d supply, fill #2
  Filled 2022-10-06: qty 60, 30d supply, fill #3

## 2022-04-11 MED ORDER — TRIAMCINOLONE ACETONIDE 0.5 % EX OINT
1.0000 | TOPICAL_OINTMENT | Freq: Two times a day (BID) | CUTANEOUS | 1 refills | Status: DC
  Filled 2022-04-11: qty 15, 10d supply, fill #0

## 2022-04-11 MED ORDER — LISINOPRIL 20 MG PO TABS
20.0000 mg | ORAL_TABLET | Freq: Every day | ORAL | 2 refills | Status: DC
  Filled 2022-04-11: qty 90, 90d supply, fill #0

## 2022-04-11 MED ORDER — LEVOTHYROXINE SODIUM 100 MCG PO TABS
100.0000 ug | ORAL_TABLET | Freq: Every day | ORAL | 2 refills | Status: DC
  Filled 2022-04-11 – 2022-04-22 (×2): qty 90, 90d supply, fill #0
  Filled 2022-07-25: qty 90, 90d supply, fill #1
  Filled 2022-10-18: qty 90, 90d supply, fill #2

## 2022-04-11 MED ORDER — OMEPRAZOLE 20 MG PO CPDR
20.0000 mg | DELAYED_RELEASE_CAPSULE | Freq: Every day | ORAL | 1 refills | Status: DC | PRN
  Filled 2022-04-11: qty 90, 90d supply, fill #0

## 2022-04-11 NOTE — Progress Notes (Signed)
Right foot swollen.   Labs from 11/22 and 12/29 discussed in office.

## 2022-04-11 NOTE — Patient Instructions (Signed)
MUCINEX COLD AND COUGH

## 2022-04-11 NOTE — Progress Notes (Signed)
Assessment & Plan:  Erin Huff was seen today for diabetes and hypertension.  Diagnoses and all orders for this visit: BRING ALL MEDICATIONS TO NEXT OFFICE VISIT  Uncontrolled type 2 diabetes mellitus with hyperglycemia, without long-term current use of insulin (HCC) -     POCT glucose (manual entry) -     POCT glycosylated hemoglobin (Hb A1C) -     CMP14+EGFR -     glipiZIDE (GLUCOTROL XL) 10 MG 24 hr tablet; Take 2 tablets (20 mg total) by mouth daily with breakfast. -     metFORMIN (GLUCOPHAGE) 1000 MG tablet; Take 1 tablet (1,000 mg total) by mouth 2 (two) times daily with a meal. Continue blood sugar control as discussed in office today, low carbohydrate diet, and regular physical exercise as tolerated, 150 minutes per week (30 min each day, 5 days per week, or 50 min 3 days per week). Keep blood sugar logs with fasting goal of 90-130 mg/dl, post prandial (after you eat) less than 180.  For Hypoglycemia: BS <60 and Hyperglycemia BS >400; contact the clinic ASAP. Annual eye exams and foot exams are recommended.   Primary hypertension -     lisinopril (ZESTRIL) 20 MG tablet; Take 1 tablet (20 mg total) by mouth daily. Continue all antihypertensives as prescribed.  Reminded to bring in blood pressure log for follow  up appointment.  RECOMMENDATIONS: DASH/Mediterranean Diets are healthier choices for HTN.    Rash -     Ambulatory referral to Dermatology -     triamcinolone ointment (KENALOG) 0.5 %; Apply 1 Application topically 2 (two) times daily.  Acquired hypothyroidism -     Thyroid Panel With TSH -     levothyroxine (SYNTHROID) 100 MCG tablet; Take 1 tablet (100 mcg total) by mouth daily before breakfast.  Gastroesophageal reflux disease without esophagitis -     omeprazole (PRILOSEC) 20 MG capsule; Take 1 capsule (20 mg total) by mouth daily as needed. PARA ACIDO INSTRUCTIONS: Avoid GERD Triggers: acidic, spicy or fried foods, caffeine, coffee, sodas,  alcohol and chocolate.       Patient has been counseled on age-appropriate routine health concerns for screening and prevention. These are reviewed and up-to-date. Referrals have been placed accordingly. Immunizations are up-to-date or declined.    Subjective:   Chief Complaint  Patient presents with   Diabetes   Hypertension   HPI Erin Huff 67 y.o. female presents to office today for follow up to DM and HTN   SKIN She has a red patchy macular Rash on the lateral lower right leg. States the rash comes and goes when she applies an OTC ointment however she can not recall the nam of the ointment she has been using. Associated symptoms include pruritus. Kenalog 0.1% has been ineffective.     URI Onset 3 days ago. Symptoms include: fatigue, rhinorrhea, sinus pressure and pain. Denies headache, cough, fever, sore throat. States everyone has been sick in her family as well.   DM 2 Blood glucose is elevated today as well as A1c. She had banana with coffee and cream today. A1c is up to 8 today from 7.2.  She states her blood glucose levels have been elevated since she has been experiencing URI symptoms. Lab Results  Component Value Date   HGBA1C 8.0 (A) 04/11/2022    Lab Results  Component Value Date   HGBA1C 7.2 (H) 12/16/2021  LDL at goal.  Lab Results  Component Value Date   LDLCALC 52 12/16/2021  HTN Blood pressure is well controlled. She is taking amlodipine 10 mg daily, carvedilol 6.25 mg BID, and lisinopril 20 mg daily.  BP Readings from Last 3 Encounters:  04/11/22 130/78  02/16/22 134/78  01/24/22 122/71    Hypothyroidism Normal thyroid levels. She is taking levothyroxine 100 mg daily as prescribed.  Lab Results  Component Value Date   TSH 1.570 09/10/2021   T4TOTAL 10.3 09/10/2021    Review of Systems  Constitutional:  Negative for fever, malaise/fatigue and weight loss.  HENT: Negative.  Negative for nosebleeds.   Eyes: Negative.  Negative for blurred vision,  double vision and photophobia.  Respiratory: Negative.  Negative for cough and shortness of breath.   Cardiovascular: Negative.  Negative for chest pain, palpitations and leg swelling.  Gastrointestinal: Negative.  Negative for heartburn, nausea and vomiting.  Musculoskeletal: Negative.  Negative for myalgias.  Neurological: Negative.  Negative for dizziness, focal weakness, seizures and headaches.  Psychiatric/Behavioral: Negative.  Negative for suicidal ideas.     Past Medical History:  Diagnosis Date   Allergy    Depression    mild   Diabetes mellitus without complication (HCC)    GERD (gastroesophageal reflux disease)    Hyperlipidemia    Hypertension    Moderate to severe aortic stenosis 03/2011   Moderate to severe AS noted on echo with mean gradient 27 mmHg.  Also noted moderate dilation of the ascending aorta-46 mm.   Thoracic aortic aneurysm ([place]) 04/06/2021   Measuring 46 mm on echo   Thyroid disease     Past Surgical History:  Procedure Laterality Date   NO PAST SURGERIES     TRANSTHORACIC ECHOCARDIOGRAM  04/05/2021   EF 60 to 65%.  GR 1 DD.  Normal RV size and mildly elevated RVSP.  Normal RAP.  Moderate to Severe Aortic Stenosis.  Mean gradient 27 mmHg.  Moderate dilation of the ascending aorta measuring 46 mm.  Recommend CTA    Family History  Problem Relation Age of Onset   Hypertension Sister    Diabetes Brother    Breast cancer Maternal Aunt    Colon cancer Neg Hx    Colon polyps Neg Hx    Esophageal cancer Neg Hx    Rectal cancer Neg Hx    Stomach cancer Neg Hx     Social History Reviewed with no changes to be made today.   Outpatient Medications Prior to Visit  Medication Sig Dispense Refill   amLODipine (NORVASC) 10 MG tablet Take 1 tablet (10 mg total) by mouth daily. 90 tablet 1   Blood Glucose Monitoring Suppl (TRUE METRIX METER) w/Device KIT Use as directed 1 kit 0   carvedilol (COREG) 6.25 MG tablet Take 1 tablet (6.25 mg total) by mouth 2  (two) times daily. 180 tablet 3   glucose blood (TRUE METRIX BLOOD GLUCOSE TEST) test strip USE AS INSTRUCTED. 100 strip 3   ibuprofen (ADVIL) 600 MG tablet Take 600 mg by mouth as needed.     loratadine (CLARITIN) 10 MG tablet Take 1 tablet (10 mg total) by mouth daily. 90 tablet 3   omega-3 acid ethyl esters (LOVAZA) 1 g capsule Take 2 capsules (2 g total) by mouth 2 (two) times daily. 360 capsule 1   rosuvastatin (CRESTOR) 5 MG tablet Take 1 tablet (5 mg total) by mouth daily. 90 tablet 2   TRUEplus Lancets 28G MISC Use as directed 100 each 5   Vitamin D, Ergocalciferol, (DRISDOL) 1.25 MG (50000 UNIT) CAPS capsule Take  1 capsule (50,000 Units total) by mouth every 7 (seven) days. 12 capsule 0   levothyroxine (SYNTHROID) 100 MCG tablet Take 1 tablet (100 mcg total) by mouth daily before breakfast. 30 tablet 2   lisinopril (ZESTRIL) 20 MG tablet Take 1 tablet (20 mg total) by mouth daily. 30 tablet 2   metFORMIN (GLUCOPHAGE) 1000 MG tablet Take 1 tablet (1,000 mg total) by mouth 2 (two) times daily with a meal. 60 tablet 2   omeprazole (PRILOSEC) 20 MG capsule Take 1 capsule (20 mg total) by mouth daily as needed. PARA ACIDO 90 capsule 1   triamcinolone cream (KENALOG) 0.1 % Apply 1 Application topically 2 (two) times daily. 60 g 3   diclofenac Sodium (VOLTAREN) 1 % GEL Apply topically as needed. (Patient not taking: Reported on 02/16/2022)     Multiple Vitamin (MULTIVITAMIN) tablet Take 1 tablet by mouth daily. (Patient not taking: Reported on 04/11/2022)     OVER THE COUNTER MEDICATION nutrilite carb blocker as needed- helps with constipation (Patient not taking: Reported on 04/11/2022)     glipiZIDE (GLUCOTROL XL) 10 MG 24 hr tablet Take 2 tablets (20 mg total) by mouth daily with breakfast. (Patient not taking: Reported on 04/11/2022) 180 tablet 1   Facility-Administered Medications Prior to Visit  Medication Dose Route Frequency Provider Last Rate Last Admin   0.9 %  sodium chloride infusion   500 mL Intravenous Continuous Pyrtle, Lajuan Lines, MD        Allergies  Allergen Reactions   Motrin [Ibuprofen] Rash   Penicillins Rash       Objective:    BP 130/78   Pulse 76   Ht 5' (1.524 m)   Wt 181 lb 9.6 oz (82.4 kg)   SpO2 99%   BMI 35.47 kg/m  Wt Readings from Last 3 Encounters:  04/11/22 181 lb 9.6 oz (82.4 kg)  02/16/22 178 lb 12.8 oz (81.1 kg)  01/19/22 178 lb 9.6 oz (81 kg)    Physical Exam Vitals and nursing note reviewed.  Constitutional:      Appearance: She is well-developed.  HENT:     Head: Normocephalic and atraumatic.  Cardiovascular:     Rate and Rhythm: Normal rate and regular rhythm.     Heart sounds: Normal heart sounds. No murmur heard.    No friction rub. No gallop.  Pulmonary:     Effort: Pulmonary effort is normal. No tachypnea or respiratory distress.     Breath sounds: Normal breath sounds. No decreased breath sounds, wheezing, rhonchi or rales.  Chest:     Chest wall: No tenderness.  Abdominal:     General: Bowel sounds are normal.     Palpations: Abdomen is soft.  Musculoskeletal:        General: Normal range of motion.     Cervical back: Normal range of motion.  Skin:    General: Skin is warm and dry.  Neurological:     Mental Status: She is alert and oriented to person, place, and time.     Coordination: Coordination normal.  Psychiatric:        Behavior: Behavior normal. Behavior is cooperative.        Thought Content: Thought content normal.        Judgment: Judgment normal.          Patient has been counseled extensively about nutrition and exercise as well as the importance of adherence with medications and regular follow-up. The patient was given clear instructions to go  to ER or return to medical center if symptoms don't improve, worsen or new problems develop. The patient verbalized understanding.   Follow-up: Return in about 14 weeks (around 07/18/2022).   Gildardo Pounds, FNP-BC [facility] and  [place] [facility], [place]   04/11/2022, 7:07 PM

## 2022-04-12 LAB — CMP14+EGFR
ALT: 14 IU/L (ref 0–32)
AST: 19 IU/L (ref 0–40)
Albumin/Globulin Ratio: 1.7 (ref 1.2–2.2)
Albumin: 4.5 g/dL (ref 3.9–4.9)
Alkaline Phosphatase: 120 IU/L (ref 44–121)
BUN/Creatinine Ratio: 15 (ref 12–28)
BUN: 9 mg/dL (ref 8–27)
Bilirubin Total: 0.3 mg/dL (ref 0.0–1.2)
CO2: 17 mmol/L — ABNORMAL LOW (ref 20–29)
Calcium: 9.6 mg/dL (ref 8.7–10.3)
Chloride: 103 mmol/L (ref 96–106)
Creatinine, Ser: 0.62 mg/dL (ref 0.57–1.00)
Globulin, Total: 2.7 g/dL (ref 1.5–4.5)
Glucose: 247 mg/dL — ABNORMAL HIGH (ref 70–99)
Potassium: 4.7 mmol/L (ref 3.5–5.2)
Sodium: 135 mmol/L (ref 134–144)
Total Protein: 7.2 g/dL (ref 6.0–8.5)
eGFR: 99 mL/min/{1.73_m2} (ref >59)

## 2022-04-12 LAB — THYROID PANEL WITH TSH
Free Thyroxine Index: 2.3 (ref 1.2–4.9)
T3 Uptake Ratio: 25 % (ref 24–39)
T4, Total: 9.1 ug/dL (ref 4.5–12.0)
TSH: 4.03 u[IU]/mL (ref 0.450–4.500)

## 2022-04-14 ENCOUNTER — Ambulatory Visit: Payer: Self-pay | Attending: Nurse Practitioner

## 2022-04-22 ENCOUNTER — Other Ambulatory Visit: Payer: Self-pay

## 2022-04-27 ENCOUNTER — Telehealth: Payer: Self-pay | Admitting: Nurse Practitioner

## 2022-04-27 NOTE — Telephone Encounter (Signed)
Using Spanish interpreter [place] , # [phone], given lab results. Verbalizes understanding.

## 2022-05-03 ENCOUNTER — Encounter: Payer: Self-pay | Admitting: Dermatology

## 2022-06-01 ENCOUNTER — Encounter: Payer: Self-pay | Admitting: Cardiology

## 2022-06-01 ENCOUNTER — Ambulatory Visit: Payer: Self-pay | Attending: Cardiology | Admitting: Cardiology

## 2022-06-01 VITALS — BP 122/76 | HR 71 | Ht 60.0 in | Wt 179.6 lb

## 2022-06-01 DIAGNOSIS — I10 Essential (primary) hypertension: Secondary | ICD-10-CM

## 2022-06-01 DIAGNOSIS — Z6834 Body mass index (BMI) 34.0-34.9, adult: Secondary | ICD-10-CM

## 2022-06-01 DIAGNOSIS — E1169 Type 2 diabetes mellitus with other specified complication: Secondary | ICD-10-CM

## 2022-06-01 DIAGNOSIS — E785 Hyperlipidemia, unspecified: Secondary | ICD-10-CM

## 2022-06-01 DIAGNOSIS — I350 Nonrheumatic aortic (valve) stenosis: Secondary | ICD-10-CM

## 2022-06-01 DIAGNOSIS — I77810 Thoracic aortic ectasia: Secondary | ICD-10-CM

## 2022-06-01 DIAGNOSIS — E6609 Other obesity due to excess calories: Secondary | ICD-10-CM

## 2022-06-01 NOTE — Patient Instructions (Addendum)
Medication Instructions:   No changes   *If you need a refill on your cardiac medications before your next appointment, please call your pharmacy*   Lab Work:  [facility] in Jan 2025  If you have labs (blood work) drawn today and your tests are completely normal, you will receive your results only by: MyChart Message (if you have MyChart) OR A paper copy in the mail If you have any lab test that is abnormal or we need to change your treatment, we will call you to review the results.   Testing/Procedures:   Both Will be schedule in Jan 2025  Your physician has requested that you have an echocardiogram. Echocardiography is a painless test that uses sound waves to create images of your heart. It provides your doctor with information about the size and shape of your heart and how well your heart's chambers and valves are working. This procedure takes approximately one hour. There are no restrictions for this procedure. Please do NOT wear cologne, perfume, aftershave, or lotions (deodorant is allowed). Please arrive 15 minutes prior to your appointment time. And Non-Cardiac CT Angiography (CTA) of the chest , is a special type of CT scan that uses a computer to produce multi-dimensional views of major blood vessels throughout the body. In CT angiography, a contrast material is injected through an IV to help visualize the blood vessels   Follow-Up: At [facility], you and your health needs are our priority.  As part of our continuing mission to provide you with exceptional heart care, we have created designated Provider Care Teams.  These Care Teams include your primary Cardiologist (physician) and Advanced Practice Providers (APPs -  Physician Assistants and Nurse Practitioners) who all work together to provide you with the care you need, when you need it.  We recommend signing up for the patient portal called "MyChart".  Sign up information is provided on this After Visit Summary.  MyChart is  used to connect with patients for Virtual Visits (Telemedicine).  Patients are able to view lab/test results, encounter notes, upcoming appointments, etc.  Non-urgent messages can be sent to your provider as well.   To learn more about what you can do with MyChart, go to [email or web address].    Your next appointment:   9 to 10  month(s) after test are completed  The format for your next appointment:   In Person  Provider:   Bryan Lemma, MD

## 2022-06-01 NOTE — Progress Notes (Signed)
Primary Care Provider: Claiborne RiggFleming, [name] W, NP [facility] Cardiologist: Bryan Lemmaavid Summerlynn Glauser, MD Electrophysiologist: None  Clinic Note: Chief Complaint  Patient presents with   Follow-up    4 months-discussed results of echo.   Cardiac Valve Problem    Moderate aortic stenosis with moderate aortic dilation. Echo results.    ===================================  ASSESSMENT/PLAN   Problem List Items Addressed This Visit       Cardiology Problems   Thoracic aortic ectasia (Chronic)    Echo suggested that the [place] aorta was 46 mm.  CT scan suggested that it was 4.2 x 4.2.  Not unexpected with aortic stenosis. Reassess CTA chest-aorta in January 2025 along with echocardiogram for correlation but also progression of disease.  This would determine whether or not she would need simple AVR versus AVR with aortic replacement.  Closely monitor blood pressure.  She is on amlodipine and carvedilol.  No longer on ACE inhibitor. Continue statin plus Lovaza. Continue glycemic control with glipizide. ->  Probably needs closer glycemic control consider GLP-1 agonist (depends on cost)      Relevant Orders   EKG 12-Lead (Completed)   ECHOCARDIOGRAM COMPLETE   CT ANGIO CHEST AORTA W/CM & OR WO/CM   Basic metabolic panel   Moderate to severe aortic stenosis - Primary (Chronic)    Progression of stenosis with mean gradient increased from 27 up to 32 mmHg.  Symptoms discussed, she remains asymptomatic.  Will plan to reassess Echo along with CTA Chest-Aorta January 2025 to assess cardiac valves and ascending thoracic aorta.      Relevant Orders   EKG 12-Lead (Completed)   ECHOCARDIOGRAM COMPLETE   CT ANGIO CHEST AORTA W/CM & OR WO/CM   Basic metabolic panel   Hyperlipidemia associated with type 2 diabetes mellitus (Chronic)    Most recent A1c went up to 8.0-had previously been 7.2.  She is only on glipizide.  I suspect that she may need more aggressive management with either GLP-1  agonist plus or minus SGLT2 inhibitor.  For lipids she is on Lovaza and low-dose rosuvastatin.  LDL is pretty well-controlled.  Triglycerides mildly elevated.  Likely related to glycemic control.      Essential hypertension (Chronic)     Other   OBESITY (Chronic)    Recommended that he stays active with diet control and exercise.  This was less dressed on her valve and is protected from a cardiovascular standpoint with ascending aortic dilation.  Consider GLP-1 agonist.  But also discussed importance of dietary modification and staying active with exercise.  Probably needs to do more than just her work level exertion.      ===================================  HPI:    Erin Huff is a 67 y.o. female with a PMH notable for uncontrolled DM-2, HTN, HLD, moderate to severe aortic stenosis, TAA who presents today for 4-678-month follow-up at the request of Claiborne RiggFleming, [name] W, NP.  Erin Huff was last seen on January 18, 2022 -> she was doing fairly well.  No symptoms aside from some mild chest tightness with coughing.  No heart failure symptoms or angina symptoms.  No exertional dyspnea.  No arrhythmia symptoms.  No syncope or near syncope or TIA/amaurosis fugax. Ordered 2D Echo  Recent Hospitalizations: None  Reviewed  CV studies:    The following studies were reviewed today: (if available, images/films reviewed: From Epic Chart or Care Everywhere) TTE 03/22/2022: EF 65 to 70%.  No RWMA.  GR 1 DD.  Normal PAP.  Mild LA dilation.  Normal MV.  Moderate AS (mean gradient 32 mmHg.  Moderate aortic dilation of 44 mm. Previous AoV MG was 27 mmHg, aorta measured at 46 mm  Interval History:   Erin Huff returns here today for routine follow-up accompanied by a [place] interpreter.  She is doing well.  Healthy with no major complaints. When I last saw her she was dealing with a cold.  That is no longer present.  She is no longer having any coughing or  dyspnea or chest tightness. We reviewed concerning symptoms for progression of her aortic stenosis.  We discussed symptoms of angina (chest pain or pressure with rest or exertion)/exertional dyspnea; CHF-PND, orthopnea or edema, dyspnea on exertion; syncope. She denies any active cardiac symptoms including any arrhythmia symptoms, TIA or amaurosis fugax.   CV Review of Symptoms (Summary): no chest pain or dyspnea on exertion negative for - edema, irregular heartbeat, orthopnea, palpitations, paroxysmal nocturnal dyspnea, rapid heart rate, shortness of breath, or lightheadedness, dizziness or wooziness, syncope/near syncope or TIA/amaurosis fugax, claudication  REVIEWED OF SYSTEMS   Review of Systems  Constitutional:  Negative for chills, malaise/fatigue and weight loss.  HENT:  Negative for nosebleeds.   Respiratory:  Negative for cough.   Gastrointestinal:  Negative for blood in stool and melena.  Genitourinary:  Negative for hematuria.  Musculoskeletal:  Negative for joint pain.  Neurological:  Negative for dizziness.  Psychiatric/Behavioral:  Negative for depression and memory loss. The patient is not nervous/anxious and does not have insomnia.   All other systems reviewed and are negative.  I have reviewed and (if needed) personally updated the patient's problem list, medications, allergies, past medical and surgical history, social and family history.   PAST MEDICAL HISTORY   Past Medical History:  Diagnosis Date   Allergy    Depression    mild   Diabetes mellitus without complication    GERD (gastroesophageal reflux disease)    Hyperlipidemia    Hypertension    Moderate to severe aortic stenosis 03/2011   Moderate to severe AS noted on echo with mean gradient 27 mmHg.  Also noted moderate dilation of the ascending aorta-46 mm.   Thoracic aortic aneurysm 04/06/2021   Measuring 46 mm on Echo; CT angiogram chest-aorta 08/10/2021: 4.2 x 4.2 ascending aortic dilation.  Mildly  enlarged heart.   Thyroid disease     PAST SURGICAL HISTORY   Past Surgical History:  Procedure Laterality Date   NO PAST SURGERIES     TRANSTHORACIC ECHOCARDIOGRAM  04/05/2021   EF 60 to 65%.  GR 1 DD.  Normal RV size and mildly elevated RVSP.  Normal RAP.  Moderate to Severe Aortic Stenosis.  Mean gradient 27 mmHg.  Moderate dilation of the ascending aorta measuring 46 mm.  Recommend CTA   CT Angiogram Chest-Aorta 08/16/2021: 4.2 x 4.2 ascending aortic dilation.  Mildly enlarged heart.  MEDICATIONS/ALLERGIES   Current Meds  Medication Sig   amLODipine (NORVASC) 10 MG tablet Take 1 tablet (10 mg total) by mouth daily.   Blood Glucose Monitoring Suppl (TRUE METRIX METER) w/Device KIT Use as directed   carvedilol (COREG) 6.25 MG tablet Take 1 tablet (6.25 mg total) by mouth 2 (two) times daily.   glipiZIDE (GLUCOTROL XL) 10 MG 24 hr tablet Take 2 tablets (20 mg total) by mouth daily with breakfast.   glucose blood (TRUE METRIX BLOOD GLUCOSE TEST)  USE AS INSTRUCTED.   levothyroxine (SYNTHROID) 100 MCG tablet Take 1 tablet (100 mcg total) by mouth daily before breakfast.  loratadine (CLARITIN) 10 MG tablet Take 1 tablet (10 mg total) by mouth daily.   metFORMIN (GLUCOPHAGE) 1000 MG tablet Take 1 tablet (1,000 mg total) by mouth 2 (two) times daily with a meal.   omega-3 acid ethyl esters (LOVAZA) 1 g capsule Take 2 capsules (2 g total) by mouth 2 (two) times daily.   omeprazole (PRILOSEC) 20 MG capsule Take 1 capsule (20 mg total) by mouth daily as needed. PARA ACIDO   rosuvastatin (CRESTOR) 5 MG tablet Take 1 tablet (5 mg total) by mouth daily.   TRUEplus Lancets 28G MISC Use as directed   Vitamin D, Ergocalciferol, (DRISDOL) 1.25 MG (50000 UNIT) CAPS capsule Take 1 capsule (50,000 Units total) by mouth every 7 (seven) days.   Allergies  Allergen Reactions   Motrin [Ibuprofen] Rash   Penicillins Rash    SOCIAL HISTORY/FAMILY HISTORY   Reviewed in Epic:  Pertinent findings:   Social History   Tobacco Use   Smoking status: Never   Smokeless tobacco: Never  Vaping Use   Vaping Use: Never used  Substance Use Topics   Alcohol use: No    Comment: rare    Drug use: No   Social History   Social History Narrative   Sometimes husband is aggressive but she talks back and is aggressive with him. Does not take it.       Native of [place], but has been living in [place] area for 18+ years.  Speaks Spanish only.      Usually active, enjoys doing Zumba exercise    OBJCTIVE -PE, EKG, labs   Wt Readings from Last 3 Encounters:  06/01/22 179 lb 9.6 oz (81.5 kg)  04/11/22 181 lb 9.6 oz (82.4 kg)  02/16/22 178 lb 12.8 oz (81.1 kg)    Physical Exam: BP 122/76 (BP Location: Right Arm, Patient Position: Sitting, Cuff Size: Normal)   Pulse 71   Ht 5' (1.524 m)   Wt 179 lb 9.6 oz (81.5 kg)   SpO2 97%   BMI 35.08 kg/m  Physical Exam Vitals reviewed.  Constitutional:      General: She is not in acute distress.    Appearance: Normal appearance. She is obese. She is not ill-appearing or toxic-appearing.     Comments: Well-nourished, well-groomed.  HENT:     Head: Normocephalic and atraumatic.  Neck:     Vascular: Normal carotid pulses (Stable carotid upstroke). No carotid bruit (Radiated aortic valve murmur) or JVD.  Cardiovascular:     Rate and Rhythm: Normal rate and regular rhythm. No extrasystoles are present.    Chest Wall: PMI is not displaced.     Pulses: Normal pulses.     Heart sounds: S1 normal and S2 normal. Heart sounds are distant. Murmur heard.     Harsh crescendo-decrescendo midsystolic murmur is present with a grade of 2/6 at the upper right sternal border radiating to the neck.     No friction rub. No gallop.  Pulmonary:     Effort: Pulmonary effort is normal. No respiratory distress.     Breath sounds: Normal breath sounds. No wheezing, rhonchi or rales.  Chest:     Chest wall: No tenderness.  Musculoskeletal:        General: No  swelling. Normal range of motion.     Cervical back: Normal range of motion and neck supple.  Skin:    General: Skin is warm and dry.     Coloration: Skin is not pale.  Neurological:  General: No focal deficit present.     Mental Status: She is alert and oriented to person, place, and time. Mental status is at baseline.     Gait: Gait normal.  Psychiatric:        Mood and Affect: Mood normal.        Behavior: Behavior normal.        Thought Content: Thought content normal.        Judgment: Judgment normal.     Adult ECG Report  Rate: 71 ;  Rhythm: normal sinus rhythm and nonspecific ST and T wave changes.  Otherwise normal axis, intervals and durations. ;   Narrative Interpretation: Stable  Recent Labs: Reviewed Lab Results  Component Value Date   CHOL 141 12/16/2021   HDL 63 12/16/2021   LDLCALC 52 12/16/2021   TRIG 158 (H) 12/16/2021   CHOLHDL 2.2 12/16/2021   Lab Results  Component Value Date   CREATININE 0.62 04/11/2022   BUN 9 04/11/2022   NA 135 04/11/2022   K 4.7 04/11/2022   CL 103 04/11/2022   CO2 17 (L) 04/11/2022      Latest Ref Rng & Units 09/10/2021    4:13 PM 07/30/2020    4:42 PM 04/17/2019    9:06 AM  CBC  WBC 3.4 - 10.8 x10E3/uL 7.5  8.7  8.2   Hemoglobin 11.1 - 15.9 g/dL 81.1  91.4  78.2   Hematocrit 34.0 - 46.6 % 36.9  37.1  38.5   Platelets 150 - 450 x10E3/uL 228  231  238     Lab Results  Component Value Date   HGBA1C 8.0 (A) 04/11/2022   Lab Results  Component Value Date   TSH 4.030 04/11/2022    ================================================== I spent a total of 45 minutes with the patient spent in direct patient consultation.  Additional time spent with chart review  / charting (studies, outside notes, etc): 16 min Total Time: 61 min  Current medicines are reviewed at length with the patient today.  (+/- concerns) N/A  Notice: This dictation was prepared with Dragon dictation along with smart phrase technology. Any  transcriptional errors that result from this process are unintentional and may not be corrected upon review.  Studies Ordered:   Orders Placed This Encounter  Procedures   CT ANGIO CHEST AORTA W/CM & OR WO/CM   Basic metabolic panel   EKG 12-Lead   ECHOCARDIOGRAM COMPLETE   No orders of the defined types were placed in this encounter.   Patient Instructions / Medication Changes & Studies & Tests Ordered   Patient Instructions  Medication Instructions:   No changes   *If you need a refill on your cardiac medications before your next appointment, please call your pharmacy*   Lab Work:  [facility] in Jan 2025  If you have labs (blood work) drawn today and your tests are completely normal, you will receive your results only by: MyChart Message (if you have MyChart) OR A paper copy in the mail If you have any lab test that is abnormal or we need to change your treatment, we will call you to review the results.   Testing/Procedures:   Both Will be schedule in Jan 2025  Your physician has requested that you have an echocardiogram. Echocardiography is a painless test that uses sound waves to create images of your heart. It provides your doctor with information about the size and shape of your heart and how well your heart's chambers and valves are working.  This procedure takes approximately one hour. There are no restrictions for this procedure. Please do NOT wear cologne, perfume, aftershave, or lotions (deodorant is allowed). Please arrive 15 minutes prior to your appointment time. And Non-Cardiac CT Angiography (CTA) of the chest , is a special type of CT scan that uses a computer to produce multi-dimensional views of major blood vessels throughout the body. In CT angiography, a contrast material is injected through an IV to help visualize the blood vessels   Follow-Up: At [facility] you and your health needs are our priority.  As part of our continuing mission to provide you with  exceptional heart care, we have created designated Provider Care Teams.  These Care Teams include your primary Cardiologist (physician) and Advanced Practice Providers (APPs -  Physician Assistants and Nurse Practitioners) who all work together to provide you with the care you need, when you need it.  We recommend signing up for the patient portal called "MyChart".  Sign up information is provided on this After Visit Summary.  MyChart is used to connect with patients for Virtual Visits (Telemedicine).  Patients are able to view lab/test results, encounter notes, upcoming appointments, etc.  Non-urgent messages can be sent to your provider as well.   To learn more about what you can do with MyChart, go to [email or web address].    Your next appointment:   9 to 10  month(s) after test are completed  The format for your next appointment:   In Person  Provider:   Bryan Lemma, MD      Marykay Lex, MD, MS Bryan Lemma, M.D., M.S. Interventional Cardiologist  [facility]  Pager # [phone] Phone # [phone] [address] [place], [place] [address]   Thank you for choosing [facility] [place]!!

## 2022-06-02 ENCOUNTER — Telehealth: Payer: Self-pay | Admitting: Licensed Clinical Social Worker

## 2022-06-02 NOTE — Progress Notes (Signed)
Heart and Vascular Care Navigation  06/02/2022  Erin Huff [birth year] [id number]  Reason for Referral: uninsured Patient is participating in a Managed Medicaid Plan: No, self pay only  Engaged with patient by telephone for initial visit for Heart and Vascular Care Coordination.                                                                                                   Assessment:                                     LCSW was able to reach pt today with assistance of [employer] #[id number].  Introduced self, role, reason for call. Pt confirmed home address, PCP, and emergency contacts. She lives with her husband and adult daughter. Pt does not currently work- pt husband is employed, hours are variable bc his job is dependent on weather. Pt has completed CAFA and applications before. Her previous one expired 02/2022. We reviewed needed documents and how to complete again. Pt had questions regarding income letter from spouse's employer. They will work on this. No additional questions or concerns at this time. Denied any major concerns with other SDOH needs. I will f/u with pt to ensure no additional questions/concerns.     HRT/VAS Care Coordination     Patients Home Cardiology Office [facility]   Outpatient Care Team Social Worker   Social Worker Name: Octavio Graves, [place], [phone]   Living arrangements for the past 2 months Single Family Home   Lives with: Spouse; Adult Children   Patient Current Insurance Coverage Self-Pay   Patient Has Concern With Paying Medical Bills Yes   Patient Concerns With Medical Bills previous CAFA expired   Medical Bill Referrals: CAFA, [place] [facility]   Does Patient Have Prescription Coverage? No   Patient Prescription Assistance Programs [facility]   [facility] Medications mailed application   Home Assistive Devices/Equipment None       Social History:                                                                              SDOH Screenings   Food Insecurity: No Food Insecurity (06/02/2022)  Housing: Low Risk  (06/02/2022)  Transportation Needs: Unmet Transportation Needs (09/02/2021)  Utilities: Not At Risk (06/02/2022)  Depression (PHQ2-9): Low Risk  (04/11/2022)  Financial Resource Strain: Medium Risk (06/02/2022)  Physical Activity: Insufficiently Active (05/09/2017)  Social Connections: Moderately Integrated (05/09/2017)  Stress: No Stress Concern Present (05/09/2017)  Tobacco Use: Low Risk  (06/01/2022)    SDOH Interventions: Financial Resources:  Financial Strain Interventions: Other (Comment) (CAFA; [employer]; [employer]) Editor, commissioning for [employer]  Food Insecurity:  Food Insecurity Interventions: Intervention Not Indicated  Housing Insecurity:  Housing Interventions: Intervention Not Indicated  Transportation:    Did not ask pt    Other Care Navigation Interventions:     Provided Pharmacy assistance resources [facility]   Follow-up plan:   LCSW has mailed pt the following: my card, [employer] application and med list, [employer] and [employer] all in [place]. I will f/u with pt next week to ensure applications received and answer any additional questions/concerns.

## 2022-06-03 ENCOUNTER — Other Ambulatory Visit: Payer: Self-pay

## 2022-06-05 NOTE — Progress Notes (Incomplete)
Primary Care Provider: Claiborne Rigg, NP [facility] Cardiologist: Bryan Lemma, MD Electrophysiologist: None  Clinic Note: No chief complaint on file.   ===================================  ASSESSMENT/PLAN   Problem List Items Addressed This Visit       Cardiology Problems   Thoracic aortic ectasia - Primary (Chronic)   Moderate to severe aortic stenosis (Chronic)   Hyperlipidemia associated with type 2 diabetes mellitus (Chronic)   Essential hypertension (Chronic)   ===================================  HPI:    Erin Huff is a 67 y.o. female with a PMH notable for uncontrolled DM-2, HTN, HLD, moderate to severe aortic stenosis, TAA who presents today for 4-85-month follow-up at the request of Claiborne Rigg, NP.  Erin Huff was last seen on January 18, 2022 -> she was doing fairly well.  No symptoms aside from some mild chest tightness with coughing.  No heart failure symptoms or angina symptoms.  No exertional dyspnea.  No arrhythmia symptoms.  No syncope or near syncope or TIA/amaurosis fugax. Ordered 2D Echo  Recent Hospitalizations: None  Reviewed  CV studies:    The following studies were reviewed today: (if available, images/films reviewed: From Epic Chart or Care Everywhere) TTE 03/22/2022: EF 65 to 70%.  No RWMA.  GR 1 DD.  Normal PAP.  Mild LA dilation.  Normal MV.  Moderate AS (mean gradient 32 mmHg.  Moderate aortic dilation of 44 mm. Previous AoV MG was 27 mmHg, aorta measured at 46 mm  Interval History:   Erin Huff returns here today for routine follow-up accompanied by a [place] interpreter.  She is doing well.  Healthy with no major complaints. When I last saw her she was dealing with a cold.  That is no longer present.  She is no longer having any coughing or dyspnea or chest tightness. We reviewed concerning symptoms for progression of her aortic stenosis.  We discussed symptoms of angina  (chest pain or pressure with rest or exertion)/exertional dyspnea; CHF-PND, orthopnea or edema, dyspnea on exertion; syncope. She denies any active cardiac symptoms including any arrhythmia symptoms, TIA or amaurosis fugax.   CV Review of Symptoms (Summary): no chest pain or dyspnea on exertion negative for - edema, irregular heartbeat, orthopnea, palpitations, paroxysmal nocturnal dyspnea, rapid heart rate, shortness of breath, or lightheadedness, dizziness or wooziness, syncope/near syncope or TIA/amaurosis fugax, claudication  REVIEWED OF SYSTEMS   Review of Systems  Constitutional:  Negative for chills, malaise/fatigue and weight loss.  HENT:  Negative for nosebleeds.   Respiratory:  Negative for cough.   Gastrointestinal:  Negative for blood in stool and melena.  Genitourinary:  Negative for hematuria.  Musculoskeletal:  Negative for joint pain.  Neurological:  Negative for dizziness.  Psychiatric/Behavioral:  Negative for depression and memory loss. The patient is not nervous/anxious and does not have insomnia.   All other systems reviewed and are negative.  I have reviewed and (if needed) personally updated the patient's problem list, medications, allergies, past medical and surgical history, social and family history.   PAST MEDICAL HISTORY   Past Medical History:  Diagnosis Date  . Allergy   . Depression    mild  . Diabetes mellitus without complication   . GERD (gastroesophageal reflux disease)   . Hyperlipidemia   . Hypertension   . Moderate to severe aortic stenosis 03/2011   Moderate to severe AS noted on echo with mean gradient 27 mmHg.  Also noted moderate dilation of the ascending aorta-46 mm.  . Thoracic aortic aneurysm 04/06/2021  Measuring 46 mm on echo  . Thyroid disease     PAST SURGICAL HISTORY   Past Surgical History:  Procedure Laterality Date  . NO PAST SURGERIES    . TRANSTHORACIC ECHOCARDIOGRAM  04/05/2021   EF 60 to 65%.  GR 1 DD.  Normal RV  size and mildly elevated RVSP.  Normal RAP.  Moderate to Severe Aortic Stenosis.  Mean gradient 27 mmHg.  Moderate dilation of the ascending aorta measuring 46 mm.  Recommend CTA   CT Angiogram Chest-Aorta 08/16/2021: 4.2 x 4.2 ascending aortic dilation.  Mildly enlarged heart.  MEDICATIONS/ALLERGIES   Current Meds  Medication Sig  . amLODipine (NORVASC) 10 MG tablet Take 1 tablet (10 mg total) by mouth daily.  . Blood Glucose Monitoring Suppl (TRUE METRIX METER) w/Device KIT Use as directed  . carvedilol (COREG) 6.25 MG tablet Take 1 tablet (6.25 mg total) by mouth 2 (two) times daily.  Marland Kitchen glipiZIDE (GLUCOTROL XL) 10 MG 24 hr tablet Take 2 tablets (20 mg total) by mouth daily with breakfast.  . glucose blood (TRUE METRIX BLOOD GLUCOSE TEST)  USE AS INSTRUCTED.  Marland Kitchen levothyroxine (SYNTHROID) 100 MCG tablet Take 1 tablet (100 mcg total) by mouth daily before breakfast.  . loratadine (CLARITIN) 10 MG tablet Take 1 tablet (10 mg total) by mouth daily.  . metFORMIN (GLUCOPHAGE) 1000 MG tablet Take 1 tablet (1,000 mg total) by mouth 2 (two) times daily with a meal.  . omega-3 acid ethyl esters (LOVAZA) 1 g capsule Take 2 capsules (2 g total) by mouth 2 (two) times daily.  Marland Kitchen omeprazole (PRILOSEC) 20 MG capsule Take 1 capsule (20 mg total) by mouth daily as needed. PARA ACIDO  . rosuvastatin (CRESTOR) 5 MG tablet Take 1 tablet (5 mg total) by mouth daily.  . TRUEplus Lancets 28G MISC Use as directed  . Vitamin D, Ergocalciferol, (DRISDOL) 1.25 MG (50000 UNIT) CAPS capsule Take 1 capsule (50,000 Units total) by mouth every 7 (seven) days.   Allergies  Allergen Reactions  . Motrin [Ibuprofen] Rash  . Penicillins Rash    SOCIAL HISTORY/FAMILY HISTORY   Reviewed in Epic:  Pertinent findings:  Social History   Tobacco Use  . Smoking status: Never  . Smokeless tobacco: Never  Vaping Use  . Vaping Use: Never used  Substance Use Topics  . Alcohol use: No    Comment: rare   . Drug use: No    Social History   Social History Narrative   Sometimes husband is aggressive but she talks back and is aggressive with him. Does not take it.       Native of [place], but has been living in [place] area for 18+ years.  Speaks Spanish only.      Usually active, enjoys doing Zumba exercise    OBJCTIVE -PE, EKG, labs   Wt Readings from Last 3 Encounters:  06/01/22 179 lb 9.6 oz (81.5 kg)  04/11/22 181 lb 9.6 oz (82.4 kg)  02/16/22 178 lb 12.8 oz (81.1 kg)    Physical Exam: BP 122/76 (BP Location: Right Arm, Patient Position: Sitting, Cuff Size: Normal)   Pulse 71   Ht 5' (1.524 m)   Wt 179 lb 9.6 oz (81.5 kg)   SpO2 97%   BMI 35.08 kg/m  Physical Exam Vitals reviewed.  Constitutional:      General: She is not in acute distress.    Appearance: Normal appearance. She is obese. She is not ill-appearing or toxic-appearing.     Comments:  Well-nourished, well-groomed.  HENT:     Head: Normocephalic and atraumatic.  Neck:     Vascular: Normal carotid pulses (Stable carotid upstroke). No carotid bruit (Radiated aortic valve murmur) or JVD.  Cardiovascular:     Rate and Rhythm: Normal rate and regular rhythm. No extrasystoles are present.    Chest Wall: PMI is not displaced.     Pulses: Normal pulses.     Heart sounds: S1 normal and S2 normal. Heart sounds are distant. Murmur heard.     Harsh crescendo-decrescendo midsystolic murmur is present with a grade of 2/6 at the upper right sternal border radiating to the neck.     No friction rub. No gallop.  Pulmonary:     Effort: Pulmonary effort is normal. No respiratory distress.     Breath sounds: Normal breath sounds. No wheezing, rhonchi or rales.  Chest:     Chest wall: No tenderness.  Musculoskeletal:        General: No swelling. Normal range of motion.     Cervical back: Normal range of motion and neck supple.  Skin:    General: Skin is warm and dry.     Coloration: Skin is not pale.  Neurological:     General: No  focal deficit present.     Mental Status: She is alert and oriented to person, place, and time. Mental status is at baseline.     Gait: Gait normal.  Psychiatric:        Mood and Affect: Mood normal.        Behavior: Behavior normal.        Thought Content: Thought content normal.        Judgment: Judgment normal.     Adult ECG Report  Rate: 71 ;  Rhythm: normal sinus rhythm and nonspecific ST and T wave changes.  Otherwise normal axis, intervals and durations. ;   Narrative Interpretation: Stable  Recent Labs: Reviewed Lab Results  Component Value Date   CHOL 141 12/16/2021   HDL 63 12/16/2021   LDLCALC 52 12/16/2021   TRIG 158 (H) 12/16/2021   CHOLHDL 2.2 12/16/2021   Lab Results  Component Value Date   CREATININE 0.62 04/11/2022   BUN 9 04/11/2022   NA 135 04/11/2022   K 4.7 04/11/2022   CL 103 04/11/2022   CO2 17 (L) 04/11/2022      Latest Ref Rng & Units 09/10/2021    4:13 PM 07/30/2020    4:42 PM 04/17/2019    9:06 AM  CBC  WBC 3.4 - 10.8 x10E3/uL 7.5  8.7  8.2   Hemoglobin 11.1 - 15.9 g/dL 16.1  09.6  04.5   Hematocrit 34.0 - 46.6 % 36.9  37.1  38.5   Platelets 150 - 450 x10E3/uL 228  231  238     Lab Results  Component Value Date   HGBA1C 8.0 (A) 04/11/2022   Lab Results  Component Value Date   TSH 4.030 04/11/2022    ================================================== I spent a total of 45 minutes with the patient spent in direct patient consultation.  Additional time spent with chart review  / charting (studies, outside notes, etc): 16 min Total Time: 61 min  Current medicines are reviewed at length with the patient today.  (+/- concerns) N/A  Notice: This dictation was prepared with Dragon dictation along with smart phrase technology. Any transcriptional errors that result from this process are unintentional and may not be corrected upon review.  Studies Ordered:   No  orders of the defined types were placed in this encounter.  No orders of the  defined types were placed in this encounter.   Patient Instructions / Medication Changes & Studies & Tests Ordered   Patient Instructions  Medication Instructions:     *If you need a refill on your cardiac medications before your next appointment, please call your pharmacy*   Lab Work:    If you have labs (blood work) drawn today and your tests are completely normal, you will receive your results only by: MyChart Message (if you have MyChart) OR A paper copy in the mail If you have any lab test that is abnormal or we need to change your treatment, we will call you to review the results.   Testing/Procedures:    Follow-Up: At [facility], you and your health needs are our priority.  As part of our continuing mission to provide you with exceptional heart care, we have created designated Provider Care Teams.  These Care Teams include your primary Cardiologist (physician) and Advanced Practice Providers (APPs -  Physician Assistants and Nurse Practitioners) who all work together to provide you with the care you need, when you need it.  We recommend signing up for the patient portal called "MyChart".  Sign up information is provided on this After Visit Summary.  MyChart is used to connect with patients for Virtual Visits (Telemedicine).  Patients are able to view lab/test results, encounter notes, upcoming appointments, etc.  Non-urgent messages can be sent to your provider as well.   To learn more about what you can do with MyChart, go to [email or web address].    Your next appointment:   9 month(s)  The format for your next appointment:   In Person  Provider:   Bryan Lemma, MD    Other Instructions      Marykay Lex, MD, MS Bryan Lemma, M.D., M.S. Interventional Cardiologist  [facility]  Pager # [phone] Phone # [phone] [address] [place], [place] [address]   Thank you for choosing [facility] at  [place]!!

## 2022-06-06 ENCOUNTER — Encounter: Payer: Self-pay | Admitting: Cardiology

## 2022-06-06 ENCOUNTER — Other Ambulatory Visit: Payer: Self-pay

## 2022-06-06 NOTE — Assessment & Plan Note (Addendum)
Echo suggested that the [facility] aorta was 46 mm.  CT scan suggested that it was 4.2 x 4.2.  Not unexpected with aortic stenosis. Reassess CTA chest-aorta in January 2025 along with echocardiogram for correlation but also progression of disease.  This would determine whether or not she would need simple AVR versus AVR with aortic replacement.  Closely monitor blood pressure.  She is on amlodipine and carvedilol.  No longer on ACE inhibitor. Continue statin plus Lovaza. Continue glycemic control with glipizide. ->  Probably needs closer glycemic control consider GLP-1 agonist (depends on cost)

## 2022-06-06 NOTE — Assessment & Plan Note (Signed)
Most recent A1c went up to 8.0-had previously been 7.2.  She is only on glipizide.  I suspect that she may need more aggressive management with either GLP-1 agonist plus or minus SGLT2 inhibitor.  For lipids she is on Lovaza and low-dose rosuvastatin.  LDL is pretty well-controlled.  Triglycerides mildly elevated.  Likely related to glycemic control.

## 2022-06-06 NOTE — Assessment & Plan Note (Signed)
Recommended that he stays active with diet control and exercise.  This was less dressed on her valve and is protected from a cardiovascular standpoint with ascending aortic dilation.  Consider GLP-1 agonist.  But also discussed importance of dietary modification and staying active with exercise.  Probably needs to do more than just her work level exertion.

## 2022-06-06 NOTE — Assessment & Plan Note (Signed)
Progression of stenosis with mean gradient increased from 27 up to 32 mmHg.  Symptoms discussed, she remains asymptomatic.  Will plan to reassess Echo along with CTA Chest-Aorta January 2025 to assess cardiac valves and ascending thoracic aorta.

## 2022-06-29 ENCOUNTER — Other Ambulatory Visit: Payer: Self-pay

## 2022-06-29 ENCOUNTER — Other Ambulatory Visit: Payer: Self-pay | Admitting: Nurse Practitioner

## 2022-06-29 DIAGNOSIS — I10 Essential (primary) hypertension: Secondary | ICD-10-CM

## 2022-06-29 NOTE — Telephone Encounter (Signed)
Requested by interface surescripts. Medication dose discontinued 06/01/22.  Requested Prescriptions  Refused Prescriptions Disp Refills   lisinopril (ZESTRIL) 20 MG tablet 90 tablet 2    Sig: Take 1 tablet (20 mg total) by mouth daily.     Cardiovascular:  ACE Inhibitors Passed - 06/29/2022  3:59 PM      Passed - Cr in normal range and within 180 days    Creat  Date Value Ref Range Status  03/01/2016 0.81 0.50 - 1.05 mg/dL Final    Comment:      For patients > or = 65 years of age: The upper reference limit for Creatinine is approximately 13% higher for people identified as African-American.      Creatinine, Ser  Date Value Ref Range Status  04/11/2022 0.62 0.57 - 1.00 mg/dL Final   Creatinine, Urine  Date Value Ref Range Status  03/01/2016 251 20 - 320 mg/dL Final         Passed - K in normal range and within 180 days    Potassium  Date Value Ref Range Status  04/11/2022 4.7 3.5 - 5.2 mmol/L Final         Passed - Patient is not pregnant      Passed - Last BP in normal range    BP Readings from Last 1 Encounters:  06/01/22 122/76         Passed - Valid encounter within last 6 months    Recent Outpatient Visits           2 months ago Uncontrolled type 2 diabetes mellitus with hyperglycemia, without long-term current use of insulin [facility])   [facility] [place], Shea Stakes, NP   4 months ago Primary hypertension   [facility] [place], Shea Stakes, NP   5 months ago Essential hypertension   [facility] [place], Cornelius Moras, RPH-CPP   8 months ago Primary hypertension   [facility] [place], Cornelius Moras, RPH-CPP   9 months ago Primary hypertension   [facility] [place], Shea Stakes, NP       Future Appointments             In 2 weeks Claiborne Rigg, NP [facility]

## 2022-06-30 ENCOUNTER — Other Ambulatory Visit: Payer: Self-pay

## 2022-07-04 ENCOUNTER — Other Ambulatory Visit: Payer: Self-pay

## 2022-07-06 ENCOUNTER — Other Ambulatory Visit: Payer: Self-pay

## 2022-07-19 ENCOUNTER — Telehealth: Payer: Self-pay | Admitting: Cardiology

## 2022-07-19 ENCOUNTER — Encounter: Payer: Self-pay | Admitting: Nurse Practitioner

## 2022-07-19 ENCOUNTER — Ambulatory Visit: Payer: Self-pay | Attending: Nurse Practitioner | Admitting: Nurse Practitioner

## 2022-07-19 ENCOUNTER — Other Ambulatory Visit: Payer: Self-pay

## 2022-07-19 VITALS — BP 151/80 | HR 77 | Ht 60.0 in | Wt 183.8 lb

## 2022-07-19 DIAGNOSIS — E1165 Type 2 diabetes mellitus with hyperglycemia: Secondary | ICD-10-CM

## 2022-07-19 DIAGNOSIS — Z7984 Long term (current) use of oral hypoglycemic drugs: Secondary | ICD-10-CM

## 2022-07-19 DIAGNOSIS — I10 Essential (primary) hypertension: Secondary | ICD-10-CM

## 2022-07-19 LAB — POCT GLYCOSYLATED HEMOGLOBIN (HGB A1C): Hemoglobin A1C: 8.2 % — AB (ref 4.0–5.6)

## 2022-07-19 MED ORDER — LOSARTAN POTASSIUM 25 MG PO TABS
25.0000 mg | ORAL_TABLET | Freq: Every day | ORAL | 1 refills | Status: DC
Start: 2022-07-19 — End: 2022-07-28
  Filled 2022-07-19: qty 90, 90d supply, fill #0

## 2022-07-19 MED ORDER — METFORMIN HCL 1000 MG PO TABS
1000.0000 mg | ORAL_TABLET | Freq: Two times a day (BID) | ORAL | 2 refills | Status: DC
Start: 2022-07-19 — End: 2023-02-17
  Filled 2022-07-19: qty 180, 90d supply, fill #0
  Filled 2022-10-06: qty 60, 30d supply, fill #1
  Filled 2022-11-03: qty 60, 30d supply, fill #2
  Filled 2022-11-30: qty 60, 30d supply, fill #3
  Filled 2023-01-04: qty 60, 30d supply, fill #4
  Filled 2023-01-31: qty 60, 30d supply, fill #5

## 2022-07-19 MED ORDER — EMPAGLIFLOZIN 10 MG PO TABS
10.0000 mg | ORAL_TABLET | Freq: Every day | ORAL | 1 refills | Status: DC
Start: 2022-07-19 — End: 2022-11-15
  Filled 2022-07-19: qty 14, 14d supply, fill #0
  Filled 2022-07-22: qty 30, 30d supply, fill #1

## 2022-07-19 MED ORDER — LOSARTAN POTASSIUM 25 MG PO TABS
25.0000 mg | ORAL_TABLET | Freq: Every day | ORAL | 1 refills | Status: DC
Start: 2022-07-19 — End: 2022-07-19
  Filled 2022-07-19: qty 90, 90d supply, fill #0

## 2022-07-19 NOTE — Telephone Encounter (Signed)
[place] called from [facility] about patient's medication that Dr. Herbie Baltimore has prescribed. Please call back

## 2022-07-19 NOTE — Progress Notes (Signed)
Assessment & Plan:  Erin Huff was seen today for diabetes, hypertension and foot swelling.  Diagnoses and all orders for this visit:  Uncontrolled type 2 diabetes mellitus with hyperglycemia, without long-term current use of insulin  Not at goal (A1c) Added Jardiance Continue metformin and glipizide -     POCT glycosylated hemoglobin (Hb A1C) -     Microalbumin / creatinine urine ratio -     CMP14+EGFR -     empagliflozin (JARDIANCE) 10 MG TABS tablet; Take 1 tablet (10 mg total) by mouth daily before breakfast. For diabetes  Essential hypertension STOP AMLODIPINE -     losartan (COZAAR) 25 MG tablet; Take 1 tablet (25 mg total) by mouth daily. For blood pressure -     Basic metabolic panel; Future    Patient has been counseled on age-appropriate routine health concerns for screening and prevention. These are reviewed and up-to-date. Referrals have been placed accordingly. Immunizations are up-to-date or declined.    Subjective:   Chief Complaint  Patient presents with   Diabetes   Hypertension   Foot Swelling    Left and right foot.    HPI Erin Huff 67 y.o. female presents to office today for frollow up to DM and HTN.  She has a past medical history of Allergy, Depression, DM 2, GERD, Hyperlipidemia, Hypertension, Moderate to severe aortic stenosis (03/2011), Thoracic aortic aneurysm (HCC) (04/06/2021), and Thyroid disease.     DM Diabetes is not at goal. She seems to be unsure of what she is taking. There is note form Cardiology that she was only on glipizide however she reports taking metformin and glipizide today. Will add jardiance.  Lab Results  Component Value Date   HGBA1C 8.2 (A) 07/19/2022    Lab Results  Component Value Date   HGBA1C 8.0 (A) 04/11/2022      HTN Blood pressure is elevated today. Endorses pain in both legs as well associated BLE edema. She has been using warm salt water soaks to both feet which she states helps with the  swelling and pain. She does have moderate to sever aortic stenosis which could also be contributing to her current symptoms.  BP Readings from Last 3 Encounters:  07/19/22 (!) 151/80  06/01/22 122/76  04/11/22 130/78     Review of Systems  Constitutional:  Negative for fever, malaise/fatigue and weight loss.  HENT: Negative.  Negative for nosebleeds.   Eyes: Negative.  Negative for blurred vision, double vision and photophobia.  Respiratory: Negative.  Negative for cough and shortness of breath.   Cardiovascular:  Positive for leg swelling. Negative for chest pain and palpitations.  Gastrointestinal: Negative.  Negative for heartburn, nausea and vomiting.  Musculoskeletal: Negative.  Negative for myalgias.  Neurological: Negative.  Negative for dizziness, focal weakness, seizures and headaches.  Psychiatric/Behavioral: Negative.  Negative for suicidal ideas.     Past Medical History:  Diagnosis Date   Allergy    Depression    mild   Diabetes mellitus without complication (HCC)    GERD (gastroesophageal reflux disease)    Hyperlipidemia    Hypertension    Moderate to severe aortic stenosis 03/2011   Moderate to severe AS noted on echo with mean gradient 27 mmHg.  Also noted moderate dilation of the ascending aorta-46 mm.   Thoracic aortic aneurysm (HCC) 04/06/2021   Measuring 46 mm on Echo; CT angiogram chest-aorta 08/10/2021: 4.2 x 4.2 ascending aortic dilation.  Mildly enlarged heart.   Thyroid disease  Past Surgical History:  Procedure Laterality Date   NO PAST SURGERIES     TRANSTHORACIC ECHOCARDIOGRAM  04/05/2021   EF 60 to 65%.  GR 1 DD.  Normal RV size and mildly elevated RVSP.  Normal RAP.  Moderate to Severe Aortic Stenosis.  Mean gradient 27 mmHg.  Moderate dilation of the ascending aorta measuring 46 mm.  Recommend CTA    Family History  Problem Relation Age of Onset   Hypertension Sister    Diabetes Brother    Breast cancer Maternal Aunt    Colon cancer Neg  Hx    Colon polyps Neg Hx    Esophageal cancer Neg Hx    Rectal cancer Neg Hx    Stomach cancer Neg Hx     Social History Reviewed with no changes to be made today.   Outpatient Medications Prior to Visit  Medication Sig Dispense Refill   Blood Glucose Monitoring Suppl (TRUE METRIX METER) w/Device KIT Use as directed 1 kit 0   carvedilol (COREG) 6.25 MG tablet Take 1 tablet (6.25 mg total) by mouth 2 (two) times daily. 180 tablet 3   glipiZIDE (GLUCOTROL XL) 10 MG 24 hr tablet Take 2 tablets (20 mg total) by mouth daily with breakfast. 180 tablet 1   glucose blood (TRUE METRIX BLOOD GLUCOSE TEST) test strip USE AS INSTRUCTED. 100 strip 3   levothyroxine (SYNTHROID) 100 MCG tablet Take 1 tablet (100 mcg total) by mouth daily before breakfast. 90 tablet 2   loratadine (CLARITIN) 10 MG tablet Take 1 tablet (10 mg total) by mouth daily. 90 tablet 3   metFORMIN (GLUCOPHAGE) 1000 MG tablet Take 1 tablet (1,000 mg total) by mouth 2 (two) times daily with a meal. 180 tablet 2   omega-3 acid ethyl esters (LOVAZA) 1 g capsule Take 2 capsules (2 g total) by mouth 2 (two) times daily. 360 capsule 1   omeprazole (PRILOSEC) 20 MG capsule Take 1 capsule (20 mg total) by mouth daily as needed. PARA ACIDO 90 capsule 1   rosuvastatin (CRESTOR) 5 MG tablet Take 1 tablet (5 mg total) by mouth daily. 90 tablet 2   TRUEplus Lancets 28G MISC Use as directed 100 each 5   amLODipine (NORVASC) 10 MG tablet Take 1 tablet (10 mg total) by mouth daily. 90 tablet 1   Vitamin D, Ergocalciferol, (DRISDOL) 1.25 MG (50000 UNIT) CAPS capsule Take 1 capsule (50,000 Units total) by mouth every 7 (seven) days. (Patient not taking: Reported on 07/19/2022) 12 capsule 0   Facility-Administered Medications Prior to Visit  Medication Dose Route Frequency Provider Last Rate Last Admin   0.9 %  sodium chloride infusion  500 mL Intravenous Continuous Pyrtle, Carie Caddy, MD        Allergies  Allergen Reactions   Motrin [Ibuprofen] Rash    Penicillins Rash       Objective:    BP (!) 151/80 (BP Location: Left Arm, Patient Position: Sitting, Cuff Size: Normal)   Pulse 77   Ht 5' (1.524 m)   Wt 183 lb 12.8 oz (83.4 kg)   SpO2 98%   BMI 35.90 kg/m  Wt Readings from Last 3 Encounters:  07/19/22 183 lb 12.8 oz (83.4 kg)  06/01/22 179 lb 9.6 oz (81.5 kg)  04/11/22 181 lb 9.6 oz (82.4 kg)    Physical Exam Vitals and nursing note reviewed.  Constitutional:      Appearance: She is well-developed.  HENT:     Head: Normocephalic and atraumatic.  Cardiovascular:  Rate and Rhythm: Normal rate and regular rhythm.     Heart sounds: Normal heart sounds. No murmur heard.    No friction rub. No gallop.  Pulmonary:     Effort: Pulmonary effort is normal. No tachypnea or respiratory distress.     Breath sounds: Normal breath sounds. No decreased breath sounds, wheezing, rhonchi or rales.  Chest:     Chest wall: No tenderness.  Abdominal:     General: Bowel sounds are normal.     Palpations: Abdomen is soft.  Musculoskeletal:        General: Normal range of motion.     Cervical back: Normal range of motion.  Skin:    General: Skin is warm and dry.  Neurological:     Mental Status: She is alert and oriented to person, place, and time.     Coordination: Coordination normal.  Psychiatric:        Behavior: Behavior normal. Behavior is cooperative.        Thought Content: Thought content normal.        Judgment: Judgment normal.          Patient has been counseled extensively about nutrition and exercise as well as the importance of adherence with medications and regular follow-up. The patient was given clear instructions to go to ER or return to medical center if symptoms don't improve, worsen or new problems develop. The patient verbalized understanding.   Follow-up: Return in about 4 weeks (around 08/16/2022) for BP recheck with me or nurse.   Claiborne Rigg, FNP-BC [facility]  [place], [place] [phone]   07/19/2022, 4:59 PM

## 2022-07-19 NOTE — Telephone Encounter (Signed)
Left a message with call back number.  I do not see where Dr Herbie Baltimore has recently prescribed anything, but the patient was prescribed some new medications at her PCP visit today with [name], NP.

## 2022-07-19 NOTE — Progress Notes (Unsigned)
Feet swelling 

## 2022-07-20 LAB — CMP14+EGFR
ALT: 15 IU/L (ref 0–32)
AST: 21 IU/L (ref 0–40)
Albumin/Globulin Ratio: 1.5 (ref 1.2–2.2)
Albumin: 4.4 g/dL (ref 3.9–4.9)
Alkaline Phosphatase: 122 IU/L — ABNORMAL HIGH (ref 44–121)
BUN/Creatinine Ratio: 18 (ref 12–28)
BUN: 15 mg/dL (ref 8–27)
Bilirubin Total: 0.3 mg/dL (ref 0.0–1.2)
CO2: 20 mmol/L (ref 20–29)
Calcium: 9.5 mg/dL (ref 8.7–10.3)
Chloride: 100 mmol/L (ref 96–106)
Creatinine, Ser: 0.84 mg/dL (ref 0.57–1.00)
Globulin, Total: 2.9 g/dL (ref 1.5–4.5)
Glucose: 223 mg/dL — ABNORMAL HIGH (ref 70–99)
Potassium: 4.8 mmol/L (ref 3.5–5.2)
Sodium: 134 mmol/L (ref 134–144)
Total Protein: 7.3 g/dL (ref 6.0–8.5)
eGFR: 77 mL/min/{1.73_m2} (ref >59)

## 2022-07-20 NOTE — Telephone Encounter (Signed)
LVM to call our office.

## 2022-07-20 NOTE — Progress Notes (Signed)
Opened in error. Scheduled patient for annual [facility]CP appointment.

## 2022-07-21 ENCOUNTER — Encounter: Payer: Self-pay | Admitting: Nurse Practitioner

## 2022-07-21 NOTE — Telephone Encounter (Signed)
Third attempt.  LVM that we have attempted to contact.  Also noted the PCP had been the one to prescribe new medication.  Any further questions, please call our office

## 2022-07-22 ENCOUNTER — Ambulatory Visit: Payer: Self-pay | Admitting: *Deleted

## 2022-07-22 ENCOUNTER — Other Ambulatory Visit: Payer: Self-pay

## 2022-07-22 NOTE — Telephone Encounter (Addendum)
  Chief Complaint: BP elevated and having a headache since starting 2 new medications.  (Had to look in chart to see which medications were new as she did not know).   Just one was for BP and the other for diabetes.   The medications were Jardiance 10 mg and Losartan 25 mg. Symptoms: BP elevated 160/85 this morning and having headaches.  BP elevated and headaches since starting 2 new medications.  Saw [name], NP on 07/19/2022. Frequency: Daily since started new medications. Pertinent Negatives: Patient denies N/A Disposition: [] ED /[] Urgent Care (no appt availability in office) / [] Appointment(In office/virtual)/ []  [facility]/ [] Home Care/ [] Refused Recommended Disposition /[] [facility]/ [x]  Follow-up with PCP Additional Notes: Message sent to [facility] for [name], NP letting her know about these side effects.   Pt. Agreeable to someone calling her back.   She did not take the 2 new medications this morning.    Will need Spanish interpreter.  Spanish interpreter was [place] [phone]

## 2022-07-22 NOTE — Telephone Encounter (Signed)
Reason for Disposition  [1] Taking BP medications AND [2] feels is having side effects (e.g., impotence, cough, dizzy upon standing)  Answer Assessment - Initial Assessment Questions 1. BLOOD PRESSURE: "What is the blood pressure?" "Did you take at least two measurements 5 minutes apart?"     Calling in with Spanish interpreter.  160/85 2. ONSET: "When did you take your blood pressure?"     Today  I had an appt on Tues. And it was high.    I have a headache.   I was prescribed 2 new medications.   I don't know what I should do.   I think my BP and headache are from the new medications. One for diabetes and 1 for BP.   Both are new medications. 3. HOW: "How did you take your blood pressure?" (e.g., automatic home BP monitor, visiting nurse)     I've been checking my BP.     4. HISTORY: "Do you have a history of high blood pressure?"     Yes 5. MEDICINES: "Are you taking any medicines for blood pressure?" "Have you missed any doses recently?"     Yes   One new BP medication was added at my appt.   6. OTHER SYMPTOMS: "Do you have any symptoms?" (e.g., blurred vision, chest pain, difficulty breathing, headache, weakness)     Headache.    I did not take the 2 new medications this morning because of how I am feeling. 7. PREGNANCY: "Is there any chance you are pregnant?" "When was your last menstrual period?"     N/A due to age  Protocols used: Blood Pressure - High-A-AH

## 2022-07-22 NOTE — Telephone Encounter (Signed)
Call placed to patient unable to reach message left on VM.(Interpreter [phone])  Message left to advise patient to make the following changes to medication per Pharmacist   - Hold medications today. She did not take this morning. - Restart tomorrow but take the Jardiance in the morning and losartan 30 minutes before bedtime. - Try to give the medications 1 full week of use to see if the headaches improve. - She can call us back next Friday to let us know if the headaches improve.

## 2022-07-23 ENCOUNTER — Emergency Department (HOSPITAL_COMMUNITY)
Admission: EM | Admit: 2022-07-23 | Discharge: 2022-07-23 | Disposition: A | Discharge status: Home / Self Care | Payer: Self-pay | Attending: Emergency Medicine | Admitting: Emergency Medicine

## 2022-07-23 ENCOUNTER — Other Ambulatory Visit: Payer: Self-pay

## 2022-07-23 ENCOUNTER — Encounter (HOSPITAL_COMMUNITY): Payer: Self-pay | Admitting: Emergency Medicine

## 2022-07-23 ENCOUNTER — Emergency Department (HOSPITAL_COMMUNITY): Payer: Self-pay

## 2022-07-23 DIAGNOSIS — E1165 Type 2 diabetes mellitus with hyperglycemia: Secondary | ICD-10-CM | POA: Insufficient documentation

## 2022-07-23 DIAGNOSIS — E039 Hypothyroidism, unspecified: Secondary | ICD-10-CM | POA: Insufficient documentation

## 2022-07-23 DIAGNOSIS — Z7984 Long term (current) use of oral hypoglycemic drugs: Secondary | ICD-10-CM | POA: Insufficient documentation

## 2022-07-23 DIAGNOSIS — Z79899 Other long term (current) drug therapy: Secondary | ICD-10-CM | POA: Insufficient documentation

## 2022-07-23 DIAGNOSIS — I10 Essential (primary) hypertension: Secondary | ICD-10-CM | POA: Insufficient documentation

## 2022-07-23 LAB — CBC
HCT: 37.3 % (ref 36.0–46.0)
Hemoglobin: 12.4 g/dL (ref 12.0–15.0)
MCH: 29 pg (ref 26.0–34.0)
MCHC: 33.2 g/dL (ref 30.0–36.0)
MCV: 87.1 fL (ref 80.0–100.0)
Platelets: 227 10*3/uL (ref 150–400)
RBC: 4.28 MIL/uL (ref 3.87–5.11)
RDW: 12.9 % (ref 11.5–15.5)
WBC: 12.3 10*3/uL — ABNORMAL HIGH (ref 4.0–10.5)
nRBC: 0 % (ref 0.0–0.2)

## 2022-07-23 LAB — URINALYSIS, ROUTINE W REFLEX MICROSCOPIC
Bacteria, UA: NONE SEEN
Bilirubin Urine: NEGATIVE
Glucose, UA: >=500 mg/dL — AB
Ketones, ur: NEGATIVE mg/dL
Nitrite: NEGATIVE
Protein, ur: NEGATIVE mg/dL
Specific Gravity, Urine: 1.013 (ref 1.005–1.030)
pH: 7 (ref 5.0–8.0)

## 2022-07-23 LAB — CBG MONITORING, ED: Glucose-Capillary: 192 mg/dL — ABNORMAL HIGH (ref 70–99)

## 2022-07-23 LAB — BASIC METABOLIC PANEL
Anion gap: 10 (ref 5–15)
BUN: 15 mg/dL (ref 8–23)
CO2: 22 mmol/L (ref 22–32)
Calcium: 9.4 mg/dL (ref 8.9–10.3)
Chloride: 101 mmol/L (ref 98–111)
Creatinine, Ser: 0.8 mg/dL (ref 0.44–1.00)
GFR, Estimated: >60 mL/min (ref >60)
Glucose, Bld: 193 mg/dL — ABNORMAL HIGH (ref 70–99)
Potassium: 4.1 mmol/L (ref 3.5–5.1)
Sodium: 133 mmol/L — ABNORMAL LOW (ref 135–145)

## 2022-07-23 NOTE — ED Notes (Signed)
Pt states that at home she checked her BP and BG and noted that it was elevated. Pt not currently having any symptoms

## 2022-07-23 NOTE — ED Provider Notes (Signed)
[place] EMERGENCY DEPARTMENT AT [facility] Provider Note   CSN: [id number] Arrival date & time: 07/23/22  4098     History  Chief Complaint  Patient presents with   Hypertension   Hyperglycemia    Erin Huff is a 67 y.o. female history of moderate-severe aortic stenosis, ascending aortic aneurysm 4.2 x 4.2 cm, type 2 diabetes, hypothyroidism presented with hypertension and hyperglycemia since last night.  Patient was recently placed on Jardiance and Cozaar 4 days ago.  Patient was previously on lisinopril.  Since then patient states her blood pressure has been in the 150s 160s and her sugars around the 140s and 150s.  Patient states this is high for her but only endorses a headache from yesterday that went away after a few moments.  Patient was unable to describe the headache but states that she did not take her meds yesterday as she does not like how they make her feel.  Patient denied chest pain, shortness of breath, vision changes, neck pain, left arm pain, left jaw pain, nausea/vomiting, dysuria, changes sensation/motor skills, LOC, fever  Spanish translator [phone]  Home Medications Prior to Admission medications   Medication Sig Start Date End Date Taking? Authorizing Provider  Blood Glucose Monitoring Suppl (TRUE METRIX METER) w/Device KIT Use as directed 06/14/16   Quentin Angst, MD  carvedilol (COREG) 6.25 MG tablet Take 1 tablet (6.25 mg total) by mouth 2 (two) times daily. 02/16/22   Claiborne Rigg, NP  empagliflozin (JARDIANCE) 10 MG TABS tablet Take 1 tablet (10 mg total) by mouth daily before breakfast. For diabetes 07/19/22   Claiborne Rigg, NP  glipiZIDE (GLUCOTROL XL) 10 MG 24 hr tablet Take 2 tablets (20 mg total) by mouth daily with breakfast. 04/11/22   Claiborne Rigg, NP  glucose blood (TRUE METRIX BLOOD GLUCOSE TEST) test strip USE AS INSTRUCTED. 09/17/21 09/17/22  Hoy Register, MD  levothyroxine (SYNTHROID) 100 MCG tablet  Take 1 tablet (100 mcg total) by mouth daily before breakfast. 04/11/22   Claiborne Rigg, NP  loratadine (CLARITIN) 10 MG tablet Take 1 tablet (10 mg total) by mouth daily. 07/29/20   Claiborne Rigg, NP  losartan (COZAAR) 25 MG tablet Take 1 tablet (25 mg total) by mouth daily. For blood pressure 07/19/22   Claiborne Rigg, NP  metFORMIN (GLUCOPHAGE) 1000 MG tablet Take 1 tablet (1,000 mg total) by mouth 2 (two) times daily with a meal. 07/19/22   Claiborne Rigg, NP  omega-3 acid ethyl esters (LOVAZA) 1 g capsule Take 2 capsules (2 g total) by mouth 2 (two) times daily. 02/16/22   Claiborne Rigg, NP  omeprazole (PRILOSEC) 20 MG capsule Take 1 capsule (20 mg total) by mouth daily as needed. PARA ACIDO 04/11/22   Claiborne Rigg, NP  rosuvastatin (CRESTOR) 5 MG tablet Take 1 tablet (5 mg total) by mouth daily. 02/16/22   Claiborne Rigg, NP  TRUEplus Lancets 28G MISC Use as directed 09/17/21   Hoy Register, MD  Vitamin D, Ergocalciferol, (DRISDOL) 1.25 MG (50000 UNIT) CAPS capsule Take 1 capsule (50,000 Units total) by mouth every 7 (seven) days. Patient not taking: Reported on 07/19/2022 01/24/22   Hoy Register, MD      Allergies    Motrin [ibuprofen] and Penicillins    Review of Systems   Review of Systems See HPI Physical Exam Updated Vital Signs BP (!) 163/73   Pulse 85   Temp 98 F (36.7 C)  Resp 15   Wt 83.4 kg   SpO2 100%   BMI 35.90 kg/m  Physical Exam Vitals reviewed.  Constitutional:      General: She is not in acute distress. HENT:     Head: Normocephalic and atraumatic.  Eyes:     Extraocular Movements: Extraocular movements intact.     Conjunctiva/sclera: Conjunctivae normal.     Pupils: Pupils are equal, round, and reactive to light.  Cardiovascular:     Rate and Rhythm: Normal rate and regular rhythm.     Pulses: Normal pulses.     Heart sounds: Murmur (3 out of 6 aortic stenosis) heard.     Comments: 2+ bilateral radial/dorsalis pedis pulses with  regular rate Pulmonary:     Effort: Pulmonary effort is normal. No respiratory distress.     Breath sounds: Normal breath sounds.  Abdominal:     Palpations: Abdomen is soft.     Tenderness: There is no abdominal tenderness. There is no guarding or rebound.  Musculoskeletal:        General: Normal range of motion.     Cervical back: Normal range of motion and neck supple.     Comments: 5 out of 5 bilateral grip/leg extension strength  Skin:    General: Skin is warm and dry.     Capillary Refill: Capillary refill takes less than 2 seconds.  Neurological:     General: No focal deficit present.     Mental Status: She is alert and oriented to person, place, and time.     Comments: Sensation intact in all 4 limbs  Psychiatric:        Mood and Affect: Mood normal.     ED Results / Procedures / Treatments   Labs (all labs ordered are listed, but only abnormal results are displayed) Labs Reviewed  BASIC METABOLIC PANEL - Abnormal; Notable for the following components:      Result Value   Sodium 133 (*)    Glucose, Bld 193 (*)    All other components within normal limits  CBC - Abnormal; Notable for the following components:   WBC 12.3 (*)    All other components within normal limits  URINALYSIS, ROUTINE W REFLEX MICROSCOPIC - Abnormal; Notable for the following components:   Glucose, UA >=500 (*)    Hgb urine dipstick SMALL (*)    Leukocytes,Ua SMALL (*)    All other components within normal limits  CBG MONITORING, ED - Abnormal; Notable for the following components:   Glucose-Capillary 192 (*)    All other components within normal limits    EKG None  Radiology CT Head Wo Contrast  Result Date: 07/23/2022 CLINICAL DATA:  67 year old female with history of headache. EXAM: CT HEAD WITHOUT CONTRAST TECHNIQUE: Contiguous axial images were obtained from the base of the skull through the vertex without intravenous contrast. RADIATION DOSE REDUCTION: This exam was performed  according to the departmental dose-optimization program which includes automated exposure control, adjustment of the mA and/or kV according to patient size and/or use of iterative reconstruction technique. COMPARISON:  No priors. FINDINGS: Brain: Physiologic calcifications are noted in the basal ganglia bilaterally. No evidence of acute infarction, hemorrhage, hydrocephalus, extra-axial collection or mass lesion/mass effect. Vascular: No hyperdense vessel or unexpected calcification. Skull: Normal. Negative for fracture or focal lesion. Sinuses/Orbits: No acute finding. Other: None. IMPRESSION: 1. No acute intracranial abnormalities to account for the patient's symptoms. Electronically Signed   By: Trudie Reed M.D.   On: 07/23/2022 08:05  Procedures Procedures    Medications Ordered in ED Medications - No data to display  ED Course/ Medical Decision Making/ A&P                             Medical Decision Making  Erin Huff 67 y.o. presented today for hyperglycemia, hypertension, headache. Working DDx that I considered at this time includes, but not limited to, DKA/HHS, hyperglycemic state, hypertensive emergency/urgency, SAH, epidural/subdural hematoma, migraine, tension headache.  R/o DDx: DKA/HHS, hyperglycemic state, hypertensive emergency/urgency, SAH, epidural/subdural hematoma, migraine, tension headache: These are considered less likely due to history of present illness and physical exam findings  Review of prior external notes: 07/20/2022 progress Notes  Unique Tests and My Interpretation:  CBG: 92, BMP: Unremarkable CBC: Unremarkable UA: Over 500 glucose CT head: No acute intracranial abnormalities  Discussion with Independent Historian:  Daughter  Discussion of Management of Tests: None  Risk: Low: based on diagnostic testing/clinical impression and treatment plan  Risk Stratification Score: None  Staffed with Anitra Lauth, MD  Plan: Patient  presented for hyperglycemia, hypertension, headache. On exam patient was in no acute distress and stable vitals.  Patient blood pressure was 163/73 in ED and sugar was 192 from triage.  Patient's physical exam was unremarkable including a neuro exam.  Aortic stenosis murmur auscultated on exam has been documented previously.  Patient stated that she had a headache yesterday but was unable to further clarify this headache and so a CT scan was ordered which was negative.  Patient does note yesterday she did not take her blood pressure meds or Jardiance and so I highly suspect patient's headache was related to medication noncompliance.  Patient has not endorsing any neurosymptoms and her neuroexam was reassuring.  Patient's labs are also reassuring.  Patient will be discharged with primary care follow-up and strongly encouraged to continue taking her medications as prescribed.  I spoke to the patient about the dangers of having high blood pressure and high sugar with her medical conditions and patient verbalized understanding.  Patient was given return precautions. Patient stable for discharge at this time.  Patient verbalized understanding of plan.         Final Clinical Impression(s) / ED Diagnoses Final diagnoses:  Hypertension, unspecified type    Rx / DC Orders ED Discharge Orders     None         Remi Deter 07/23/22 0845    Gwyneth Sprout, MD 07/23/22 2051

## 2022-07-23 NOTE — Discharge Instructions (Signed)
Hoy sus laboratorios e imgenes fueron todos tranquilizadores.  Su presin arterial y sus niveles de glucosa tambin fueron tranquilizadores.  Haga un seguimiento con su proveedor de atencin primaria sobre la visita reciente a la sala de emergencias y los sntomas, y para que se reevalen sus medicamentos.  Contine con los medicamentos segn lo recetado, ya que tiene antecedentes mdicos importantes y si su presin arterial y sus niveles de azcar no se controlan, esto podra Tour manager.  Si los sntomas empeoran o [place], regrese a Sports administrator.  Esta traduccin se realiz con [employer] Translate y se hicieron todos los intentos para Physiological scientist.

## 2022-07-23 NOTE — ED Triage Notes (Signed)
Pt in with HTN and hyperglycemia at home, reports CBG 150 last night. Denies any cp, does have a HA. BP 163/73 in triage

## 2022-07-25 ENCOUNTER — Telehealth: Payer: Self-pay

## 2022-07-25 ENCOUNTER — Other Ambulatory Visit: Payer: Self-pay

## 2022-07-25 NOTE — Telephone Encounter (Signed)
Patient came in stating the medication Losartan gave her side effects stating that she felt bad and her blood pressure went up to where she had to go to the ED on 6/1. Patient is wondering if possible to lower her dose or if she can get another prescription.

## 2022-07-26 NOTE — Telephone Encounter (Signed)
Unable to reach patient by patient.

## 2022-07-26 NOTE — Telephone Encounter (Signed)
This is a very low dose of blood pressure medication. This was not what caused her blood pressure to go up. I would like for her to stay on this medication for 2 weeks and we will see her for nurse visit on 08-16-2022

## 2022-07-27 ENCOUNTER — Ambulatory Visit: Payer: Self-pay

## 2022-07-27 ENCOUNTER — Other Ambulatory Visit: Payer: Self-pay

## 2022-07-27 NOTE — Telephone Encounter (Signed)
Message from Mayer sent at 07/27/2022 12:47 PM EDT  Summary: medication reaction   Pt called stated she is feeling bad when she take the losartan (COZAAR) 25 MG tablet. She is afraid to continue taking it if it will continue to make her feel bad. Will need spanish interpreter when calling back         Chief Complaint: concerned Losartan is causing her to feel bad Symptoms: headache and stomachache Frequency: started on Losartan 07/23/22 Disposition: [] ED /[] Urgent Care (no appt availability in office) / [x] Appointment(In office/virtual)/ []  [facility]/ [] Home Care/ [] Refused Recommended Disposition /[] [facility]/ []  Follow-up with PCP Additional Notes: made hospital f/u appt for 07/28/22  Reason for Disposition  [1] Caller has medicine question about med NOT prescribed by PCP AND [2] triager unable to answer question (e.g., compatibility with other med, storage)  Answer Assessment - Initial Assessment Questions 1. NAME of MEDICINE: "What medicine(s) are you calling about?"     *No Answer* 2. QUESTION: "What is your question?" (e.g., double dose of medicine, side effect)     Side effect 3. PRESCRIBER: "Who prescribed the medicine?" Reason: if prescribed by specialist, call should be referred to that group.     PCP 4. SYMPTOMS: "Do you have any symptoms?" If Yes, ask: "What symptoms are you having?"  "How bad are the symptoms (e.g., mild, moderate, severe)     Lisinopril  5. PREGNANCY:  "Is there any chance that you are pregnant?" "When was your last menstrual period?"     N/a  Protocols used: Medication Question Call-A-AH

## 2022-07-28 ENCOUNTER — Other Ambulatory Visit: Payer: Self-pay

## 2022-07-28 ENCOUNTER — Ambulatory Visit: Payer: Self-pay | Attending: Family Medicine | Admitting: Family Medicine

## 2022-07-28 ENCOUNTER — Encounter: Payer: Self-pay | Admitting: Family Medicine

## 2022-07-28 VITALS — BP 158/81 | HR 69 | Ht 60.0 in | Wt 176.2 lb

## 2022-07-28 DIAGNOSIS — I10 Essential (primary) hypertension: Secondary | ICD-10-CM

## 2022-07-28 MED ORDER — SPIRONOLACTONE 25 MG PO TABS
25.0000 mg | ORAL_TABLET | Freq: Every day | ORAL | 3 refills | Status: DC
Start: 2022-07-28 — End: 2022-11-15
  Filled 2022-07-28: qty 90, 90d supply, fill #0
  Filled 2022-10-18: qty 30, 30d supply, fill #1

## 2022-07-28 NOTE — Progress Notes (Signed)
Subjective:  Patient ID: Erin Huff, female    DOB: 1956/01/18  Age: 67 y.o. MRN: [id number]  CC: Hypertension   HPI Erin Huff is a 67 y.o. year old female with a history of hypothyroidism, type 2 diabetes mellitus, hypertension, aortic stenosis.  Interval History:  She Complains of headaches and abdominal pain which occurred after Losartan was initiated 5 days ago. Previously on Amlodipine but she Complained of pedal edema. BP at home was 142/72 but it is elevated today. She took her Coreg but not Losartan. Past Medical History:  Diagnosis Date   Allergy    Depression    mild   Diabetes mellitus without complication (HCC)    GERD (gastroesophageal reflux disease)    Hyperlipidemia    Hypertension    Moderate to severe aortic stenosis 03/2011   Moderate to severe AS noted on echo with mean gradient 27 mmHg.  Also noted moderate dilation of the ascending aorta-46 mm.   Thoracic aortic aneurysm (HCC) 04/06/2021   Measuring 46 mm on Echo; CT angiogram chest-aorta 08/10/2021: 4.2 x 4.2 ascending aortic dilation.  Mildly enlarged heart.   Thyroid disease     Past Surgical History:  Procedure Laterality Date   NO PAST SURGERIES     TRANSTHORACIC ECHOCARDIOGRAM  04/05/2021   EF 60 to 65%.  GR 1 DD.  Normal RV size and mildly elevated RVSP.  Normal RAP.  Moderate to Severe Aortic Stenosis.  Mean gradient 27 mmHg.  Moderate dilation of the ascending aorta measuring 46 mm.  Recommend CTA    Family History  Problem Relation Age of Onset   Hypertension Sister    Diabetes Brother    Breast cancer Maternal Aunt    Colon cancer Neg Hx    Colon polyps Neg Hx    Esophageal cancer Neg Hx    Rectal cancer Neg Hx    Stomach cancer Neg Hx     Social History   Socioeconomic History   Marital status: Married    Spouse name: Not on file   Number of children: 5   Years of education: Not on file   Highest education level: 2nd grade  Occupational History    Not on file  Tobacco Use   Smoking status: Never   Smokeless tobacco: Never  Vaping Use   Vaping Use: Never used  Substance and Sexual Activity   Alcohol use: No    Comment: rare    Drug use: No   Sexual activity: Yes    Birth control/protection: None  Other Topics Concern   Not on file  Social History Narrative   Sometimes husband is aggressive but she talks back and is aggressive with him. Does not take it.       Native of [place], but has been living in [place] area for 18+ years.  Speaks Spanish only.      Usually active, enjoys doing Zumba exercise   Social Determinants of Health   Financial Resource Strain: Medium Risk (06/02/2022)   Overall Financial Resource Strain (CARDIA)    Difficulty of Paying Living Expenses: Somewhat hard  Food Insecurity: No Food Insecurity (06/02/2022)   Hunger Vital Sign    Worried About Running Out of Food in the Last Year: Never true    Ran Out of Food in the Last Year: Never true  Transportation Needs: Unmet Transportation Needs (09/02/2021)   PRAPARE - Administrator, Civil Service (Medical): Yes    Lack of Transportation (Non-Medical): Yes  Physical Activity: Insufficiently Active (05/09/2017)   Exercise Vital Sign    Days of Exercise per Week: 2 days    Minutes of Exercise per Session: 30 min  Stress: No Stress Concern Present (05/09/2017)   [employer] of Occupational Health - Occupational Stress Questionnaire    Feeling of Stress : Not at all  Social Connections: Moderately Integrated (05/09/2017)   Social Connection and Isolation Panel [NHANES]    Frequency of Communication with Friends and Family: Three times a week    Frequency of Social Gatherings with Friends and Family: Once a week    Attends Religious Services: More than 4 times per year    Active Member of [employer] or Organizations: No    Attends Engineer, structural: Never    Marital Status: Married    Allergies  Allergen Reactions   Motrin  [Ibuprofen] Rash   Penicillins Rash    Outpatient Medications Prior to Visit  Medication Sig Dispense Refill   Blood Glucose Monitoring Suppl (TRUE METRIX METER) w/Device KIT Use as directed 1 kit 0   carvedilol (COREG) 6.25 MG tablet Take 1 tablet (6.25 mg total) by mouth 2 (two) times daily. 180 tablet 3   empagliflozin (JARDIANCE) 10 MG TABS tablet Take 1 tablet (10 mg total) by mouth daily before breakfast. For diabetes 90 tablet 1   glipiZIDE (GLUCOTROL XL) 10 MG 24 hr tablet Take 2 tablets (20 mg total) by mouth daily with breakfast. 180 tablet 1   glucose blood (TRUE METRIX BLOOD GLUCOSE TEST) test strip USE AS INSTRUCTED. 100 strip 3   levothyroxine (SYNTHROID) 100 MCG tablet Take 1 tablet (100 mcg total) by mouth daily before breakfast. 90 tablet 2   loratadine (CLARITIN) 10 MG tablet Take 1 tablet (10 mg total) by mouth daily. 90 tablet 3   metFORMIN (GLUCOPHAGE) 1000 MG tablet Take 1 tablet (1,000 mg total) by mouth 2 (two) times daily with a meal. 180 tablet 2   omega-3 acid ethyl esters (LOVAZA) 1 g capsule Take 2 capsules (2 g total) by mouth 2 (two) times daily. 360 capsule 1   omeprazole (PRILOSEC) 20 MG capsule Take 1 capsule (20 mg total) by mouth daily as needed. PARA ACIDO 90 capsule 1   rosuvastatin (CRESTOR) 5 MG tablet Take 1 tablet (5 mg total) by mouth daily. 90 tablet 2   TRUEplus Lancets 28G MISC Use as directed 100 each 5   Vitamin D, Ergocalciferol, (DRISDOL) 1.25 MG (50000 UNIT) CAPS capsule Take 1 capsule (50,000 Units total) by mouth every 7 (seven) days. 12 capsule 0   losartan (COZAAR) 25 MG tablet Take 1 tablet (25 mg total) by mouth daily. For blood pressure 90 tablet 1   Facility-Administered Medications Prior to Visit  Medication Dose Route Frequency Provider Last Rate Last Admin   0.9 %  sodium chloride infusion  500 mL Intravenous Continuous Pyrtle, Carie Caddy, MD         ROS Review of Systems  Constitutional:  Negative for activity change and appetite  change.  HENT:  Negative for sinus pressure and sore throat.   Respiratory:  Negative for chest tightness, shortness of breath and wheezing.   Cardiovascular:  Negative for chest pain and palpitations.  Gastrointestinal:  Positive for abdominal pain. Negative for abdominal distention and constipation.  Genitourinary: Negative.   Musculoskeletal: Negative.   Neurological:  Positive for headaches.  Psychiatric/Behavioral:  Negative for behavioral problems and dysphoric mood.     Objective:  BP (!) 158/81  Pulse 69   Ht 5' (1.524 m)   Wt 176 lb 3.2 oz (79.9 kg)   SpO2 98%   BMI 34.41 kg/m      07/28/2022    9:42 AM 07/23/2022    5:47 AM 07/23/2022    5:44 AM  BP/Weight  Systolic BP 158  161  Diastolic BP 81  73  Wt. (Lbs) 176.2 183.8   BMI 34.41 kg/m2 35.9 kg/m2       Physical Exam Constitutional:      Appearance: She is well-developed.  Cardiovascular:     Rate and Rhythm: Normal rate.     Heart sounds: Murmur heard.  Pulmonary:     Effort: Pulmonary effort is normal.     Breath sounds: Normal breath sounds. No wheezing or rales.  Chest:     Chest wall: No tenderness.  Abdominal:     General: Bowel sounds are normal. There is no distension.     Palpations: Abdomen is soft. There is no mass.     Tenderness: There is no abdominal tenderness.  Musculoskeletal:        General: Normal range of motion.     Right lower leg: No edema.     Left lower leg: No edema.  Neurological:     Mental Status: She is alert and oriented to person, place, and time.  Psychiatric:        Mood and Affect: Mood normal.        Latest Ref Rng & Units 07/23/2022    5:54 AM 07/19/2022    5:07 PM 04/11/2022    5:07 PM  CMP  Glucose 70 - 99 mg/dL 096  045  409   BUN 8 - 23 mg/dL 15  15  9    Creatinine 0.44 - 1.00 mg/dL 8.11  9.14  7.82   Sodium 135 - 145 mmol/L 133  134  135   Potassium 3.5 - 5.1 mmol/L 4.1  4.8  4.7   Chloride 98 - 111 mmol/L 101  100  103   CO2 22 - 32 mmol/L 22  20  17     Calcium 8.9 - 10.3 mg/dL 9.4  9.5  9.6   Total Protein 6.0 - 8.5 g/dL  7.3  7.2   Total Bilirubin 0.0 - 1.2 mg/dL  0.3  0.3   Alkaline Phos 44 - 121 IU/L  122  120   AST 0 - 40 IU/L  21  19   ALT 0 - 32 IU/L  15  14     Lipid Panel     Component Value Date/Time   CHOL 141 12/16/2021 1528   TRIG 158 (H) 12/16/2021 1528   HDL 63 12/16/2021 1528   CHOLHDL 2.2 12/16/2021 1528   CHOLHDL 3.7 03/01/2016 0956   VLDL 47 (H) 03/01/2016 0956   LDLCALC 52 12/16/2021 1528    CBC    Component Value Date/Time   WBC 12.3 (H) 07/23/2022 0554   RBC 4.28 07/23/2022 0554   HGB 12.4 07/23/2022 0554   HGB 12.7 09/10/2021 1613   HCT 37.3 07/23/2022 0554   HCT 36.9 09/10/2021 1613   PLT 227 07/23/2022 0554   PLT 228 09/10/2021 1613   MCV 87.1 07/23/2022 0554   MCV 86 09/10/2021 1613   MCH 29.0 07/23/2022 0554   MCHC 33.2 07/23/2022 0554   RDW 12.9 07/23/2022 0554   RDW 12.3 09/10/2021 1613   LYMPHSABS 2.7 07/30/2020 1642   MONOABS 0.4 07/11/2015 0301   EOSABS  0.2 07/30/2020 1642   BASOSABS 0.1 07/30/2020 1642    Lab Results  Component Value Date   HGBA1C 8.2 (A) 07/19/2022    Assessment & Plan:  1. Essential hypertension Controlled Complains of intolerance to losartan Will substitute losartan with spironolactone Continue Coreg Will check potassium in 2 weeks given spironolactone was initiated Keep upcoming appointment for blood pressure recheck later in the month Counseled on blood pressure goal of less than 130/80, low-sodium, DASH diet, medication compliance, 150 minutes of moderate intensity exercise per week. Discussed medication compliance, adverse effects. - spironolactone (ALDACTONE) 25 MG tablet; Take 1 tablet (25 mg total) by mouth daily.  Dispense: 30 tablet; Refill: 3 - Potassium; Future    Meds ordered this encounter  Medications   spironolactone (ALDACTONE) 25 MG tablet    Sig: Take 1 tablet (25 mg total) by mouth daily.    Dispense:  30 tablet    Refill:   3    Follow-up: Return for previously scheduled appointment.       Hoy Register, MD, FAAFP. [facility] [place], [place] [phone]   07/28/2022, 10:04 AM

## 2022-07-28 NOTE — Patient Instructions (Signed)
Cmo controlar su hipertensin Managing Your Hypertension La hipertensin, tambin conocida como presin arterial alta, se produce cuando la sangre ejerce presin contra las paredes de las arterias con demasiada fuerza. Las arterias son los vasos sanguneos que transportan la sangre desde el corazn hacia todas las partes del cuerpo. La hipertensin hace que el corazn haga ms esfuerzo para bombear la sangre y puede provocar que las arterias se estrechen o endurezcan. Qu significan las lecturas de la presin arterial Una lectura de la presin arterial consta de un nmero ms alto sobre un nmero ms bajo. El primer nmero, o nmero superior, es la presin sistlica. Es la medida de la presin de las arterias cuando el corazn late. El segundo nmero, o nmero inferior, es la presin diastlica. Es la medida de la presin en las arterias cuando el corazn se relaja. Para la mayora de las personas, una presin arterial normal est por debajo de 120/80. La presin arterial deseada puede variar en funcin de las enfermedades, la edad y otros factores personales. La presin arterial se clasifica en cuatro etapas. Sobre la base de la lectura de su presin arterial, el mdico puede usar las siguientes etapas para determinar si necesita tratamiento y de qu tipo. La presin sistlica y la presin diastlica se miden en una unidad llamada milmetros de mercurio (mm Hg). Normal Presin sistlica: por debajo de 120. Presin diastlica: por debajo de 80. Elevada Presin sistlica: 120-129. Presin diastlica: por debajo de 80. Etapa 1 de hipertensin Presin sistlica: 130-139. Presin diastlica: 80-89. Etapa 2 de hipertensin Presin sistlica: 140 o ms. Presin diastlica: 90 o ms. Cmo puede afectarme esta enfermedad? Controlar la hipertensin es muy importante. Con el transcurso del tiempo, la hipertensin puede daar las arterias y disminuir el flujo de sangre hacia partes del cuerpo que  incluyen el cerebro, el corazn y los riones. Tener hipertensin no tratada o no controlada puede causar: Infarto de miocardio. Accidente cerebrovascular. Debilitamiento de los vasos sanguneos (aneurisma). Insuficiencia cardaca. Dao renal. Dao ocular. Problemas de memoria y concentracin. Demencia vascular. Qu medidas puedo tomar para controlar esta afeccin? La hipertensin se puede controlar haciendo cambios en el estilo de vida y, posiblemente, tomando medicamentos. Su mdico le ayudar a crear un plan para bajar la presin arterial al rango normal. Es posible que lo deriven para que reciba asesoramiento sobre una dieta saludable y actividad fsica. Nutricin  Siga una dieta con alto contenido de fibras y potasio, y con bajo contenido de sal (sodio), azcar agregada y grasas. Un ejemplo de plan de alimentacin se denomina dieta DASH. DASH es la sigla en ingls de "Enfoques alimentarios para detener la hipertensin". Para alimentarse de esta manera: Coma mucha fruta y verdura fresca. Trate de que la mitad del plato de cada comida sea de frutas y verduras. Coma cereales integrales, como pasta integral, arroz integral o pan integral. Llene aproximadamente un cuarto del plato con cereales integrales. Consuma productos lcteos descremados. Evite la ingesta de cortes de carne grasa, carne procesada o curada, y carne de ave con piel. Llene aproximadamente un cuarto del plato con protenas magras, como pescado, pollo sin piel, frijoles, huevos o tofu. Evite ingerir alimentos prehechos y procesados. En general, estos tienen mayor cantidad de sodio, azcar agregada y grasa. Reduzca su ingesta diaria de sodio. Muchas personas que tienen hipertensin deben comer menos de 1500 mg de sodio por da. Estilo de vida  Trabaje con su mdico para mantener un peso saludable o perder peso. Pregntele cul es el peso recomendado   para usted. Realice al menos 30 minutos de ejercicio que haga que se acelere  su corazn (ejercicio aerbico) la mayora de los das de la semana. Estas actividades pueden incluir caminar, nadar o andar en bicicleta. Incluya ejercicios para fortalecer sus msculos (ejercicios de resistencia), como levantamiento de pesas, como parte de su rutina semanal de ejercicios. Intente realizar 30 minutos de este tipo de ejercicios al menos tres das a la semana. No consuma ningn producto que contenga nicotina o tabaco. Estos productos incluyen cigarrillos, tabaco para mascar y aparatos de vapeo, como los cigarrillos electrnicos. Si necesita ayuda para dejar de consumir estos productos, consulte al mdico. Controle las enfermedades a largo plazo (crnicas), como el colesterol alto o la diabetes. Identifique sus causas de estrs y encuentre maneras de controlar el estrs. Esto puede incluir meditacin, respiracin profunda o hacerse tiempo para realizar actividades divertidas. Consumo de alcohol No beba alcohol si: Su mdico le indica no hacerlo. Est embarazada, puede estar embarazada o est tratando de quedar embarazada. Si bebe alcohol: Limite la cantidad que bebe a lo siguiente: De 0 a 1 medida por da para las mujeres. De 0 a 2 medidas por da para los hombres. Sepa cunta cantidad de alcohol hay en las bebidas que toma. En los [place], una medida equivale a una botella de cerveza de 12 oz (355 ml), un vaso de vino de 5 oz (148 ml) o un vaso de una bebida alcohlica de alta graduacin de 1 oz (44 ml). Medicamentos El mdico puede recetarle medicamentos si los cambios en el estilo de vida no son suficientes para lograr controlar la presin arterial y si: Su presin arterial sistlica es de 130 o ms. Su presin arterial diastlica es de 80 o ms. Use los medicamentos solamente como se lo haya indicado el mdico. Siga cuidadosamente las indicaciones. Los medicamentos para la presin arterial deben tomarse como se lo haya indicado el mdico. Los medicamentos pierden eficacia  al omitir las dosis. El hecho de omitir las dosis tambin aumenta el riesgo de otros problemas. Monitoreo Antes de controlarse la presin arterial: No fume, no consuma bebidas con cafena ni haga ejercicio dentro de los 30 minutos antes de tomar la medicin. Vaya al bao y vace la vejiga (orine). Permanezca sentado tranquilamente durante al menos 5 minutos antes de tomar las mediciones. Contrlese la presin arterial en su casa segn las indicaciones del mdico. Para hacer esto: Sintese con la espalda recta y con apoyo. Coloque los pies planos en el piso. No se cruce de piernas. Apoye el brazo sobre una superficie plana, como una mesa. Asegrese de que la parte superior del brazo est al nivel del corazn. Cada vez que tome una medicin, tome dos o tres lecturas con un minuto de separacin y anote los resultados. Posiblemente tambin sea necesario que el mdico le controle la presin arterial de manera regular. Informacin general Hable con su mdico acerca de la dieta, hbitos de ejercicio y otros factores del estilo de vida que pueden contribuir a la hipertensin. Revise con su mdico todos los medicamentos que toma ya que puede haber efectos secundarios o interacciones. Concurra a todas las visitas de seguimiento. El mdico puede ayudarle a crear y ajustar su plan para controlar la presin arterial alta. Dnde obtener ms informacin National Heart, Lung, and Blood Institute (Instituto Nacional del Corazn, los Pulmones y la Sangre): www.nhlbi.nih.gov American Heart Association (Asociacin Estadounidense del Corazn): www.heart.org Comunquese con un mdico si: Piensa que tiene una reaccin alrgica a los medicamentos   que ha tomado. Tiene dolores de cabeza frecuentes (recurrentes). Siente mareos. Tiene hinchazn en los tobillos. Tiene problemas de visin. Solicite ayuda de inmediato si: Siente un dolor de cabeza intenso o confusin. Siente debilidad inusual, adormecimiento o que se  desmayar. Siente un dolor intenso en el pecho o el abdomen. Vomita repetidas veces. Tiene dificultad para respirar. Estos sntomas pueden indicar una emergencia. Solicite ayuda de inmediato. Llame al 911. No espere a ver si los sntomas desaparecen. No conduzca por sus propios medios hasta el hospital. Resumen La hipertensin se produce cuando la sangre bombea en las arterias con mucha fuerza. Si esta afeccin no se controla, podra correr riesgo de tener complicaciones graves. La presin arterial deseada puede variar en funcin de las enfermedades, la edad y otros factores personales. Para la mayora de las personas, una presin arterial normal es menor que 120/80. La hipertensin se puede controlar mediante cambios en el estilo de vida, tomando medicamentos, o ambas cosas. Los cambios en el estilo de vida para ayudar a controlar la hipertensin incluyen prdida de peso, seguir una dieta saludable, con bajo contenido de sodio, hacer ms ejercicio, dejar de fumar y limitar el consumo de alcohol. Esta informacin no tiene como fin reemplazar el consejo del mdico. Asegrese de hacerle al mdico cualquier pregunta que tenga. Document Revised: 11/16/2020 Document Reviewed: 11/16/2020 Elsevier Patient Education  2024 Elsevier Inc.  

## 2022-07-28 NOTE — Progress Notes (Signed)
Headache/abdominal pain after taking Losartan.

## 2022-07-29 LAB — POTASSIUM: Potassium: 4.5 mmol/L (ref 3.5–5.2)

## 2022-08-02 ENCOUNTER — Ambulatory Visit: Payer: Self-pay | Attending: Family Medicine

## 2022-08-09 ENCOUNTER — Ambulatory Visit (INDEPENDENT_AMBULATORY_CARE_PROVIDER_SITE_OTHER): Payer: Self-pay | Admitting: Dermatology

## 2022-08-09 VITALS — BP 183/118 | HR 73

## 2022-08-09 DIAGNOSIS — L300 Nummular dermatitis: Secondary | ICD-10-CM

## 2022-08-09 DIAGNOSIS — L309 Dermatitis, unspecified: Secondary | ICD-10-CM

## 2022-08-09 MED ORDER — CLOBETASOL PROPIONATE 0.05 % EX CREA: 1.0000 | TOPICAL_CREAM | Freq: Two times a day (BID) | CUTANEOUS | 1 refills | Status: DC

## 2022-08-09 NOTE — Progress Notes (Signed)
   New Patient Visit   Subjective  Erin Huff is a 67 y.o. female who presents for the following: Rash  Patient states she has rash located at the lower legs that she would like to have examined. Patient reports the areas have been there for  2  week(s). She reports the areas are bothersome. She states that the areas have spread. Patient reports has previously been treated for these areas with triamcinolone ointment (KENALOG) 0.5 %; Apply 1 Application topically 2 (two) times daily. Patient reports the medication did not help. Patient denies Hx of bx. Patient denies family history of skin cancer(s).    The following portions of the chart were reviewed this encounter and updated as appropriate: medications, allergies, medical history  Review of Systems:  No other skin or systemic complaints except as noted in HPI or Assessment and Plan.  Objective  Well appearing patient in no apparent distress; mood and affect are within normal limits.  A focused examination was performed of the following areas: Arms And legs  Relevant exam findings are noted in the Assessment and Plan.   Assessment & Plan   Nummular dermatitis Exam: Well controlled   Treatment Plan: - Do not use HOT water, Use Warm water -Try using Joan Mayans and/or Caress body washes to prevent drying out the skin -Towel Dry and apply moisturizer immediately to help skin absorb the lotion  - Clobetasol Cream to use for 2 times a day for up to 2 weeks when flares STOP AFTER 2 weeks  No follow-ups on file.  ***  Documentation: I have reviewed the above documentation for accuracy and completeness, and I agree with the above.  Stasia Cavalier, am acting as scribe for Langston Reusing, DO.  Langston Reusing, DO

## 2022-08-09 NOTE — Patient Instructions (Addendum)
- Do not use HOT water, Use Warm water -Try using Joan Mayans and/or Caress body washes to prevent drying out the skin -Towel Dry and apply moisturizer immediately to help skin absorb the lotion  - Clobetasol Cream to use for 2 times a day for up to 2 weeks when flares STOP AFTER 2 weeks  Due to recent changes in healthcare laws, you may see results of your pathology and/or laboratory studies on MyChart before the doctors have had a chance to review them. We understand that in some cases there may be results that are confusing or concerning to you. Please understand that not all results are received at the same time and often the doctors may need to interpret multiple results in order to provide you with the best plan of care or course of treatment. Therefore, we ask that you please give [place] 2 business days to thoroughly review all your results before contacting the office for clarification. Should we see a critical lab result, you will be contacted sooner.   If You Need Anything After Your Visit  If you have any questions or concerns for your doctor, please call our main line at [phone] If no one answers, please leave a voicemail as directed and we will return your call as soon as possible. Messages left after 4 pm will be answered the following business day.   You may also send [place] a message via MyChart. We typically respond to MyChart messages within 1-2 business days.  For prescription refills, please ask your pharmacy to contact our office. Our fax number is [phone].  If you have an urgent issue when the clinic is closed that cannot wait until the next business day, you can page your doctor at the number below.    Please note that while we do our best to be available for urgent issues outside of office hours, we are not available 24/7.   If you have an urgent issue and are unable to reach [place], you may choose to seek medical care at your doctor's office, retail clinic, urgent care  center, or emergency room.  If you have a medical emergency, please immediately call 911 or go to the emergency department. In the event of inclement weather, please call our main line at [phone] for an update on the status of any delays or closures.  Dermatology Medication Tips: Please keep the boxes that topical medications come in in order to help keep track of the instructions about where and how to use these. Pharmacies typically print the medication instructions only on the boxes and not directly on the medication tubes.   If your medication is too expensive, please contact our office at [phone] or send [place] a message through MyChart.   We are unable to tell what your co-pay for medications will be in advance as this is different depending on your insurance coverage. However, we may be able to find a substitute medication at lower cost or fill out paperwork to get insurance to cover a needed medication.   If a prior authorization is required to get your medication covered by your insurance company, please allow [place] 1-2 business days to complete this process.  Drug prices often vary depending on where the prescription is filled and some pharmacies may offer cheaper prices.  The website www.goodrx.com contains coupons for medications through different pharmacies. The prices here do not account for what the cost may be with help from insurance (it may be cheaper with your insurance),  but the website can give you the price if you did not use any insurance.  - You can print the associated coupon and take it with your prescription to the pharmacy.  - You may also stop by our office during regular business hours and pick up a GoodRx coupon card.  - If you need your prescription sent electronically to a different pharmacy, notify our office through [facility] or by phone at [phone]

## 2022-08-10 ENCOUNTER — Other Ambulatory Visit: Payer: Self-pay

## 2022-08-11 ENCOUNTER — Other Ambulatory Visit: Payer: Self-pay

## 2022-08-15 ENCOUNTER — Other Ambulatory Visit: Payer: Self-pay

## 2022-08-16 ENCOUNTER — Other Ambulatory Visit: Payer: Self-pay | Admitting: Nurse Practitioner

## 2022-08-16 ENCOUNTER — Ambulatory Visit: Payer: Self-pay | Attending: Nurse Practitioner

## 2022-08-16 VITALS — BP 148/72 | HR 68

## 2022-08-16 DIAGNOSIS — I10 Essential (primary) hypertension: Secondary | ICD-10-CM

## 2022-08-16 NOTE — Progress Notes (Signed)
   Blood Pressure Recheck Visit  Name: Erin Huff MRN: [id number] Date of Birth: [birth year]  [name] presents today for Blood Pressure recheck with clinical support staff.  Order for BP recheck by Elsie Lincoln, RN, ordered on 08/16/2022.   BP Readings from Last 3 Encounters:  08/16/22 (!) 148/72  08/09/22 (!) 183/118  07/28/22 (!) 158/81    Current Outpatient Medications  Medication Sig Dispense Refill   Blood Glucose Monitoring Suppl (TRUE METRIX METER) w/Device KIT Use as directed 1 kit 0   carvedilol (COREG) 6.25 MG tablet Take 1 tablet (6.25 mg total) by mouth 2 (two) times daily. 180 tablet 3   clobetasol cream (TEMOVATE) 0.05 % Apply 1 Application topically 2 (two) times daily. USE FOR 2 WEEKS THEN STOP 60 g 1   empagliflozin (JARDIANCE) 10 MG TABS tablet Take 1 tablet (10 mg total) by mouth daily before breakfast. For diabetes 90 tablet 1   glipiZIDE (GLUCOTROL XL) 10 MG 24 hr tablet Take 2 tablets (20 mg total) by mouth daily with breakfast. 180 tablet 1   glucose blood (TRUE METRIX BLOOD GLUCOSE TEST) test strip USE AS INSTRUCTED. 100 strip 3   levothyroxine (SYNTHROID) 100 MCG tablet Take 1 tablet (100 mcg total) by mouth daily before breakfast. 90 tablet 2   loratadine (CLARITIN) 10 MG tablet Take 1 tablet (10 mg total) by mouth daily. 90 tablet 3   metFORMIN (GLUCOPHAGE) 1000 MG tablet Take 1 tablet (1,000 mg total) by mouth 2 (two) times daily with a meal. 180 tablet 2   omega-3 acid ethyl esters (LOVAZA) 1 g capsule Take 2 capsules (2 g total) by mouth 2 (two) times daily. 360 capsule 1   omeprazole (PRILOSEC) 20 MG capsule Take 1 capsule (20 mg total) by mouth daily as needed. PARA ACIDO 90 capsule 1   rosuvastatin (CRESTOR) 5 MG tablet Take 1 tablet (5 mg total) by mouth daily. 90 tablet 2   spironolactone (ALDACTONE) 25 MG tablet Take 1 tablet (25 mg total) by mouth daily. 30 tablet 3   TRUEplus Lancets 28G MISC Use as directed 100  each 5   Vitamin D, Ergocalciferol, (DRISDOL) 1.25 MG (50000 UNIT) CAPS capsule Take 1 capsule (50,000 Units total) by mouth every 7 (seven) days. 12 capsule 0   Current Facility-Administered Medications  Medication Dose Route Frequency Provider Last Rate Last Admin   0.9 %  sodium chloride infusion  500 mL Intravenous Continuous Pyrtle, Carie Caddy, MD        Hypertensive Medication Review: Patient states that they are taking all their hypertensive medications as prescribed and their last dose of hypertensive medications was this morning   Documentation of any medication adherence discrepancies: none  Provider Recommendation:  Spoke to [name], NP and they stated:   If blood pressure greater than 140/90 and HR >60 increase carvedilol to 12.5 mg BID. F/U BP in 2-3 weeks with Franky Macho or Nurse   Patient has been scheduled to follow up with 2-3 weeks    Via Interpreter [phone]  Patient has been given provider's recommendations and doses not have any questions or concerns at this time. Patient will contact the office for any future questions or concerns.

## 2022-08-17 ENCOUNTER — Other Ambulatory Visit: Payer: Self-pay

## 2022-08-22 ENCOUNTER — Ambulatory Visit: Payer: Self-pay

## 2022-08-22 NOTE — Telephone Encounter (Signed)
  Chief Complaint: Medication questions.  Symptoms: Questions about what medications she should be taking Frequency: now Pertinent Negatives: Patient denies  Disposition: [] ED /[] Urgent Care (no appt availability in office) / [] Appointment(In office/virtual)/ []  [facility] [place] [facility]/ [] Home Care/ [] Refused Recommended Disposition /[] [facility]/ [x]  Follow-up with PCP Additional Notes: Pt is taking all 3 medications prescribed for diabetes. She also asked about cholesterol medication. Pt has 2 medications prescribed for cholesterol and she is taking both.  No further questions.   Summary: medication question   Patient called with Spanish interpreter [phone] as she wants know why she was prescribed empagliflozin (JARDIANCE) 10 MG TABS tablet and how she needs to take it. Please contact patient using interpreter      Uncontrolled type 2 diabetes mellitus with hyperglycemia, without long-term current use of insulin  Not at goal (A1c) Added Jardiance Continue metformin and glipizide -     POCT glycosylated hemoglobin (Hb A1C) -     Microalbumin / creatinine urine ratio -     CMP14+EGFR -     empagliflozin (JARDIANCE) 10 MG TABS tablet; Take 1 tablet (10 mg total) by mouth daily before breakfast. For diabetes Reason for Disposition  Caller has medicine question only, adult not sick, AND triager answers question  Answer Assessment - Initial Assessment Questions 1. NAME of MEDICINE: "What medicine(s) are you calling about?"     Jardiance 2. QUESTION: "What is your question?" (e.g., double dose of medicine, side effect)     Pt wanted to know why she was taking this medication, and actually was not sure if she was taking it. 3. PRESCRIBER: "Who prescribed the medicine?" Reason: if prescribed by specialist, call should be referred to that group.     [name]  Protocols used: Medication Question Call-A-AH

## 2022-08-26 ENCOUNTER — Other Ambulatory Visit: Payer: Self-pay

## 2022-08-30 ENCOUNTER — Other Ambulatory Visit: Payer: Self-pay

## 2022-08-30 ENCOUNTER — Ambulatory Visit: Payer: Self-pay | Attending: Nurse Practitioner

## 2022-08-30 DIAGNOSIS — I10 Essential (primary) hypertension: Secondary | ICD-10-CM

## 2022-08-30 MED ORDER — CARVEDILOL 6.25 MG PO TABS
12.5000 mg | ORAL_TABLET | Freq: Two times a day (BID) | ORAL | 3 refills | Status: DC
  Filled 2022-08-30 – 2022-09-02 (×2): qty 180, 45d supply, fill #0
  Filled 2022-10-18: qty 180, 45d supply, fill #1
  Filled 2022-11-30: qty 180, 45d supply, fill #2
  Filled 2023-01-31: qty 180, 45d supply, fill #3

## 2022-08-30 NOTE — Progress Notes (Signed)
   Blood Pressure Recheck Visit  Name: Erin Huff MRN: [id number] Date of Birth: 03-26-1955  Genowefa Luis-Altamirano presents today for Blood Pressure recheck with clinical support staff.  Order for BP recheck by Bunnie Philips  ordered on 08/30/2022.   BP Readings from Last 3 Encounters:  08/30/22 134/84  08/16/22 (!) 148/72  08/09/22 (!) 183/118    Current Outpatient Medications  Medication Sig Dispense Refill   Blood Glucose Monitoring Suppl (TRUE METRIX METER) w/Device KIT Use as directed 1 kit 0   carvedilol (COREG) 6.25 MG tablet Take 1 tablet (6.25 mg total) by mouth 2 (two) times daily. 180 tablet 3   clobetasol cream (TEMOVATE) 0.05 % Apply 1 Application topically 2 (two) times daily. USE FOR 2 WEEKS THEN STOP 60 g 1   empagliflozin (JARDIANCE) 10 MG TABS tablet Take 1 tablet (10 mg total) by mouth daily before breakfast. For diabetes 90 tablet 1   glipiZIDE (GLUCOTROL XL) 10 MG 24 hr tablet Take 2 tablets (20 mg total) by mouth daily with breakfast. 180 tablet 1   glucose blood (TRUE METRIX BLOOD GLUCOSE TEST) test strip USE AS INSTRUCTED. 100 strip 3   levothyroxine (SYNTHROID) 100 MCG tablet Take 1 tablet (100 mcg total) by mouth daily before breakfast. 90 tablet 2   loratadine (CLARITIN) 10 MG tablet Take 1 tablet (10 mg total) by mouth daily. 90 tablet 3   metFORMIN (GLUCOPHAGE) 1000 MG tablet Take 1 tablet (1,000 mg total) by mouth 2 (two) times daily with a meal. 180 tablet 2   omega-3 acid ethyl esters (LOVAZA) 1 g capsule Take 2 capsules (2 g total) by mouth 2 (two) times daily. 360 capsule 1   omeprazole (PRILOSEC) 20 MG capsule Take 1 capsule (20 mg total) by mouth daily as needed. PARA ACIDO 90 capsule 1   rosuvastatin (CRESTOR) 5 MG tablet Take 1 tablet (5 mg total) by mouth daily. 90 tablet 2   spironolactone (ALDACTONE) 25 MG tablet Take 1 tablet (25 mg total) by mouth daily. 30 tablet 3   TRUEplus Lancets 28G MISC Use as directed 100 each 5    Vitamin D, Ergocalciferol, (DRISDOL) 1.25 MG (50000 UNIT) CAPS capsule Take 1 capsule (50,000 Units total) by mouth every 7 (seven) days. 12 capsule 0   Current Facility-Administered Medications  Medication Dose Route Frequency Provider Last Rate Last Admin   0.9 %  sodium chloride infusion  500 mL Intravenous Continuous Pyrtle, Carie Caddy, MD        Hypertensive Medication Review: Patient states that they are taking all their hypertensive medications as prescribed and their last dose of hypertensive medications was yesterday   Documentation of any medication adherence discrepancies: none  Provider Recommendation:  Spoke to [name] , NP  and they stated: carvedilol to 12.5 mg BID.   Patient has been scheduled to follow up with 3 months    Patient has been given provider's recommendations and does not have any questions or concerns at this time. Patient will contact the office for any future questions or concerns.

## 2022-08-31 ENCOUNTER — Other Ambulatory Visit: Payer: Self-pay

## 2022-09-02 ENCOUNTER — Other Ambulatory Visit: Payer: Self-pay

## 2022-09-29 ENCOUNTER — Ambulatory Visit: Payer: Self-pay

## 2022-10-05 ENCOUNTER — Encounter (HOSPITAL_COMMUNITY): Payer: Self-pay | Admitting: *Deleted

## 2022-10-05 ENCOUNTER — Ambulatory Visit (HOSPITAL_COMMUNITY)
Admission: EM | Admit: 2022-10-05 | Discharge: 2022-10-05 | Disposition: A | Discharge status: Home / Self Care | Payer: Self-pay | Attending: Physician Assistant | Admitting: Physician Assistant

## 2022-10-05 DIAGNOSIS — R519 Headache, unspecified: Secondary | ICD-10-CM | POA: Insufficient documentation

## 2022-10-05 DIAGNOSIS — R030 Elevated blood-pressure reading, without diagnosis of hypertension: Secondary | ICD-10-CM | POA: Insufficient documentation

## 2022-10-05 LAB — SEDIMENTATION RATE: Sed Rate: 1 mm/hr (ref 0–22)

## 2022-10-05 LAB — COMPREHENSIVE METABOLIC PANEL
ALT: 15 U/L (ref 0–44)
AST: 19 U/L (ref 15–41)
Albumin: 3.6 g/dL (ref 3.5–5.0)
Alkaline Phosphatase: 88 U/L (ref 38–126)
Anion gap: 9 (ref 5–15)
BUN: 11 mg/dL (ref 8–23)
CO2: 21 mmol/L — ABNORMAL LOW (ref 22–32)
Calcium: 8.8 mg/dL — ABNORMAL LOW (ref 8.9–10.3)
Chloride: 102 mmol/L (ref 98–111)
Creatinine, Ser: 0.76 mg/dL (ref 0.44–1.00)
GFR, Estimated: >60 mL/min (ref >60)
Glucose, Bld: 171 mg/dL — ABNORMAL HIGH (ref 70–99)
Potassium: 4.2 mmol/L (ref 3.5–5.1)
Sodium: 132 mmol/L — ABNORMAL LOW (ref 135–145)
Total Bilirubin: 0.6 mg/dL (ref 0.3–1.2)
Total Protein: 8 g/dL (ref 6.5–8.1)

## 2022-10-05 LAB — CBC WITH DIFFERENTIAL/PLATELET
Abs Immature Granulocytes: 0.03 10*3/uL (ref 0.00–0.07)
Basophils Absolute: 0 10*3/uL (ref 0.0–0.1)
Basophils Relative: 1 %
Eosinophils Absolute: 0.3 10*3/uL (ref 0.0–0.5)
Eosinophils Relative: 4 %
HCT: 40.2 % (ref 36.0–46.0)
Hemoglobin: 12.8 g/dL (ref 12.0–15.0)
Immature Granulocytes: 0 %
Lymphocytes Relative: 27 %
Lymphs Abs: 2 10*3/uL (ref 0.7–4.0)
MCH: 27.4 pg (ref 26.0–34.0)
MCHC: 31.8 g/dL (ref 30.0–36.0)
MCV: 86.1 fL (ref 80.0–100.0)
Monocytes Absolute: 0.5 10*3/uL (ref 0.1–1.0)
Monocytes Relative: 7 %
Neutro Abs: 4.6 10*3/uL (ref 1.7–7.7)
Neutrophils Relative %: 61 %
Platelets: 242 10*3/uL (ref 150–400)
RBC: 4.67 MIL/uL (ref 3.87–5.11)
RDW: 13.1 % (ref 11.5–15.5)
WBC: 7.5 10*3/uL (ref 4.0–10.5)
nRBC: 0 % (ref 0.0–0.2)

## 2022-10-05 LAB — C-REACTIVE PROTEIN: CRP: 0.8 mg/dL (ref <1.0)

## 2022-10-05 MED ORDER — BACLOFEN 5 MG PO TABS: 5.0000 mg | ORAL_TABLET | Freq: Every evening | ORAL | 0 refills | Status: DC | PRN

## 2022-10-05 NOTE — Discharge Instructions (Addendum)
Your exam is reassuring.  I will contact you if any of your blood work is abnormal.  Take baclofen at night to help with the headache.  This will make you sleepy so do not drive or drink alcohol while taking it.  As we discussed, if your blood pressure is elevated and you have a headache it is reasonable to go to the emergency room.  If your headache changes and worsens in any way or if they develop any dizziness, feeling unsteady, vision change, weakness in 1 part of your body, trouble speaking, nausea, vomiting, worse headache of your life you need to go to the emergency room immediately.  Please follow-up with your primary care within a few days and as we discussed if anything worsens go to the ER.  Tu examen es tranquilizador.  Me comunicar con usted si alguno de sus anlisis de sangre es anormal.  Tome baclofeno por la noche para aliviar el dolor de [place].  Esto le provocar sueo, as que no conduzca ni beba alcohol mientras lo est tomando.  Como comentamos, si tienes la presin arterial elevada y tienes dolor de cabeza es razonable acudir a urgencias.  Si su dolor de [place] y [place] o si desarrolla mareos, sensacin de inestabilidad, cambio de visin, debilidad en 1 parte de su cuerpo, dificultad para hablar, nuseas, vmitos, el peor dolor de cabeza de su vida, debe ir a la sala de emergencias. inmediatamente.  Haga un seguimiento con su atencin primaria dentro de [address] y, como comentamos, si algo empeora, vaya a la sala de Sports administrator.

## 2022-10-05 NOTE — ED Provider Notes (Signed)
[facility]    CSN: [id number] Arrival date & time: 10/05/22  1718      History   Chief Complaint Chief Complaint  Patient presents with   Headache   Numbness    HPI Erin Huff is a 66 y.o. female.   Patient presents today companied by her daughter.  She speaks Spanish and video interpreter is utilized during visit.  Reports a 5 to 7-day history of intermittent left-sided headache.  She reports that currently pain is rated 5 on a 0-10 pain scale, described as aching, no aggravating or alleviating factors identified.  She has tried Tylenol without improvement.  She denies history of headache disorder such as migraines but does occasionally get headaches and had a similar episode several years ago.  She reports that pain is primarily localized to the temporal region but does spread across her forehead.  She also has some pain whenever she chews but denies any visual disturbance.  She denies any history of temporal arteritis.  She denies any medication changes or head injury.  She denies any associated fever, nausea, vomiting, chest pain, shortness of breath.  She does have a slightly elevated blood pressure in clinic with a history of hypertension.  She reports compliance with her medication.  She does have some associated neck pain as well as numbness in her left hand.  She is right-handed.  Reports that the numbness is localized to her palm and is intermittent without identifiable trigger.  Does not spread into her fingers or follow any specific dermatomal pattern.    Past Medical History:  Diagnosis Date   Allergy    Depression    mild   Diabetes mellitus without complication (HCC)    GERD (gastroesophageal reflux disease)    Hyperlipidemia    Hypertension    Moderate to severe aortic stenosis 03/2011   Moderate to severe AS noted on echo with mean gradient 27 mmHg.  Also noted moderate dilation of the ascending aorta-46 mm.   Thoracic aortic aneurysm  (HCC) 04/06/2021   Measuring 46 mm on Echo; CT angiogram chest-aorta 08/10/2021: 4.2 x 4.2 ascending aortic dilation.  Mildly enlarged heart.   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  GR 1 DD.  Normal RV size and mildly elevated RVSP.  Normal RAP.  Moderate to Severe Aortic Stenosis.  Mean gradient 27 mmHg.  Moderate dilation of the ascending aorta measuring 46 mm.  Recommend CTA    OB History     Gravida  5   Para      Term      Preterm      AB      Living  5      SAB      IAB  Ectopic      Multiple      Live Births  5            Home Medications    Prior to Admission medications   Medication Sig Start Date End Date Taking? Authorizing Provider  Baclofen 5 MG TABS Take 1 tablet (5 mg total) by mouth at bedtime as needed. 10/05/22  Yes Alberto Pina K, PA-C  Blood Glucose Monitoring Suppl (TRUE METRIX METER) w/Device KIT Use as directed 06/14/16  Yes Jegede, Olugbemiga E, MD  carvedilol (COREG) 6.25 MG tablet Take 2 tablets (12.5 mg total) by mouth 2 (two) times daily. 08/30/22  Yes Claiborne Rigg, NP  empagliflozin (JARDIANCE) 10 MG TABS tablet Take 1 tablet (10 mg total) by mouth daily before breakfast. For diabetes 07/19/22  Yes  Claiborne Rigg, NP  glipiZIDE (GLUCOTROL XL) 10 MG 24 hr tablet Take 2 tablets (20 mg total) by mouth daily with breakfast. 04/11/22  Yes Claiborne Rigg, NP  levothyroxine (SYNTHROID) 100 MCG tablet Take 1 tablet (100 mcg total) by mouth daily before breakfast. 04/11/22  Yes Claiborne Rigg, NP  metFORMIN (GLUCOPHAGE) 1000 MG tablet Take 1 tablet (1,000 mg total) by mouth 2 (two) times daily with a meal. 07/19/22  Yes Claiborne Rigg, NP  rosuvastatin (CRESTOR) 5 MG tablet Take 1 tablet (5 mg total) by mouth daily. 02/16/22  Yes Claiborne Rigg, NP  spironolactone (ALDACTONE) 25 MG tablet Take 1 tablet (25 mg total) by mouth daily. 07/28/22  Yes Hoy Register, MD  TRUEplus Lancets 28G MISC Use as directed 09/17/21  Yes Newlin, Odette Horns, MD  clobetasol cream (TEMOVATE) 0.05 % Apply 1 Application topically 2 (two) times daily. USE FOR 2 WEEKS THEN STOP 08/09/22   Terri Piedra, DO  loratadine (CLARITIN) 10 MG tablet Take 1 tablet (10 mg total) by mouth daily. 07/29/20   Claiborne Rigg, NP    Family History Family History  Problem Relation Age of Onset   Hypertension Sister    Diabetes Brother    Breast cancer Maternal Aunt    Colon cancer Neg Hx    Colon polyps Neg Hx    Esophageal cancer Neg Hx    Rectal cancer Neg Hx    Stomach cancer Neg Hx     Social History Social History   Tobacco Use   Smoking status: Never   Smokeless tobacco: Never  Vaping Use   Vaping status: Never Used  Substance Use Topics   Alcohol use: No    Comment: rare    Drug use: No     Allergies   Motrin [ibuprofen] and Penicillins   Review of Systems Review of Systems  Constitutional:  Positive for activity change. Negative for appetite change, fatigue and fever.  Eyes:  Negative for photophobia and visual disturbance.  Respiratory:  Negative for cough and shortness of breath.   Cardiovascular:  Negative for chest pain, palpitations and leg swelling.  Gastrointestinal:  Negative for abdominal  pain, diarrhea, nausea and vomiting.  Musculoskeletal:  Positive for neck pain. Negative for arthralgias, back pain and myalgias.  Neurological:  Positive for numbness and headaches. Negative for dizziness, weakness and light-headedness.     Physical Exam Triage Vital Signs ED Triage Vitals  Encounter Vitals Group     BP 10/05/22 1838 (!) 169/81     Systolic BP Percentile --      Diastolic BP Percentile --      Pulse Rate 10/05/22 1838 61  Resp 10/05/22 1838 18     Temp 10/05/22 1838 98.1 F (36.7 C)     Temp Source 10/05/22 1838 Oral     SpO2 10/05/22 1838 97 %     Weight --      Height --      Head Circumference --      Peak Flow --      Pain Score 10/05/22 1835 5     Pain Loc --      Pain Education --      Exclude from Growth Chart --    No data found.  Updated Vital Signs BP (!) 164/79 (BP Location: Left Arm)   Pulse 61   Temp 98.1 F (36.7 C) (Oral)   Resp 18   SpO2 97%   Visual Acuity Right Eye Distance:   Left Eye Distance:   Bilateral Distance:    Right Eye Near:   Left Eye Near:    Bilateral Near:     Physical Exam Vitals reviewed.  Constitutional:      General: She is awake. She is not in acute distress.    Appearance: Normal appearance. She is well-developed. She is not ill-appearing.     Comments: Very pleasant female appears stated age in no acute distress sitting comfortably in exam room  HENT:     Head: Normocephalic and atraumatic. No raccoon eyes, Battle's sign or contusion.     Right Ear: Tympanic membrane, ear canal and external ear normal. No hemotympanum.     Left Ear: Tympanic membrane, ear canal and external ear normal. No hemotympanum.     Nose: Nose normal.     Mouth/Throat:     Tongue: Tongue does not deviate from midline.     Pharynx: Uvula midline. No oropharyngeal exudate or posterior oropharyngeal erythema.  Eyes:     Extraocular Movements: Extraocular movements intact.     Conjunctiva/sclera: Conjunctivae normal.      Pupils: Pupils are equal, round, and reactive to light.  Cardiovascular:     Rate and Rhythm: Normal rate and regular rhythm.     Heart sounds: Normal heart sounds, S1 normal and S2 normal. No murmur heard. Pulmonary:     Effort: Pulmonary effort is normal.     Breath sounds: Normal breath sounds. No wheezing, rhonchi or rales.     Comments: Clear to auscultation bilaterally Musculoskeletal:     Cervical back: Normal range of motion and neck supple. Tenderness present. No bony tenderness. Muscular tenderness present. No spinous process tenderness.     Thoracic back: Tenderness present. No bony tenderness.     Lumbar back: Tenderness present. No bony tenderness.     Right lower leg: No edema.     Left lower leg: No edema.     Comments: Strength 5/5 bilateral upper and lower extremities.  Symmetric strength all extremities.  Lymphadenopathy:     Head:     Right side of head: No submental, submandibular or tonsillar adenopathy.     Left side of head: No submental, submandibular or tonsillar adenopathy.  Neurological:     General: No focal deficit present.     Mental Status: She is alert and oriented to person, place, and time.     Cranial Nerves: Cranial nerves 2-12 are intact.     Motor: Motor function is intact.     Coordination: Coordination is intact.     Gait: Gait is intact.     Comments: No focal neurologic defect on exam.  Psychiatric:  Behavior: Behavior is cooperative.      UC Treatments / Results  Labs (all labs ordered are listed, but only abnormal results are displayed) Labs Reviewed  COMPREHENSIVE METABOLIC PANEL  CBC WITH DIFFERENTIAL/PLATELET  SEDIMENTATION RATE  C-REACTIVE PROTEIN    EKG   Radiology No results found.  Procedures Procedures (including critical care time)  Medications Ordered in UC Medications - No data to display  Initial Impression / Assessment and Plan / UC Course  I have reviewed the triage vital signs and the nursing  notes.  Pertinent labs & imaging results that were available during my care of the patient were reviewed by me and considered in my medical decision making (see chart for details).     Patient is well-appearing, afebrile, nontoxic, nontachycardic.  She was mildly hypertensive and we discussed that given elevated blood pressure in the setting of headache it is reasonable to go to the emergency room, however, patient reports that her headache has been improving and has been present for between 5 and 7 days making acute intracranial hemorrhage significantly less likely.  She declined emergency room evaluation today but we discussed that if anything changes or worsens including if she has a worsening headache or the worst take of her life she needs to go to the emergency room to which she expressed understanding.  I suspect symptoms are most likely related to a tension headache given distribution of pain, however, given she does have temporal discomfort as well as some jaw claudication symptoms will obtain basic blood work including CBC, CMP, inflammatory studies to investigate temporal arteritis.  We discussed that if she has elevated inflammatory markers we will call her with additional recommendations.  She was encouraged to continue her previously prescribed antihypertensives.  Recommend she avoid NSAIDs, decongestants, caffeine, sodium.  She is to rest and drink plenty of fluid.  She can take Tylenol as needed for pain.  Will start baclofen to help with pain.  We discussed that this can be sedating and she is not to drive or drink alcohol with taking it.  She is to take it directly before bed and take efforts to avoid falling.  Recommend close follow-up with her primary care ideally within the week.  We discussed that if she has any changing or worsening symptoms including increasing headache, worst headache of her life, dysarthria, focal weakness, nausea, vomiting, fever she needs to go to the emergency  room.  Strict return precautions given.  Work excuse note provided.  Final Clinical Impressions(s) / UC Diagnoses   Final diagnoses:  Left-sided headache  Elevated blood pressure reading     Discharge Instructions      Your exam is reassuring.  I will contact you if any of your blood work is abnormal.  Take baclofen at night to help with the headache.  This will make you sleepy so do not drive or drink alcohol while taking it.  As we discussed, if your blood pressure is elevated and you have a headache it is reasonable to go to the emergency room.  If your headache changes and worsens in any way or if they develop any dizziness, feeling unsteady, vision change, weakness in 1 part of your body, trouble speaking, nausea, vomiting, worse headache of your life you need to go to the emergency room immediately.  Please follow-up with your primary care within a few days and as we discussed if anything worsens go to the ER.  Tu examen es tranquilizador.  Me comunicar con  usted si alguno de sus anlisis de sangre es anormal.  Tome baclofeno por la noche para Paramedic el dolor de [place].  Esto le provocar sueo, as que no conduzca ni beba alcohol mientras lo est tomando.  Como comentamos, si tienes la presin arterial elevada y tienes dolor de cabeza es razonable acudir a urgencias.  Si su dolor de [place] y [place] o si desarrolla mareos, sensacin de inestabilidad, cambio de visin, debilidad en 1 parte de su cuerpo, dificultad para hablar, nuseas, vmitos, el peor dolor de cabeza de su vida, debe ir a la sala de emergencias. inmediatamente.  Haga un seguimiento con su atencin primaria dentro de [address] y, como comentamos, si algo empeora, vaya a la sala de Sports [facility].     ED Prescriptions     Medication Sig Dispense Auth. Provider   Baclofen 5 MG TABS Take 1 tablet (5 mg total) by mouth at bedtime as needed. 10 tablet Brigette Hopfer, Noberto Retort, PA-C      PDMP not reviewed this  encounter.   Jeani Hawking, PA-C 10/05/22 1942

## 2022-10-05 NOTE — ED Triage Notes (Signed)
Adult nurse used for clinical intake   Pt states she started having a headache x 5 days that comes and goes. She has taken tylenol which seemed to help only a little.   She complains of left hand numbness that comes and goes x 5 days.    She states her fasting blood sugars are 130-140

## 2022-10-06 ENCOUNTER — Ambulatory Visit: Payer: Self-pay | Admitting: Hematology and Oncology

## 2022-10-06 ENCOUNTER — Other Ambulatory Visit: Payer: Self-pay

## 2022-10-06 ENCOUNTER — Other Ambulatory Visit: Payer: Self-pay | Admitting: Obstetrics and Gynecology

## 2022-10-06 ENCOUNTER — Ambulatory Visit
Admission: RE | Admit: 2022-10-06 | Discharge: 2022-10-06 | Disposition: A | Discharge status: Home / Self Care | Payer: No Typology Code available for payment source | Source: Ambulatory Visit | Attending: Nurse Practitioner | Admitting: Nurse Practitioner

## 2022-10-06 VITALS — BP 135/69 | Wt 169.0 lb

## 2022-10-06 DIAGNOSIS — Z1231 Encounter for screening mammogram for malignant neoplasm of breast: Secondary | ICD-10-CM

## 2022-10-06 DIAGNOSIS — Z1211 Encounter for screening for malignant neoplasm of colon: Secondary | ICD-10-CM

## 2022-10-06 DIAGNOSIS — Z01419 Encounter for gynecological examination (general) (routine) without abnormal findings: Secondary | ICD-10-CM

## 2022-10-06 NOTE — Progress Notes (Signed)
Erin Huff is a 67 y.o. G5P0 female who presents to [facility] clinic today with no complaints.    Pap Smear: Pap smear completed today. Last Pap smear was 09/02/21 clinic and was normal. Per patient has history of an abnormal Pap smear. Last Pap smear result is available in Epic. 06/30/20 - LSIL with HPV+; 06/04/19 - LSIL with HPV+; 03/31/2017 - LSIL with HPV+   Physical exam: Breasts Breasts symmetrical. No skin abnormalities bilateral breasts. No nipple retraction bilateral breasts. No nipple discharge bilateral breasts. No lymphadenopathy. No lumps palpated bilateral breasts.  MS DIGITAL SCREENING TOMO BILATERAL  Result Date: 09/03/2021 CLINICAL DATA:  Screening. EXAM: DIGITAL SCREENING BILATERAL MAMMOGRAM WITH TOMOSYNTHESIS AND CAD TECHNIQUE: Bilateral screening digital craniocaudal and mediolateral oblique mammograms were obtained. Bilateral screening digital breast tomosynthesis was performed. The images were evaluated with computer-aided detection. COMPARISON:  Previous exam(s). ACR Breast Density Category b: There are scattered areas of fibroglandular density. FINDINGS: There are no findings suspicious for malignancy. IMPRESSION: No mammographic evidence of malignancy. A result letter of this screening mammogram will be mailed directly to the patient. RECOMMENDATION: Screening mammogram in one year. (Code:SM-B-01Y) BI-RADS CATEGORY  1: Negative. Electronically Signed   By: Ted Mcalpine M.D.   On: 09/03/2021 14:19   MS DIGITAL SCREENING TOMO BILATERAL  Result Date: 03/25/2020 CLINICAL DATA:  Screening. EXAM: DIGITAL SCREENING BILATERAL MAMMOGRAM WITH TOMO AND CAD COMPARISON:  Previous exam(s). ACR Breast Density Category b: There are scattered areas of fibroglandular density. FINDINGS: There are no findings suspicious for malignancy. The images were evaluated with computer-aided detection. IMPRESSION: No mammographic evidence of malignancy. A result letter of this screening  mammogram will be mailed directly to the patient. RECOMMENDATION: Screening mammogram in one year. (Code:SM-B-01Y) BI-RADS CATEGORY  1: Negative. Electronically Signed   By: Bary Richard M.D.   On: 03/25/2020 10:44   MS DIGITAL SCREENING TOMO BILATERAL  Result Date: 01/15/2019 CLINICAL DATA:  Screening. EXAM: DIGITAL SCREENING BILATERAL MAMMOGRAM WITH TOMO AND CAD COMPARISON:  Previous exam(s). ACR Breast Density Category b: There are scattered areas of fibroglandular density. FINDINGS: There are no findings suspicious for malignancy. Images were processed with CAD. IMPRESSION: No mammographic evidence of malignancy. A result letter of this screening mammogram will be mailed directly to the patient. RECOMMENDATION: Screening mammogram in one year. (Code:SM-B-01Y) BI-RADS CATEGORY  1: Negative. Electronically Signed   By: Annia Belt M.D.   On: 01/15/2019 10:26         Pelvic/Bimanual Ext Genitalia No lesions, no swelling and no discharge observed on external genitalia.        Vagina Vagina pink and normal texture. No lesions or discharge observed in vagina.        Cervix Cervix is present. Cervix pink and of normal texture. No discharge observed.    Uterus Uterus is present and palpable. Uterus in normal position and normal size.        Adnexae Bilateral ovaries present and palpable. No tenderness on palpation.         Rectovaginal No rectal exam completed today since patient had no rectal complaints. No skin abnormalities observed on exam.     Smoking History: Patient has never smoked and was not referred to quit line.    Patient Navigation: Patient education provided. Access to services provided for patient through [facility] program. Viviana Simpler interpreter provided. No transportation provided   Colorectal Cancer Screening: Per patient has never had colonoscopy completed No complaints today. FIT test given.    Breast  and Cervical Cancer Risk Assessment: Patient does not have  family history of breast cancer, known genetic mutations, or radiation treatment to the chest before age 38. Patient has history of cervical dysplasia, immunocompromised, or DES exposure in-utero.  Risk Scores as of Encounter on 10/06/2022     Erin Huff           5-year 1.63%   Lifetime 6.17%   This patient is [place] but has no documented birth country, so the [place] model used data from [place] patients to calculate their risk score. Document a birth country in the Demographics activity for a more accurate score.         Last calculated by Caprice Red, CMA on 10/06/2022 at  1:57 PM        A: [facility] exam with pap smear No complaints with benign exam.   P: Referred patient to the [facility] for a screening mammogram. Appointment scheduled 10/06/22.  Pascal Lux, NP 10/06/2022 2:03 PM

## 2022-10-06 NOTE — Patient Instructions (Addendum)
Taught Erin Huff about self breast awareness and gave educational materials to take home. Patient did need a Pap smear today due to last Pap smear was in 09/02/21 per patient.  Let her know [facility] will cover Pap smears every 5 years unless has a history of abnormal Pap smears. Referred patient to the [facility] for screening mammogram. Appointment scheduled for 10/06/22. Patient aware of appointment and will be there. Let patient know will follow up with her within the next couple weeks with results. Erin Huff verbalized understanding.  Pascal Lux, NP 1:57 PM

## 2022-10-11 LAB — CYTOLOGY - PAP
Comment: NEGATIVE
Comment: NEGATIVE
Comment: NEGATIVE
Diagnosis: NEGATIVE
HPV 16: NEGATIVE
HPV 18 / 45: NEGATIVE
High risk HPV: POSITIVE — AB

## 2022-10-17 ENCOUNTER — Telehealth: Payer: Self-pay

## 2022-10-17 NOTE — Telephone Encounter (Signed)
Using [facility] interpreter, Erin Huff, pt has been advised of her PAP result. Pt understands to repeat her PAP in one year. All questions answered and pt expressed understanding of this information.

## 2022-10-18 ENCOUNTER — Other Ambulatory Visit: Payer: Self-pay | Admitting: Family Medicine

## 2022-10-18 ENCOUNTER — Other Ambulatory Visit: Payer: Self-pay

## 2022-10-18 DIAGNOSIS — E1165 Type 2 diabetes mellitus with hyperglycemia: Secondary | ICD-10-CM

## 2022-10-18 MED ORDER — TRUE METRIX BLOOD GLUCOSE TEST VI STRP
ORAL_STRIP | 3 refills | Status: DC
Start: 2022-10-18 — End: 2023-05-19
  Filled 2022-10-18: qty 100, 25d supply, fill #0
  Filled 2023-01-31: qty 100, 25d supply, fill #1

## 2022-10-19 ENCOUNTER — Other Ambulatory Visit: Payer: Self-pay

## 2022-10-20 ENCOUNTER — Other Ambulatory Visit: Payer: Self-pay

## 2022-11-03 ENCOUNTER — Other Ambulatory Visit: Payer: Self-pay

## 2022-11-04 ENCOUNTER — Other Ambulatory Visit: Payer: Self-pay

## 2022-11-15 ENCOUNTER — Other Ambulatory Visit: Payer: Self-pay | Admitting: Nurse Practitioner

## 2022-11-15 ENCOUNTER — Ambulatory Visit: Payer: Self-pay | Attending: Nurse Practitioner | Admitting: Nurse Practitioner

## 2022-11-15 ENCOUNTER — Encounter: Payer: Self-pay | Admitting: Nurse Practitioner

## 2022-11-15 ENCOUNTER — Other Ambulatory Visit: Payer: Self-pay

## 2022-11-15 VITALS — BP 132/73 | HR 64 | Ht 60.0 in | Wt 168.8 lb

## 2022-11-15 DIAGNOSIS — I10 Essential (primary) hypertension: Secondary | ICD-10-CM

## 2022-11-15 DIAGNOSIS — E039 Hypothyroidism, unspecified: Secondary | ICD-10-CM

## 2022-11-15 DIAGNOSIS — E785 Hyperlipidemia, unspecified: Secondary | ICD-10-CM

## 2022-11-15 DIAGNOSIS — E559 Vitamin D deficiency, unspecified: Secondary | ICD-10-CM

## 2022-11-15 DIAGNOSIS — E1165 Type 2 diabetes mellitus with hyperglycemia: Secondary | ICD-10-CM

## 2022-11-15 DIAGNOSIS — Z7984 Long term (current) use of oral hypoglycemic drugs: Secondary | ICD-10-CM

## 2022-11-15 LAB — POCT GLYCOSYLATED HEMOGLOBIN (HGB A1C): Hemoglobin A1C: 8.2 % — AB (ref 4.0–5.6)

## 2022-11-15 MED ORDER — EMPAGLIFLOZIN 25 MG PO TABS
25.0000 mg | ORAL_TABLET | Freq: Every day | ORAL | 1 refills | Status: DC
Start: 2022-11-15 — End: 2023-02-17

## 2022-11-15 MED ORDER — EMPAGLIFLOZIN 25 MG PO TABS
25.0000 mg | ORAL_TABLET | Freq: Every day | ORAL | 1 refills | Status: DC
Start: 2022-11-15 — End: 2022-11-15
  Filled 2022-11-15: qty 90, 90d supply, fill #0

## 2022-11-15 MED ORDER — SPIRONOLACTONE 25 MG PO TABS
25.0000 mg | ORAL_TABLET | Freq: Every day | ORAL | 1 refills | Status: DC
Start: 2022-11-15 — End: 2023-02-17
  Filled 2022-11-15: qty 90, 90d supply, fill #0
  Filled 2023-01-31: qty 90, 90d supply, fill #1

## 2022-11-15 MED ORDER — LEVOTHYROXINE SODIUM 100 MCG PO TABS
100.0000 ug | ORAL_TABLET | Freq: Every day | ORAL | 2 refills | Status: DC
Start: 2022-11-15 — End: 2023-10-18
  Filled 2022-11-15: qty 90, 90d supply, fill #0
  Filled 2023-01-04: qty 30, 30d supply, fill #0
  Filled 2023-03-02: qty 30, 30d supply, fill #1
  Filled 2023-03-29: qty 30, 30d supply, fill #2
  Filled 2023-04-26: qty 30, 30d supply, fill #3
  Filled 2023-05-31 (×2): qty 30, 30d supply, fill #4
  Filled 2023-06-30: qty 30, 30d supply, fill #5
  Filled 2023-07-27: qty 30, 30d supply, fill #6
  Filled 2023-08-28: qty 30, 30d supply, fill #7
  Filled 2023-09-26: qty 30, 30d supply, fill #8

## 2022-11-15 MED ORDER — GLIPIZIDE ER 10 MG PO TB24
20.0000 mg | ORAL_TABLET | Freq: Every day | ORAL | 1 refills | Status: DC
Start: 2022-11-15 — End: 2023-02-17
  Filled 2022-11-15: qty 180, 90d supply, fill #0
  Filled 2023-02-16: qty 180, 90d supply, fill #1

## 2022-11-15 NOTE — Patient Instructions (Addendum)
CITRACAL with D3 MAXIMUM PLUS

## 2022-11-15 NOTE — Progress Notes (Signed)
Assessment & Plan:  Erin Huff was seen today for headache.  Diagnoses and all orders for this visit:  Uncontrolled type 2 diabetes mellitus with hyperglycemia, without long-term current use of insulin  DOSE CHANGE. Increase Jardiance to 25 mg daily.  -     Microalbumin / creatinine urine ratio -     POCT glycosylated hemoglobin (Hb A1C) -     glipiZIDE (GLUCOTROL XL) 10 MG 24 hr tablet; Take 2 tablets (20 mg total) by mouth daily with breakfast. -     Ambulatory referral to Ophthalmology -     empagliflozin (JARDIANCE) 25 MG TABS tablet; Take 1 tablet (25 mg total) by mouth daily before breakfast. For diabetes  Dyslipidemia, goal LDL below 70 -     Lipid panel  Acquired hypothyroidism -     Thyroid Panel With TSH -     levothyroxine (SYNTHROID) 100 MCG tablet; Take 1 tablet (100 mcg total) by mouth daily before breakfast.  Essential hypertension -     spironolactone (ALDACTONE) 25 MG tablet; Take 1 tablet (25 mg total) by mouth daily.  Vitamin D deficiency -     VITAMIN D 25 Hydroxy (Vit-D Deficiency, Fractures)    Patient has been counseled on age-appropriate routine health concerns for screening and prevention. These are reviewed and up-to-date. Referrals have been placed accordingly. Immunizations are up-to-date or declined.    Subjective:   Chief Complaint  Patient presents with   Headache    Tamani Luis-Altamirano 67 y.o. female presents to office today for follow up to DM and HTN   She has a past medical history of Allergy, Depression, DM2, GERD, Hyperlipidemia, Hypertension, Moderate to severe aortic stenosis (03/2011), Thoracic aortic aneurysm (HCC) (04/06/2021), and Thyroid disease.   DM 2 Diabetes is not at goal of A1c <7.0. She is currently taking metformin, glipizide and metformin as prescribed.  Lab Results  Component Value Date   HGBA1C 8.2 (A) 07/19/2022    Lab Results  Component Value Date   HGBA1C 8.2 (A) 07/19/2022  Blood pressure is well  controlled.  BP Readings from Last 3 Encounters:  11/15/22 132/73  10/06/22 135/69  10/05/22 (!) 164/79   LDL at goal with rosuvastatin 5 mg daily.  Lab Results  Component Value Date   LDLCALC 52 12/16/2021     Review of Systems  Constitutional:  Negative for fever, malaise/fatigue and weight loss.  HENT: Negative.  Negative for nosebleeds.   Eyes: Negative.  Negative for blurred vision, double vision and photophobia.  Respiratory: Negative.  Negative for cough and shortness of breath.   Cardiovascular: Negative.  Negative for chest pain, palpitations and leg swelling.  Gastrointestinal: Negative.  Negative for heartburn, nausea and vomiting.  Musculoskeletal: Negative.  Negative for myalgias.  Neurological:  Positive for headaches. Negative for dizziness, focal weakness and seizures.  Psychiatric/Behavioral: Negative.  Negative for suicidal ideas.     Past Medical History:  Diagnosis Date   Allergy    Depression    mild   Diabetes mellitus without complication (HCC)    GERD (gastroesophageal reflux disease)    Hyperlipidemia    Hypertension    Moderate to severe aortic stenosis 03/2011   Moderate to severe AS noted on echo with mean gradient 27 mmHg.  Also noted moderate dilation of the ascending aorta-46 mm.   Thoracic aortic aneurysm (HCC) 04/06/2021   Measuring 46 mm on Echo; CT angiogram chest-aorta 08/10/2021: 4.2 x 4.2 ascending aortic dilation.  Mildly enlarged heart.   Thyroid  disease     Past Surgical History:  Procedure Laterality Date   NO PAST SURGERIES     TRANSTHORACIC ECHOCARDIOGRAM  04/05/2021   EF 60 to 65%.  GR 1 DD.  Normal RV size and mildly elevated RVSP.  Normal RAP.  Moderate to Severe Aortic Stenosis.  Mean gradient 27 mmHg.  Moderate dilation of the ascending aorta measuring 46 mm.  Recommend CTA    Family History  Problem Relation Age of Onset   Hypertension Sister    Diabetes Brother    Breast cancer Maternal Aunt    Colon cancer Neg Hx     Colon polyps Neg Hx    Esophageal cancer Neg Hx    Rectal cancer Neg Hx    Stomach cancer Neg Hx     Social History Reviewed with no changes to be made today.   Outpatient Medications Prior to Visit  Medication Sig Dispense Refill   Baclofen 5 MG TABS Take 1 tablet (5 mg total) by mouth at bedtime as needed. 10 tablet 0   Blood Glucose Monitoring Suppl (TRUE METRIX METER) w/Device KIT Use as directed 1 kit 0   carvedilol (COREG) 6.25 MG tablet Take 2 tablets (12.5 mg total) by mouth 2 (two) times daily. 180 tablet 3   clobetasol cream (TEMOVATE) 0.05 % Apply 1 Application topically 2 (two) times daily. USE FOR 2 WEEKS THEN STOP 60 g 1   glucose blood (TRUE METRIX BLOOD GLUCOSE TEST) test strip USE AS INSTRUCTED. 100 strip 3   metFORMIN (GLUCOPHAGE) 1000 MG tablet Take 1 tablet (1,000 mg total) by mouth 2 (two) times daily with a meal. 180 tablet 2   rosuvastatin (CRESTOR) 5 MG tablet Take 1 tablet (5 mg total) by mouth daily. 90 tablet 2   TRUEplus Lancets 28G MISC Use as directed 100 each 5   empagliflozin (JARDIANCE) 10 MG TABS tablet Take 1 tablet (10 mg total) by mouth daily before breakfast. For diabetes 90 tablet 1   glipiZIDE (GLUCOTROL XL) 10 MG 24 hr tablet Take 2 tablets (20 mg total) by mouth daily with breakfast. 180 tablet 1   levothyroxine (SYNTHROID) 100 MCG tablet Take 1 tablet (100 mcg total) by mouth daily before breakfast. 90 tablet 2   loratadine (CLARITIN) 10 MG tablet Take 1 tablet (10 mg total) by mouth daily. 90 tablet 3   spironolactone (ALDACTONE) 25 MG tablet Take 1 tablet (25 mg total) by mouth daily. 30 tablet 3   No facility-administered medications prior to visit.    Allergies  Allergen Reactions   Motrin [Ibuprofen] Rash   Penicillins Rash       Objective:    BP 132/73 (BP Location: Left Arm, Patient Position: Sitting, Cuff Size: Normal)   Pulse 64   Ht 5' (1.524 m)   Wt 168 lb 12.8 oz (76.6 kg)   SpO2 (!) 9%   BMI 32.97 kg/m  Wt Readings  from Last 3 Encounters:  11/15/22 168 lb 12.8 oz (76.6 kg)  10/06/22 169 lb (76.7 kg)  07/28/22 176 lb 3.2 oz (79.9 kg)    Physical Exam Vitals and nursing note reviewed.  Constitutional:      Appearance: She is well-developed.  HENT:     Head: Normocephalic and atraumatic.  Cardiovascular:     Rate and Rhythm: Normal rate and regular rhythm.     Heart sounds: Normal heart sounds. No murmur heard.    No friction rub. No gallop.  Pulmonary:     Effort:  Pulmonary effort is normal. No tachypnea or respiratory distress.     Breath sounds: Normal breath sounds. No decreased breath sounds, wheezing, rhonchi or rales.  Chest:     Chest wall: No tenderness.  Abdominal:     General: Bowel sounds are normal.     Palpations: Abdomen is soft.  Musculoskeletal:        General: Normal range of motion.     Cervical back: Normal range of motion.  Skin:    General: Skin is warm and dry.  Neurological:     Mental Status: She is alert and oriented to person, place, and time.     Coordination: Coordination normal.  Psychiatric:        Behavior: Behavior normal. Behavior is cooperative.        Thought Content: Thought content normal.        Judgment: Judgment normal.          Patient has been counseled extensively about nutrition and exercise as well as the importance of adherence with medications and regular follow-up. The patient was given clear instructions to go to ER or return to medical center if symptoms don't improve, worsen or new problems develop. The patient verbalized understanding.   Follow-up: Return in about 3 months (around 02/14/2023).   Claiborne Rigg, FNP-BC [facility] [place], [place] [phone]   11/15/2022, 2:14 PM

## 2022-11-16 LAB — LIPID PANEL
Chol/HDL Ratio: 2.8 ratio (ref 0.0–4.4)
Cholesterol, Total: 136 mg/dL (ref 100–199)
HDL: 49 mg/dL (ref >39)
LDL Chol Calc (NIH): 62 mg/dL (ref 0–99)
Triglycerides: 143 mg/dL (ref 0–149)
VLDL Cholesterol Cal: 25 mg/dL (ref 5–40)

## 2022-11-16 LAB — THYROID PANEL WITH TSH
Free Thyroxine Index: 2.5 (ref 1.2–4.9)
T3 Uptake Ratio: 26 % (ref 24–39)
T4, Total: 9.5 ug/dL (ref 4.5–12.0)
TSH: 0.421 u[IU]/mL — ABNORMAL LOW (ref 0.450–4.500)

## 2022-11-16 LAB — VITAMIN D 25 HYDROXY (VIT D DEFICIENCY, FRACTURES): Vit D, 25-Hydroxy: 41.3 ng/mL (ref 30.0–100.0)

## 2022-11-16 LAB — MICROALBUMIN / CREATININE URINE RATIO
Creatinine, Urine: 21.9 mg/dL
Microalb/Creat Ratio: <14 mg/g creat (ref 0–29)
Microalbumin, Urine: <3 ug/mL

## 2022-11-24 ENCOUNTER — Other Ambulatory Visit: Payer: Self-pay

## 2022-11-25 ENCOUNTER — Other Ambulatory Visit: Payer: Self-pay

## 2022-11-30 ENCOUNTER — Other Ambulatory Visit: Payer: Self-pay

## 2022-11-30 ENCOUNTER — Ambulatory Visit: Payer: Self-pay | Attending: Nurse Practitioner

## 2022-11-30 ENCOUNTER — Ambulatory Visit: Payer: Self-pay | Admitting: Nurse Practitioner

## 2022-11-30 DIAGNOSIS — Z23 Encounter for immunization: Secondary | ICD-10-CM

## 2022-12-01 ENCOUNTER — Other Ambulatory Visit: Payer: Self-pay

## 2022-12-01 NOTE — Addendum Note (Signed)
Addended by: Elsie Lincoln F on: 12/01/2022 10:41 AM   Modules accepted: Level of Service

## 2022-12-01 NOTE — Progress Notes (Signed)
Flu vaccine administered in per protocols.  Information sheet given. Patient denies and pain or discomfort at injection site. Tolerated injection well no reaction.

## 2022-12-02 ENCOUNTER — Other Ambulatory Visit: Payer: Self-pay

## 2022-12-05 ENCOUNTER — Ambulatory Visit (HOSPITAL_COMMUNITY)
Admission: EM | Admit: 2022-12-05 | Discharge: 2022-12-05 | Disposition: A | Discharge status: Home / Self Care | Payer: Self-pay | Attending: Internal Medicine | Admitting: Internal Medicine

## 2022-12-05 ENCOUNTER — Encounter (HOSPITAL_COMMUNITY): Payer: Self-pay

## 2022-12-05 DIAGNOSIS — R300 Dysuria: Secondary | ICD-10-CM

## 2022-12-05 DIAGNOSIS — B029 Zoster without complications: Secondary | ICD-10-CM

## 2022-12-05 LAB — POCT URINALYSIS DIP (MANUAL ENTRY)
Bilirubin, UA: NEGATIVE
Glucose, UA: >=1000 mg/dL — AB
Ketones, POC UA: NEGATIVE mg/dL
Leukocytes, UA: NEGATIVE
Nitrite, UA: NEGATIVE
Protein Ur, POC: NEGATIVE mg/dL
Spec Grav, UA: 1.01 (ref 1.010–1.025)
Urobilinogen, UA: 0.2 U/dL
pH, UA: 7 (ref 5.0–8.0)

## 2022-12-05 MED ORDER — NITROFURANTOIN MONOHYD MACRO 100 MG PO CAPS: 100.0000 mg | ORAL_CAPSULE | Freq: Two times a day (BID) | ORAL | 0 refills | Status: DC

## 2022-12-05 MED ORDER — VALACYCLOVIR HCL 1 G PO TABS: 1000.0000 mg | ORAL_TABLET | Freq: Three times a day (TID) | ORAL | 0 refills | Status: AC

## 2022-12-05 NOTE — Discharge Instructions (Addendum)
Rash on the left flank appears to be shingles.  Will treat with the following: Valtrex 1 tablet 3 times daily for 7 days  Urinalysis shows some blood in the urine. Given the symptoms we will treat with the following: Macrobid 100mg  twice daily for 5 days Drink plenty of water and stay hydrated

## 2022-12-05 NOTE — ED Triage Notes (Signed)
Patient here today with c/o pain rash on the left side of her flank X 5 days. She has been taking Tylenol with some relief.

## 2022-12-05 NOTE — ED Provider Notes (Signed)
[facility]    CSN: [id number] Arrival date & time: 12/05/22  1633      History   Chief Complaint Chief Complaint  Patient presents with   Rash    HPI Erin Huff is a 67 y.o. female.   66 year old female who presents to urgent care with complaints of a painful rash on the left flank that just started in the last several days.  She reports that it started as a very small area that now has developed into a cluster of blisters.  She also relates that she is having some pain that radiates around the left flank to the left abdomen.  She denies any nausea or vomiting, diarrhea or constipation.  She does relate that it is burning when she urinates and this just started recently as well.   Rash Associated symptoms: abdominal pain (Radiating from the left flank rash)   Associated symptoms: no fever, no joint pain, no shortness of breath, no sore throat and not vomiting     Past Medical History:  Diagnosis Date   Allergy    Depression    mild   Diabetes mellitus without complication (HCC)    GERD (gastroesophageal reflux disease)    Hyperlipidemia    Hypertension    Moderate to severe aortic stenosis 03/2011   Moderate to severe AS noted on echo with mean gradient 27 mmHg.  Also noted moderate dilation of the ascending aorta-46 mm.   Thoracic aortic aneurysm (HCC) 04/06/2021   Measuring 46 mm on Echo; CT angiogram chest-aorta 08/10/2021: 4.2 x 4.2 ascending aortic dilation.  Mildly enlarged heart.   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  GR 1 DD.  Normal RV size and mildly elevated RVSP.  Normal RAP.  Moderate to Severe Aortic Stenosis.  Mean gradient 27 mmHg.  Moderate dilation of the ascending aorta measuring 46 mm.  Recommend CTA    OB History     Gravida  5   Para      Term      Preterm      AB      Living  5      SAB      IAB      Ectopic      Multiple      Live Births  5            Home Medications    Prior to Admission medications   Medication Sig Start Date End Date Taking? Authorizing Provider  Baclofen 5 MG TABS Take 1 tablet (5 mg total) by mouth at bedtime as needed. 10/05/22   Raspet, Noberto Retort, PA-C  Blood Glucose Monitoring Suppl (TRUE METRIX METER) w/Device KIT Use as directed 06/14/16   Quentin Angst, MD  carvedilol (COREG) 6.25 MG tablet Take 2 tablets (12.5 mg total) by mouth 2 (two) times daily. 08/30/22   Claiborne Rigg, NP  clobetasol cream (TEMOVATE) 0.05 % Apply 1 Application topically 2 (two) times daily. USE FOR 2 WEEKS THEN STOP 08/09/22   Terri Piedra, DO  empagliflozin (JARDIANCE) 25 MG TABS tablet Take 1 tablet (25 mg total) by mouth daily before breakfast. For diabetes 11/15/22   Claiborne Rigg, NP  glipiZIDE (GLUCOTROL XL) 10 MG 24 hr tablet Take 2 tablets (20 mg total) by mouth daily with breakfast. 11/15/22   Claiborne Rigg, NP  glucose blood (TRUE METRIX BLOOD GLUCOSE TEST) test strip USE AS INSTRUCTED. 10/18/22 10/18/23  Hoy Register, MD  levothyroxine (SYNTHROID) 100 MCG tablet Take 1 tablet (100 mcg total) by mouth daily before breakfast. 11/15/22   Claiborne Rigg, NP  metFORMIN (GLUCOPHAGE) 1000 MG tablet Take 1 tablet (1,000 mg total)  by mouth 2 (two) times daily with a meal. 07/19/22   Claiborne Rigg, NP  rosuvastatin (CRESTOR) 5 MG tablet Take 1 tablet (5 mg total) by mouth daily. 02/16/22   Claiborne Rigg, NP  spironolactone (ALDACTONE) 25 MG tablet Take 1 tablet (25 mg total) by mouth daily. 11/15/22   Claiborne Rigg, NP  TRUEplus Lancets 28G MISC Use as directed 09/17/21   Hoy Register, MD    Family History Family History  Problem Relation Age of Onset   Hypertension Sister    Diabetes Brother    Breast cancer Maternal Aunt    Colon cancer Neg Hx    Colon polyps Neg Hx    Esophageal cancer Neg Hx    Rectal cancer Neg Hx    Stomach cancer Neg Hx     Social History Social History   Tobacco Use   Smoking status: Never   Smokeless tobacco: Never  Vaping Use   Vaping status: Never Used  Substance Use Topics   Alcohol use: No    Comment: rare    Drug use: No     Allergies   Motrin [ibuprofen] and Penicillins   Review of Systems Review of Systems  Constitutional:  Negative for chills and fever.  HENT:  Negative for ear pain and sore throat.   Eyes:  Negative for pain and visual disturbance.  Respiratory:  Negative for cough and shortness of breath.   Cardiovascular:  Negative for chest pain and palpitations.  Gastrointestinal:  Positive for abdominal pain (Radiating from the left flank rash). Negative for vomiting.  Genitourinary:  Positive for dysuria. Negative for hematuria and pelvic pain.  Musculoskeletal:  Negative for arthralgias and back pain.  Skin:  Positive for rash. Negative for color change.  Neurological:  Negative for seizures and syncope.  All other systems reviewed and are negative.    Physical Exam Triage Vital Signs ED Triage Vitals  Encounter Vitals Group     BP 12/05/22 1734 (!) 131/59     Systolic BP Percentile --      Diastolic BP Percentile --      Pulse Rate 12/05/22 1734 (!) 59     Resp 12/05/22 1734 16     Temp 12/05/22 1734 98.1 F (36.7 C)     Temp  Source 12/05/22 1734 Oral     SpO2 12/05/22 1734 96 %     Weight 12/05/22 1733 168 lb (76.2 kg)     Height 12/05/22 1733 5' (1.524 m)     Head Circumference --  Peak Flow --      Pain Score 12/05/22 1733 4     Pain Loc --      Pain Education --      Exclude from Growth Chart --    No data found.  Updated Vital Signs BP (!) 131/59 (BP Location: Left Arm)   Pulse (!) 59   Temp 98.1 F (36.7 C) (Oral)   Resp 16   Ht 5' (1.524 m)   Wt 168 lb (76.2 kg)   SpO2 96%   BMI 32.81 kg/m   Visual Acuity Right Eye Distance:   Left Eye Distance:   Bilateral Distance:    Right Eye Near:   Left Eye Near:    Bilateral Near:     Physical Exam Vitals and nursing note reviewed.  Constitutional:      General: She is not in acute distress.    Appearance: She is well-developed.  HENT:     Head: Normocephalic and atraumatic.  Eyes:     Conjunctiva/sclera: Conjunctivae normal.  Cardiovascular:     Rate and Rhythm: Normal rate and regular rhythm.     Heart sounds: No murmur heard. Pulmonary:     Effort: Pulmonary effort is normal. No respiratory distress.     Breath sounds: Normal breath sounds.  Abdominal:     Palpations: Abdomen is soft.     Tenderness: There is no abdominal tenderness.  Musculoskeletal:        General: No swelling.     Cervical back: Neck supple.  Skin:    General: Skin is warm and dry.     Capillary Refill: Capillary refill takes less than 2 seconds.       Neurological:     Mental Status: She is alert.  Psychiatric:        Mood and Affect: Mood normal.      UC Treatments / Results  Labs (all labs ordered are listed, but only abnormal results are displayed) Labs Reviewed - No data to display  EKG   Radiology No results found.  Procedures Procedures (including critical care time)  Medications Ordered in UC Medications - No data to display  Initial Impression / Assessment and Plan / UC Course  I have reviewed the triage vital signs and  the nursing notes.  Pertinent labs & imaging results that were available during my care of the patient were reviewed by me and considered in my medical decision making (see chart for details).     Herpes zoster without complication  Dysuria   Rash on the left flank appears to be shingles.  Will treat with the following: Valtrex 1 tablet 3 times daily for 7 days  Urinalysis shows some blood in the urine. Given the symptoms we will treat with the following: Macrobid 100mg  twice daily for 5 days Drink plenty of water and stay hydrated Final Clinical Impressions(s) / UC Diagnoses   Final diagnoses:  None   Discharge Instructions   None    ED Prescriptions   None    PDMP not reviewed this encounter.   Landis Martins, [place] 12/05/22 [phone]

## 2023-01-04 ENCOUNTER — Other Ambulatory Visit: Payer: Self-pay

## 2023-01-07 IMAGING — MG MM DIGITAL SCREENING BILAT W/ TOMO AND CAD
8 series · 8 of 24 positions shown · non-contrast
Comparison: Previous exam(s).

CLINICAL DATA: Screening.

EXAM:
DIGITAL SCREENING BILATERAL MAMMOGRAM WITH TOMO AND CAD

[R CC synth-2D]
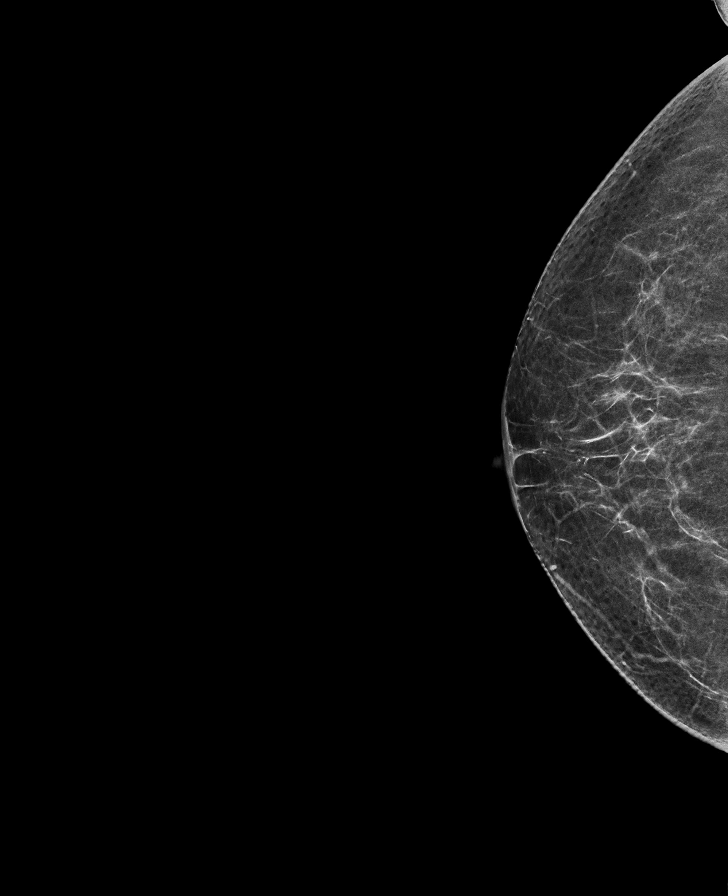

[L MLO synth-2D]
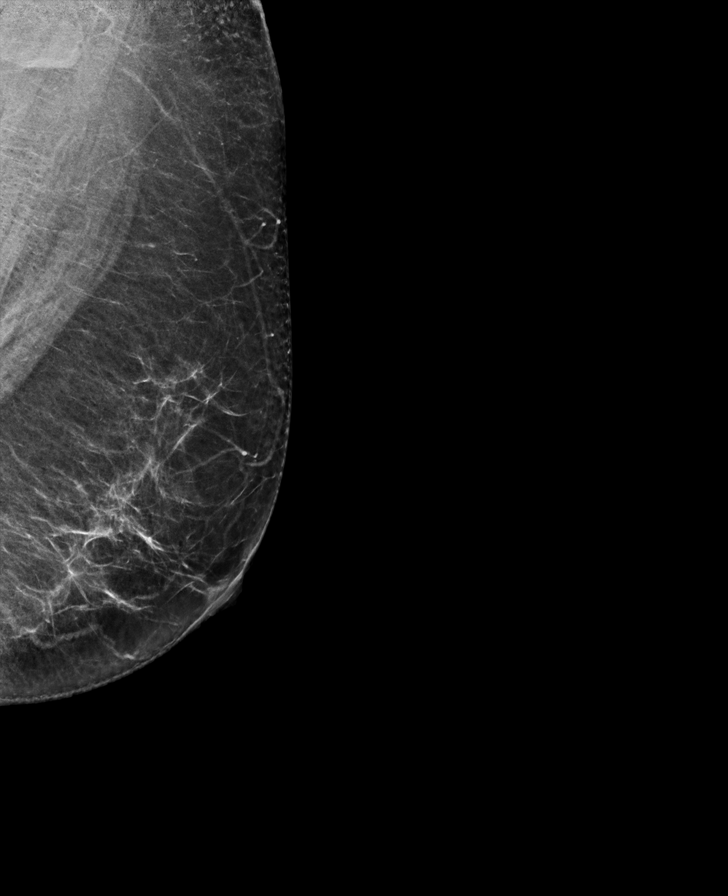

[R MLO synth-2D]
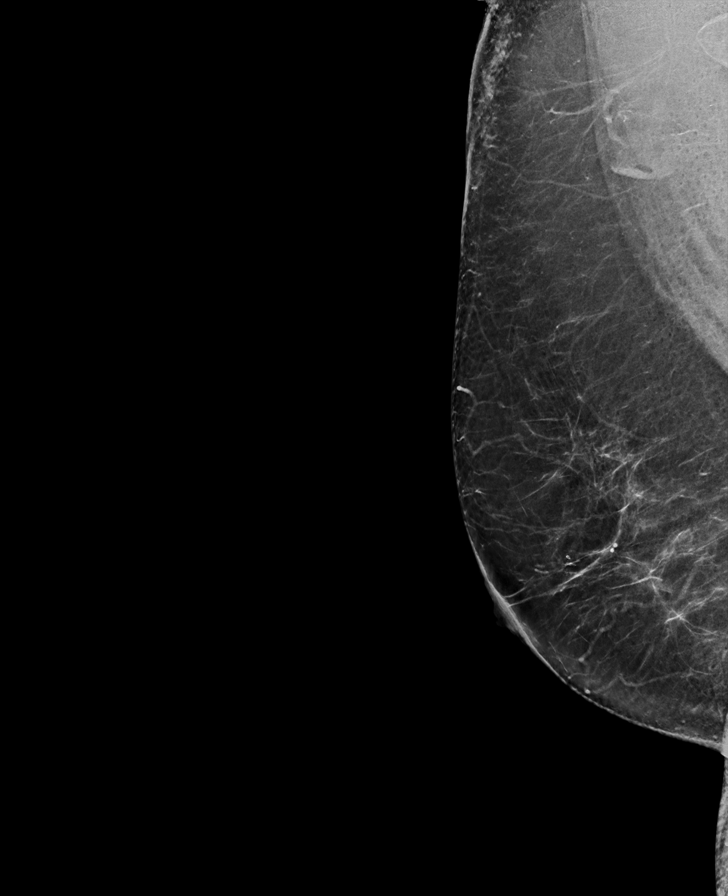

[L CC synth-2D]
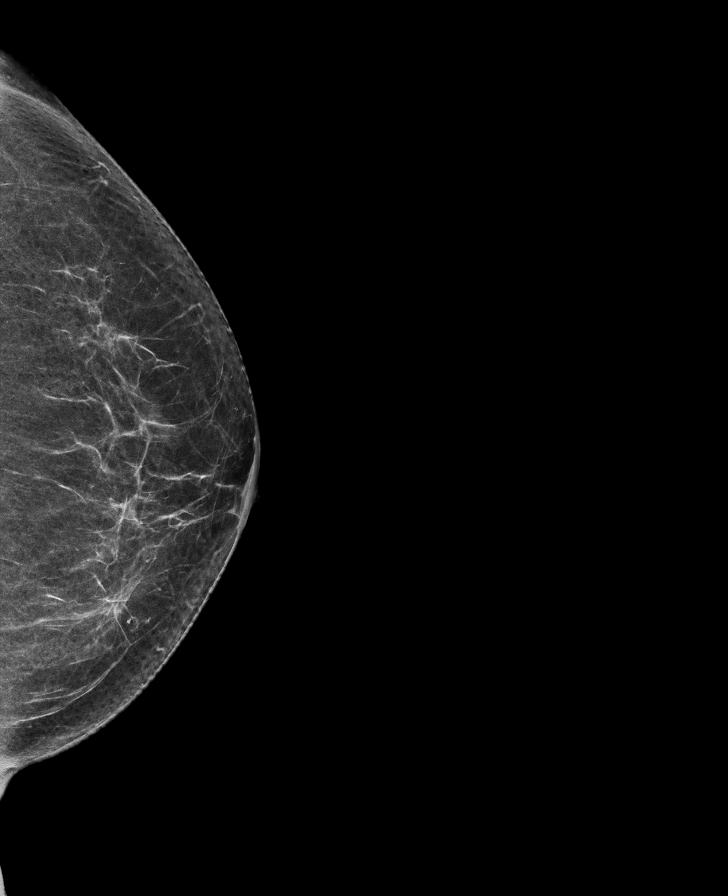

[L MLO tomo · tomo slice 35/69.0]
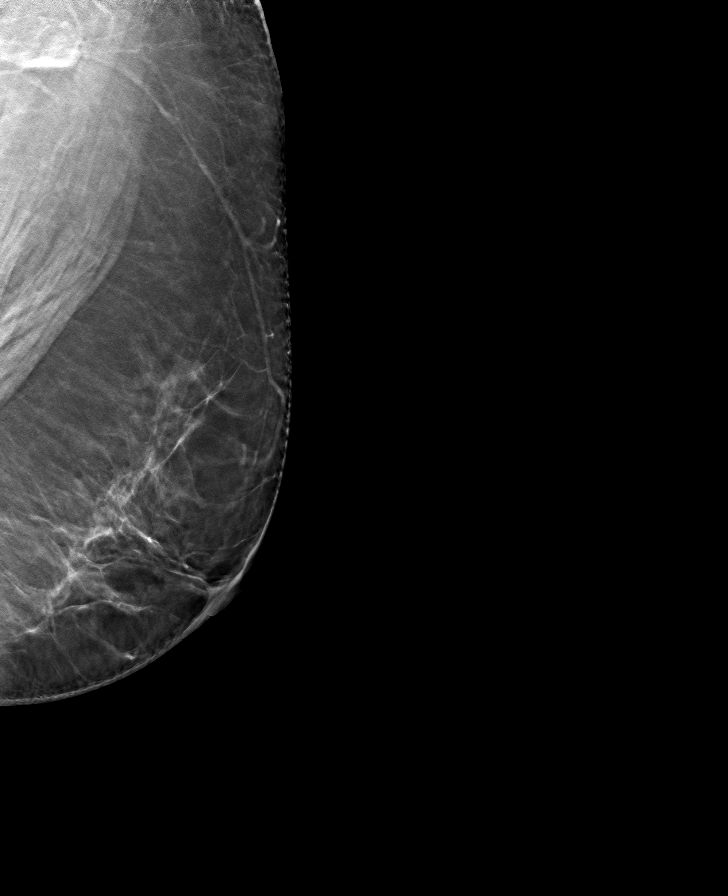

[R CC tomo · tomo slice 31/60.0]
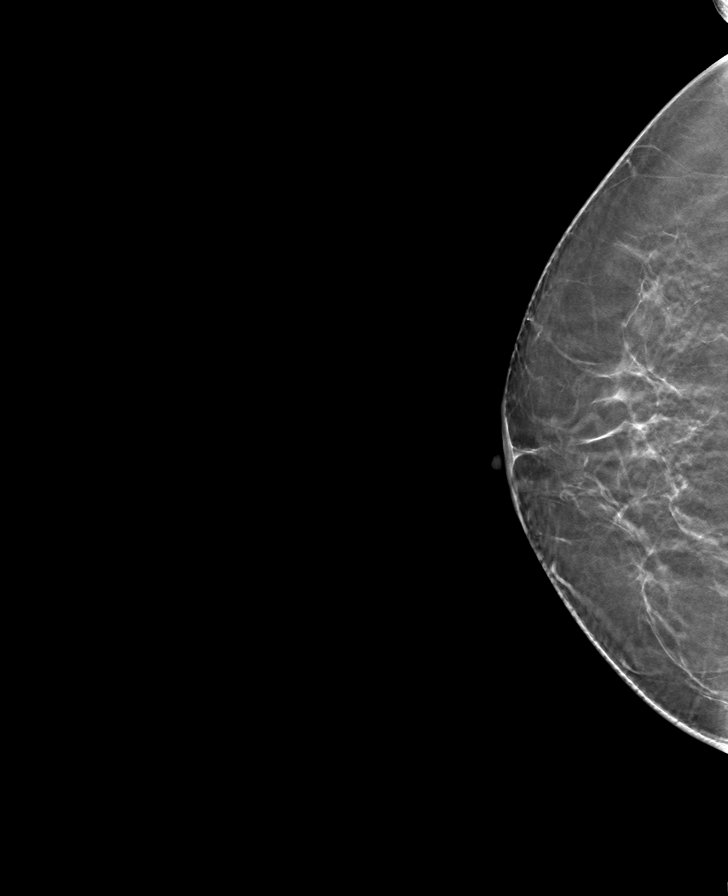

[L CC tomo · tomo slice 33/66.0]
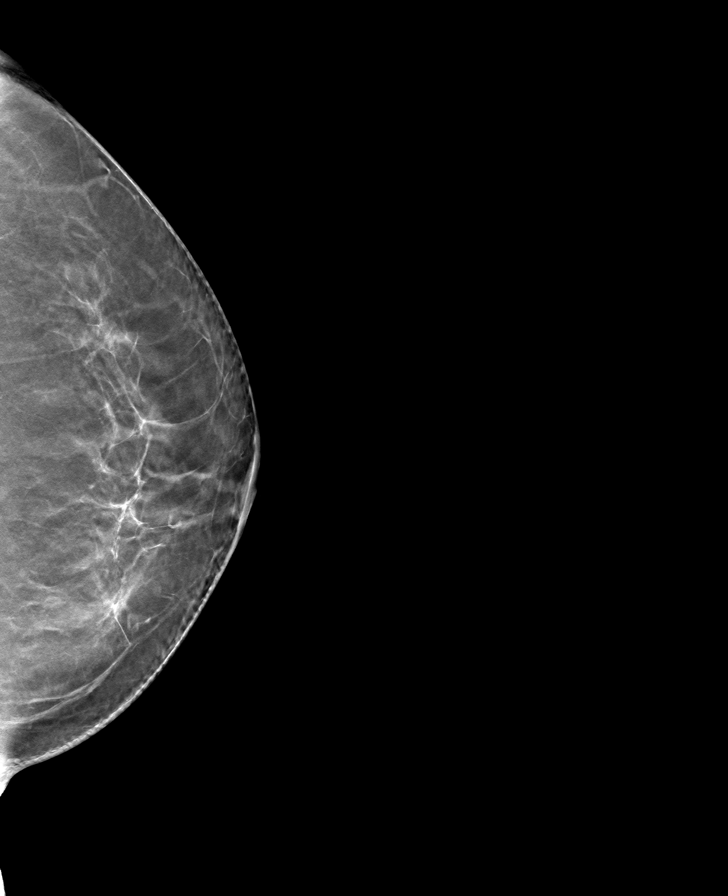

[R MLO tomo · tomo slice 35/70.0]
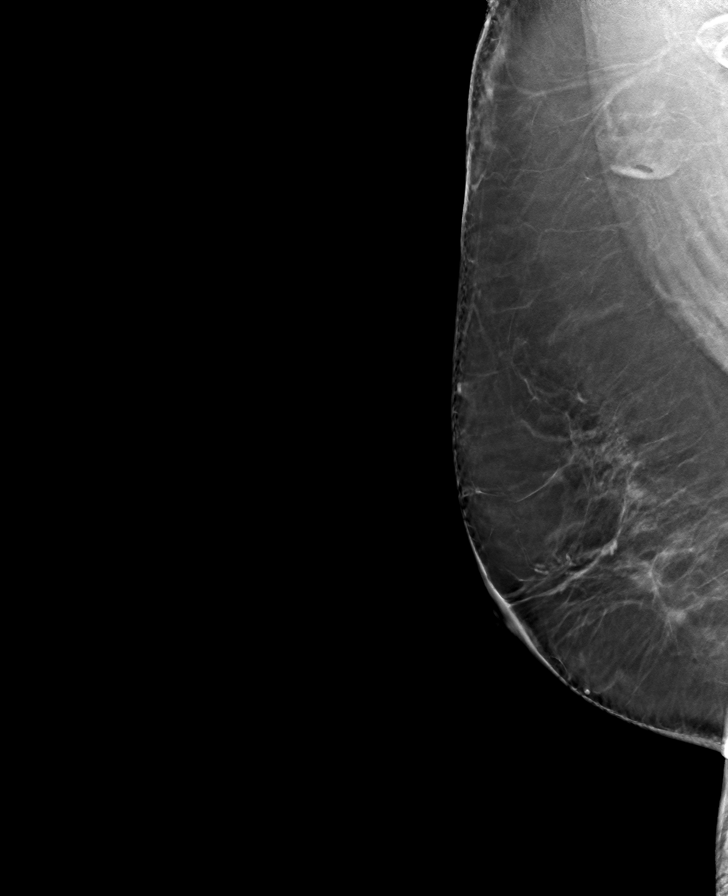

[8 of 24 positions shown; findings below may reference images not displayed]

ACR Breast Density Category b: There are scattered areas of
fibroglandular density.
FINDINGS: There are no findings suspicious for malignancy. The images were
evaluated with computer-aided detection.
IMPRESSION: No mammographic evidence of malignancy. A result letter of this
screening mammogram will be mailed directly to the patient.

RECOMMENDATION:
Screening mammogram in one year. (Code:ZP-[id number])

BI-RADS CATEGORY  1: Negative.

## 2023-01-18 ENCOUNTER — Ambulatory Visit: Payer: Self-pay | Admitting: *Deleted

## 2023-01-18 ENCOUNTER — Other Ambulatory Visit: Payer: Self-pay

## 2023-01-18 NOTE — Telephone Encounter (Signed)
Interpreter Denise ID # [id number] at beginning of call and call was disconnected . Called patient back with interpreter Boneta Lucks ID # [phone] Chief Complaint: upper abdominal pain after taking metformin medication. Requesting "another bottle of medication" feels this bottle is "defective" due to metformin has not caused abdominal pain before. Symptoms: upper abdominal pain nausea 20 minutes after taking metformin doses BID.  Frequency: yesterday  Pertinent Negatives: Patient denies chest pain no fever no V/D.  Disposition: [] ED /[] Urgent Care (no appt availability in office) / [] Appointment(In office/virtual)/ []  [facility]/ [] Home Care/ [] Refused Recommended Disposition /[] [facility]/ [x]  Follow-up with PCP Additional Notes:   No available appt to discuss medication and evaluate for abdominal pain. Patient reports she does take medication with food and since starting yesterday she becomes nauseated and upper abdominal pain after taking med. Requesting to get another bottle of medication "different bottle" of pills. Patient requesting a call back. Recommended if pain worsens go to mobile unit or UC/ED. Please advise        Reason for Disposition  [1] MILD pain (e.g., does not interfere with normal activities) AND [2] pain comes and goes (cramps) AND [3] present > 48 hours  (Exception: This same abdominal pain is a chronic symptom recurrent or ongoing AND present > 4 weeks.)  Answer Assessment - Initial Assessment Questions 1. LOCATION: "Where does it hurt?"      Upper belly button  2. RADIATION: "Does the pain shoot anywhere else?" (e.g., chest, back)     Na  3. ONSET: "When did the pain begin?" (e.g., minutes, hours or days ago)      After taking metformin medication  yesterday  4. SUDDEN: "Gradual or sudden onset?"     Started after taking metformin 5. PATTERN "Does the pain come and go, or is it constant?"    - If it comes and goes: "How long does it last?" "Do you  have pain now?"     (Note: Comes and goes means the pain is intermittent. It goes away completely between bouts.)    - If constant: "Is it getting better, staying the same, or getting worse?"      (Note: Constant means the pain never goes away completely; most serious pain is constant and gets worse.)      Occurs 20 minutes after taking metformin  6. SEVERITY: "How bad is the pain?"  (e.g., Scale 1-10; mild, moderate, or severe)    - MILD (1-3): Doesn't interfere with normal activities, abdomen soft and not tender to touch.     - MODERATE (4-7): Interferes with normal activities or awakens from sleep, abdomen tender to touch.     - SEVERE (8-10): Excruciating pain, doubled over, unable to do any normal activities.       Feels acute pain  7. RECURRENT SYMPTOM: "Have you ever had this type of stomach pain before?" If Yes, ask: "When was the last time?" and "What happened that time?"      No  8. CAUSE: "What do you think is causing the stomach pain?"     metformin 9. RELIEVING/AGGRAVATING FACTORS: "What makes it better or worse?" (e.g., antacids, bending or twisting motion, bowel movement)     Only drinks water  10. OTHER SYMPTOMS: "Do you have any other symptoms?" (e.g., back pain, diarrhea, fever, urination pain, vomiting)       Upper abdominal pain nausea 11. PREGNANCY: "Is there any chance you are pregnant?" "When was your last menstrual period?"  na  Protocols used: Abdominal Pain - Female-A-AH

## 2023-01-23 NOTE — Telephone Encounter (Signed)
Patient was given 90 day supply of metformin on 07/19/22 with 2 refills. Patient should be refilling on or before 11/28. Patient has appointment with provider on 02/17/2023. No earlier appointment available . Patient has been advised to go to mobile unit on tomorrow or [facility] ED if s/s worsen.

## 2023-01-31 ENCOUNTER — Other Ambulatory Visit: Payer: Self-pay

## 2023-02-01 ENCOUNTER — Other Ambulatory Visit: Payer: Self-pay

## 2023-02-08 ENCOUNTER — Encounter: Payer: Self-pay | Admitting: Dermatology

## 2023-02-08 ENCOUNTER — Ambulatory Visit: Payer: Self-pay | Admitting: Dermatology

## 2023-02-08 DIAGNOSIS — L814 Other melanin hyperpigmentation: Secondary | ICD-10-CM

## 2023-02-08 DIAGNOSIS — L300 Nummular dermatitis: Secondary | ICD-10-CM

## 2023-02-08 MED ORDER — SAFETY SEAL MISCELLANEOUS MISC
0 refills | Status: AC
Start: 2023-02-08 — End: ?

## 2023-02-08 NOTE — Patient Instructions (Addendum)
Hola Erin Huff,  Gracias por visitarnos hoy. Agradecemos su compromiso de [place] salud de su piel. A continuacin, se incluye un resumen de las instrucciones clave de la [place]:  - Crema con receta: comience a usar la crema recetada que contiene cido tranexmico, vitamina C, cido kjico y niacinamida. - Aplicacin: aplique dos veces al da, por la maana y por la noche. - Duracin: la receta es vlida por un ao. - Costo: la crema cuesta $45 y est disponible en una farmacia especfica que se comunicar con usted para Sales executive pago y Patent examiner.  - Crema de venta libre: hasta que pueda comprar la crema con receta, use la muestra de MelaB3 que se le proporciona. - Aplicacin: aplique dos veces al da, siguiendo la misma rutina que con la crema recetada.  - Protector solar: Hydrographic surveyor para proteger contra el oscurecimiento adicional de las [place]. Se le han proporcionado varias muestras para que las pruebe.  - Rutina: - Maana: lvese la cara, aplique la crema adecuada (MelaB3 o recetada), seguido del protector solar. - Noche: Lvese la cara y aplique la crema [place].  - Seguimiento: Nos volveremos a ver en seis meses para evaluar el progreso y comparar fotografas del antes y el despus.  Siga estas instrucciones cuidadosamente y no dude en comunicarse con nuestro consultorio si tiene alguna pregunta o inquietud.  Atentamente,  D[facility]       Hello Erin Huff,  Thank you for visiting [place] today. We appreciate your commitment to improving your skin health. Here is a summary of the key instructions from today's consultation:  - Prescription Cream: Begin using the prescribed cream containing tranexamic acid, vitamin C, kojic acid, and niacinamide.   - Application: Apply twice daily, in the morning and at night.   - Duration: The prescription is valid for one year.   - Cost: The cream is $45, available  from a specific pharmacy that will contact you for payment and address confirmation.  - Over-the-Counter Cream: Until you can purchase the prescription cream, use the MelaB3 sample provided.   - Application: Apply twice daily, following the same routine as the prescription cream.  - Sunscreen: Apply sunscreen daily to protect against further darkening of the spots. Several samples have been provided for you to try.  - Routine:   - Morning: Wash your face, apply the appropriate cream (MelaB3 or prescription), followed by sunscreen.   - Evening: Wash your face and apply the appropriate cream.  - Follow-Up: We will see you back in six months to assess progress and compare before and after pictures.  Please follow these instructions carefully, and do not hesitate to contact our office if you have any questions or concerns.  Best regards,  Dr. [facility]     Important Information  Due to recent changes in healthcare laws, you may see results of your pathology and/or laboratory studies on MyChart before the doctors have had a chance to review them. We understand that in some cases there may be results that are confusing or concerning to you. Please understand that not all results are received at the same time and often the doctors may need to interpret multiple results in order to provide you with the best plan of care or course of treatment. Therefore, we ask that you please give [place] 2 business days to thoroughly review all your results before contacting the office for clarification. Should we see a critical  lab result, you will be contacted sooner.   If You Need Anything After Your Visit  If you have any questions or concerns for your doctor, please call our main line at [phone] If no one answers, please leave a voicemail as directed and we will return your call as soon as possible. Messages left after 4 pm will be answered the following business day.   You may also  send [place] a message via MyChart. We typically respond to MyChart messages within 1-2 business days.  For prescription refills, please ask your pharmacy to contact our office. Our fax number is [phone].  If you have an urgent issue when the clinic is closed that cannot wait until the next business day, you can page your doctor at the number below.    Please note that while we do our best to be available for urgent issues outside of office hours, we are not available 24/7.   If you have an urgent issue and are unable to reach [place], you may choose to seek medical care at your doctor's office, retail clinic, urgent care center, or emergency room.  If you have a medical emergency, please immediately call 911 or go to the emergency department. In the event of inclement weather, please call our main line at [phone] for an update on the status of any delays or closures.  Dermatology Medication Tips: Please keep the boxes that topical medications come in in order to help keep track of the instructions about where and how to use these. Pharmacies typically print the medication instructions only on the boxes and not directly on the medication tubes.   If your medication is too expensive, please contact our office at [phone] or send [place] a message through MyChart.   We are unable to tell what your co-pay for medications will be in advance as this is different depending on your insurance coverage. However, we may be able to find a substitute medication at lower cost or fill out paperwork to get insurance to cover a needed medication.   If a prior authorization is required to get your medication covered by your insurance company, please allow [place] 1-2 business days to complete this process.  Drug prices often vary depending on where the prescription is filled and some pharmacies may offer cheaper prices.  The website www.goodrx.com contains coupons for medications through different pharmacies. The  prices here do not account for what the cost may be with help from insurance (it may be cheaper with your insurance), but the website can give you the price if you did not use any insurance.  - You can print the associated coupon and take it with your prescription to the pharmacy.  - You may also stop by our office during regular business hours and pick up a GoodRx coupon card.  - If you need your prescription sent electronically to a different pharmacy, notify our office through [facility] or by phone at [phone]

## 2023-02-08 NOTE — Progress Notes (Signed)
   Follow-Up Visit   Subjective  Erin Huff is a 67 y.o. female who presents for the following:  nummular dermatitis   Patient present today for follow up visit for nummular dermatitis. Patient was last evaluated on 08/09/22. Pt stated that she only had to apple the Clobetasol cream once and the areas went away and have not reappeared. Patient reports sxs are better. Patient denies medication changes.  The following portions of the chart were reviewed this encounter and updated as appropriate: medications, allergies, medical history  Review of Systems:  No other skin or systemic complaints except as noted in HPI or Assessment and Plan.  Objective  Well appearing patient in no apparent distress; mood and affect are within normal limits.   A focused examination was performed of the following areas: arms & legs   Relevant exam findings are noted in the Assessment and Plan.           Assessment & Plan    1. Facial Lentigos/Sun Spots - Assessment:  Patient presents with dark brown spots on the face, clinically assessed as lentigos or sun spots. These benign pigmented lesions are typically caused by cumulative sun exposure. No previous treatment for these lesions. - Plan:   - Prescribe custom compounded cream containing tranexamic acid, vitamin C, kojic acid, and niacinamide for daily use, morning and night.   - Provide samples of over-the-counter MelaB3 serum for use while awaiting prescription cream.   - Instruct on proper application: wash face, apply serum/cream, then sunscreen in the morning; wash face and apply serum/cream without sunscreen at night.   - Provide sunscreen samples.   - Schedule follow-up appointment in 6 months for comparison of lesions.   - Obtain clinical photographs for baseline documentation.  2. Nummular Dermatitis (Stable / Well Controlled) - Assessment: Patient reports previous spot of eczema has resolved completely. - Plan: Advise to  use topical steroid cream if eczema recurs.    No follow-ups on file.    Documentation: I have reviewed the above documentation for accuracy and completeness, and I agree with the above.   I, Shirron Marcha Solders, CMA, am acting as scribe for [employer], DO.   Langston Reusing, DO

## 2023-02-13 ENCOUNTER — Other Ambulatory Visit: Payer: Self-pay

## 2023-02-16 ENCOUNTER — Other Ambulatory Visit: Payer: Self-pay

## 2023-02-16 ENCOUNTER — Other Ambulatory Visit: Payer: Self-pay | Admitting: Nurse Practitioner

## 2023-02-16 DIAGNOSIS — I10 Essential (primary) hypertension: Secondary | ICD-10-CM

## 2023-02-17 ENCOUNTER — Encounter: Payer: Self-pay | Admitting: Nurse Practitioner

## 2023-02-17 ENCOUNTER — Other Ambulatory Visit: Payer: Self-pay

## 2023-02-17 ENCOUNTER — Ambulatory Visit: Payer: Self-pay | Attending: Nurse Practitioner | Admitting: Nurse Practitioner

## 2023-02-17 VITALS — BP 130/71 | HR 72 | Ht 60.0 in | Wt 171.2 lb

## 2023-02-17 DIAGNOSIS — I10 Essential (primary) hypertension: Secondary | ICD-10-CM

## 2023-02-17 DIAGNOSIS — K089 Disorder of teeth and supporting structures, unspecified: Secondary | ICD-10-CM

## 2023-02-17 DIAGNOSIS — E7800 Pure hypercholesterolemia, unspecified: Secondary | ICD-10-CM

## 2023-02-17 DIAGNOSIS — Z7984 Long term (current) use of oral hypoglycemic drugs: Secondary | ICD-10-CM

## 2023-02-17 DIAGNOSIS — E039 Hypothyroidism, unspecified: Secondary | ICD-10-CM

## 2023-02-17 DIAGNOSIS — E1165 Type 2 diabetes mellitus with hyperglycemia: Secondary | ICD-10-CM

## 2023-02-17 LAB — POCT GLYCOSYLATED HEMOGLOBIN (HGB A1C): Hemoglobin A1C: 8.1 % — AB (ref 4.0–5.6)

## 2023-02-17 MED ORDER — GLIPIZIDE ER 10 MG PO TB24
20.0000 mg | ORAL_TABLET | Freq: Every day | ORAL | 1 refills | Status: DC
  Filled 2023-06-26: qty 180, 90d supply, fill #0
  Filled 2023-11-13: qty 180, 90d supply, fill #1

## 2023-02-17 MED ORDER — CARVEDILOL 6.25 MG PO TABS
12.5000 mg | ORAL_TABLET | Freq: Two times a day (BID) | ORAL | 3 refills | Status: DC
Start: 2023-02-17 — End: 2023-02-18
  Filled 2023-02-17: qty 180, 45d supply, fill #0

## 2023-02-17 MED ORDER — EMPAGLIFLOZIN 25 MG PO TABS
25.0000 mg | ORAL_TABLET | Freq: Every day | ORAL | 1 refills | Status: DC
Start: 2023-02-17 — End: 2023-07-19
  Filled 2023-02-17 – 2023-07-18 (×2): qty 90, 90d supply, fill #0

## 2023-02-17 MED ORDER — METFORMIN HCL 1000 MG PO TABS
1000.0000 mg | ORAL_TABLET | Freq: Two times a day (BID) | ORAL | 2 refills | Status: DC
Start: 2023-02-17 — End: 2023-05-19
  Filled 2023-02-17 – 2023-03-02 (×2): qty 180, 90d supply, fill #0

## 2023-02-17 MED ORDER — ROSUVASTATIN CALCIUM 5 MG PO TABS
5.0000 mg | ORAL_TABLET | Freq: Every day | ORAL | 2 refills | Status: DC
Start: 2023-02-17 — End: 2023-10-18
  Filled 2023-06-26: qty 90, 90d supply, fill #0

## 2023-02-17 MED ORDER — SPIRONOLACTONE 25 MG PO TABS
25.0000 mg | ORAL_TABLET | Freq: Every day | ORAL | 1 refills | Status: DC
Start: 2023-02-17 — End: 2023-08-14
  Filled 2023-02-17: qty 90, 90d supply, fill #0
  Filled 2023-05-19: qty 90, 90d supply, fill #1

## 2023-02-17 NOTE — Progress Notes (Unsigned)
Assessment & Plan:  Erin Huff was seen today for medical management of chronic issues.  Diagnoses and all orders for this visit:  Uncontrolled type 2 diabetes mellitus with hyperglycemia, without long-term current use of insulin Add ozempic -     POCT glycosylated hemoglobin (Hb A1C) -     glipiZIDE (GLUCOTROL XL) 10 MG 24 hr tablet; Take 2 tablets (20 mg total) by mouth daily with breakfast. -     metFORMIN (GLUCOPHAGE) 1000 MG tablet; Take 1 tablet (1,000 mg total) by mouth 2 (two) times daily with a meal. -     empagliflozin (JARDIANCE) 25 MG TABS tablet; Take 1 tablet (25 mg total) by mouth daily before breakfast. For diabetes -     CMP14+EGFR -     Ambulatory referral to Ophthalmology  Primary hypertension -     spironolactone (ALDACTONE) 25 MG tablet; Take 1 tablet (25 mg total) by mouth daily. -     CMP14+EGFR -     Ambulatory referral to Ophthalmology -     carvedilol (COREG) 6.25 MG tablet; Take 1 tablet (6.25 mg total) by mouth 2 (two) times daily with a meal. Continue all antihypertensives as prescribed.  Reminded to bring in blood pressure log for follow  up appointment.  RECOMMENDATIONS: DASH/Mediterranean Diets are healthier choices for HTN.     Acquired hypothyroidism Continue current dose of levothyroxine -     Thyroid Panel With TSH  Pure hypercholesterolemia -     rosuvastatin (CRESTOR) 5 MG tablet; Take 1 tablet (5 mg total) by mouth daily. INSTRUCTIONS: Work on a low fat, heart healthy diet and participate in regular aerobic exercise program by working out at least 150 minutes per week; 5 days a week-30 minutes per day. Avoid red meat/beef/steak,  fried foods. junk foods, sodas, sugary drinks, unhealthy snacking, alcohol and smoking.  Drink at least 80 oz of water per day and monitor your carbohydrate intake daily.    Poor dentition -     Ambulatory referral to Dentistry    Patient has been counseled on age-appropriate routine health concerns for screening  and prevention. These are reviewed and up-to-date. Referrals have been placed accordingly. Immunizations are up-to-date or declined.    Subjective:   Chief Complaint  Patient presents with   Medical Management of Chronic Issues    Erin Huff 67 y.o. female presents to office today for follow up to HTN and DM  VRI was used to communicate directly with patient for the entire encounter including providing detailed patient instructions.    She has a past medical history of Allergy, Depression, DM2, GERD, Hyperlipidemia, Hypertension, Moderate to severe aortic stenosis Followed by Cardiology  (03/2011), Thoracic aortic aneurysm (04/06/2021), and Thyroid disease.    DM2 Diabetes is not well controlled. She is currently taking metformin, glipizide, jardiance as prescribed. Goal A1c <7.0 Needs to work on dietary adherence. Will start ozempic at next visit if no improvement.  Lab Results  Component Value Date   HGBA1C 8.1 (A) 02/17/2023    Lab Results  Component Value Date   HGBA1C 8.2 (A) 11/15/2022  LDL at goal  Lab Results  Component Value Date   LDLCALC 62 11/15/2022      Hypothyroidism Thyroid level at goal with levothyroxine 100 mg daily.  Lab Results  Component Value Date   TSH 1.400 02/17/2023     HTN Blood pressure well controlled with spironolactone 25 mg daily and carvedilol 12.5 mg BID.  BP Readings from Last 3 Encounters:  02/17/23 130/71  12/05/22 (!) 131/59  11/15/22 132/73    Review of Systems  Constitutional:  Negative for fever, malaise/fatigue and weight loss.  HENT: Negative.  Negative for nosebleeds.   Eyes: Negative.  Negative for blurred vision, double vision and photophobia.  Respiratory: Negative.  Negative for cough and shortness of breath.   Cardiovascular: Negative.  Negative for chest pain, palpitations and leg swelling.  Gastrointestinal: Negative.  Negative for heartburn, nausea and vomiting.  Musculoskeletal: Negative.  Negative  for myalgias.  Neurological: Negative.  Negative for dizziness, focal weakness, seizures and headaches.  Psychiatric/Behavioral: Negative.  Negative for suicidal ideas.     Past Medical History:  Diagnosis Date   Allergy    Depression    mild   Diabetes mellitus without complication (HCC)    GERD (gastroesophageal reflux disease)    Hyperlipidemia    Hypertension    Moderate to severe aortic stenosis 03/2011   Moderate to severe AS noted on echo with mean gradient 27 mmHg.  Also noted moderate dilation of the ascending aorta-46 mm.   Thoracic aortic aneurysm (HCC) 04/06/2021   Measuring 46 mm on Echo; CT angiogram chest-aorta 08/10/2021: 4.2 x 4.2 ascending aortic dilation.  Mildly enlarged heart.   Thyroid disease     Past Surgical History:  Procedure Laterality Date   NO PAST SURGERIES     TRANSTHORACIC ECHOCARDIOGRAM  04/05/2021   EF 60 to 65%.  GR 1 DD.  Normal RV size and mildly elevated RVSP.  Normal RAP.  Moderate to Severe Aortic Stenosis.  Mean gradient 27 mmHg.  Moderate dilation of the ascending aorta measuring 46 mm.  Recommend CTA    Family History  Problem Relation Age of Onset   Hypertension Sister    Diabetes Brother    Breast cancer Maternal Aunt    Colon cancer Neg Hx    Colon polyps Neg Hx    Esophageal cancer Neg Hx    Rectal cancer Neg Hx    Stomach cancer Neg Hx     Social History Reviewed with no changes to be made today.   Outpatient Medications Prior to Visit  Medication Sig Dispense Refill   Baclofen 5 MG TABS Take 1 tablet (5 mg total) by mouth at bedtime as needed. 10 tablet 0   Blood Glucose Monitoring Suppl (TRUE METRIX METER) w/Device KIT Use as directed 1 kit 0   glucose blood (TRUE METRIX BLOOD GLUCOSE TEST) test strip USE AS INSTRUCTED. 100 strip 3   levothyroxine (SYNTHROID) 100 MCG tablet Take 1 tablet (100 mcg total) by mouth daily before breakfast. 90 tablet 2   Safety Seal Miscellaneous MISC Melaxemic Cream with tranexamic acid 5%  kojic acid USP 2% vit c USP 2.5% hyaluronic acid EXCP 0.1% use on areas twice a day 15 g 0   TRUEplus Lancets 28G MISC Use as directed 100 each 5   carvedilol (COREG) 6.25 MG tablet Take 2 tablets (12.5 mg total) by mouth 2 (two) times daily. 180 tablet 3   empagliflozin (JARDIANCE) 25 MG TABS tablet Take 1 tablet (25 mg total) by mouth daily before breakfast. For diabetes 90 tablet 1   glipiZIDE (GLUCOTROL XL) 10 MG 24 hr tablet Take 2 tablets (20 mg total) by mouth daily with breakfast. 180 tablet 1   metFORMIN (GLUCOPHAGE) 1000 MG tablet Take 1 tablet (1,000 mg total) by mouth 2 (two) times daily with a meal. 180 tablet 2   rosuvastatin (CRESTOR) 5 MG tablet Take 1 tablet (5  mg total) by mouth daily. 90 tablet 2   spironolactone (ALDACTONE) 25 MG tablet Take 1 tablet (25 mg total) by mouth daily. 90 tablet 1   clobetasol cream (TEMOVATE) 0.05 % Apply 1 Application topically 2 (two) times daily. USE FOR 2 WEEKS THEN STOP (Patient not taking: Reported on 02/17/2023) 60 g 1   nitrofurantoin, macrocrystal-monohydrate, (MACROBID) 100 MG capsule Take 1 capsule (100 mg total) by mouth 2 (two) times daily. (Patient not taking: Reported on 02/17/2023) 10 capsule 0   No facility-administered medications prior to visit.    Allergies  Allergen Reactions   Motrin [Ibuprofen] Rash   Penicillins Rash       Objective:    BP 130/71 (BP Location: Left Arm, Patient Position: Sitting, Cuff Size: Normal)   Pulse 72   Ht 5' (1.524 m)   Wt 171 lb 3.2 oz (77.7 kg)   SpO2 98%   BMI 33.44 kg/m  Wt Readings from Last 3 Encounters:  02/17/23 171 lb 3.2 oz (77.7 kg)  12/05/22 168 lb (76.2 kg)  11/15/22 168 lb 12.8 oz (76.6 kg)    Physical Exam Vitals and nursing note reviewed.  Constitutional:      Appearance: She is well-developed.  HENT:     Head: Normocephalic and atraumatic.  Cardiovascular:     Rate and Rhythm: Normal rate and regular rhythm.     Heart sounds: Murmur heard.     No friction  rub. No gallop.  Pulmonary:     Effort: Pulmonary effort is normal. No tachypnea or respiratory distress.     Breath sounds: Normal breath sounds. No decreased breath sounds, wheezing, rhonchi or rales.  Chest:     Chest wall: No tenderness.  Abdominal:     General: Bowel sounds are normal.     Palpations: Abdomen is soft.  Musculoskeletal:        General: Normal range of motion.     Cervical back: Normal range of motion.  Skin:    General: Skin is warm and dry.  Neurological:     Mental Status: She is alert and oriented to person, place, and time.     Coordination: Coordination normal.  Psychiatric:        Behavior: Behavior normal. Behavior is cooperative.        Thought Content: Thought content normal.        Judgment: Judgment normal.          Patient has been counseled extensively about nutrition and exercise as well as the importance of adherence with medications and regular follow-up. The patient was given clear instructions to go to ER or return to medical center if symptoms don't improve, worsen or new problems develop. The patient verbalized understanding.   Follow-up: Return in about 3 months (around 05/18/2023).   Claiborne Rigg, FNP-BC [facility] and [facility] [place], [place] [phone]   02/18/2023, 11:12 PM

## 2023-02-17 NOTE — Patient Instructions (Addendum)
03/03/2023 12:45 PM [facility] ECHO 3 [facility] [address] [id number]    [facility] Address: [address], [place], [place] [address] Phone: [phone]

## 2023-02-18 ENCOUNTER — Encounter: Payer: Self-pay | Admitting: Nurse Practitioner

## 2023-02-18 LAB — CMP14+EGFR
ALT: 10 [IU]/L (ref 0–32)
AST: 14 [IU]/L (ref 0–40)
Albumin: 4.3 g/dL (ref 3.9–4.9)
Alkaline Phosphatase: 132 [IU]/L — ABNORMAL HIGH (ref 44–121)
BUN/Creatinine Ratio: 17 (ref 12–28)
BUN: 12 mg/dL (ref 8–27)
Bilirubin Total: 0.3 mg/dL (ref 0.0–1.2)
CO2: 21 mmol/L (ref 20–29)
Calcium: 10 mg/dL (ref 8.7–10.3)
Chloride: 100 mmol/L (ref 96–106)
Creatinine, Ser: 0.69 mg/dL (ref 0.57–1.00)
Globulin, Total: 2.8 g/dL (ref 1.5–4.5)
Glucose: 286 mg/dL — ABNORMAL HIGH (ref 70–99)
Potassium: 4.6 mmol/L (ref 3.5–5.2)
Sodium: 136 mmol/L (ref 134–144)
Total Protein: 7.1 g/dL (ref 6.0–8.5)
eGFR: 96 mL/min/{1.73_m2} (ref >59)

## 2023-02-18 LAB — THYROID PANEL WITH TSH
Free Thyroxine Index: 2.4 (ref 1.2–4.9)
T3 Uptake Ratio: 27 % (ref 24–39)
T4, Total: 8.9 ug/dL (ref 4.5–12.0)
TSH: 1.4 u[IU]/mL (ref 0.450–4.500)

## 2023-02-18 MED ORDER — CARVEDILOL 6.25 MG PO TABS
6.2500 mg | ORAL_TABLET | Freq: Two times a day (BID) | ORAL | 3 refills | Status: DC
  Filled 2023-02-18 – 2023-03-29 (×2): qty 180, 90d supply, fill #0
  Filled 2023-06-30: qty 180, 90d supply, fill #1
  Filled 2023-09-26: qty 180, 90d supply, fill #2
  Filled 2023-12-21: qty 180, 90d supply, fill #3

## 2023-02-20 ENCOUNTER — Other Ambulatory Visit: Payer: Self-pay

## 2023-02-23 ENCOUNTER — Other Ambulatory Visit: Payer: Self-pay

## 2023-02-27 ENCOUNTER — Other Ambulatory Visit: Payer: Self-pay

## 2023-03-02 ENCOUNTER — Other Ambulatory Visit: Payer: Self-pay

## 2023-03-03 ENCOUNTER — Other Ambulatory Visit: Payer: Self-pay

## 2023-03-03 ENCOUNTER — Ambulatory Visit (HOSPITAL_COMMUNITY): Payer: Self-pay

## 2023-03-10 ENCOUNTER — Other Ambulatory Visit: Payer: Self-pay

## 2023-03-13 ENCOUNTER — Other Ambulatory Visit: Payer: Self-pay

## 2023-03-13 ENCOUNTER — Ambulatory Visit (HOSPITAL_COMMUNITY)
Admission: EM | Admit: 2023-03-13 | Discharge: 2023-03-13 | Disposition: A | Discharge status: Home / Self Care | Payer: Self-pay | Attending: Internal Medicine | Admitting: Internal Medicine

## 2023-03-13 ENCOUNTER — Encounter (HOSPITAL_COMMUNITY): Payer: Self-pay | Admitting: Emergency Medicine

## 2023-03-13 DIAGNOSIS — N3090 Cystitis, unspecified without hematuria: Secondary | ICD-10-CM

## 2023-03-13 LAB — POCT URINALYSIS DIP (MANUAL ENTRY)
Bilirubin, UA: NEGATIVE
Glucose, UA: 500 mg/dL — AB
Ketones, POC UA: NEGATIVE mg/dL
Leukocytes, UA: NEGATIVE
Nitrite, UA: NEGATIVE
Protein Ur, POC: NEGATIVE mg/dL
Spec Grav, UA: 1.01
Urobilinogen, UA: 0.2 U/dL
pH, UA: 6

## 2023-03-13 MED ORDER — PHENAZOPYRIDINE HCL 200 MG PO TABS: 200.0000 mg | ORAL_TABLET | Freq: Three times a day (TID) | ORAL | 3 refills | Status: AC

## 2023-03-13 NOTE — Discharge Instructions (Addendum)
Urinalysis shows large amounts of sugar in the urine but no signs of infection. The symptoms appear to be from irritation in the bladder which could be from the diabetes. We can treat this with the following:  Phenazopyridine 1 tablet 3 times daily for 2-3 days until symptoms resolve.  Drink plenty of water. Keep appointment with primary care provider that manages your diabetes. Return to urgent care or PCP if symptoms worsen or fail to resolve.

## 2023-03-13 NOTE — ED Triage Notes (Signed)
Patient complains of burning with urination. denies abdominal pain or lower back pain.  Patient noticed having to urinate a little more often.   Has not had any medications.  Has been trying to drink more water.    Symptoms for 5 days.  May feel it one day and then not the next.  Patient has a old, faded, empty pill bottle labeled as phenazopyridine 100mg , tid- reports these medicines helped when prescribed years ago-2021.

## 2023-03-13 NOTE — ED Provider Notes (Signed)
[facility]    CSN: [id number] Arrival date & time: 03/13/23  1210      History   Chief Complaint Chief Complaint  Patient presents with   Urinary Tract Infection    HPI Erin Huff is a 68 y.o. female.     5 days burning. Comes and goes. Denies abd pain/back pain.    Urinary Tract Infection Associated symptoms: no abdominal pain, no fever and no vomiting     Past Medical History:  Diagnosis Date   Allergy    Depression    mild   Diabetes mellitus without complication (HCC)    GERD (gastroesophageal reflux disease)    Hyperlipidemia    Hypertension    Moderate to severe aortic stenosis 03/2011   Moderate to severe AS noted on echo with mean gradient 27 mmHg.  Also noted moderate dilation of the ascending aorta-46 mm.   Thoracic aortic aneurysm (HCC) 04/06/2021   Measuring 46 mm on Echo; CT angiogram chest-aorta 08/10/2021: 4.2 x 4.2 ascending aortic dilation.  Mildly enlarged heart.   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  GR 1 DD.  Normal RV size and mildly elevated RVSP.  Normal RAP.  Moderate to Severe Aortic Stenosis.  Mean gradient 27 mmHg.  Moderate dilation of the ascending aorta measuring 46 mm.   Recommend CTA    OB History     Gravida  5   Para      Term      Preterm      AB      Living  5      SAB      IAB      Ectopic      Multiple      Live Births  5            Home Medications    Prior to Admission medications   Medication Sig Start Date End Date Taking? Authorizing Provider  Baclofen 5 MG TABS Take 1 tablet (5 mg total) by mouth at bedtime as needed. Patient not taking: Reported on 03/13/2023 10/05/22   Raspet, Noberto Retort, PA-C  Blood Glucose Monitoring Suppl (TRUE METRIX METER) w/Device KIT Use as directed 06/14/16   Quentin Angst, MD  carvedilol (COREG) 6.25 MG tablet Take 1 tablet (6.25 mg total) by mouth 2 (two) times daily with a meal. 02/18/23   Claiborne Rigg, NP  clobetasol cream (TEMOVATE) 0.05 % Apply 1 Application topically 2 (two) times daily. USE FOR 2 WEEKS THEN STOP Patient not taking: Reported on 02/17/2023 08/09/22   Terri Piedra, DO  empagliflozin (JARDIANCE) 25 MG TABS tablet Take 1 tablet (25 mg total) by mouth daily before breakfast. For diabetes 02/17/23   Claiborne Rigg, NP  glipiZIDE (GLUCOTROL XL) 10 MG 24 hr tablet Take 2 tablets (20 mg total) by mouth daily with breakfast. 02/17/23   Claiborne Rigg, NP  glucose blood (TRUE METRIX BLOOD GLUCOSE TEST) test strip USE AS INSTRUCTED. 10/18/22 10/18/23  Hoy Register, MD  levothyroxine (SYNTHROID) 100 MCG tablet Take 1 tablet (100 mcg total) by mouth daily before breakfast. 11/15/22   Claiborne Rigg, NP  metFORMIN (GLUCOPHAGE) 1000 MG tablet Take 1 tablet (1,000 mg total) by mouth 2 (two) times daily with a meal. 02/17/23   Claiborne Rigg, NP  rosuvastatin (CRESTOR) 5 MG tablet Take 1 tablet (5 mg total) by mouth daily. 02/17/23   Claiborne Rigg, NP  Safety Seal Miscellaneous MISC Melaxemic Cream with tranexamic acid 5% kojic acid USP 2% vit c USP 2.5% hyaluronic acid EXCP 0.1% use on areas twice a day 02/08/23   Terri Piedra, DO  spironolactone (ALDACTONE) 25  MG tablet Take 1 tablet (25 mg total) by mouth daily. 02/17/23   Claiborne Rigg, NP  TRUEplus Lancets 28G MISC Use as directed 09/17/21   Hoy Register, MD    Family History Family History  Problem Relation Age of Onset   Hypertension Sister    Diabetes Brother    Breast cancer Maternal Aunt    Colon cancer Neg Hx    Colon polyps Neg Hx    Esophageal cancer Neg Hx    Rectal cancer Neg Hx    Stomach cancer Neg Hx     Social History Social History   Tobacco Use   Smoking status: Never   Smokeless tobacco: Never  Vaping Use   Vaping status: Never Used  Substance Use Topics   Alcohol use: No    Comment: rare    Drug use: No     Allergies   Motrin [ibuprofen] and Penicillins   Review of Systems Review of Systems  Constitutional:  Negative for chills and fever.  HENT:  Negative for ear pain and sore throat.   Eyes:  Negative for pain and visual disturbance.  Respiratory:  Negative for cough and shortness of breath.   Cardiovascular:  Negative for chest pain and palpitations.  Gastrointestinal:  Negative for abdominal pain and vomiting.  Genitourinary:  Positive for dysuria and frequency. Negative for hematuria and urgency.  Musculoskeletal:  Negative for arthralgias and back pain.  Skin:  Negative for color change and rash.  Neurological:  Negative for seizures and syncope.  All other systems reviewed and are negative.    Physical Exam Triage Vital Signs ED Triage Vitals  Encounter Vitals Group     BP 03/13/23 1508 (!) 152/71     Systolic BP Percentile --      Diastolic BP Percentile --      Pulse Rate 03/13/23 1508 60     Resp 03/13/23 1508 18     Temp 03/13/23 1508 98.5 F (36.9 C)     Temp Source 03/13/23 1508 Oral     SpO2 03/13/23 1508 97 %     Weight --      Height --      Head Circumference --      Peak Flow --      Pain Score 03/13/23 1502 5     Pain Loc --      Pain Education --      Exclude from Growth Chart --    No data  found.  Updated Vital Signs BP (!) 152/71 (BP Location: Right Arm)  Pulse 60   Temp 98.5 F (36.9 C) (Oral)   Resp 18   SpO2 97%   Visual Acuity Right Eye Distance:   Left Eye Distance:   Bilateral Distance:    Right Eye Near:   Left Eye Near:    Bilateral Near:     Physical Exam Vitals and nursing note reviewed.  Constitutional:      General: She is not in acute distress.    Appearance: She is well-developed.  HENT:     Head: Normocephalic and atraumatic.  Eyes:     Conjunctiva/sclera: Conjunctivae normal.  Cardiovascular:     Rate and Rhythm: Normal rate and regular rhythm.     Heart sounds: Murmur heard.  Pulmonary:     Effort: Pulmonary effort is normal. No respiratory distress.     Breath sounds: Normal breath sounds.  Abdominal:     Palpations: Abdomen is soft.     Tenderness: There is abdominal tenderness (very mild left side).  Musculoskeletal:        General: No swelling.     Cervical back: Neck supple.  Skin:    General: Skin is warm and dry.     Capillary Refill: Capillary refill takes less than 2 seconds.  Neurological:     Mental Status: She is alert.  Psychiatric:        Mood and Affect: Mood normal.      UC Treatments / Results  Labs (all labs ordered are listed, but only abnormal results are displayed) Labs Reviewed  POCT URINALYSIS DIP (MANUAL ENTRY)    EKG   Radiology No results found.  Procedures Procedures (including critical care time)  Medications Ordered in UC Medications - No data to display  Initial Impression / Assessment and Plan / UC Course  I have reviewed the triage vital signs and the nursing notes.  Pertinent labs & imaging results that were available during my care of the patient were reviewed by me and considered in my medical decision making (see chart for details).     Cystitis   Urinalysis shows large amounts of sugar in the urine but no signs of infection. The symptoms appear to be from irritation in  the bladder which could be from the diabetes. We can treat this with the following:  Phenazopyridine 1 tablet 3 times daily for 2-3 days until symptoms resolve.  Drink plenty of water. Keep appointment with primary care provider that manages your diabetes. Return to urgent care or PCP if symptoms worsen or fail to resolve.    Final Clinical Impressions(s) / UC Diagnoses   Final diagnoses:  None   Discharge Instructions   None    ED Prescriptions   None    PDMP not reviewed this encounter.   Landis Martins, [place] 03/13/23 1540

## 2023-03-20 ENCOUNTER — Other Ambulatory Visit: Payer: Self-pay

## 2023-03-22 ENCOUNTER — Ambulatory Visit (HOSPITAL_COMMUNITY): Payer: Self-pay | Attending: Cardiology

## 2023-03-22 DIAGNOSIS — I350 Nonrheumatic aortic (valve) stenosis: Secondary | ICD-10-CM | POA: Insufficient documentation

## 2023-03-22 DIAGNOSIS — I77810 Thoracic aortic ectasia: Secondary | ICD-10-CM | POA: Insufficient documentation

## 2023-03-23 HISTORY — PX: TRANSTHORACIC ECHOCARDIOGRAM: SHX275

## 2023-03-23 LAB — ECHOCARDIOGRAM COMPLETE
AR max vel: 0.86 cm2
AV Area VTI: 0.88 cm2
AV Area mean vel: 0.76 cm2
AV Mean grad: 29 mm[Hg]
AV Peak grad: 45.4 mm[Hg]
Ao pk vel: 3.37 m/s
Area-P 1/2: 3.65 cm2
S' Lateral: 2.5 cm

## 2023-03-24 NOTE — Progress Notes (Signed)
Echocardiogram results: Good news, overall findings are pretty stable compared to the last year and the year before.  With these results, I think we can probably go to monitoring every other year.  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  Thankfully, there is only mild calcification.  The level of narrowing/stenosis is only considered to be moderate. ->  We will continue to follow every couple years.  It is stable from last 2 years..  There also was evidence of some dilation of the ascending aorta which probably has to do with this valve disease.  This will be something we also follow, but appears to be stable from last year.  Overall, this is good news.  This shows that the progression is not rapid.  The rest of the heart looks good.  Bryan Lemma, MD

## 2023-03-27 ENCOUNTER — Other Ambulatory Visit: Payer: Self-pay

## 2023-03-29 ENCOUNTER — Other Ambulatory Visit: Payer: Self-pay

## 2023-04-08 ENCOUNTER — Ambulatory Visit (HOSPITAL_COMMUNITY)
Admission: EM | Admit: 2023-04-08 | Discharge: 2023-04-08 | Disposition: A | Discharge status: Home / Self Care | Payer: Self-pay | Attending: Emergency Medicine | Admitting: Emergency Medicine

## 2023-04-08 ENCOUNTER — Encounter (HOSPITAL_COMMUNITY): Payer: Self-pay

## 2023-04-08 DIAGNOSIS — R300 Dysuria: Secondary | ICD-10-CM

## 2023-04-08 DIAGNOSIS — M545 Low back pain, unspecified: Secondary | ICD-10-CM

## 2023-04-08 LAB — POCT URINALYSIS DIP (MANUAL ENTRY)
Bilirubin, UA: NEGATIVE
Glucose, UA: 500 mg/dL — AB
Ketones, POC UA: NEGATIVE mg/dL
Leukocytes, UA: NEGATIVE
Nitrite, UA: NEGATIVE
Protein Ur, POC: NEGATIVE mg/dL
Spec Grav, UA: 1.01 (ref 1.010–1.025)
Urobilinogen, UA: 0.2 U/dL
pH, UA: 6.5 (ref 5.0–8.0)

## 2023-04-08 MED ORDER — LIDOCAINE 5 % EX PTCH
1.0000 | MEDICATED_PATCH | CUTANEOUS | 0 refills | Status: AC
Start: 2023-04-08 — End: ?

## 2023-04-08 MED ORDER — PHENAZOPYRIDINE HCL 100 MG PO TABS: 100.0000 mg | ORAL_TABLET | Freq: Three times a day (TID) | ORAL | 0 refills | Status: DC | PRN

## 2023-04-08 NOTE — ED Triage Notes (Signed)
Patient here today with c/o pain in urination and LB pain. Patient was here last month with the same symptoms. Patient states that the medication helped for a few days but the symptoms returned. She has increased pain in the night when she has to get up from the bed.

## 2023-04-08 NOTE — Discharge Instructions (Addendum)
I have prescribed more prednisone that you can take up to 3 times daily as needed for urinary pain.  Your urine culture results will come back over the next few days and someone will call if results are positive and require treatment.  Otherwise I would recommend following up with urology or your primary care provider for further evaluation if symptoms persists.  I have also prescribed lidocaine patches that you can apply to your back as needed for pain.  Otherwise I would recommend taking Tylenol as needed for pain.  You can also apply heat to help with pain.  Return here as needed.  Le he recetado ms prednisona que puede tomar [employer] 3 veces al da segn sea necesario para el dolor urinario.  Los resultados de su urocultivo estarn disponibles en los prximos das y alguien lo llamar si los resultados son positivos y requieren TEFL teacher.  De lo contrario, recomendara hacer un seguimiento con un urlogo o con su proveedor de atencin primaria para una evaluacin adicional si los sntomas persisten.  Tambin le recet parches de lidocana que puede aplicar en la espalda segn sea necesario para el dolor.  De lo contrario, recomendara tomar Tylenol segn sea necesario para [employer] Technology [employer].  Tambin puedes aplicar calor para ayudar con el dolor.  Regrese aqu segn sea necesario.

## 2023-04-09 NOTE — ED Provider Notes (Signed)
[facility]    CSN: [id number] Arrival date & time: 04/08/23  1339      History   Chief Complaint Chief Complaint  Patient presents with   Dysuria    HPI Erin Huff is a 68 y.o. female.   Patient presents with dysuria and low back pain. Patient was seen about a month ago for similar symptoms and reports Pyridium helped with her urinary discomfort, but once she ran out she began to have symptoms again. Denies flank pain, abdominal pain, fever, vaginal discharge, and vaginal bleeding.    Dysuria   Past Medical History:  Diagnosis Date   Allergy    Depression    mild   Diabetes mellitus without complication (HCC)    GERD (gastroesophageal reflux disease)    Hyperlipidemia    Hypertension    Moderate to severe aortic stenosis 03/2011   Moderate to severe AS noted on echo with mean gradient 27 mmHg.  Also noted moderate dilation of the ascending aorta-46 mm.   Thoracic aortic aneurysm (HCC) 04/06/2021   Measuring 46 mm on Echo; CT angiogram chest-aorta 08/10/2021: 4.2 x 4.2 ascending aortic dilation.  Mildly enlarged heart.   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  GR 1 DD.  Normal RV size and mildly elevated RVSP.   Normal RAP.  Moderate to Severe Aortic Stenosis.  Mean gradient 27 mmHg.  Moderate dilation of the ascending aorta measuring 46 mm.  Recommend CTA    OB History     Gravida  5   Para      Term      Preterm      AB      Living  5      SAB      IAB      Ectopic      Multiple      Live Births  5            Home Medications    Prior to Admission medications   Medication Sig Start Date End Date Taking? Authorizing Provider  lidocaine (LIDODERM) 5 % Place 1 patch onto the skin daily. Remove & Discard patch within 12 hours or as directed by MD 04/08/23  Yes Wynonia Lawman A, NP  phenazopyridine (PYRIDIUM) 100 MG tablet Take 1 tablet (100 mg total) by mouth 3 (three) times daily as needed for pain. 04/08/23  Yes Susann Givens, Raykwon Hobbs A, NP  Baclofen 5 MG TABS Take 1 tablet (5 mg total) by mouth at bedtime as needed. Patient not taking: Reported on 03/13/2023 10/05/22   Raspet, Noberto Retort, PA-C  Blood Glucose Monitoring Suppl (TRUE METRIX METER) w/Device KIT Use as directed 06/14/16   Quentin Angst, MD  carvedilol (COREG) 6.25 MG tablet Take 1 tablet (  6.25 mg total) by mouth 2 (two) times daily with a meal. 02/18/23   Claiborne Rigg, NP  clobetasol cream (TEMOVATE) 0.05 % Apply 1 Application topically 2 (two) times daily. USE FOR 2 WEEKS THEN STOP Patient not taking: Reported on 02/17/2023 08/09/22   Terri Piedra, DO  empagliflozin (JARDIANCE) 25 MG TABS tablet Take 1 tablet (25 mg total) by mouth daily before breakfast. For diabetes 02/17/23   Claiborne Rigg, NP  glipiZIDE (GLUCOTROL XL) 10 MG 24 hr tablet Take 2 tablets (20 mg total) by mouth daily with breakfast. 02/17/23   Claiborne Rigg, NP  glucose blood (TRUE METRIX BLOOD GLUCOSE TEST) test strip USE AS INSTRUCTED. 10/18/22 10/18/23  Hoy Register, MD  levothyroxine (SYNTHROID) 100 MCG tablet Take 1 tablet (100 mcg total) by mouth daily before breakfast. 11/15/22   Claiborne Rigg, NP  metFORMIN (GLUCOPHAGE) 1000  MG tablet Take 1 tablet (1,000 mg total) by mouth 2 (two) times daily with a meal. 02/17/23   Claiborne Rigg, NP  rosuvastatin (CRESTOR) 5 MG tablet Take 1 tablet (5 mg total) by mouth daily. 02/17/23   Claiborne Rigg, NP  Safety Seal Miscellaneous MISC Melaxemic Cream with tranexamic acid 5% kojic acid USP 2% vit c USP 2.5% hyaluronic acid EXCP 0.1% use on areas twice a day 02/08/23   Terri Piedra, DO  spironolactone (ALDACTONE) 25 MG tablet Take 1 tablet (25 mg total) by mouth daily. 02/17/23   Claiborne Rigg, NP  TRUEplus Lancets 28G MISC Use as directed 09/17/21   Hoy Register, MD    Family History Family History  Problem Relation Age of Onset   Hypertension Sister    Diabetes Brother    Breast cancer Maternal Aunt    Colon cancer Neg Hx    Colon polyps Neg Hx    Esophageal cancer Neg Hx    Rectal cancer Neg Hx    Stomach cancer Neg Hx     Social History Social History   Tobacco Use   Smoking status: Never   Smokeless tobacco: Never  Vaping Use   Vaping status: Never Used  Substance Use Topics   Alcohol use: No    Comment: rare    Drug use: No     Allergies   Motrin [ibuprofen] and Penicillins   Review of Systems Review of Systems  Genitourinary:  Positive for dysuria.   Per HPI  Physical Exam Triage Vital Signs ED Triage Vitals  Encounter Vitals Group     BP 04/08/23 1548 (!) 148/81     Systolic BP Percentile --      Diastolic BP Percentile --      Pulse Rate 04/08/23 1548 80     Resp 04/08/23 1548 16     Temp 04/08/23 1548 99.7 F (37.6 C)     Temp Source 04/08/23 1548 Oral     SpO2 04/08/23 1548 97 %     Weight --      Height --      Head Circumference --      Peak Flow --      Pain Score 04/08/23 1545 5     Pain Loc --      Pain Education --      Exclude from Growth Chart --    No data found.  Updated Vital Signs BP (!) 148/81 (BP Location: Right Arm)   Pulse 80   Temp 99.7 F (37.6 C) (Oral)   Resp 16  SpO2 97%    Visual Acuity Right Eye Distance:   Left Eye Distance:   Bilateral Distance:    Right Eye Near:   Left Eye Near:    Bilateral Near:     Physical Exam Vitals and nursing note reviewed.  Constitutional:      General: She is awake. She is not in acute distress.    Appearance: Normal appearance. She is well-developed and well-groomed. She is not ill-appearing.  Abdominal:     General: Abdomen is protuberant. Bowel sounds are normal.     Palpations: Abdomen is soft.     Tenderness: There is no abdominal tenderness. There is no right CVA tenderness or left CVA tenderness.  Musculoskeletal:     Cervical back: Normal.     Thoracic back: Normal.     Lumbar back: Tenderness present. No swelling or bony tenderness. Normal range of motion.  Skin:    General: Skin is warm and dry.  Neurological:     Mental Status: She is alert.  Psychiatric:        Behavior: Behavior is cooperative.      UC Treatments / Results  Labs (all labs ordered are listed, but only abnormal results are displayed) Labs Reviewed  POCT URINALYSIS DIP (MANUAL ENTRY) - Abnormal; Notable for the following components:      Result Value   Glucose, UA =500 (*)    Blood, UA trace-intact (*)    All other components within normal limits  URINE CULTURE    EKG   Radiology No results found.  Procedures Procedures (including critical care time)  Medications Ordered in UC Medications - No data to display  Initial Impression / Assessment and Plan / UC Course  I have reviewed the triage vital signs and the nursing notes.  Pertinent labs & imaging results that were available during my care of the patient were reviewed by me and considered in my medical decision making (see chart for details).     Patient presented with dysuria and right low back pain for a few weeks. Patient was previously seen for same symptoms and given Pyridium with relief.  Upon assessment nontender to abdomen upon palpation. Without  CVA tenderness. Endorses some tenderness to right lower back upon palpation and worsening pain with movement. Likely musculoskeletal. UA did not reveal any obvious signs of infection, will send culture to confirm.  Prescribed Pyridium for urinary pain. Prescribed lidocaine patches for low back pain. Discussed follow-up and return precautions.  Final Clinical Impressions(s) / UC Diagnoses   Final diagnoses:  Dysuria  Acute right-sided low back pain without sciatica     Discharge Instructions      I have prescribed more prednisone that you can take up to 3 times daily as needed for urinary pain.  Your urine culture results will come back over the next few days and someone will call if results are positive and require treatment.  Otherwise I would recommend following up with urology or your primary care provider for further evaluation if symptoms persists.  I have also prescribed lidocaine patches that you can apply to your back as needed for pain.  Otherwise I would recommend taking Tylenol as needed for pain.  You can also apply heat to help with pain.  Return here as needed.  Le he recetado ms prednisona que puede tomar [employer] 3 veces al da segn sea necesario para el dolor urinario.  Los resultados de su urocultivo estarn disponibles en los prximos [address]  [phone] y alguien  lo llamar si los resultados son positivos y requieren TEFL teacher.  De lo contrario, recomendara hacer un seguimiento con un urlogo o con su proveedor de atencin primaria para una evaluacin adicional si los sntomas persisten.  Tambin le recet parches de lidocana que puede aplicar en la espalda segn sea necesario para el dolor.  De lo contrario, recomendara tomar Tylenol segn sea necesario para [employer].  Tambin puedes aplicar calor para ayudar con el dolor.  Regrese aqu segn sea necesario.    ED Prescriptions     Medication Sig Dispense Auth. Provider   lidocaine (LIDODERM) 5 % Place 1 patch onto the skin daily.  Remove & Discard patch within 12 hours or as directed by MD 30 patch Wynonia Lawman A, NP   phenazopyridine (PYRIDIUM) 100 MG tablet Take 1 tablet (100 mg total) by mouth 3 (three) times daily as needed for pain. 10 tablet Wynonia Lawman A, NP      PDMP not reviewed this encounter.   Wynonia Lawman A, NP 04/09/23 1027

## 2023-04-10 LAB — URINE CULTURE: Culture: NO GROWTH

## 2023-04-11 ENCOUNTER — Other Ambulatory Visit: Payer: Self-pay

## 2023-04-21 ENCOUNTER — Telehealth: Payer: Self-pay | Admitting: *Deleted

## 2023-04-21 NOTE — Telephone Encounter (Signed)
 Using an interpreter left message for patient call back for results of echo

## 2023-04-21 NOTE — Telephone Encounter (Signed)
-----   Message from Bryan Lemma sent at 03/24/2023 11:03 PM EST ----- Echocardiogram results: Good news, overall findings are pretty stable compared to the last year and the year before.  With these results, I think we can probably go to monitoring every other year.  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  Thankfully, there is only mild calcification.  The level of narrowing/stenosis is only considered to be moderate. ->  We will continue to follow every couple years.  It is stable from last 2 years..  There also was evidence of some dilation of the ascending aorta which probably has to do with this valve disease.  This will be something we also follow, but appears to be stable from last year.  Overall, this is good news.  This shows that the progression is not rapid.  The rest of the heart looks good.  Bryan Lemma, MD

## 2023-04-26 ENCOUNTER — Other Ambulatory Visit: Payer: Self-pay

## 2023-05-09 NOTE — Telephone Encounter (Signed)
Daughter is returning call. Please advise

## 2023-05-09 NOTE — Telephone Encounter (Signed)
 The patient presented to the office after hours with her daughter to receive her imaging results.  I shared that I would request someone contact her today with an update.

## 2023-05-09 NOTE — Telephone Encounter (Signed)
Left message for pt daughter to call  ?

## 2023-05-09 NOTE — Telephone Encounter (Signed)
 Called and spoke to pt regarding Echo results.  All results and recommendations reviewed with pt in [facility].  Pt asking if she will need to see Dr. Herbie Baltimore again; advised her to call back in late August/early Sept for a Nov follow up appointment.   She is also asking about the CT Angio Chest Aorta [facility] & OR wo/CM (due in Jan 2025).  She states she now has the financial letter that will help with covering this test.  Staff msg sent to [facility].

## 2023-05-10 NOTE — Telephone Encounter (Signed)
 Called and spoke to pt in Spanish; [place] Scheduling was called to schedule CT Angio Chest Aorta (for 05/29/2023); pt will plan on getting BMET drawn at NP visit on 05/19/2023.    Pt asking if this test has to be done every year, advised her that we will get back in touch with her after results are posted and MD's recommendations.

## 2023-05-17 ENCOUNTER — Other Ambulatory Visit (HOSPITAL_COMMUNITY): Payer: Self-pay

## 2023-05-19 ENCOUNTER — Ambulatory Visit: Payer: Self-pay | Admitting: Nurse Practitioner

## 2023-05-19 ENCOUNTER — Encounter: Payer: Self-pay | Admitting: Nurse Practitioner

## 2023-05-19 ENCOUNTER — Ambulatory Visit: Payer: Self-pay | Attending: Nurse Practitioner | Admitting: Nurse Practitioner

## 2023-05-19 ENCOUNTER — Other Ambulatory Visit: Payer: Self-pay

## 2023-05-19 VITALS — BP 158/78 | HR 77 | Resp 19 | Ht 60.0 in | Wt 166.6 lb

## 2023-05-19 DIAGNOSIS — R309 Painful micturition, unspecified: Secondary | ICD-10-CM

## 2023-05-19 DIAGNOSIS — E1165 Type 2 diabetes mellitus with hyperglycemia: Secondary | ICD-10-CM

## 2023-05-19 DIAGNOSIS — Z7984 Long term (current) use of oral hypoglycemic drugs: Secondary | ICD-10-CM

## 2023-05-19 LAB — POCT GLYCOSYLATED HEMOGLOBIN (HGB A1C): Hemoglobin A1C: 8.3 % — AB (ref 4.0–5.6)

## 2023-05-19 MED ORDER — TRUEPLUS LANCETS 28G MISC
5 refills | Status: AC
  Filled 2023-05-19: qty 100, 25d supply, fill #0
  Filled 2023-11-13: qty 100, 25d supply, fill #1

## 2023-05-19 MED ORDER — TRUE METRIX BLOOD GLUCOSE TEST VI STRP
ORAL_STRIP | 3 refills | Status: AC
  Filled 2023-05-19: qty 100, fill #0
  Filled 2023-05-19: qty 100, 25d supply, fill #0
  Filled 2023-11-13: qty 100, 25d supply, fill #1

## 2023-05-19 MED ORDER — PHENAZOPYRIDINE HCL 100 MG PO TABS
100.0000 mg | ORAL_TABLET | Freq: Three times a day (TID) | ORAL | 0 refills | Status: DC | PRN
Start: 2023-05-19 — End: 2023-10-18
  Filled 2023-05-19: qty 10, 4d supply, fill #0

## 2023-05-19 MED ORDER — TRUE METRIX METER W/DEVICE KIT
PACK | 0 refills | Status: AC
  Filled 2023-05-19: qty 1, 30d supply, fill #0

## 2023-05-19 MED ORDER — JANUMET 50-1000 MG PO TABS
1.0000 | ORAL_TABLET | Freq: Two times a day (BID) | ORAL | 1 refills | Status: DC
Start: 2023-05-19 — End: 2023-05-19

## 2023-05-19 MED ORDER — JANUMET 50-1000 MG PO TABS
1.0000 | ORAL_TABLET | Freq: Two times a day (BID) | ORAL | 1 refills | Status: AC
  Filled 2023-05-19 – 2023-06-05 (×2): qty 180, 90d supply, fill #0
  Filled 2023-08-28: qty 180, 90d supply, fill #1

## 2023-05-19 NOTE — Progress Notes (Signed)
 Assessment & Plan:  Erin Huff was seen today for dysuria and medical management of chronic issues.  Diagnoses and all orders for this visit:  Uncontrolled type 2 diabetes mellitus with hyperglycemia, without long-term current use of insulin (HCC) -     POCT glycosylated hemoglobin (Hb A1C) -     Discontinue: sitaGLIPtin-metformin (JANUMET) 50-1000 MG tablet; Take 1 tablet by mouth 2 (two) times daily with a meal. -     CMP14+EGFR -     Blood Glucose Monitoring Suppl (TRUE METRIX METER) w/Device KIT; Use as instructed. Check blood glucose level by fingerstick twice per day.  E11.65 -     sitaGLIPtin-metformin (JANUMET) 50-1000 MG tablet; Take 1 tablet by mouth 2 (two) times daily with a meal. -     glucose blood (TRUE METRIX BLOOD GLUCOSE TEST) test strip; USE AS INSTRUCTED. -     TRUEplus Lancets 28G MISC; Use as instructed. Check blood glucose level by fingerstick twice per day. E11.65  Painful urination -     phenazopyridine (PYRIDIUM) 100 MG tablet; Take 1 tablet (100 mg total) by mouth 3 (three) times daily as needed for pain. With urination -     Urinalysis, Complete -     Urine Culture    Patient has been counseled on age-appropriate routine health concerns for screening and prevention. These are reviewed and up-to-date. Referrals have been placed accordingly. Immunizations are up-to-date or declined.    Subjective:   Chief Complaint  Patient presents with   Dysuria   Medical Management of Chronic Issues    Erin Huff 68 y.o. female presents to office today for follow-up to diabetes.  VRI was used to communicate directly with patient for the entire encounter including providing detailed patient instructions.      She has been seen in the emergency room for 2 separate occurrences of cystitis and dysuria with both urine samples being negative.  Today she reports the same symptoms of dysuria and feeling a burning sensation when she urinates.  I have  instructed her today if urine sample is negative we will need to send her to gynecology to evaluate for menopausal symptoms of vaginal atrophy.  We may also need to stop Jardiance.   She has a past medical history of Allergy, Depression, DM2, GERD, Hyperlipidemia, Hypertension, Moderate to severe aortic stenosis Followed by Cardiology  (03/2011), Thoracic aortic aneurysm (04/06/2021), and Thyroid disease.    DM 2 Diabetes is poorly controlled.  She is currently prescribed metformin, glipizide and Jardiance.  We will need to stop metformin and start Janumet today.  Unfortunately we may need to stop Jardiance in the near future as well due to frequent UTI symptoms. Lab Results  Component Value Date   HGBA1C 8.3 (A) 05/19/2023     HTN Blood pressure remains elevated today.  She states she did drink coffee this morning.  She is currently prescribed spironolactone 25 mg daily and carvedilol 6.25 mg twice daily.  Medications tried in the past that she cannot tolerate include amlodipine lisinopril and losartan BP Readings from Last 3 Encounters:  05/19/23 (!) 158/78  04/08/23 (!) 148/81  03/13/23 (!) 152/71    Review of Systems  Constitutional:  Negative for fever, malaise/fatigue and weight loss.  HENT: Negative.  Negative for nosebleeds.   Eyes: Negative.  Negative for blurred vision, double vision and photophobia.  Respiratory: Negative.  Negative for cough and shortness of breath.   Cardiovascular: Negative.  Negative for chest pain, palpitations and leg swelling.  Gastrointestinal: Negative.  Negative for heartburn, nausea and vomiting.  Genitourinary:  Positive for dysuria. Negative for flank pain, frequency, hematuria and urgency.       See HPI  Musculoskeletal: Negative.  Negative for myalgias.  Neurological: Negative.  Negative for dizziness, focal weakness, seizures and headaches.  Psychiatric/Behavioral: Negative.  Negative for suicidal ideas.     Past Medical History:   Diagnosis Date   Allergy    Depression    mild   Diabetes mellitus without complication (HCC)    GERD (gastroesophageal reflux disease)    Hyperlipidemia    Hypertension    Moderate to severe aortic stenosis 03/2011   Moderate to severe AS noted on echo with mean gradient 27 mmHg.  Also noted moderate dilation of the ascending aorta-46 mm.   Thoracic aortic aneurysm (HCC) 04/06/2021   Measuring 46 mm on Echo; CT angiogram chest-aorta 08/10/2021: 4.2 x 4.2 ascending aortic dilation.  Mildly enlarged heart.   Thyroid disease     Past Surgical History:  Procedure Laterality Date   NO PAST SURGERIES     TRANSTHORACIC ECHOCARDIOGRAM  04/05/2021   EF 60 to 65%.  GR 1 DD.  Normal RV size and mildly elevated RVSP.  Normal RAP.  Moderate to Severe Aortic Stenosis.  Mean gradient 27 mmHg.  Moderate dilation of the ascending aorta measuring 46 mm.  Recommend CTA    Family History  Problem Relation Age of Onset   Hypertension Sister    Diabetes Brother    Breast cancer Maternal Aunt    Colon cancer Neg Hx    Colon polyps Neg Hx    Esophageal cancer Neg Hx    Rectal cancer Neg Hx    Stomach cancer Neg Hx     Social History Reviewed with no changes to be made today.   Outpatient Medications Prior to Visit  Medication Sig Dispense Refill   Blood Glucose Monitoring Suppl (TRUE METRIX METER) w/Device KIT Use as directed 1 kit 0   carvedilol (COREG) 6.25 MG tablet Take 1 tablet (6.25 mg total) by mouth 2 (two) times daily with a meal. 180 tablet 3   empagliflozin (JARDIANCE) 25 MG TABS tablet Take 1 tablet (25 mg total) by mouth daily before breakfast. For diabetes 90 tablet 1   glipiZIDE (GLUCOTROL XL) 10 MG 24 hr tablet Take 2 tablets (20 mg total) by mouth daily with breakfast. 180 tablet 1   levothyroxine (SYNTHROID) 100 MCG tablet Take 1 tablet (100 mcg total) by mouth daily before breakfast. 90 tablet 2   lidocaine (LIDODERM) 5 % Place 1 patch onto the skin daily. Remove & Discard  patch within 12 hours or as directed by MD 30 patch 0   rosuvastatin (CRESTOR) 5 MG tablet Take 1 tablet (5 mg total) by mouth daily. 90 tablet 2   Safety Seal Miscellaneous MISC Melaxemic Cream with tranexamic acid 5% kojic acid USP 2% vit c USP 2.5% hyaluronic acid EXCP 0.1% use on areas twice a day 15 g 0   spironolactone (ALDACTONE) 25 MG tablet Take 1 tablet (25 mg total) by mouth daily. 90 tablet 1   glucose blood (TRUE METRIX BLOOD GLUCOSE TEST) test strip USE AS INSTRUCTED. 100 strip 3   metFORMIN (GLUCOPHAGE) 1000 MG tablet Take 1 tablet (1,000 mg total) by mouth 2 (two) times daily with a meal. 180 tablet 2   phenazopyridine (PYRIDIUM) 100 MG tablet Take 1 tablet (100 mg total) by mouth 3 (three) times daily as needed for pain.  10 tablet 0   TRUEplus Lancets 28G MISC Use as directed 100 each 5   Baclofen 5 MG TABS Take 1 tablet (5 mg total) by mouth at bedtime as needed. (Patient not taking: Reported on 05/19/2023) 10 tablet 0   clobetasol cream (TEMOVATE) 0.05 % Apply 1 Application topically 2 (two) times daily. USE FOR 2 WEEKS THEN STOP (Patient not taking: Reported on 05/19/2023) 60 g 1   No facility-administered medications prior to visit.    Allergies  Allergen Reactions   Motrin [Ibuprofen] Rash   Penicillins Rash       Objective:    BP (!) 158/78 (BP Location: Left Arm, Patient Position: Sitting, Cuff Size: Normal)   Pulse 77   Resp 19   Ht 5' (1.524 m)   Wt 166 lb 9.6 oz (75.6 kg)   SpO2 98%   BMI 32.54 kg/m  Wt Readings from Last 3 Encounters:  05/19/23 166 lb 9.6 oz (75.6 kg)  02/17/23 171 lb 3.2 oz (77.7 kg)  12/05/22 168 lb (76.2 kg)    Physical Exam Vitals and nursing note reviewed.  Constitutional:      Appearance: She is well-developed.  HENT:     Head: Normocephalic and atraumatic.  Cardiovascular:     Rate and Rhythm: Normal rate and regular rhythm.     Heart sounds: Normal heart sounds. No murmur heard.    No friction rub. No gallop.   Pulmonary:     Effort: Pulmonary effort is normal. No tachypnea or respiratory distress.     Breath sounds: Normal breath sounds. No decreased breath sounds, wheezing, rhonchi or rales.  Chest:     Chest wall: No tenderness.  Abdominal:     General: Bowel sounds are normal.     Palpations: Abdomen is soft.  Musculoskeletal:        General: Normal range of motion.     Cervical back: Normal range of motion.  Skin:    General: Skin is warm and dry.  Neurological:     Mental Status: She is alert and oriented to person, place, and time.     Coordination: Coordination normal.  Psychiatric:        Behavior: Behavior normal. Behavior is cooperative.        Thought Content: Thought content normal.        Judgment: Judgment normal.          Patient has been counseled extensively about nutrition and exercise as well as the importance of adherence with medications and regular follow-up. The patient was given clear instructions to go to ER or return to medical center if symptoms don't improve, worsen or new problems develop. The patient verbalized understanding.   Follow-up: Return in about 6 weeks (around 06/30/2023) for meter check with LUKE.   Claiborne Rigg, FNP-BC [facility] and [facility] [place], [place] [phone]   05/19/2023, 12:17 PM

## 2023-05-19 NOTE — Patient Instructions (Signed)
 Controle su nivel de azcar en la sangre por la maana antes de comer. Su nivel de azcar debe estar entre 90 y 130.  Despus de su comida ms abundante, controle su nivel de azcar en la sangre en 1 o 2 horas. Debe ser inferior a 180.  Si su nivel de azcar en la sangre es inferior a 80, llame a la consulta.

## 2023-05-20 LAB — URINALYSIS, COMPLETE
Bilirubin, UA: NEGATIVE
Ketones, UA: NEGATIVE
Nitrite, UA: NEGATIVE
Protein,UA: NEGATIVE
RBC, UA: NEGATIVE
Specific Gravity, UA: 1.018 (ref 1.005–1.030)
Urobilinogen, Ur: 0.2 mg/dL (ref 0.2–1.0)
pH, UA: 7 (ref 5.0–7.5)

## 2023-05-20 LAB — CMP14+EGFR
ALT: 16 IU/L (ref 0–32)
AST: 18 IU/L (ref 0–40)
Albumin: 4.5 g/dL (ref 3.9–4.9)
Alkaline Phosphatase: 145 IU/L — ABNORMAL HIGH (ref 44–121)
BUN/Creatinine Ratio: 19 (ref 12–28)
BUN: 15 mg/dL (ref 8–27)
Bilirubin Total: 0.5 mg/dL (ref 0.0–1.2)
CO2: 17 mmol/L — ABNORMAL LOW (ref 20–29)
Calcium: 10 mg/dL (ref 8.7–10.3)
Chloride: 100 mmol/L (ref 96–106)
Creatinine, Ser: 0.8 mg/dL (ref 0.57–1.00)
Globulin, Total: 3.3 g/dL (ref 1.5–4.5)
Glucose: 252 mg/dL — ABNORMAL HIGH (ref 70–99)
Potassium: 4.5 mmol/L (ref 3.5–5.2)
Sodium: 138 mmol/L (ref 134–144)
Total Protein: 7.8 g/dL (ref 6.0–8.5)
eGFR: 81 mL/min/{1.73_m2} (ref >59)

## 2023-05-20 LAB — MICROSCOPIC EXAMINATION
Bacteria, UA: NONE SEEN
Casts: NONE SEEN /LPF
RBC, Urine: NONE SEEN /HPF (ref 0–2)

## 2023-05-21 LAB — URINE CULTURE

## 2023-05-22 ENCOUNTER — Telehealth: Payer: Self-pay | Admitting: *Deleted

## 2023-05-22 NOTE — Telephone Encounter (Signed)
 Copied from CRM [id number]. Topic: Clinical - Lab/Test Results >> May 22, 2023  1:02 PM Gery Pray wrote: Reason for CRM: Patient would like a call regarding labs that were completed on 03/28. Patient can be contacted at [phone]. Patient needs a Research officer, trade [employer].

## 2023-05-22 NOTE — Telephone Encounter (Signed)
 Labs have not yet been resulted. Will call once they are received

## 2023-05-23 ENCOUNTER — Other Ambulatory Visit: Payer: Self-pay

## 2023-05-24 ENCOUNTER — Other Ambulatory Visit: Payer: Self-pay | Admitting: Nurse Practitioner

## 2023-05-24 ENCOUNTER — Other Ambulatory Visit: Payer: Self-pay

## 2023-05-24 DIAGNOSIS — R399 Unspecified symptoms and signs involving the genitourinary system: Secondary | ICD-10-CM

## 2023-05-24 MED ORDER — SULFAMETHOXAZOLE-TRIMETHOPRIM 800-160 MG PO TABS
1.0000 | ORAL_TABLET | Freq: Two times a day (BID) | ORAL | 0 refills | Status: AC
  Filled 2023-05-24: qty 6, 3d supply, fill #0

## 2023-05-26 ENCOUNTER — Other Ambulatory Visit: Payer: Self-pay

## 2023-05-29 ENCOUNTER — Other Ambulatory Visit: Payer: Self-pay

## 2023-05-29 ENCOUNTER — Ambulatory Visit (HOSPITAL_COMMUNITY)
Admission: RE | Admit: 2023-05-29 | Discharge: 2023-05-29 | Disposition: A | Discharge status: Home / Self Care | Payer: Self-pay | Source: Ambulatory Visit | Attending: Cardiology | Admitting: Cardiology

## 2023-05-29 DIAGNOSIS — I77810 Thoracic aortic ectasia: Secondary | ICD-10-CM | POA: Insufficient documentation

## 2023-05-29 DIAGNOSIS — I350 Nonrheumatic aortic (valve) stenosis: Secondary | ICD-10-CM | POA: Insufficient documentation

## 2023-05-29 MED ORDER — IOHEXOL 350 MG/ML SOLN
75.0000 mL | Freq: Once | INTRAVENOUS | Status: AC | PRN
  Administered 2023-05-29: 75 mL via INTRAVENOUS

## 2023-05-31 ENCOUNTER — Other Ambulatory Visit: Payer: Self-pay

## 2023-06-02 ENCOUNTER — Other Ambulatory Visit: Payer: Self-pay

## 2023-06-05 ENCOUNTER — Other Ambulatory Visit: Payer: Self-pay

## 2023-06-26 ENCOUNTER — Other Ambulatory Visit: Payer: Self-pay

## 2023-06-27 ENCOUNTER — Other Ambulatory Visit: Payer: Self-pay

## 2023-06-30 ENCOUNTER — Ambulatory Visit: Payer: Self-pay | Attending: Family Medicine | Admitting: Pharmacist

## 2023-06-30 ENCOUNTER — Other Ambulatory Visit: Payer: Self-pay

## 2023-06-30 ENCOUNTER — Encounter: Payer: Self-pay | Admitting: Pharmacist

## 2023-06-30 DIAGNOSIS — R21 Rash and other nonspecific skin eruption: Secondary | ICD-10-CM

## 2023-06-30 DIAGNOSIS — Z7984 Long term (current) use of oral hypoglycemic drugs: Secondary | ICD-10-CM

## 2023-06-30 DIAGNOSIS — E1165 Type 2 diabetes mellitus with hyperglycemia: Secondary | ICD-10-CM

## 2023-06-30 MED ORDER — TRIAMCINOLONE ACETONIDE 0.5 % EX OINT
1.0000 | TOPICAL_OINTMENT | Freq: Two times a day (BID) | CUTANEOUS | 1 refills | Status: AC
  Filled 2023-06-30: qty 30, 15d supply, fill #0
  Filled 2023-10-18: qty 60, 30d supply, fill #1
  Filled 2023-10-31: qty 30, 7d supply, fill #1
  Filled 2023-11-13: qty 30, 15d supply, fill #1

## 2023-06-30 NOTE — Progress Notes (Signed)
    S:    PCP: [name]  Patient presents for diabetes evaluation, education, and management. Patient was referred and last seen by Primary Care Provider on 05/19/2023. Seen by CPP in 2023.   At her visit with [name] in March, A1c was 8.3% (up from 8.1 prior). Her metformin  was changed to Janumet .   Today, patient arrives in good spirits and visit was completed with use of a Spanish video interpreter. She reports she has been tolerating Janumet  well.  She endorses adherence with this, Jardiance , and glipizide  today. Her Janumet  and Jardiance  are provided via [facility]. The Jardiance  ships to her home but she picks up Janumet  and glipizide  from our pharmacy. Her DM is longstanding. She does not have any recent admissions for DKA. No hx of pancreatitis or thyroid  cancer. No known clinical ASCVD, CHF, or CKD.   Family/Social History:  -Denies alcohol use  -Never smoker -HTN in sister, DM in brother  Insurance coverage/medication affordability: self pay  Medication adherence reported.   Current diabetes medications include: Janumet  50-1000 mg BID, glipizide  XL 20 mg daily, Jardiance  25 mg daily  Patient denies hypoglycemic events.  Patient reported dietary habits: Eats 3 meals/day - morning, noon, and afternoon. Loves eating tortillas - "cannot resist eating them." Reports eating more vegetables and whole grains. Tries to avoid meat.  Patient-reported exercise habits: started back at [employer]  Patient denies nocturia (nighttime urination). Patient denies neuropathy (nerve pain).  Patient denies visual changes.  Patient reports self foot exams.   Checks BG once daily in the morning before breakfast Reported home fasting blood sugars: gives range in the 110s - 130s. Denies any readings > 200 since starting Janumet .   O:   Lab Results  Component Value Date   HGBA1C 8.3 (A) 05/19/2023   Lipid Panel     Component Value Date/Time   CHOL 136 11/15/2022 1443   TRIG 143 11/15/2022 1443   HDL 49  11/15/2022 1443   CHOLHDL 2.8 11/15/2022 1443   CHOLHDL 3.7 03/01/2016 0956   VLDL 47 (H) 03/01/2016 0956   LDLCALC 62 11/15/2022 1443   Clinical Atherosclerotic Cardiovascular Disease (ASCVD): No  The 10-year ASCVD risk score (Arnett DK, et al., 2019) is: 19.9%   Values used to calculate the score:     Age: 6 years     Sex: Female     Is Non-Hispanic African American: No     Diabetic: Yes     Tobacco smoker: No     Systolic Blood Pressure: 158 mmHg     Is BP treated: Yes     HDL Cholesterol: 49 mg/dL     Total Cholesterol: 136 mg/dL   A/P: Diabetes longstanding currently uncontrolled with most recent A1c 8.3 in March. She is not currently symptomatic from a hyper- or hypoglycemia standpoint. Patient is able to verbalize appropriate hypoglycemia management plan. Medication adherence appears appropriate. Advised her to continue current medication regimen and return next month for an A1c. -Continue Janumet , glipizide , and Jardiance  at current doses.  -Extensively discussed pathophysiology of diabetes, recommended lifestyle interventions, dietary effects on blood sugar control -Counseled on s/sx of and management of hypoglycemia -Next A1C anticipated 07/2023.   Total time in face to face counseling 30 minutes.  Follow up with me in June.   Marene Shape, PharmD, Becky Bowels, CPP Clinical Pharmacist [facility] [phone]

## 2023-07-18 ENCOUNTER — Other Ambulatory Visit (HOSPITAL_COMMUNITY): Payer: Self-pay

## 2023-07-18 ENCOUNTER — Telehealth: Payer: Self-pay | Admitting: Nurse Practitioner

## 2023-07-18 ENCOUNTER — Other Ambulatory Visit: Payer: Self-pay

## 2023-07-18 DIAGNOSIS — E1165 Type 2 diabetes mellitus with hyperglycemia: Secondary | ICD-10-CM

## 2023-07-18 NOTE — Telephone Encounter (Unsigned)
 Copied from CRM [id number]. Topic: Clinical - Medication Refill >> Jul 18, 2023  1:08 PM Erin Huff wrote: Medication: empagliflozin  (JARDIANCE ) 25 MG TABS tablet  Has the patient contacted their pharmacy? No (Agent: If no, request that the patient contact the pharmacy for the refill. If patient does not wish to contact the pharmacy document the reason why and proceed with request.) (Agent: If yes, when and what did the pharmacy advise?)  This is the patient's preferred pharmacy:  [facility] [address], [address] [place] [address] Phone: [phone] Fax: [phone]  Is this the correct pharmacy for this prescription? Yes If no, delete pharmacy and type the correct one.   Has the prescription been filled recently? No  Is the patient out of the medication? No but takes a while to be sent to her  Has the patient been seen for an appointment in the last year OR does the patient have an upcoming appointment? Yes  Can we respond through MyChart? No  Agent: Please be advised that Rx refills may take up to 3 business days. We ask that you follow-up with your pharmacy.

## 2023-07-20 MED ORDER — EMPAGLIFLOZIN 25 MG PO TABS
25.0000 mg | ORAL_TABLET | Freq: Every day | ORAL | 1 refills | Status: DC
Start: 2023-07-20 — End: 2023-12-21
  Filled 2023-07-20 – 2023-08-07 (×2): qty 90, 90d supply, fill #0

## 2023-07-20 NOTE — Telephone Encounter (Signed)
 Requested Prescriptions  Pending Prescriptions Disp Refills   empagliflozin  (JARDIANCE ) 25 MG TABS tablet 90 tablet 1    Sig: Take 1 tablet (25 mg total) by mouth daily before breakfast. For diabetes     Endocrinology:  Diabetes - SGLT2 Inhibitors Failed - 07/20/2023  5:30 PM      Failed - HBA1C is between 0 and 7.9 and within 180 days    Hemoglobin A1C  Date Value Ref Range Status  05/19/2023 8.3 (A) 4.0 - 5.6 % Final   HbA1c, POC (controlled diabetic range)  Date Value Ref Range Status  09/10/2021 7.3 (A) 0.0 - 7.0 % Corrected   Hgb A1c MFr Bld  Date Value Ref Range Status  12/16/2021 7.2 (H) 4.8 - 5.6 % Final    Comment:             Prediabetes: 5.7 - 6.4          Diabetes: >6.4          Glycemic control for adults with diabetes: <7.0          Passed - Cr in normal range and within 360 days    Creat  Date Value Ref Range Status  03/01/2016 0.81 0.50 - 1.05 mg/dL Final    Comment:      For patients > or = 66 years of age: The upper reference limit for Creatinine is approximately 13% higher for people identified as African-American.      Creatinine, Ser  Date Value Ref Range Status  05/19/2023 0.80 0.57 - 1.00 mg/dL Final   Creatinine, Urine  Date Value Ref Range Status  03/01/2016 251 20 - 320 mg/dL Final         Passed - eGFR in normal range and within 360 days    GFR calc Af Amer  Date Value Ref Range Status  01/21/2020 109 >59 mL/min/1.73 Final    Comment:    **In accordance with recommendations from the NKF-ASN Task force,**   [facility] is in the process of updating its eGFR calculation to the   2021 CKD-EPI creatinine equation that estimates kidney function   without a race variable.    GFR, Estimated  Date Value Ref Range Status  10/05/2022 >60 >60 mL/min Final    Comment:    (NOTE) Calculated using the CKD-EPI Creatinine Equation (2021)    eGFR  Date Value Ref Range Status  05/19/2023 81 >59 mL/min/1.73 Final         Passed - Valid  encounter within last 6 months    Recent Outpatient Visits           2 weeks ago Uncontrolled type 2 diabetes mellitus with hyperglycemia, without long-term current use of insulin  (HCC)   [facility] A Dept Of [place]. [facility] Freada Jacobs, [place] L, RPH-CPP   2 months ago Uncontrolled type 2 diabetes mellitus with hyperglycemia, without long-term current use of insulin  [facility])   [facility] - A Dept Of [place]. [facility] [place], [place] W, NP   5 months ago Uncontrolled type 2 diabetes mellitus with hyperglycemia, without long-term current use of insulin  [facility])   [facility] - A Dept Of [place]. [facility] [place], [place] W, NP   8 months ago Uncontrolled type 2 diabetes mellitus with hyperglycemia, without long-term current use of insulin  [facility])   [facility] - A Dept Of [place]. [facility] Collins Dean, NP   11 months ago Essential hypertension    [facility] - A Dept Of . [facility] Joaquin Mul[place], MD       Future Appointments             In 1 month Addie Holstein Vashti Gentles, MD [facility] A Dept of [facility] [place]. [facility] [employer], [employer]

## 2023-07-21 ENCOUNTER — Other Ambulatory Visit: Payer: Self-pay

## 2023-07-27 ENCOUNTER — Other Ambulatory Visit: Payer: Self-pay

## 2023-08-03 ENCOUNTER — Ambulatory Visit: Payer: Self-pay | Admitting: Pharmacist

## 2023-08-07 ENCOUNTER — Other Ambulatory Visit: Payer: Self-pay

## 2023-08-08 ENCOUNTER — Other Ambulatory Visit: Payer: Self-pay

## 2023-08-10 ENCOUNTER — Other Ambulatory Visit: Payer: Self-pay | Admitting: Obstetrics and Gynecology

## 2023-08-10 ENCOUNTER — Ambulatory Visit: Payer: Self-pay | Admitting: Dermatology

## 2023-08-10 DIAGNOSIS — Z1231 Encounter for screening mammogram for malignant neoplasm of breast: Secondary | ICD-10-CM

## 2023-08-11 ENCOUNTER — Other Ambulatory Visit: Payer: Self-pay

## 2023-08-14 ENCOUNTER — Other Ambulatory Visit: Payer: Self-pay

## 2023-08-14 ENCOUNTER — Other Ambulatory Visit: Payer: Self-pay | Admitting: Nurse Practitioner

## 2023-08-14 ENCOUNTER — Telehealth: Payer: Self-pay | Admitting: Pharmacist

## 2023-08-14 DIAGNOSIS — I10 Essential (primary) hypertension: Secondary | ICD-10-CM

## 2023-08-14 MED ORDER — SPIRONOLACTONE 25 MG PO TABS
25.0000 mg | ORAL_TABLET | Freq: Every day | ORAL | 1 refills | Status: DC
  Filled 2023-08-14: qty 90, 90d supply, fill #0
  Filled 2023-10-31: qty 90, 90d supply, fill #1

## 2023-08-14 NOTE — Telephone Encounter (Signed)
 Can we schedule this patient with either me or PCP in 1 month?  She is due for an A1c.

## 2023-08-15 NOTE — Telephone Encounter (Signed)
 E2C2 contacted the clinic with a Spanish interpreter for the patient. The appointment has been scheduled for 09/14/2023 at 2:30 pm.

## 2023-08-18 ENCOUNTER — Other Ambulatory Visit: Payer: Self-pay

## 2023-08-29 ENCOUNTER — Other Ambulatory Visit: Payer: Self-pay

## 2023-08-31 ENCOUNTER — Other Ambulatory Visit: Payer: Self-pay

## 2023-09-05 ENCOUNTER — Encounter: Payer: Self-pay | Admitting: Cardiology

## 2023-09-05 ENCOUNTER — Ambulatory Visit: Payer: Self-pay | Attending: Cardiology | Admitting: Cardiology

## 2023-09-05 VITALS — BP 136/74 | HR 75 | Ht 60.0 in | Wt 170.0 lb

## 2023-09-05 DIAGNOSIS — I77810 Thoracic aortic ectasia: Secondary | ICD-10-CM

## 2023-09-05 DIAGNOSIS — Z6834 Body mass index (BMI) 34.0-34.9, adult: Secondary | ICD-10-CM

## 2023-09-05 DIAGNOSIS — I350 Nonrheumatic aortic (valve) stenosis: Secondary | ICD-10-CM

## 2023-09-05 DIAGNOSIS — E785 Hyperlipidemia, unspecified: Secondary | ICD-10-CM

## 2023-09-05 DIAGNOSIS — E6609 Other obesity due to excess calories: Secondary | ICD-10-CM

## 2023-09-05 DIAGNOSIS — E1169 Type 2 diabetes mellitus with other specified complication: Secondary | ICD-10-CM

## 2023-09-05 DIAGNOSIS — I10 Essential (primary) hypertension: Secondary | ICD-10-CM

## 2023-09-05 DIAGNOSIS — E66811 Obesity, class 1: Secondary | ICD-10-CM

## 2023-09-05 NOTE — Progress Notes (Signed)
 Cardiology Office Note:  .   Date:  09/09/2023  ID:  Erin Huff, DOB 1955-05-24, MRN [id number] PCP: Theotis Haze ORN, NP  [facility] Providers Cardiologist:  Alm Clay, MD     Chief Complaint  Patient presents with   Follow-up    Delayed annual follow-up   Cardiac Valve Problem    Aortic stenosis-had echo earlier this year.    Patient Profile: .     Erin Huff is a mildly obese, very pleasant 68 y.o. Spanish only speaking female with a PMH notable for Moderate Aortic Stenosis and associated Ascending Aortic Dilation, along with DM-2, HTN and HLD who presents here for delayed annual follow-up at the request of Theotis Haze ORN, NP.  Initially referred with diagnosis of severe AS and aortic dilation that only boor out to be moderate for both.  We have been following her for several years. Is usually companied by family member but also has required a [place] interpreter for history.     Erin Huff was last seen on 06/06/2022: She was doing well from a cardiac standpoint just dealing with a cold.  Otherwise no real cardiac symptoms. => Plan was annual follow-up after rechecking echocardiogram and a CT angiogram of the chest.  Subjective  Discussed the use of AI scribe software for clinical note transcription with the patient, who gave verbal consent to proceed.  History of Present Illness  Erin Huff is a 68 year old female with aortic valve stenosis who presents for follow-up evaluation of her condition after having repeat echocardiogram and CTA-aorta.  Echo showed stable moderate aortic stenosis and CT angiogram suggested if anything lower diameters of the ascending aorta than previously noted.  No recent hospital stays but has been dealing with sinusitis, cough and congestion symptoms-has a appointment with PCP on 24 July..  She has had a couple urgent care visits for cystitis and dysuria earlier on in the  year.  She has done well from a cardiac standpoint: No shortness of breath, chest tightness, or pressure during activities like walking or gardening. She walks regularly, though not daily, and does not experience significant fatigue that limits her activities. No waking up at night due to breathing difficulties and no swelling in her legs. She occasionally feels dizzy when walking fast but does not feel like she will pass out. No palpitations or heart racing.  She is currently taking Jardiance , Janumet , and glipizide  for diabetes, with her last A1c being 8.3. For cholesterol, she takes rosuvastatin  5 mg, and for blood pressure, she is on carvedilol  6.25 mg twice a day and spironolactone  25 mg once a day. Her blood pressure at home usually runs between 120-130 mmHg.     Objective   Medications - Jardiance  25 mg daily; - Janumet  50-100 mg daily; - Glipizide  10 mg daily - Rosuvastatin  5 mg daily - Carvedilol  6.25 mg twice daily;- Spironolactone  25 mg daily - Synthroid  100 mcg daily  Studies Reviewed: SABRA   EKG Interpretation Date/Time:  Tuesday September 05 2023 16:36:54 EDT Ventricular Rate:  74 PR Interval:  188 QRS Duration:  86 QT Interval:  388 QTC Calculation: 430 R Axis:   88  Text Interpretation: Normal sinus rhythm Normal ECG When compared with ECG of 26-Sep-2016 16:05, No significant change was found Confirmed by Clay Alm ([id number]) on 09/05/2023 5:41:41 PM    Results LABS 05/19/2023: A1c: 8.3;Cr 0.8, K+ 4.5;. 11/15/2022: TC 136, TG 143, HDL 49, LDL 62.   RADIOLOGY CT Angiogram of Chest-Aorta:  Aorta diameter 3.8-3.9 cm proximally and 3.9 x 3.5 distally.  Subtle calcification of the annulus of the aortic valve.  No significant coronary artery calcification.  Stable compared to prior examination.  Aneurysmal dilation of ascending aorta.  Stable lungs with subtle groundglass appearance in both lungs potentially correlating to interstitial lung disease.  No emphysema.   (06/09/2023)  DIAGNOSTIC Echocardiogram: LVEF 60-65%, no RWMA.  Mild LVH.  Normal diastolic parameters.  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  Mild ascending aortic dilation-44 mm.  Normal RAP.  Normal RV size and function.  (03/23/2023)   Lab Results  Component Value Date   CHOL 136 11/15/2022   HDL 49 11/15/2022   LDLCALC 62 11/15/2022   TRIG 143 11/15/2022   CHOLHDL 2.8 11/15/2022     Risk Assessment/Calculations:           Physical Exam:   VS:  BP 136/74   Pulse 75   Ht 5' (1.524 m)   Wt 170 lb (77.1 kg)   SpO2 97%   BMI 33.20 kg/m   ; initial BP was 150/75 Wt Readings from Last 3 Encounters:  09/05/23 170 lb (77.1 kg)  05/19/23 166 lb 9.6 oz (75.6 kg)  02/17/23 171 lb 3.2 oz (77.7 kg)    GEN: Well nourished, well groomed in no acute distress; healthy-appearing. NECK: No JVD; No carotid bruits; normal carotid upstroke. CARDIAC: RRR, Normal S1 & S2; harsh C-D 2-3/6 SEM at RUSB-neck.  Otherwise no, rubs, gallops RESPIRATORY:  Clear to auscultation without rales, wheezing or rhonchi ; nonlabored, good air movement. ABDOMEN: Soft, non-tender, non-distended EXTREMITIES:  No edema; No deformity      ASSESSMENT AND PLAN: .    Problem List Items Addressed This Visit       Cardiology Problems   Essential hypertension (Chronic)   Slightly elevated blood pressure today with first check, likely due to cough.  Home readings 120-130 mmHg. Important to maintain to prevent aortic dilation and reduce cardiac workload. - Continue carvedilol  6.25 mg twice daily, and spironolactone  25 mg daily. - Monitor blood pressure at home. - If systolic BP >150 mmHg, recheck in one hour, take extra carvedilol  if still elevated. - If pressures remain elevated on follow-up checks, would likely add ARB      Relevant Orders   EKG 12-Lead (Completed)   Hyperlipidemia associated with type 2 diabetes mellitus  (HCC) (Chronic)   Most recent lipids had an LDL of 62 and total cholesterol 136 with well-controlled triglycerides on a very low-dose of rosuvastatin  5 mg daily. Unfortunately her A1c remains elevated at 8.3 on a combination of Jardiance , Janumet  and glipizide . -Continue low-dose rosuvastatin  5 mg daily -Defer management of diabetes to PCP but I suspect that she may need more aggressive management to ensure improvement in A1c below 5.6.      Moderate to severe aortic stenosis - Primary (Chronic)   Moderate aortic valve stenosis with mean gradient 29 mmHg, stable.  Functionally bicuspid-like valve due to wear and tear with R and L cusp raphae.  Asymptomatic. - Order echocardiogram early 2026 to monitor stenosis. - Advise monitoring for symptoms: increased shortness of breath, chest pain, syncope.      Relevant Orders   ECHOCARDIOGRAM COMPLETE   Thoracic aortic ectasia (HCC) (Chronic)   Stable finding of dilated thoracic aorta if not improved compared to initial evaluation. At this point I think we can follow-up every  couple years.  Will reassess echocardiogram next year but check both echo and CTA chest in 2027.      Relevant Orders   ECHOCARDIOGRAM COMPLETE     Other   OBESITY (Chronic)   Inherently she would be a good candidate for GLP-1 agonist if this can be financially feasible with medication coverage. Stressed importance of increasing exercise to stay healthy and work on dietary modification..               Follow-Up: Return in about 8 months (around 05/05/2024) for Routine follow up with me, [employer].  Total time spent: 34 min spent with patient + 15 min spent charting = 48 min I spent 48 minutes in the care of Lilana Huff today including reviewing outside labs from PCP via [facility]. (1 minute), reviewing studies (Coronary CTA reviewed and compared to prior Coronary CTA, echo report and films but images also reviewed and compared to  previous echo-7 minutes), face to face time discussing treatment options (34), 7 minutes dictating, and documenting in the encounter.      Signed, Alm MICAEL Clay, MD, MS Alm Clay, M.D., M.S. Interventional Chartered certified accountant  Pager # [phone]

## 2023-09-05 NOTE — Patient Instructions (Addendum)
 Medication Instructions:  No changes   *If you need a refill on your cardiac medications before your next appointment, please call your pharmacy*   Lab Work: Not needed    Testing/Procedures: Schedule in [place]- FEB Your physician has requested that you have an echocardiogram. Echocardiography is a painless test that uses sound waves to create images of your heart. It provides your doctor with information about the size and shape of your heart and how well your heart's chambers and valves are working. This procedure takes approximately one hour. There are no restrictions for this procedure. Please do NOT wear cologne, perfume, aftershave, or lotions (deodorant is allowed). Please arrive 15 minutes prior to your appointment time.  Please note: We ask at that you not bring children with you during ultrasound (echo/ vascular) testing. Due to room size and safety concerns, children are not allowed in the ultrasound rooms during exams. Our front office staff cannot provide observation of children in our lobby area while testing is being conducted. An adult accompanying a patient to their appointment will only be allowed in the ultrasound room at the discretion of the ultrasound technician under special circumstances. We apologize for any inconvenience.   Follow-Up: At [facility], you and your health needs are our priority.  As part of our continuing mission to provide you with exceptional heart care, we have created designated Provider Care Teams.  These Care Teams include your primary Cardiologist (physician) and Advanced Practice Providers (APPs -  Physician Assistants and Nurse Practitioners) who all work together to provide you with the care you need, when you need it.     Your next appointment:   8- 9 month(s)  The format for your next appointment:   In Person  Provider:   Alm Clay, MD

## 2023-09-06 ENCOUNTER — Other Ambulatory Visit (HOSPITAL_COMMUNITY): Payer: Self-pay

## 2023-09-06 ENCOUNTER — Ambulatory Visit (HOSPITAL_COMMUNITY)
Admission: EM | Admit: 2023-09-06 | Discharge: 2023-09-06 | Disposition: A | Discharge status: Home / Self Care | Payer: Self-pay | Attending: Physician Assistant | Admitting: Physician Assistant

## 2023-09-06 ENCOUNTER — Encounter (HOSPITAL_COMMUNITY): Payer: Self-pay

## 2023-09-06 DIAGNOSIS — J0140 Acute pansinusitis, unspecified: Secondary | ICD-10-CM

## 2023-09-06 MED ORDER — DOXYCYCLINE HYCLATE 100 MG PO CAPS
100.0000 mg | ORAL_CAPSULE | Freq: Two times a day (BID) | ORAL | 0 refills | Status: DC
  Filled 2023-09-06 – 2023-09-07 (×3): qty 20, 10d supply, fill #0

## 2023-09-06 NOTE — ED Provider Notes (Signed)
 [facility]    CSN: [id number] Arrival date & time: 09/06/23  1548      History   Chief Complaint Chief Complaint  Patient presents with   Cough    HPI Erin Huff is a 68 y.o. female.   Patient presents today with a several week history of URI symptoms including congestion, sore throat, postnasal drainage, cough.  She is Spanish-speaking and video interpreter was utilized for visit.  She denies any chest pain, shortness of breath, fever, nausea, vomiting.  Denies any known sick contacts.  She has tried Tylenol  with minimal improvement of symptoms.  Denies any known sick contacts.  She has had COVID several years ago but not more recently.  Denies any history of allergies, asthma, COPD.  She denies any recent antibiotics or steroids.  She is eating and drinking normally despite symptoms.  She reports that sore throat pain is rated 6 on a 0-10 pain scale, described as aching, worse with swallowing, no alleviating factors identified.  She denies any swelling of her throat, shortness of breath, muffled voice.    Past Medical History:  Diagnosis Date   Allergy    Depression    mild   Diabetes mellitus without complication (HCC)    GERD (gastroesophageal reflux disease)    Hyperlipidemia    Hypertension    Moderate to severe aortic stenosis 03/2011   Moderate to severe AS noted on echo with mean gradient 27 mmHg.  Also noted moderate dilation of the ascending aorta-46 mm.   Thoracic aortic aneurysm (HCC) 04/06/2021   Measuring 46 mm on Echo; CT angiogram chest-aorta 08/10/2021: 4.2 x 4.2 ascending aortic dilation.  Mildly enlarged heart.   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  GR 1 DD.  Normal RV size and mildly elevated RVSP.  Normal RAP.  Moderate to Severe Aortic Stenosis.  Mean gradient 27 mmHg.  Moderate dilation of the ascending aorta measuring 46 mm.  Recommend CTA    OB History     Gravida  5   Para      Term      Preterm      AB      Living  5      SAB      IAB      Ectopic      Multiple      Live Births  5            Home Medications    Prior to Admission medications   Medication Sig Start Date End Date Taking? Authorizing Provider  doxycycline  (VIBRAMYCIN ) 100 MG capsule Take 1 capsule (100 mg total) by mouth 2 (two) times daily. 09/06/23  Yes Jamera Vanloan K, PA-C  Blood Glucose Monitoring Suppl (TRUE METRIX  METER) w/Device KIT Use as directed 06/14/16   Jegede, Olugbemiga E, MD  Blood Glucose Monitoring Suppl (TRUE METRIX METER) w/Device KIT Use as instructed. Check blood glucose level by fingerstick twice per day.  E11.65 05/19/23   Fleming, [name] W, NP  carvedilol  (COREG ) 6.25 MG tablet Take 1 tablet (6.25 mg total) by mouth 2 (two) times daily with a meal. 02/18/23   Theotis Haze ORN, NP  empagliflozin  (JARDIANCE ) 25 MG TABS tablet Take 1 tablet (25 mg total) by mouth daily before breakfast. For diabetes 07/20/23   Fleming, [name] W, NP  glipiZIDE  (GLUCOTROL  XL) 10 MG 24 hr tablet Take 2 tablets (20 mg total) by mouth daily with breakfast. 02/17/23   Fleming, [name] W, NP  glucose blood (TRUE METRIX BLOOD GLUCOSE TEST) test strip USE AS INSTRUCTED. 05/19/23 05/18/24   Fleming, [name] W, NP  levothyroxine  (SYNTHROID ) 100 MCG tablet Take 1 tablet (100 mcg total) by mouth daily before breakfast. 11/15/22   Fleming, [name] W, NP  lidocaine  (LIDODERM ) 5 % Place 1 patch onto the skin daily. Remove & Discard patch within 12 hours or as directed by MD 04/08/23   Johnie Rumaldo LABOR, NP  phenazopyridine  (PYRIDIUM ) 100 MG tablet Take 1 tablet (100 mg total) by mouth 3 (three) times daily as needed for pain. With urination 05/19/23   Theotis Haze ORN, NP  rosuvastatin  (CRESTOR ) 5 MG tablet Take 1 tablet (5 mg total) by mouth daily. 02/17/23   Fleming, [name] W, NP  Safety Seal Miscellaneous MISC Melaxemic Cream with tranexamic acid 5% kojic acid USP 2% vit c USP 2.5% hyaluronic acid EXCP 0.1% use on areas twice a day 02/08/23   Alm Delon SAILOR, DO  sitaGLIPtin -metformin  (JANUMET ) 50-1000 MG tablet Take 1 tablet by mouth 2 (two) times daily with a meal. 05/19/23   Theotis Haze ORN, NP  spironolactone  (ALDACTONE ) 25 MG tablet Take 1 tablet (25 mg total) by mouth daily. 08/14/23   Newlin, Enobong, MD  triamcinolone  ointment (KENALOG ) 0.5 % Apply 1 Application topically 2 (two) times daily. 06/30/23   Newlin, Enobong, MD  TRUEplus Lancets 28G MISC Use as instructed. Check blood glucose level by fingerstick twice per day. E11.65 05/19/23   Theotis Haze ORN, NP    Family History Family History  Problem Relation Age of Onset   Hypertension Sister    Diabetes Brother    Breast cancer Maternal Aunt    Colon cancer Neg Hx    Colon polyps Neg Hx    Esophageal cancer Neg Hx    Rectal cancer Neg Hx    Stomach cancer Neg Hx     Social History Social History   Tobacco Use   Smoking status: Never   Smokeless tobacco: Never  Vaping Use   Vaping status: Never Used  Substance Use Topics   Alcohol use: No    Comment: rare    Drug use: No     Allergies   Motrin  Cornelia.Cooper ] and Penicillins   Review of Systems Review of Systems  Constitutional:  Positive for activity change.  Negative for appetite change, fatigue and fever.  HENT:  Positive for congestion, postnasal drip, sinus pressure and sore throat. Negative for sneezing.   Respiratory:  Positive for cough. Negative for shortness of breath.   Cardiovascular:  Negative for chest pain.  Gastrointestinal:  Negative for abdominal pain, diarrhea, nausea and vomiting.  Neurological:  Positive for headaches. Negative for dizziness and light-headedness.     Physical Exam Triage Vital Signs ED Triage  Vitals  Encounter Vitals Group     BP 09/06/23 1625 123/73     Girls Systolic BP Percentile --      Girls Diastolic BP Percentile --      Boys Systolic BP Percentile --      Boys Diastolic BP Percentile --      Pulse Rate 09/06/23 1625 64     Resp 09/06/23 1625 16     Temp 09/06/23 1625 99.1 F (37.3 C)     Temp Source 09/06/23 1625 Oral     SpO2 09/06/23 1625 99 %     Weight --      Height --      Head Circumference --      Peak Flow --      Pain Score 09/06/23 1620 6     Pain Loc --      Pain Education --      Exclude from Growth Chart --    No data found.  Updated Vital Signs BP 123/73 (BP Location: Right Arm)   Pulse 64   Temp 99.1 F (37.3 C) (Oral)   Resp 16   SpO2 99%   Visual Acuity Right Eye Distance:   Left Eye Distance:   Bilateral Distance:    Right Eye Near:   Left Eye Near:    Bilateral Near:     Physical Exam Vitals reviewed.  Constitutional:      General: She is awake. She is not in acute distress.    Appearance: Normal appearance. She is well-developed. She is not ill-appearing.     Comments: Very pleasant female presented age no acute distress sitting comfortably in exam room  HENT:     Head: Normocephalic and atraumatic.     Right Ear: Tympanic membrane, ear canal and external ear normal. Tympanic membrane is not erythematous or bulging.     Left Ear: Tympanic membrane, ear canal and external ear normal. Tympanic membrane is not erythematous or bulging.     Nose:      Right Sinus: Maxillary sinus tenderness and frontal sinus tenderness present.     Left Sinus: Maxillary sinus tenderness and frontal sinus tenderness present.     Mouth/Throat:     Pharynx: Uvula midline. Postnasal drip present. No oropharyngeal exudate or posterior oropharyngeal erythema.  Cardiovascular:     Rate and Rhythm: Normal rate and regular rhythm.     Heart sounds: S1 normal and S2 normal. Murmur heard.  Pulmonary:     Effort: Pulmonary effort is normal.     Breath sounds: Normal breath sounds. No wheezing, rhonchi or rales.     Comments: Clear to auscultation bilaterally Psychiatric:        Behavior: Behavior is cooperative.      UC Treatments / Results  Labs (all labs ordered are listed, but only abnormal results are displayed) Labs Reviewed - No data to display  EKG   Radiology No results found.  Procedures Procedures (including critical care time)  Medications Ordered in UC Medications - No data to display  Initial Impression / Assessment and Plan / UC Course  I have reviewed the triage vital signs and the nursing notes.  Pertinent labs & imaging results that were available during my care of the patient were reviewed by me and considered in my medical decision making (see chart for details).     Patient is well-appearing, afebrile, nontoxic, nontachycardic.  No indication for viral testing as patient has been symptomatic for several weeks and  this would not change management.  Chest x-ray was deferred as patient had no adventitious lung sounds on exam and oxygen saturation was 99%.  Given her prolonged and worsening symptoms concern for secondary bacterial infection.  She was started on doxycycline  100 mg twice daily for 10 days given history of penicillin allergy.  We discussed that she should avoid prolonged sun exposure while on this medication due to associated photosensitivity.  She can use over-the-counter medication including Mucinex , Flonase , Tylenol ,  nasal saline/sinus rinses, gargling with warm salt water for additional symptom relief.  If she is not feeling better within a week she is return for reevaluation.  If she has any worsening symptoms she needs to be seen immediately.  Strict return precautions given.    Final Clinical Impressions(s) / UC Diagnoses   Final diagnoses:  Acute non-recurrent pansinusitis     Discharge Instructions      We are treating you for sinus infection.  Start doxycycline  100 mg twice daily for 10 days.  Stay out of the sun while on this medication.  Use over-the-counter medication including nasal saline/sinus rinses, gargling with warm salt water, Flonase , Mucinex  to help manage the symptoms.  If you are not feeling better in a week please return for reevaluation.  If anything worsens and you have high fever, worsening cough, shortness of breath, nausea/vomiting interfering with oral intake, chest pain you need to be seen immediately.  Le estamos tratando por una infeccin sinusal. Comience con doxiciclina 100 mg dos veces al da durante [address]. Evite la exposicin al sol mientras est tomando este medicamento. Use medicamentos de venta libre, como enjuagues nasales con solucin salina/sinusales, grgaras con agua tibia con sal, Flonase  y Mucinex , para ayudar a controlar los sntomas. Si no se siente mejor en una semana, regrese para una reevaluacin. Si algo empeora y presenta fiebre alta, tos que [place], dificultad para respirar, nuseas/vmitos que interfieren con la ingesta oral, o dolor en el pecho, debe ser examinado de inmediato.     ED Prescriptions     Medication Sig Dispense Auth. Provider   doxycycline  (VIBRAMYCIN ) 100 MG capsule Take 1 capsule (100 mg total) by mouth 2 (two) times daily. 20 capsule Kipp Shank K, PA-C      PDMP not reviewed this encounter.   Sherrell Rocky POUR, PA-C 09/06/23 1806

## 2023-09-06 NOTE — ED Triage Notes (Signed)
 Patient here today with c/o cough, ST, headache, and nasal drainage X 2 weeks. Patient has been taking Tylenol  with some relief. Patient's husband is also sick with similar symptoms.

## 2023-09-06 NOTE — Discharge Instructions (Addendum)
 We are treating you for sinus infection.  Start doxycycline  100 mg twice daily for 10 days.  Stay out of the sun while on this medication.  Use over-the-counter medication including nasal saline/sinus rinses, gargling with warm salt water, Flonase , Mucinex  to help manage the symptoms.  If you are not feeling better in a week please return for reevaluation.  If anything worsens and you have high fever, worsening cough, shortness of breath, nausea/vomiting interfering with oral intake, chest pain you need to be seen immediately.  Le estamos tratando por una infeccin sinusal. Comience con doxiciclina 100 mg dos veces al da durante [address]. Evite la exposicin al sol mientras est tomando este medicamento. Use medicamentos de venta libre, como enjuagues nasales con solucin salina/sinusales, grgaras con agua tibia con sal, Flonase  y Mucinex , para ayudar a controlar los sntomas. Si no se siente mejor en una semana, regrese para una reevaluacin. Si algo empeora y presenta fiebre alta, tos que [place], dificultad para respirar, nuseas/vmitos que interfieren con la ingesta oral, o dolor en el pecho, debe ser examinado de inmediato.

## 2023-09-07 ENCOUNTER — Other Ambulatory Visit (HOSPITAL_COMMUNITY): Payer: Self-pay

## 2023-09-07 ENCOUNTER — Other Ambulatory Visit: Payer: Self-pay

## 2023-09-09 ENCOUNTER — Encounter: Payer: Self-pay | Admitting: Cardiology

## 2023-09-09 NOTE — Assessment & Plan Note (Signed)
 Stable finding of dilated thoracic aorta if not improved compared to initial evaluation. At this point I think we can follow-up every couple years.  Will reassess echocardiogram next year but check both echo and CTA chest in 2027.

## 2023-09-09 NOTE — Assessment & Plan Note (Signed)
 Most recent lipids had an LDL of 62 and total cholesterol 136 with well-controlled triglycerides on a very low-dose of rosuvastatin  5 mg daily. Unfortunately her A1c remains elevated at 8.3 on a combination of Jardiance , Janumet  and glipizide . -Continue low-dose rosuvastatin  5 mg daily -Defer management of diabetes to PCP but I suspect that she may need more aggressive management to ensure improvement in A1c below 5.6.

## 2023-09-09 NOTE — Assessment & Plan Note (Signed)
 Moderate aortic valve stenosis with mean gradient 29 mmHg, stable.  Functionally bicuspid-like valve due to wear and tear with R and L cusp raphae.  Asymptomatic. - Order echocardiogram early 2026 to monitor stenosis. - Advise monitoring for symptoms: increased shortness of breath, chest pain, syncope.

## 2023-09-09 NOTE — Assessment & Plan Note (Signed)
 Inherently she would be a good candidate for GLP-1 agonist if this can be financially feasible with medication coverage. Stressed importance of increasing exercise to stay healthy and work on dietary modification.Erin Huff

## 2023-09-09 NOTE — Assessment & Plan Note (Signed)
 Slightly elevated blood pressure today with first check, likely due to cough.  Home readings 120-130 mmHg. Important to maintain to prevent aortic dilation and reduce cardiac workload. - Continue carvedilol  6.25 mg twice daily, and spironolactone  25 mg daily. - Monitor blood pressure at home. - If systolic BP >150 mmHg, recheck in one hour, take extra carvedilol  if still elevated. - If pressures remain elevated on follow-up checks, would likely add ARB

## 2023-09-14 ENCOUNTER — Ambulatory Visit: Payer: Self-pay | Attending: Family Medicine | Admitting: Pharmacist

## 2023-09-14 ENCOUNTER — Encounter: Payer: Self-pay | Admitting: Pharmacist

## 2023-09-14 DIAGNOSIS — E1165 Type 2 diabetes mellitus with hyperglycemia: Secondary | ICD-10-CM

## 2023-09-14 DIAGNOSIS — Z7984 Long term (current) use of oral hypoglycemic drugs: Secondary | ICD-10-CM

## 2023-09-14 LAB — POCT GLYCOSYLATED HEMOGLOBIN (HGB A1C): HbA1c, POC (controlled diabetic range): 7.4 % — AB (ref 0.0–7.0)

## 2023-09-14 NOTE — Progress Notes (Signed)
    S:    PCP: [name]  Patient presents for diabetes evaluation, education, and management. Patient was referred and last seen by Primary Care Provider on 05/19/2023.   At her visit with [name] in March, A1c was 8.3% (up from 8.1 prior). Her metformin  was changed to Janumet . I saw her on 06/30/2023. She endorsed adherence to her Janumet , Jardiance  , and glipizide  at that visit and I made no changes.    Today, patient arrives in good spirits and visit was completed with use of a Spanish video interpreter. ID #[id number], Biaani.  She reports she has been tolerating Janumet  well. She endorses adherence with this, Jardiance , and glipizide  today. Her Janumet  and Jardiance  are provided via MAP. The Jardiance  ships to her home but she picks up Janumet  and glipizide  from our pharmacy. A1c today shows improvement. She is at a 7.4%, down from 8.3% prior. Commended her for this!   Family/Social History:  -Denies alcohol use  -Never smoker -HTN in sister, DM in brother  Insurance coverage/medication affordability: self pay  Medication adherence reported.   Current diabetes medications include: Janumet  50-1000 mg BID, glipizide  XL 20 mg daily, Jardiance  25 mg daily   Patient denies hypoglycemic events.  Patient reported dietary habits: Eats 3 meals/day - morning, noon, and afternoon. Loves eating tortillas - cannot resist eating them. Reports eating more vegetables and whole grains. Tries to avoid meat.   Patient-reported exercise habits:  -Not as much.  Patient denies nocturia (nighttime urination). Patient denies neuropathy (nerve pain).  Patient denies visual changes.  Patient reports self foot exams.   Checks BG once daily in the morning before breakfast Reported home fasting blood sugars: gives range in the 110-120s. Denies any readings > 200 since starting Janumet .   O:   Lab Results  Component Value Date   HGBA1C 7.4 (A) 09/14/2023   Lipid Panel     Component Value Date/Time   CHOL  136 11/15/2022 1443   TRIG 143 11/15/2022 1443   HDL 49 11/15/2022 1443   CHOLHDL 2.8 11/15/2022 1443   CHOLHDL 3.7 03/01/2016 0956   VLDL 47 (H) 03/01/2016 0956   LDLCALC 62 11/15/2022 1443   Clinical Atherosclerotic Cardiovascular Disease (ASCVD): No  The 10-year ASCVD risk score (Arnett DK, et al., 2019) is: 15.1%   Values used to calculate the score:     Age: 68 years     Clincally relevant sex: Female     Is Non-Hispanic African American: No     Diabetic: Yes     Tobacco smoker: No     Systolic Blood Pressure: 136 mmHg     Is BP treated: Yes     HDL Cholesterol: 49 mg/dL     Total Cholesterol: 136 mg/dL   A/P: Diabetes longstanding currently close to goal with today's A1c 7.4. She is not currently symptomatic from a hyper- or hypoglycemia standpoint. Patient is able to verbalize appropriate hypoglycemia management plan. Medication adherence appears appropriate. Advised her to continue current medication regimen and follow-up with her PCP. -Continue Janumet , glipizide , and Jardiance  at current doses.  -Extensively discussed pathophysiology of diabetes, recommended lifestyle interventions, dietary effects on blood sugar control -Counseled on s/sx of and management of hypoglycemia -Next A1C anticipated 11/2023.   Total time in face to face counseling 30 minutes.  Follow up with me in June.   Herlene Fleeta Morris, PharmD, [name], CPP Clinical Pharmacist [facility] [phone]

## 2023-09-26 ENCOUNTER — Other Ambulatory Visit: Payer: Self-pay | Admitting: Nurse Practitioner

## 2023-09-26 ENCOUNTER — Other Ambulatory Visit: Payer: Self-pay

## 2023-09-26 DIAGNOSIS — K219 Gastro-esophageal reflux disease without esophagitis: Secondary | ICD-10-CM

## 2023-09-27 ENCOUNTER — Other Ambulatory Visit: Payer: Self-pay

## 2023-09-29 ENCOUNTER — Other Ambulatory Visit: Payer: Self-pay

## 2023-09-29 ENCOUNTER — Other Ambulatory Visit: Payer: Self-pay | Admitting: Pharmacist

## 2023-09-29 DIAGNOSIS — K219 Gastro-esophageal reflux disease without esophagitis: Secondary | ICD-10-CM

## 2023-09-29 MED ORDER — OMEPRAZOLE 20 MG PO CPDR
20.0000 mg | DELAYED_RELEASE_CAPSULE | Freq: Every day | ORAL | 1 refills | Status: DC | PRN
  Filled 2023-09-29: qty 90, 90d supply, fill #0

## 2023-10-06 ENCOUNTER — Other Ambulatory Visit: Payer: Self-pay

## 2023-10-10 ENCOUNTER — Other Ambulatory Visit: Payer: Self-pay

## 2023-10-17 ENCOUNTER — Telehealth: Payer: Self-pay | Admitting: Nurse Practitioner

## 2023-10-17 NOTE — Telephone Encounter (Signed)
 Pt confirmed appt 8/26

## 2023-10-18 ENCOUNTER — Ambulatory Visit: Payer: Self-pay | Attending: Nurse Practitioner | Admitting: Nurse Practitioner

## 2023-10-18 ENCOUNTER — Encounter: Payer: Self-pay | Admitting: Nurse Practitioner

## 2023-10-18 ENCOUNTER — Other Ambulatory Visit: Payer: Self-pay

## 2023-10-18 VITALS — BP 149/73 | HR 62 | Resp 18 | Ht 60.0 in | Wt 165.8 lb

## 2023-10-18 DIAGNOSIS — B977 Papillomavirus as the cause of diseases classified elsewhere: Secondary | ICD-10-CM

## 2023-10-18 DIAGNOSIS — Z79890 Hormone replacement therapy (postmenopausal): Secondary | ICD-10-CM

## 2023-10-18 DIAGNOSIS — Z79899 Other long term (current) drug therapy: Secondary | ICD-10-CM

## 2023-10-18 DIAGNOSIS — E039 Hypothyroidism, unspecified: Secondary | ICD-10-CM

## 2023-10-18 DIAGNOSIS — I10 Essential (primary) hypertension: Secondary | ICD-10-CM

## 2023-10-18 DIAGNOSIS — K219 Gastro-esophageal reflux disease without esophagitis: Secondary | ICD-10-CM

## 2023-10-18 DIAGNOSIS — R399 Unspecified symptoms and signs involving the genitourinary system: Secondary | ICD-10-CM

## 2023-10-18 DIAGNOSIS — E7800 Pure hypercholesterolemia, unspecified: Secondary | ICD-10-CM

## 2023-10-18 DIAGNOSIS — Z7984 Long term (current) use of oral hypoglycemic drugs: Secondary | ICD-10-CM

## 2023-10-18 DIAGNOSIS — E119 Type 2 diabetes mellitus without complications: Secondary | ICD-10-CM

## 2023-10-18 DIAGNOSIS — R309 Painful micturition, unspecified: Secondary | ICD-10-CM

## 2023-10-18 DIAGNOSIS — N898 Other specified noninflammatory disorders of vagina: Secondary | ICD-10-CM

## 2023-10-18 MED ORDER — LEVOTHYROXINE SODIUM 100 MCG PO TABS
100.0000 ug | ORAL_TABLET | Freq: Every day | ORAL | 2 refills | Status: AC
  Filled 2023-10-18 – 2023-10-31 (×2): qty 90, 90d supply, fill #0
  Filled 2024-01-25: qty 90, 90d supply, fill #1

## 2023-10-18 MED ORDER — ROSUVASTATIN CALCIUM 5 MG PO TABS
5.0000 mg | ORAL_TABLET | Freq: Every day | ORAL | 2 refills | Status: AC
Start: 2023-10-18 — End: ?
  Filled 2023-10-18: qty 90, 90d supply, fill #0

## 2023-10-18 MED ORDER — OMEPRAZOLE 20 MG PO CPDR
20.0000 mg | DELAYED_RELEASE_CAPSULE | Freq: Every day | ORAL | 1 refills | Status: AC | PRN
  Filled 2023-10-18: qty 90, 90d supply, fill #0

## 2023-10-18 NOTE — Progress Notes (Signed)
 Assessment & Plan:  Erin Huff was seen today for hypertension.  Diagnoses and all orders for this visit:  Primary hypertension -     CMP14+EGFR Continue all antihypertensives as prescribed.  Reminded to bring in blood pressure log for follow  up appointment.  RECOMMENDATIONS: DASH/Mediterranean Diets are healthier choices for HTN.    Acquired hypothyroidism -     levothyroxine  (SYNTHROID ) 100 MCG tablet; Take 1 tablet (100 mcg total) by mouth daily before breakfast.  Gastroesophageal reflux disease without esophagitis -     omeprazole  (PRILOSEC) 20 MG capsule; Take 1 capsule (20 mg total) by mouth daily as needed. PARA ACIDO  Painful urination -     Urine Culture -     Urinalysis, Complete -     Cancel: Ambulatory referral to Gynecology -     Ambulatory referral to Gynecology Based on lab results we may need to stop jardiance   UTI symptoms Based on lab results we may need to stop jardiance  -     Ambulatory referral to Gynecology  Vaginal irritation Based on lab results we may need to stop jardiance  -     Ambulatory referral to Gynecology  HPV (human papilloma virus) infection -     Ambulatory referral to Gynecology  Pure hypercholesterolemia -     rosuvastatin  (CRESTOR ) 5 MG tablet; Take 1 tablet (5 mg total) by mouth daily. FOR CHOLESTEROL    Patient has been counseled on age-appropriate routine health concerns for screening and prevention. These are reviewed and up-to-date. Referrals have been placed accordingly. Immunizations are up-to-date or declined.    Subjective:   Chief Complaint  Patient presents with   Hypertension    Erin Huff 68 y.o. female presents to office today for follow up to HTN  VRI was used to communicate directly with patient for the entire encounter including providing detailed patient instructions.     She is experiencing frequent symptoms of painful urination, vaginal irritation and burning. Urine samples have been  negative. She has a history of positive High risk HPV 09-2022. Needs repeat pap smear. Will refer to GYN for PAP and recurrent UTI symptoms. We may need to stop Jardiance  due to frequent GU symptoms.    HTN Blood pressure is elevated today. She is currently prescribed carvedilol  6.25 mg BID BP Readings from Last 3 Encounters:  10/18/23 (!) 149/73  09/06/23 123/73  09/09/23 136/74    Hypothyroidism Thyroid  level at goal with levothyroxine  100 mg taken daily as prescribed. She does not endorse any symptoms of hypo or hyperthyroidism Lab Results  Component Value Date   TSH 1.400 02/17/2023      DM 2 We may need to look at GLP-1 if no improvement in next A1c.  She is currently prescribed Janumet  50-1000 mg daily, glipizide  20 mg daily and Jardiance  25 mg daily. Jardiance  may also need to be stopped due to recurring GU symptoms. Lab Results  Component Value Date   HGBA1C 7.4 (A) 09/14/2023    Review of Systems  Constitutional:  Negative for fever, malaise/fatigue and weight loss.  HENT: Negative.  Negative for nosebleeds.   Eyes: Negative.  Negative for blurred vision, double vision and photophobia.  Respiratory: Negative.  Negative for cough and shortness of breath.   Cardiovascular: Negative.  Negative for chest pain, palpitations and leg swelling.  Gastrointestinal: Negative.  Negative for heartburn, nausea and vomiting.  Genitourinary:  Positive for dysuria.       SEE HPI  Musculoskeletal: Negative.  Negative for myalgias.  Neurological: Negative.  Negative for dizziness, focal weakness, seizures and headaches.  Psychiatric/Behavioral: Negative.  Negative for suicidal ideas.     Past Medical History:  Diagnosis Date   Allergy    Depression    mild   Diabetes mellitus without complication (HCC)    GERD (gastroesophageal reflux disease)    Hyperlipidemia    Hypertension    Moderate to severe aortic stenosis 03/2011   Moderate to severe AS noted on echo with mean gradient  27 mmHg.  Also noted moderate dilation of the ascending aorta-46 mm.   Thoracic aortic aneurysm (HCC) 04/06/2021   Measuring 46 mm on Echo; CTA chest-Aorta 08/10/2021: 4.2 x 4.2 Ascending Aortic dilation => f/u CTA Chest 05/2023:  Aorta diameter 3.8-3.9 cm proximally and 3.9 x 3.5 distally   Thyroid  disease     Past Surgical History:  Procedure Laterality Date   NO PAST SURGERIES     TRANSTHORACIC ECHOCARDIOGRAM  04/05/2021   EF 60 to 65%.  GR 1 DD.  Normal RV size and mildly elevated RVSP.  Normal RAP.  Moderate to Severe Aortic Stenosis.  Mean gradient 27 mmHg.  Moderate dilation of the ascending aorta measuring 46 mm.  Recommend CTA   TRANSTHORACIC ECHOCARDIOGRAM  03/23/2023   Echocardiogram: LVEF 60-65%, no RWMA.  Mild LVH.  Normal diastolic parameters.  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  Mild ascending aortic dilation-44 mm.  Normal RAP.  Normal RV size and function.    Family History  Problem Relation Age of Onset   Hypertension Sister    Diabetes Brother    Breast cancer Maternal Aunt    Colon cancer Neg Hx    Colon polyps Neg Hx    Esophageal cancer Neg Hx    Rectal cancer Neg Hx    Stomach cancer Neg Hx     Social History Reviewed with no changes to be made today.   Outpatient Medications Prior to Visit  Medication Sig Dispense Refill   carvedilol  (COREG ) 6.25 MG tablet Take 1 tablet (6.25 mg total) by mouth 2 (two) times daily with a meal. 180 tablet 3   glipiZIDE  (GLUCOTROL  XL) 10 MG 24 hr tablet Take 2 tablets (20 mg total) by mouth daily with breakfast. 180 tablet 1   sitaGLIPtin -metformin  (JANUMET ) 50-1000 MG tablet Take 1 tablet by mouth 2 (two) times daily with a meal. 180 tablet 1   spironolactone  (ALDACTONE ) 25 MG tablet Take 1 tablet (25 mg total) by mouth daily. 90 tablet 1   triamcinolone  ointment (KENALOG ) 0.5 % Apply 1 Application topically 2 (two) times daily. 60 g 1    TRUEplus Lancets 28G MISC Use as instructed. Check blood glucose level by fingerstick twice per day. E11.65 100 each 5   doxycycline  (VIBRAMYCIN ) 100 MG capsule Take 1 capsule (100 mg total) by mouth 2 (two) times daily. 20 capsule 0   levothyroxine  (SYNTHROID ) 100 MCG tablet Take 1 tablet (100 mcg total) by mouth daily before breakfast. 90 tablet 2   Blood Glucose Monitoring Suppl (TRUE METRIX METER) w/Device KIT Use as directed 1 kit 0   Blood Glucose Monitoring Suppl (TRUE METRIX METER) w/Device KIT Use as instructed. Check blood glucose level by fingerstick twice per day.  E11.65 1 kit 0   empagliflozin  (JARDIANCE ) 25 MG TABS tablet Take 1 tablet (25 mg total) by mouth daily before breakfast. For diabetes 90 tablet 1   glucose blood (  TRUE METRIX BLOOD GLUCOSE TEST) test strip USE AS INSTRUCTED. 100 strip 3   lidocaine  (LIDODERM ) 5 % Place 1 patch onto the skin daily. Remove & Discard patch within 12 hours or as directed by MD 30 patch 0   Safety Seal Miscellaneous MISC Melaxemic Cream with tranexamic acid 5% kojic acid USP 2% vit c USP 2.5% hyaluronic acid EXCP 0.1% use on areas twice a day (Patient not taking: Reported on 10/18/2023) 15 g 0   omeprazole  (PRILOSEC) 20 MG capsule Take 1 capsule (20 mg total) by mouth daily as needed. PARA ACIDO (Patient not taking: Reported on 10/18/2023) 90 capsule 1   phenazopyridine  (PYRIDIUM ) 100 MG tablet Take 1 tablet (100 mg total) by mouth 3 (three) times daily as needed for pain. With urination (Patient not taking: Reported on 10/18/2023) 10 tablet 0   rosuvastatin  (CRESTOR ) 5 MG tablet Take 1 tablet (5 mg total) by mouth daily. (Patient not taking: Reported on 10/18/2023) 90 tablet 2   No facility-administered medications prior to visit.    Allergies  Allergen Reactions   Motrin  [Ibuprofen ] Rash   Penicillins Rash       Objective:    BP (!) 149/73 (BP Location: Left Arm, Patient Position: Sitting, Cuff Size: Normal)   Pulse 62   Resp 18   Ht 5'  (1.524 m)   Wt 165 lb 12.8 oz (75.2 kg)   SpO2 98%   BMI 32.38 kg/m  Wt Readings from Last 3 Encounters:  10/18/23 165 lb 12.8 oz (75.2 kg)  09/05/23 170 lb (77.1 kg)  05/19/23 166 lb 9.6 oz (75.6 kg)    Physical Exam Vitals and nursing note reviewed.  Constitutional:      Appearance: She is well-developed.  HENT:     Head: Normocephalic and atraumatic.  Cardiovascular:     Rate and Rhythm: Normal rate and regular rhythm.     Heart sounds: Normal heart sounds. No murmur heard.    No friction rub. No gallop.  Pulmonary:     Effort: Pulmonary effort is normal. No tachypnea or respiratory distress.     Breath sounds: Normal breath sounds. No decreased breath sounds, wheezing, rhonchi or rales.  Chest:     Chest wall: No tenderness.  Musculoskeletal:        General: Normal range of motion.     Cervical back: Normal range of motion.  Skin:    General: Skin is warm and dry.  Neurological:     Mental Status: She is alert and oriented to person, place, and time.     Coordination: Coordination normal.  Psychiatric:        Behavior: Behavior normal. Behavior is cooperative.        Thought Content: Thought content normal.        Judgment: Judgment normal.          Patient has been counseled extensively about nutrition and exercise as well as the importance of adherence with medications and regular follow-up. The patient was given clear instructions to go to ER or return to medical center if symptoms don't improve, worsen or new problems develop. The patient verbalized understanding.   Follow-up: Return in about 3 months (around 01/26/2024).   Haze LELON Servant, FNP-BC [facility] [place], [place] [phone]   10/18/2023, 5:11 PM

## 2023-10-19 ENCOUNTER — Other Ambulatory Visit: Payer: Self-pay

## 2023-10-19 LAB — MICROSCOPIC EXAMINATION
Bacteria, UA: NONE SEEN
Casts: NONE SEEN /LPF
Epithelial Cells (non renal): NONE SEEN /HPF (ref 0–10)
WBC, UA: NONE SEEN /HPF (ref 0–5)

## 2023-10-19 LAB — CMP14+EGFR
ALT: 15 IU/L (ref 0–32)
AST: 19 IU/L (ref 0–40)
Albumin: 4.6 g/dL (ref 3.9–4.9)
Alkaline Phosphatase: 121 IU/L (ref 44–121)
BUN/Creatinine Ratio: 23 (ref 12–28)
BUN: 19 mg/dL (ref 8–27)
Bilirubin Total: 0.3 mg/dL (ref 0.0–1.2)
CO2: 23 mmol/L (ref 20–29)
Calcium: 9.7 mg/dL (ref 8.7–10.3)
Chloride: 101 mmol/L (ref 96–106)
Creatinine, Ser: 0.81 mg/dL (ref 0.57–1.00)
Globulin, Total: 3.2 g/dL (ref 1.5–4.5)
Glucose: 118 mg/dL — ABNORMAL HIGH (ref 70–99)
Potassium: 4.4 mmol/L (ref 3.5–5.2)
Sodium: 138 mmol/L (ref 134–144)
Total Protein: 7.8 g/dL (ref 6.0–8.5)
eGFR: 80 mL/min/1.73 (ref >59)

## 2023-10-19 LAB — URINALYSIS, COMPLETE
Bilirubin, UA: NEGATIVE
Ketones, UA: NEGATIVE
Nitrite, UA: NEGATIVE
Protein,UA: NEGATIVE
RBC, UA: NEGATIVE
Specific Gravity, UA: 1.008 (ref 1.005–1.030)
Urobilinogen, Ur: 0.2 mg/dL (ref 0.2–1.0)
pH, UA: 7 (ref 5.0–7.5)

## 2023-10-20 ENCOUNTER — Other Ambulatory Visit: Payer: Self-pay

## 2023-10-20 LAB — URINE CULTURE

## 2023-10-25 ENCOUNTER — Other Ambulatory Visit: Payer: Self-pay

## 2023-10-29 ENCOUNTER — Ambulatory Visit: Payer: Self-pay | Admitting: Nurse Practitioner

## 2023-10-30 ENCOUNTER — Other Ambulatory Visit: Payer: Self-pay

## 2023-10-31 ENCOUNTER — Other Ambulatory Visit (HOSPITAL_COMMUNITY): Payer: Self-pay

## 2023-10-31 ENCOUNTER — Other Ambulatory Visit: Payer: Self-pay

## 2023-11-09 ENCOUNTER — Other Ambulatory Visit: Payer: Self-pay

## 2023-11-09 ENCOUNTER — Ambulatory Visit: Payer: Self-pay

## 2023-11-10 IMAGING — CR DG CHEST 2V
3 series · 3 of 3 positions shown · non-contrast
Comparison: X-ray chest 07/15/2015.

CLINICAL DATA: Heart murmur.

EXAM:
CHEST - 2 VIEW

[w chest pa (1 of 2)]
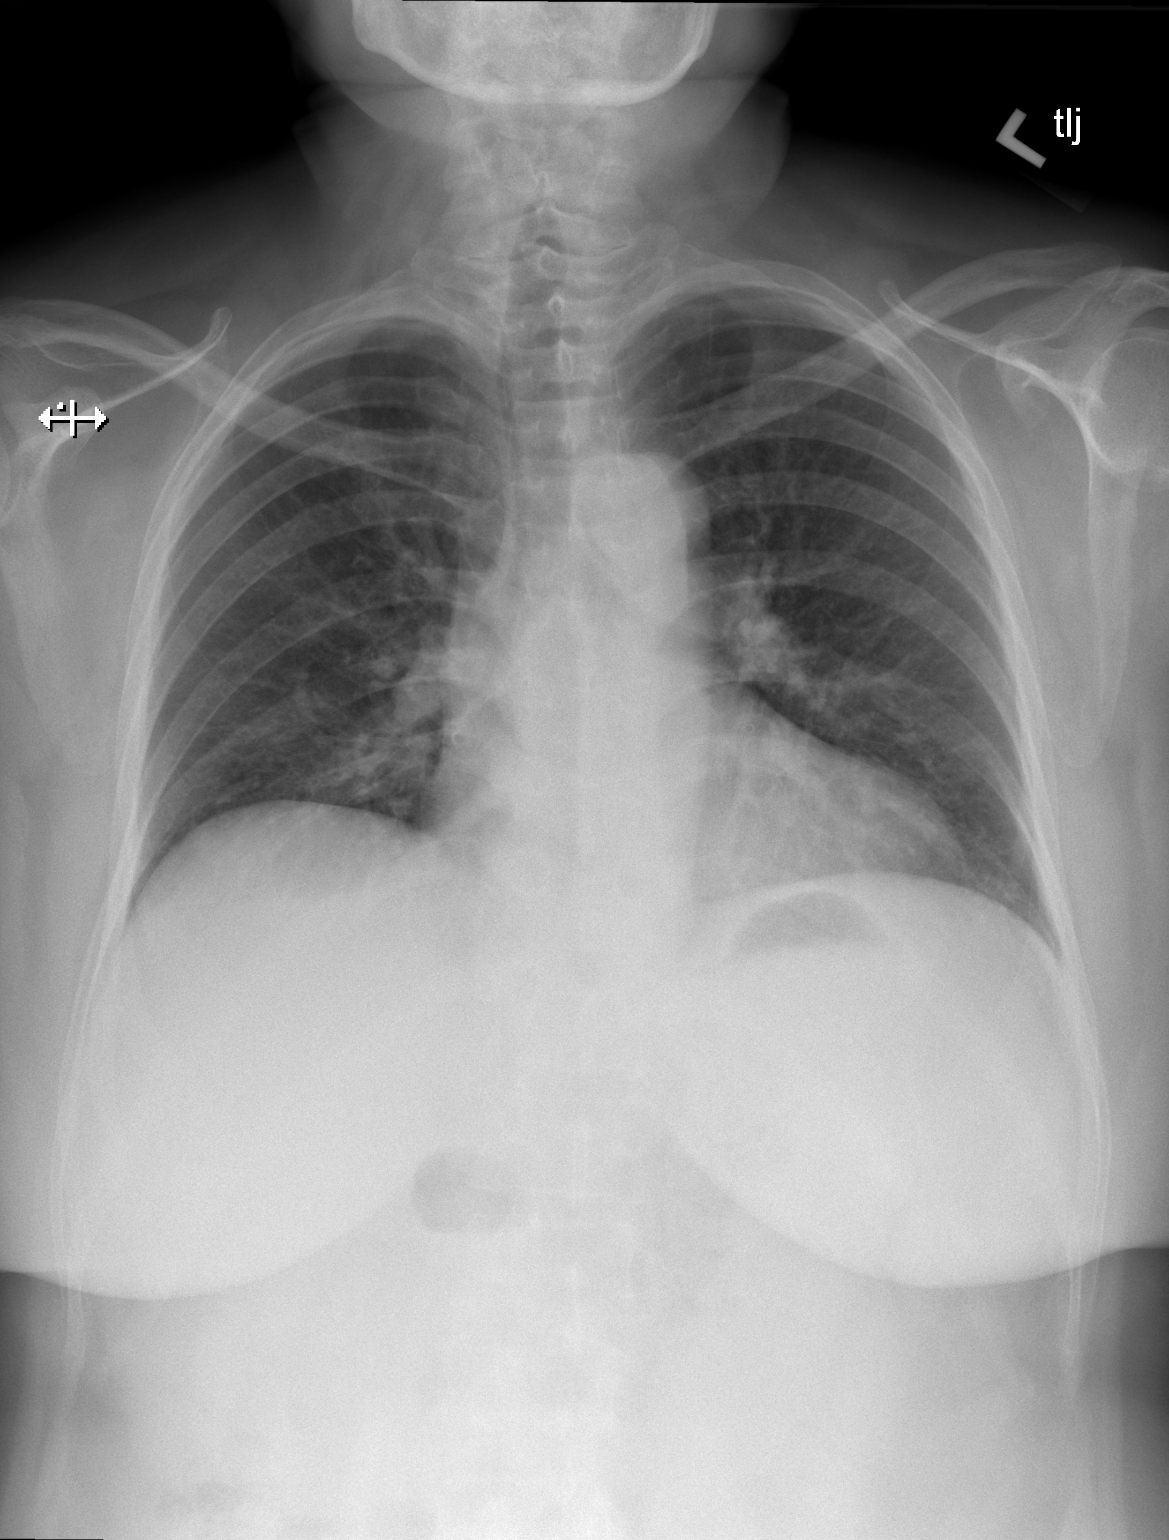

[w chest pa (2 of 2)]
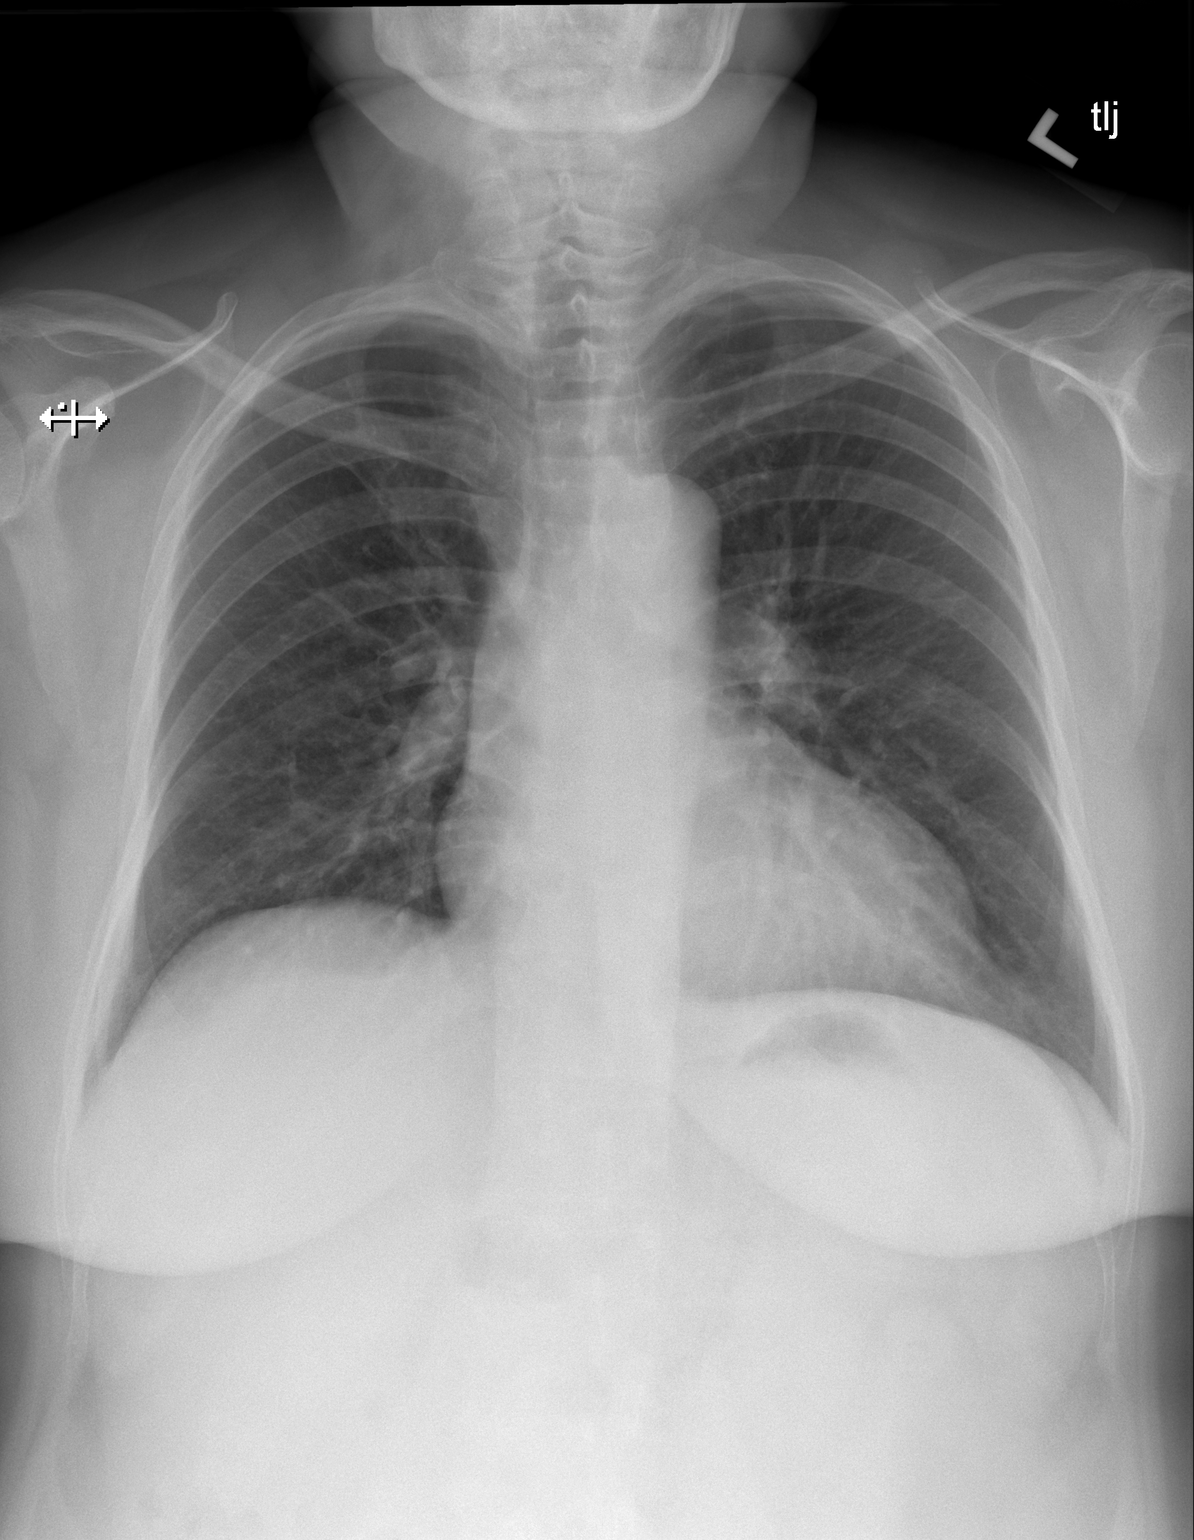

[w chest lat]
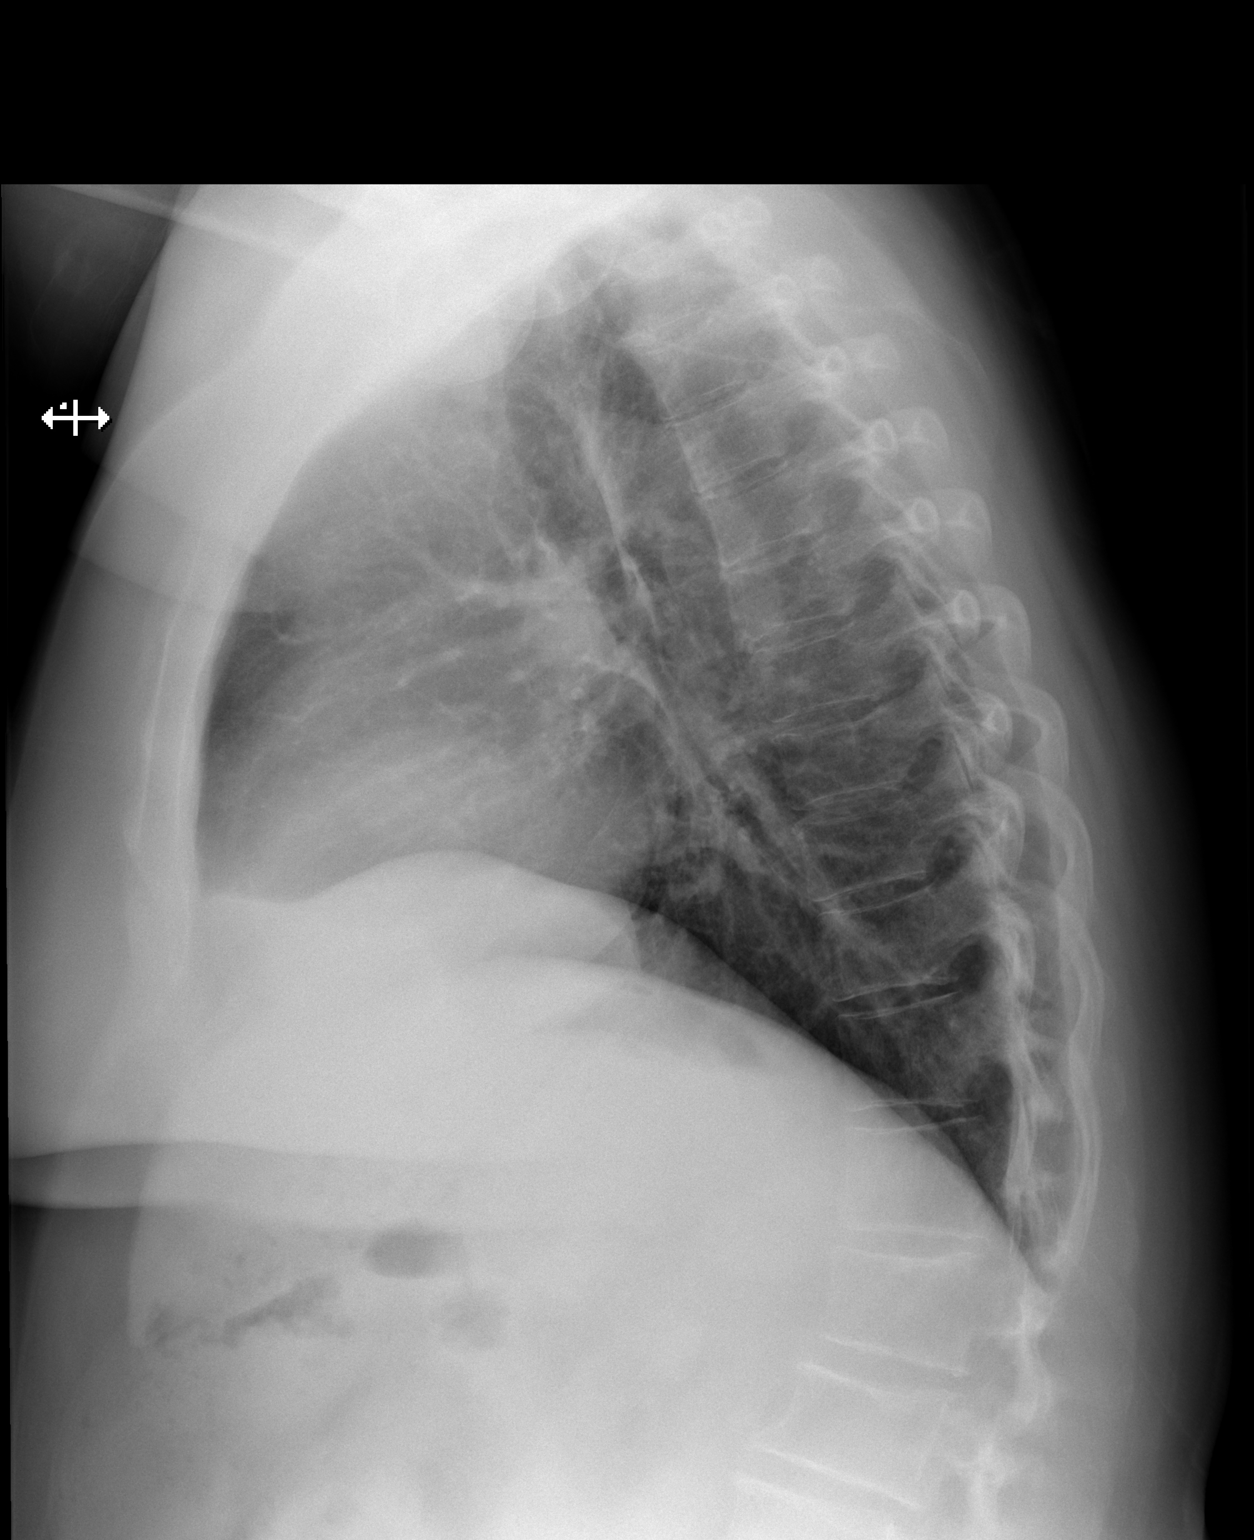

[3 of 3 positions shown; findings below may reference images not displayed]

FINDINGS: Unchanged cardiomediastinal silhouette. There is no focal airspace
consolidation. There is no large pleural effusion. There is no
visible pneumothorax. There is no acute osseous abnormality.
Thoracic spondylosis.
IMPRESSION: No evidence of acute cardiopulmonary disease.

## 2023-11-13 ENCOUNTER — Other Ambulatory Visit: Payer: Self-pay

## 2023-11-14 ENCOUNTER — Other Ambulatory Visit: Payer: Self-pay | Admitting: Nurse Practitioner

## 2023-11-14 ENCOUNTER — Other Ambulatory Visit: Payer: Self-pay

## 2023-11-14 DIAGNOSIS — E1165 Type 2 diabetes mellitus with hyperglycemia: Secondary | ICD-10-CM

## 2023-11-15 ENCOUNTER — Telehealth: Payer: Self-pay | Admitting: Nurse Practitioner

## 2023-11-15 NOTE — Telephone Encounter (Signed)
 Confirmed appt for 9/25

## 2023-11-16 ENCOUNTER — Other Ambulatory Visit: Payer: Self-pay | Admitting: Pharmacist

## 2023-11-16 ENCOUNTER — Other Ambulatory Visit: Payer: Self-pay

## 2023-11-16 ENCOUNTER — Ambulatory Visit: Payer: Self-pay | Attending: Nurse Practitioner | Admitting: Pharmacist

## 2023-11-16 ENCOUNTER — Encounter: Payer: Self-pay | Admitting: Pharmacist

## 2023-11-16 DIAGNOSIS — E1165 Type 2 diabetes mellitus with hyperglycemia: Secondary | ICD-10-CM

## 2023-11-16 DIAGNOSIS — Z7984 Long term (current) use of oral hypoglycemic drugs: Secondary | ICD-10-CM

## 2023-11-16 MED ORDER — VITAMIN D (ERGOCALCIFEROL) 1.25 MG (50000 UNIT) PO CAPS
50000.0000 [IU] | ORAL_CAPSULE | ORAL | 0 refills | Status: DC
  Filled 2023-11-16: qty 12, 84d supply, fill #0

## 2023-11-16 NOTE — Progress Notes (Signed)
    S:    PCP: Erin Huff  Patient presents for diabetes evaluation, education, and management. Patient was referred and last seen by Primary Care Provider on 10/18/2023.   At her visit with me in July, A1c was 7.4%. With Erin Huff last month, she endorsed symptoms of UTI and Jardiance . Jardiance  was discontinued.  Today, patient arrives in good spirits and visit was completed with use of a Spanish video interpreter. ID #[id number], Erin Huff.  She reports that since she stopped Jardiance  her urinary symptoms have resolved. She has been tolerating Janumet  well. She endorses adherence with this and glipizide . Her Janumet  is provided via MAP.   Family/Social History:  -Denies alcohol use  -Never smoker -HTN in sister, DM in brother  Insurance coverage/medication affordability: self pay  Medication adherence reported.   Current diabetes medications include: Janumet  50-1000 mg BID, glipizide  XL 20 mg daily  Patient denies hypoglycemic events.  Patient reported dietary habits: Eats 3 meals/day - morning, noon, and afternoon. Loves eating tortillas - cannot resist eating them. Reports eating more vegetables and whole grains. Tries to avoid meat.   Patient-reported exercise habits:  -Not as much.  Patient denies nocturia (nighttime urination). Patient denies neuropathy (nerve pain).  Patient denies visual changes.  Patient reports self foot exams.   Checks BG once daily in the morning before breakfast Reported home fasting blood sugars: gives range in the 120s-140s. Denies any readings > 200.  O:   Lab Results  Component Value Date   HGBA1C 7.4 (A) 09/14/2023   Lipid Panel     Component Value Date/Time   CHOL 136 11/15/2022 1443   TRIG 143 11/15/2022 1443   HDL 49 11/15/2022 1443   CHOLHDL 2.8 11/15/2022 1443   CHOLHDL 3.7 03/01/2016 0956   VLDL 47 (H) 03/01/2016 0956   LDLCALC 62 11/15/2022 1443   Clinical Atherosclerotic Cardiovascular Disease (ASCVD): No  The 10-year ASCVD risk  score (Arnett DK, et al., 2019) is: 17.9%   Values used to calculate the score:     Age: 68 years     Clincally relevant sex: Female     Is Non-Hispanic African American: No     Diabetic: Yes     Tobacco smoker: No     Systolic Blood Pressure: 149 mmHg     Is BP treated: Yes     HDL Cholesterol: 49 mg/dL     Total Cholesterol: 136 mg/dL   A/P: Diabetes longstanding currently close to goal with A1c 7.4% in July. Reported home sugars look okay. She is not currently symptomatic from a hyper- or hypoglycemia standpoint. Patient is able to verbalize appropriate hypoglycemia management plan. Medication adherence appears appropriate. Advised her to continue current medication regimen and follow-up with her PCP. -Continue Janumet , glipizide  at current doses.  -Extensively discussed pathophysiology of diabetes, recommended lifestyle interventions, dietary effects on blood sugar control -Counseled on s/sx of and management of hypoglycemia -Next A1C anticipated 11/2023.   Total time in face to face counseling 30 minutes.  Follow up with me in 1 month. PCP in 01/2024.   Erin Huff, PharmD, Erin Huff, CPP Clinical Pharmacist [facility] [phone]

## 2023-11-27 ENCOUNTER — Other Ambulatory Visit: Payer: Self-pay

## 2023-12-12 ENCOUNTER — Ambulatory Visit
Admission: RE | Admit: 2023-12-12 | Discharge: 2023-12-12 | Disposition: A | Discharge status: Home / Self Care | Payer: Self-pay | Source: Ambulatory Visit | Attending: Obstetrics and Gynecology | Admitting: Obstetrics and Gynecology

## 2023-12-12 ENCOUNTER — Ambulatory Visit: Payer: Self-pay | Admitting: *Deleted

## 2023-12-12 VITALS — BP 132/70 | Wt 168.0 lb

## 2023-12-12 DIAGNOSIS — Z01419 Encounter for gynecological examination (general) (routine) without abnormal findings: Secondary | ICD-10-CM

## 2023-12-12 DIAGNOSIS — Z1231 Encounter for screening mammogram for malignant neoplasm of breast: Secondary | ICD-10-CM

## 2023-12-12 DIAGNOSIS — Z1211 Encounter for screening for malignant neoplasm of colon: Secondary | ICD-10-CM

## 2023-12-12 NOTE — Progress Notes (Signed)
 [name] is a 68 y.o. G5P0 female who presents to [facility] clinic today with no complaints.    Pap Smear: Pap smear completed today. Last Pap smear was 10/06/2022 at [facility] clinic and was normal with positive HPV that no follow up was completed. 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 Last four Pap smear results is available in Epic.   Physical exam: Breasts Breasts symmetrical. No skin abnormalities bilateral breasts. No nipple retraction bilateral breasts. No nipple discharge bilateral breasts. No lymphadenopathy. No lumps palpated bilateral breasts. No complaints of pain or tenderness on exam.      MM 3D SCREENING MAMMOGRAM BILATERAL BREAST Result Date: 10/10/2022 CLINICAL DATA:  Screening. EXAM: DIGITAL SCREENING BILATERAL MAMMOGRAM WITH TOMOSYNTHESIS AND CAD TECHNIQUE: Bilateral screening digital craniocaudal and mediolateral oblique mammograms were obtained. Bilateral screening digital breast tomosynthesis was performed. The images were evaluated with computer-aided detection. COMPARISON:  Previous exam(s). ACR Breast Density Category b: There are scattered areas of fibroglandular density. FINDINGS: There are no findings suspicious for malignancy. IMPRESSION: No mammographic evidence of malignancy. A result letter of this screening mammogram will be mailed directly to the patient. RECOMMENDATION: Screening mammogram in one year. (Code:SM-B-01Y) BI-RADS CATEGORY  1: Negative. Electronically Signed   By: Reyes Phi M.D.   On: 10/10/2022 13:22   MS DIGITAL SCREENING TOMO BILATERAL Result Date: 09/03/2021 CLINICAL DATA:  Screening. EXAM:  DIGITAL SCREENING BILATERAL MAMMOGRAM WITH TOMOSYNTHESIS AND CAD TECHNIQUE: Bilateral screening digital craniocaudal and mediolateral oblique mammograms were obtained. Bilateral screening digital breast tomosynthesis was performed. The images were evaluated with computer-aided detection. COMPARISON:  Previous exam(s). ACR Breast Density Category b: There are scattered areas of fibroglandular density. FINDINGS: There are no findings suspicious for malignancy. IMPRESSION: No mammographic evidence of malignancy. A result letter of this screening mammogram will be mailed directly to the patient. RECOMMENDATION: Screening mammogram in one year. (Code:SM-B-01Y) BI-RADS CATEGORY  1: Negative. Electronically Signed   By: Dobrinka  Dimitrova M.D.   On: 09/03/2021 14:19   MS DIGITAL SCREENING TOMO BILATERAL Result Date: 03/25/2020 CLINICAL DATA:  Screening. EXAM: DIGITAL SCREENING BILATERAL MAMMOGRAM WITH TOMO AND CAD COMPARISON:  Previous exam(s). ACR Breast Density Category b: There are scattered areas of fibroglandular density. FINDINGS: There are no findings suspicious for malignancy. The images were evaluated with computer-aided detection. IMPRESSION: No mammographic evidence of malignancy. A result letter of this screening mammogram will be mailed directly to the patient. RECOMMENDATION: Screening mammogram in one year. (Code:SM-B-01Y) BI-RADS CATEGORY  1: Negative. Electronically Signed   By: Lael Hines M.D.   On: 03/25/2020 10:44   MS DIGITAL SCREENING TOMO BILATERAL Result Date: 01/15/2019 CLINICAL DATA:  Screening. EXAM: DIGITAL SCREENING BILATERAL MAMMOGRAM WITH TOMO AND CAD COMPARISON:  Previous exam(s). ACR Breast Density Category b: There are scattered areas of fibroglandular density. FINDINGS: There are no findings suspicious for malignancy. Images were processed with CAD. IMPRESSION: No mammographic evidence of malignancy. A result letter of this screening mammogram will be mailed directly to the  patient. RECOMMENDATION: Screening mammogram in one year. (Code:SM-B-01Y) BI-RADS CATEGORY  1: Negative. Electronically Signed   By: Bard Moats M.D.   On: 01/15/2019 10:26  Pelvic/Bimanual Ext Genitalia No lesions, no swelling and no discharge observed on external genitalia.        Vagina Vagina pink and normal texture. No lesions or discharge observed in vagina.        Cervix Cervix is present. Cervix pink and of normal texture. No discharge observed.    Uterus Uterus is present and palpable. Uterus in normal position and normal size.        Adnexae Bilateral ovaries present and palpable. No tenderness on palpation.         Rectovaginal No rectal exam completed today since patient had no rectal complaints. No skin abnormalities observed on exam.     Smoking History: Patient has never smoked.   Patient Navigation: Patient education provided. Access to services provided for patient through [facility] program. Spanish interpreter Erin Huff from [facility] provided.   Colorectal Cancer Screening: Patient had a colonoscopy completed 08/02/2016. FIT Test given to patient to complete. No complaints today.    Breast and Cervical Cancer Risk Assessment: Patient has a family history of a maternal aunt being diagnosed with breast cancer. Patient has no known genetic mutations or history radiation treatment to the chest before age 17. Patient has history of cervical dysplasia. Patient has no history of being immunocompromised or DES exposure in-utero.   Risk Scores as of Encounter on 12/12/2023     [name]           5-year 1.65%   Lifetime 5.94%   This patient is Hisp[place]/Latina but has no documented birth country, so the [employer] model used data from [place] patients to calculate their risk score. Document a birth country in the Demographics activity for a more accurate score.         Last calculated by Erin Huff, CMA on 12/12/2023 at  3:07 PM        A: BCCCP exam with pap  smear No complaints.  P: Referred [facility] to the [facility] for a screening mammogram on mobile unit. Appointment scheduled Tuesday, December 12, 2023 at 1530.  Driscilla Wanda SQUIBB, RN 12/12/2023 3:27 PM

## 2023-12-12 NOTE — Patient Instructions (Signed)
 Explained breast self awareness with Erin Huff. Pap smear completed today. Let her know if today's Pap smear is normal and HPV negative that her next Pap smear will be due in one year. Ref[facility] to the [facility] for a screening mammogram on mobile unit. Appointment scheduled Tuesday, December 12, 2023 at 1530. Patient aware of appointment and will be there. Let patient know will follow up with her within the next couple weeks with results of Pap smear by letter or phone. Informed patient that the Breast Center will follow up with her within the next couple of weeks with results of mammogram by letter or phone. Erin Huff verbalized understanding.  Andalyn Heckstall, Wanda Ship, RN 3:27 PM

## 2023-12-14 LAB — CYTOLOGY - PAP
Comment: NEGATIVE
Diagnosis: NEGATIVE
High risk HPV: NEGATIVE

## 2023-12-15 ENCOUNTER — Telehealth: Payer: Self-pay

## 2023-12-15 NOTE — Telephone Encounter (Signed)
 Attempted to contact patient regarding lab (pap) results. Via, [facility] # [phone] Services, Left message on voicemail requesting a return call.

## 2023-12-18 ENCOUNTER — Ambulatory Visit: Payer: Self-pay

## 2023-12-19 ENCOUNTER — Other Ambulatory Visit: Payer: Self-pay | Admitting: Nurse Practitioner

## 2023-12-19 DIAGNOSIS — E1165 Type 2 diabetes mellitus with hyperglycemia: Secondary | ICD-10-CM

## 2023-12-21 ENCOUNTER — Ambulatory Visit: Payer: Self-pay | Admitting: Pharmacist

## 2023-12-21 ENCOUNTER — Ambulatory Visit: Payer: Self-pay | Attending: Nurse Practitioner | Admitting: Pharmacist

## 2023-12-21 ENCOUNTER — Encounter: Payer: Self-pay | Admitting: Pharmacist

## 2023-12-21 ENCOUNTER — Other Ambulatory Visit: Payer: Self-pay

## 2023-12-21 DIAGNOSIS — Z7984 Long term (current) use of oral hypoglycemic drugs: Secondary | ICD-10-CM

## 2023-12-21 DIAGNOSIS — E119 Type 2 diabetes mellitus without complications: Secondary | ICD-10-CM

## 2023-12-21 DIAGNOSIS — Z23 Encounter for immunization: Secondary | ICD-10-CM

## 2023-12-21 DIAGNOSIS — E1165 Type 2 diabetes mellitus with hyperglycemia: Secondary | ICD-10-CM

## 2023-12-21 LAB — POCT GLYCOSYLATED HEMOGLOBIN (HGB A1C): HbA1c, POC (controlled diabetic range): 7.5 % — AB (ref 0.0–7.0)

## 2023-12-21 NOTE — Progress Notes (Signed)
    S:    PCP: [name]  Patient presents for diabetes evaluation, education, and management. Patient was referred and last seen by Primary Care Provider on 10/18/2023. At that visit, pt endorsed symptoms of UTI and Jardiance . Jardiance  was discontinued.  Today, patient arrives in good spirits and visit was completed with use of a Spanish video interpreter. ID #[id number], [name].  Her A1c today is 7.5%. She endorses adherence to her Janumet  and glipizide . Denies any complaints.   Family/Social History:  -Denies alcohol use  -Never smoker -HTN in sister, DM in brother  Insurance coverage/medication affordability: self pay  Medication adherence reported.   Current diabetes medications include: Janumet  50-1000 mg BID, glipizide  XL 20 mg daily  Patient denies hypoglycemic events.  Patient reported dietary habits: Eats 3 meals/day - morning, noon, and afternoon. Loves eating tortillas - cannot resist eating them. Reports eating more vegetables and whole grains. Tries to avoid meat.   Patient-reported exercise habits:  -Not as much.  Patient denies nocturia (nighttime urination). Patient denies neuropathy (nerve pain).  Patient denies visual changes.  Patient reports self foot exams.   Checks BG once daily in the morning before breakfast Reported home fasting blood sugars: gives range in the 120s-140s. Denies any readings > 200.  O:   Lab Results  Component Value Date   HGBA1C 7.5 (A) 12/21/2023   Lipid Panel     Component Value Date/Time   CHOL 136 11/15/2022 1443   TRIG 143 11/15/2022 1443   HDL 49 11/15/2022 1443   CHOLHDL 2.8 11/15/2022 1443   CHOLHDL 3.7 03/01/2016 0956   VLDL 47 (H) 03/01/2016 0956   LDLCALC 62 11/15/2022 1443   Clinical Atherosclerotic Cardiovascular Disease (ASCVD): No  The 10-year ASCVD risk score (Arnett DK, et al., 2019) is: 14.3%   Values used to calculate the score:     Age: 68 years     Clincally relevant sex: Female     Is Non-Hispanic  African American: No     Diabetic: Yes     Tobacco smoker: No     Systolic Blood Pressure: 132 mmHg     Is BP treated: Yes     HDL Cholesterol: 49 mg/dL     Total Cholesterol: 136 mg/dL   A/P: Diabetes longstanding currently close to goal with A1c 7.5%. Reported home sugars look okay. She is not currently symptomatic from a hyper- or hypoglycemia standpoint. Patient is able to verbalize appropriate hypoglycemia management plan. Medication adherence appears appropriate. Advised her to continue current medication regimen and follow-up with her PCP. -Continue Janumet , glipizide  at current doses.  -Extensively discussed pathophysiology of diabetes, recommended lifestyle interventions, dietary effects on blood sugar control -Counseled on s/sx of and management of hypoglycemia -Next A1C anticipated 02/2024.  -Fluzone HD given.   Total time in face to face counseling 30 minutes.  PCP visit: PCP in 01/22/2024.   Herlene Fleeta Morris, PharmD, [name], CPP Clinical Pharmacist [facility] [phone]

## 2023-12-27 LAB — FECAL OCCULT BLOOD, IMMUNOCHEMICAL: Fecal Occult Bld: NEGATIVE

## 2024-01-10 ENCOUNTER — Other Ambulatory Visit: Payer: Self-pay

## 2024-01-12 ENCOUNTER — Other Ambulatory Visit: Payer: Self-pay

## 2024-01-22 ENCOUNTER — Other Ambulatory Visit: Payer: Self-pay

## 2024-01-22 ENCOUNTER — Ambulatory Visit: Payer: Self-pay | Attending: Nurse Practitioner | Admitting: Nurse Practitioner

## 2024-01-22 ENCOUNTER — Encounter: Payer: Self-pay | Admitting: Nurse Practitioner

## 2024-01-22 VITALS — BP 129/75 | HR 70 | Resp 19 | Ht 60.0 in | Wt 172.2 lb

## 2024-01-22 DIAGNOSIS — Z7984 Long term (current) use of oral hypoglycemic drugs: Secondary | ICD-10-CM

## 2024-01-22 DIAGNOSIS — I10 Essential (primary) hypertension: Secondary | ICD-10-CM

## 2024-01-22 DIAGNOSIS — Z7182 Exercise counseling: Secondary | ICD-10-CM

## 2024-01-22 DIAGNOSIS — Z713 Dietary counseling and surveillance: Secondary | ICD-10-CM

## 2024-01-22 DIAGNOSIS — E1165 Type 2 diabetes mellitus with hyperglycemia: Secondary | ICD-10-CM

## 2024-01-22 MED ORDER — SPIRONOLACTONE 25 MG PO TABS
25.0000 mg | ORAL_TABLET | Freq: Every day | ORAL | 1 refills | Status: AC
  Filled 2024-01-22: qty 90, 90d supply, fill #0

## 2024-01-22 MED ORDER — CARVEDILOL 6.25 MG PO TABS
6.2500 mg | ORAL_TABLET | Freq: Two times a day (BID) | ORAL | 3 refills | Status: AC
  Filled 2024-01-22 – 2024-03-11 (×3): qty 180, 90d supply, fill #0

## 2024-01-22 MED ORDER — GLIPIZIDE ER 10 MG PO TB24
20.0000 mg | ORAL_TABLET | Freq: Every day | ORAL | 1 refills | Status: AC
  Filled 2024-01-22 – 2024-03-05 (×2): qty 180, 90d supply, fill #0

## 2024-01-22 NOTE — Progress Notes (Signed)
 Assessment & Plan:  Erin Huff was seen today for hypertension.  Diagnoses and all orders for this visit:  Primary hypertension BP at goal -     spironolactone  (ALDACTONE ) 25 MG tablet; Take 1 tablet (25 mg total) by mouth daily. FOR BLOOD PRESSURE -     carvedilol  (COREG ) 6.25 MG tablet; Take 1 tablet (6.25 mg total) by mouth 2 (two) times daily with a meal. FOR BLOOD PRESSURE Continue all antihypertensives as prescribed.  Reminded to bring in blood pressure log for follow  up appointment.  RECOMMENDATIONS: DASH/Mediterranean Diets are healthier choices for HTN.    Uncontrolled type 2 diabetes mellitus with hyperglycemia, without long-term current use of insulin  (HCC) -     Urine Albumin/Creatinine with ratio (send out) [LAB689] -     glipiZIDE  (GLUCOTROL  XL) 10 MG 24 hr tablet; Take 2 tablets (20 mg total) by mouth daily with breakfast. For DIABETES    Patient has been counseled on age-appropriate routine health concerns for screening and prevention. These are reviewed and up-to-date. Referrals have been placed accordingly. Immunizations are up-to-date or declined.    Subjective:   Chief Complaint  Patient presents with   Hypertension    Erin Huff 68 y.o. female presents to office today for follow up to HTN  VRI was used to communicate directly with patient for the entire encounter including providing detailed patient instructions.    She has a past medical history of Allergy, Depression, Diabetes mellitus without complication, GERD, Bilateral cataracts,, Hyperlipidemia, Hypertension, Moderate to severe aortic stenosis (03/2011), Thoracic aortic aneurysm (04/06/2021), heart murmur and Thyroid  disease.    HTN Blood pressure well-controlled with carvedilol  6.25 mg twice daily and spironolactone  25 mg daily BP Readings from Last 3 Encounters:  01/22/24 129/75  12/12/23 132/70  10/18/23 (!) 149/73     DM 2 We stopped jardiance  due to frequent GU symptoms of  yeast vaginitis.    She has been experiencing intermittent back pain, which she manages with patches and gel that provide relief. No associated chest pain or shortness of breath.    She recently visited an eye doctor who told her she has cataracts and discussed the possibility of surgery. The cost of the surgery was quoted at $10,000, but it is unclear if this amount covers one or both eyes. She is uncertain about her financial assistance or insurance coverage for the procedure. Unfortunately the [facility] financial assistance does not cover cataract surgery.    Review of Systems  Constitutional:  Negative for fever, malaise/fatigue and weight loss.  HENT: Negative.  Negative for nosebleeds.   Eyes:  Positive for blurred vision. Negative for double vision and photophobia.  Respiratory: Negative.  Negative for cough and shortness of breath.   Cardiovascular: Negative.  Negative for chest pain, palpitations and leg swelling.  Gastrointestinal: Negative.  Negative for heartburn, nausea and vomiting.  Musculoskeletal:  Positive for back pain and neck pain. Negative for myalgias.  Neurological: Negative.  Negative for dizziness, focal weakness, seizures and headaches.  Psychiatric/Behavioral: Negative.  Negative for suicidal ideas.     Past Medical History:  Diagnosis Date   Allergy    Depression    mild   Diabetes mellitus without complication (HCC)    GERD (gastroesophageal reflux disease)    Hyperlipidemia    Hypertension    Moderate to severe aortic stenosis 03/2011   Moderate to severe AS noted on echo with mean gradient 27 mmHg.  Also noted moderate dilation of the ascending aorta-46 mm.  Thoracic aortic aneurysm 04/06/2021   Measuring 46 mm on Echo; CTA chest-Aorta 08/10/2021: 4.2 x 4.2 Ascending Aortic dilation => f/u CTA Chest 05/2023:  Aorta diameter 3.8-3.9 cm proximally and 3.9 x 3.5 distally   Thyroid  disease     Past Surgical History:  Procedure Laterality Date   NO PAST  SURGERIES     TRANSTHORACIC ECHOCARDIOGRAM  04/05/2021   EF 60 to 65%.  GR 1 DD.  Normal RV size and mildly elevated RVSP.  Normal RAP.  Moderate to Severe Aortic Stenosis.  Mean gradient 27 mmHg.  Moderate dilation of the ascending aorta measuring 46 mm.  Recommend CTA   TRANSTHORACIC ECHOCARDIOGRAM  03/23/2023   Echocardiogram: LVEF 60-65%, no RWMA.  Mild LVH.  Normal diastolic parameters.  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  Mild ascending aortic dilation-44 mm.  Normal RAP.  Normal RV size and function.    Family History  Problem Relation Age of Onset   Hypertension Sister    Diabetes Brother    Breast cancer Maternal Aunt    Colon cancer Neg Hx    Colon polyps Neg Hx    Esophageal cancer Neg Hx    Rectal cancer Neg Hx    Stomach cancer Neg Hx     Social History Reviewed with no changes to be made today.   Outpatient Medications Prior to Visit  Medication Sig Dispense Refill   Blood Glucose Monitoring Suppl (TRUE METRIX METER) w/Device KIT Use as directed 1 kit 0   Blood Glucose Monitoring Suppl (TRUE METRIX METER) w/Device KIT Use as instructed. Check blood glucose level by fingerstick twice per day.  E11.65 1 kit 0   glucose blood (TRUE METRIX BLOOD GLUCOSE TEST) test strip USE AS INSTRUCTED. 100 strip 3   levothyroxine  (SYNTHROID ) 100 MCG tablet Take 1 tablet (100 mcg total) by mouth daily before breakfast. 90 tablet 2   lidocaine  (LIDODERM ) 5 % Place 1 patch onto the skin daily. Remove & Discard patch within 12 hours or as directed by MD 30 patch 0   omeprazole  (PRILOSEC) 20 MG capsule Take 1 capsule (20 mg total) by mouth daily as needed. PARA ACIDO 90 capsule 1   rosuvastatin  (CRESTOR ) 5 MG tablet Take 1 tablet (5 mg total) by mouth daily. FOR CHOLESTEROL 90 tablet 2   sitaGLIPtin -metformin  (JANUMET ) 50-1000 MG tablet Take 1 tablet by mouth 2 (two) times daily with a meal. 180 tablet 1    triamcinolone  ointment (KENALOG ) 0.5 % Apply 1 Application topically 2 (two) times daily. 60 g 1   TRUEplus Lancets 28G MISC Use as instructed. Check blood glucose level by fingerstick twice per day. E11.65 100 each 5   carvedilol  (COREG ) 6.25 MG tablet Take 1 tablet (6.25 mg total) by mouth 2 (two) times daily with a meal. 180 tablet 3   glipiZIDE  (GLUCOTROL  XL) 10 MG 24 hr tablet Take 2 tablets (20 mg total) by mouth daily with breakfast. 180 tablet 1   spironolactone  (ALDACTONE ) 25 MG tablet Take 1 tablet (25 mg total) by mouth daily. 90 tablet 1   Safety Seal Miscellaneous MISC Melaxemic Cream with tranexamic acid 5% kojic acid USP 2% vit c USP 2.5% hyaluronic acid EXCP 0.1% use on areas twice a day (Patient not taking: Reported on 01/22/2024) 15 g 0   No facility-administered medications prior to visit.    Allergies  Allergen Reactions   Motrin  [Ibuprofen ] Rash  Penicillins Rash       Objective:    BP 129/75 (BP Location: Left Arm, Patient Position: Sitting, Cuff Size: Normal)   Pulse 70   Resp 19   Ht 5' (1.524 m)   Wt 172 lb 3.2 oz (78.1 kg)   SpO2 100%   BMI 33.63 kg/m  Wt Readings from Last 3 Encounters:  01/22/24 172 lb 3.2 oz (78.1 kg)  12/12/23 168 lb (76.2 kg)  10/18/23 165 lb 12.8 oz (75.2 kg)    Physical Exam Vitals and nursing note reviewed.  Constitutional:      Appearance: She is well-developed.  HENT:     Head: Normocephalic and atraumatic.  Cardiovascular:     Rate and Rhythm: Normal rate and regular rhythm.     Heart sounds: Murmur heard.     No friction rub. No gallop.  Pulmonary:     Effort: Pulmonary effort is normal. No tachypnea or respiratory distress.     Breath sounds: Normal breath sounds. No decreased breath sounds, wheezing, rhonchi or rales.  Chest:     Chest wall: No tenderness.  Musculoskeletal:        General: Normal range of motion.     Cervical back: Normal range of motion.  Skin:    General: Skin is warm and dry.   Neurological:     Mental Status: She is alert and oriented to person, place, and time.     Coordination: Coordination normal.  Psychiatric:        Behavior: Behavior normal. Behavior is cooperative.        Thought Content: Thought content normal.        Judgment: Judgment normal.          Patient has been counseled extensively about nutrition and exercise as well as the importance of adherence with medications and regular follow-up. The patient was given clear instructions to go to ER or return to medical center if symptoms don't improve, worsen or new problems develop. The patient verbalized understanding.   Follow-up: Return in about 3 months (around 04/26/2024).   Haze LELON Servant, FNP-BC [facility] [place], [place] [phone]   01/22/2024, 5:45 PM

## 2024-01-23 ENCOUNTER — Ambulatory Visit: Payer: Self-pay | Admitting: Nurse Practitioner

## 2024-01-23 LAB — MICROALBUMIN / CREATININE URINE RATIO
Creatinine, Urine: 29.1 mg/dL
Microalb/Creat Ratio: <10 mg/g{creat} (ref 0–29)
Microalbumin, Urine: <3 ug/mL

## 2024-01-25 ENCOUNTER — Other Ambulatory Visit: Payer: Self-pay

## 2024-01-26 ENCOUNTER — Other Ambulatory Visit: Payer: Self-pay

## 2024-02-05 ENCOUNTER — Other Ambulatory Visit: Payer: Self-pay

## 2024-02-27 ENCOUNTER — Other Ambulatory Visit: Payer: Self-pay | Admitting: Nurse Practitioner

## 2024-02-29 ENCOUNTER — Ambulatory Visit (HOSPITAL_COMMUNITY)
Admission: RE | Admit: 2024-02-29 | Discharge: 2024-02-29 | Disposition: A | Discharge status: Home / Self Care | Payer: Self-pay | Source: Ambulatory Visit | Attending: Cardiovascular Disease | Admitting: Cardiovascular Disease

## 2024-02-29 DIAGNOSIS — I350 Nonrheumatic aortic (valve) stenosis: Secondary | ICD-10-CM | POA: Insufficient documentation

## 2024-02-29 DIAGNOSIS — I77810 Thoracic aortic ectasia: Secondary | ICD-10-CM | POA: Insufficient documentation

## 2024-02-29 LAB — ECHOCARDIOGRAM COMPLETE
AR max vel: 0.63 cm2
AV Area VTI: 0.64 cm2
AV Area mean vel: 0.59 cm2
AV Mean grad: 28 mmHg
AV Peak grad: 42.3 mmHg
Ao pk vel: 3.25 m/s
Area-P 1/2: 3.45 cm2
S' Lateral: 3.1 cm

## 2024-03-01 ENCOUNTER — Ambulatory Visit: Payer: Self-pay | Admitting: Cardiology

## 2024-03-01 NOTE — Progress Notes (Signed)
 Echocardiogram continues to show normal pump function and normal wall motion.  Abnormal relaxation which goes along with having the aortic valve disease.  Continues to have moderate (moderate to severe aortic stenosis with a mean gradient of 28 mm..  (Stable from prior study where it was 29)..  The mild dilation of the aorta is also stable had previous been read at 44 now rate of 42.  We can continue to follow-up every year and make sure nothing is getting worse.  As of right now it seems to be stable.  (Patient will need an interpreter to discuss results)

## 2024-03-05 ENCOUNTER — Other Ambulatory Visit: Payer: Self-pay

## 2024-03-11 ENCOUNTER — Other Ambulatory Visit: Payer: Self-pay

## 2024-03-11 ENCOUNTER — Telehealth: Payer: Self-pay | Admitting: Family Medicine

## 2024-03-11 NOTE — Telephone Encounter (Signed)
 Called patient to verify if she has been seen in [facility] program for Colposcopy and Pap so that we can cancel her visit on 03/14/24 with Dr. Nicholaus. Patient did not answer so I left a detailed message with our call back number so that we can further discuss the necessity of this visit.

## 2024-03-14 ENCOUNTER — Ambulatory Visit: Admitting: Obstetrics and Gynecology

## 2024-04-26 ENCOUNTER — Ambulatory Visit: Admitting: Nurse Practitioner
